# Patient Record
Sex: Female | Born: 1950 | Race: Black or African American | Hispanic: No | Smoking: Current every day smoker
Health system: Southern US, Community
[De-identification: ages and names within clinical notes are randomized; demographics above are authoritative.]

## PROBLEM LIST (undated history)

## (undated) DIAGNOSIS — E039 Hypothyroidism, unspecified: Secondary | ICD-10-CM

## (undated) DIAGNOSIS — M1990 Unspecified osteoarthritis, unspecified site: Secondary | ICD-10-CM

## (undated) DIAGNOSIS — D649 Anemia, unspecified: Secondary | ICD-10-CM

## (undated) DIAGNOSIS — Z8701 Personal history of pneumonia (recurrent): Secondary | ICD-10-CM

## (undated) DIAGNOSIS — J449 Chronic obstructive pulmonary disease, unspecified: Secondary | ICD-10-CM

## (undated) DIAGNOSIS — H269 Unspecified cataract: Secondary | ICD-10-CM

## (undated) DIAGNOSIS — I10 Essential (primary) hypertension: Secondary | ICD-10-CM

## (undated) DIAGNOSIS — K5790 Diverticulosis of intestine, part unspecified, without perforation or abscess without bleeding: Secondary | ICD-10-CM

## (undated) DIAGNOSIS — F329 Major depressive disorder, single episode, unspecified: Secondary | ICD-10-CM

## (undated) DIAGNOSIS — IMO0002 Reserved for concepts with insufficient information to code with codable children: Secondary | ICD-10-CM

## (undated) DIAGNOSIS — I4892 Unspecified atrial flutter: Secondary | ICD-10-CM

## (undated) DIAGNOSIS — K219 Gastro-esophageal reflux disease without esophagitis: Secondary | ICD-10-CM

## (undated) DIAGNOSIS — F32A Depression, unspecified: Secondary | ICD-10-CM

## (undated) DIAGNOSIS — F419 Anxiety disorder, unspecified: Secondary | ICD-10-CM

## (undated) DIAGNOSIS — K648 Other hemorrhoids: Secondary | ICD-10-CM

## (undated) DIAGNOSIS — I4891 Unspecified atrial fibrillation: Secondary | ICD-10-CM

## (undated) HISTORY — DX: Essential (primary) hypertension: I10

## (undated) HISTORY — PX: TUBAL LIGATION: SHX77

## (undated) HISTORY — DX: Chronic obstructive pulmonary disease, unspecified: J44.9

## (undated) HISTORY — PX: HERNIA REPAIR: SHX51

## (undated) HISTORY — DX: Other hemorrhoids: K64.8

## (undated) HISTORY — DX: Personal history of pneumonia (recurrent): Z87.01

## (undated) HISTORY — DX: Depression, unspecified: F32.A

## (undated) HISTORY — DX: Hypothyroidism, unspecified: E03.9

## (undated) HISTORY — DX: Gastro-esophageal reflux disease without esophagitis: K21.9

## (undated) HISTORY — DX: Unspecified osteoarthritis, unspecified site: M19.90

## (undated) HISTORY — DX: Anxiety disorder, unspecified: F41.9

## (undated) HISTORY — DX: Anemia, unspecified: D64.9

## (undated) HISTORY — DX: Diverticulosis of intestine, part unspecified, without perforation or abscess without bleeding: K57.90

## (undated) HISTORY — DX: Major depressive disorder, single episode, unspecified: F32.9

## (undated) HISTORY — PX: CATARACT EXTRACTION W/ INTRAOCULAR LENS IMPLANT: SHX1309

## (undated) HISTORY — PX: PACEMAKER INSERTION: SHX728

## (undated) HISTORY — PX: VAGINAL HYSTERECTOMY: SUR661

---

## 1997-05-25 ENCOUNTER — Ambulatory Visit (HOSPITAL_COMMUNITY): Admission: RE | Admit: 1997-05-25 | Discharge: 1997-05-26 | Payer: Self-pay | Admitting: Surgery

## 1997-07-29 ENCOUNTER — Emergency Department (HOSPITAL_COMMUNITY): Admission: EM | Admit: 1997-07-29 | Discharge: 1997-07-29 | Payer: Self-pay | Admitting: Emergency Medicine

## 1997-12-03 ENCOUNTER — Emergency Department (HOSPITAL_COMMUNITY): Admission: EM | Admit: 1997-12-03 | Discharge: 1997-12-03 | Payer: Self-pay | Admitting: Emergency Medicine

## 1997-12-03 ENCOUNTER — Encounter: Payer: Self-pay | Admitting: Emergency Medicine

## 1998-03-16 ENCOUNTER — Emergency Department (HOSPITAL_COMMUNITY): Admission: EM | Admit: 1998-03-16 | Discharge: 1998-03-16 | Payer: Self-pay | Admitting: Emergency Medicine

## 1998-03-16 ENCOUNTER — Encounter: Payer: Self-pay | Admitting: Emergency Medicine

## 1998-04-23 ENCOUNTER — Emergency Department (HOSPITAL_COMMUNITY): Admission: EM | Admit: 1998-04-23 | Discharge: 1998-04-23 | Payer: Self-pay | Admitting: Emergency Medicine

## 1998-05-19 ENCOUNTER — Emergency Department (HOSPITAL_COMMUNITY): Admission: EM | Admit: 1998-05-19 | Discharge: 1998-05-19 | Payer: Self-pay | Admitting: Emergency Medicine

## 1998-08-16 ENCOUNTER — Emergency Department (HOSPITAL_COMMUNITY): Admission: EM | Admit: 1998-08-16 | Discharge: 1998-08-16 | Payer: Self-pay | Admitting: Emergency Medicine

## 1998-09-06 ENCOUNTER — Emergency Department (HOSPITAL_COMMUNITY): Admission: EM | Admit: 1998-09-06 | Discharge: 1998-09-06 | Payer: Self-pay | Admitting: Internal Medicine

## 1998-09-06 ENCOUNTER — Encounter: Payer: Self-pay | Admitting: Internal Medicine

## 1998-12-08 ENCOUNTER — Emergency Department (HOSPITAL_COMMUNITY): Admission: EM | Admit: 1998-12-08 | Discharge: 1998-12-09 | Payer: Self-pay | Admitting: Emergency Medicine

## 1999-02-28 ENCOUNTER — Emergency Department (HOSPITAL_COMMUNITY): Admission: EM | Admit: 1999-02-28 | Discharge: 1999-02-28 | Payer: Self-pay | Admitting: Emergency Medicine

## 1999-02-28 ENCOUNTER — Encounter: Payer: Self-pay | Admitting: Emergency Medicine

## 1999-06-22 ENCOUNTER — Emergency Department (HOSPITAL_COMMUNITY): Admission: EM | Admit: 1999-06-22 | Discharge: 1999-06-22 | Payer: Self-pay | Admitting: Internal Medicine

## 1999-07-16 ENCOUNTER — Encounter: Payer: Self-pay | Admitting: Emergency Medicine

## 1999-07-16 ENCOUNTER — Emergency Department (HOSPITAL_COMMUNITY): Admission: EM | Admit: 1999-07-16 | Discharge: 1999-07-16 | Payer: Self-pay | Admitting: Emergency Medicine

## 1999-09-03 ENCOUNTER — Emergency Department (HOSPITAL_COMMUNITY): Admission: EM | Admit: 1999-09-03 | Discharge: 1999-09-03 | Payer: Self-pay | Admitting: Emergency Medicine

## 1999-09-03 ENCOUNTER — Encounter: Payer: Self-pay | Admitting: Emergency Medicine

## 2000-03-18 ENCOUNTER — Inpatient Hospital Stay (HOSPITAL_COMMUNITY): Admission: EM | Admit: 2000-03-18 | Discharge: 2000-03-19 | Payer: Self-pay | Admitting: Emergency Medicine

## 2000-03-18 ENCOUNTER — Encounter: Payer: Self-pay | Admitting: Emergency Medicine

## 2000-11-13 ENCOUNTER — Emergency Department (HOSPITAL_COMMUNITY): Admission: EM | Admit: 2000-11-13 | Discharge: 2000-11-14 | Payer: Self-pay | Admitting: Emergency Medicine

## 2000-11-15 ENCOUNTER — Emergency Department (HOSPITAL_COMMUNITY): Admission: EM | Admit: 2000-11-15 | Discharge: 2000-11-15 | Payer: Self-pay

## 2000-12-23 ENCOUNTER — Observation Stay (HOSPITAL_COMMUNITY): Admission: EM | Admit: 2000-12-23 | Discharge: 2000-12-24 | Payer: Self-pay

## 2001-02-24 ENCOUNTER — Emergency Department (HOSPITAL_COMMUNITY): Admission: EM | Admit: 2001-02-24 | Discharge: 2001-02-24 | Payer: Self-pay | Admitting: Emergency Medicine

## 2001-02-24 ENCOUNTER — Encounter: Payer: Self-pay | Admitting: Emergency Medicine

## 2002-07-14 ENCOUNTER — Emergency Department (HOSPITAL_COMMUNITY): Admission: EM | Admit: 2002-07-14 | Discharge: 2002-07-14 | Payer: Self-pay | Admitting: Emergency Medicine

## 2002-07-14 ENCOUNTER — Encounter: Payer: Self-pay | Admitting: Emergency Medicine

## 2002-08-21 ENCOUNTER — Emergency Department (HOSPITAL_COMMUNITY): Admission: EM | Admit: 2002-08-21 | Discharge: 2002-08-21 | Payer: Self-pay | Admitting: Emergency Medicine

## 2002-08-21 ENCOUNTER — Encounter: Payer: Self-pay | Admitting: Emergency Medicine

## 2002-09-29 ENCOUNTER — Emergency Department (HOSPITAL_COMMUNITY): Admission: EM | Admit: 2002-09-29 | Discharge: 2002-09-29 | Payer: Self-pay | Admitting: Emergency Medicine

## 2002-09-29 ENCOUNTER — Encounter: Payer: Self-pay | Admitting: Emergency Medicine

## 2002-10-08 ENCOUNTER — Inpatient Hospital Stay (HOSPITAL_COMMUNITY): Admission: EM | Admit: 2002-10-08 | Discharge: 2002-10-09 | Payer: Self-pay | Admitting: Emergency Medicine

## 2002-10-09 ENCOUNTER — Encounter: Payer: Self-pay | Admitting: Family Medicine

## 2002-10-17 ENCOUNTER — Encounter: Admission: RE | Admit: 2002-10-17 | Discharge: 2002-10-17 | Payer: Self-pay | Admitting: Sports Medicine

## 2003-01-26 ENCOUNTER — Emergency Department (HOSPITAL_COMMUNITY): Admission: EM | Admit: 2003-01-26 | Discharge: 2003-01-27 | Payer: Self-pay | Admitting: Emergency Medicine

## 2003-03-25 ENCOUNTER — Emergency Department (HOSPITAL_COMMUNITY): Admission: EM | Admit: 2003-03-25 | Discharge: 2003-03-25 | Payer: Self-pay | Admitting: Emergency Medicine

## 2003-05-09 ENCOUNTER — Emergency Department (HOSPITAL_COMMUNITY): Admission: EM | Admit: 2003-05-09 | Discharge: 2003-05-09 | Payer: Self-pay | Admitting: Emergency Medicine

## 2003-06-18 ENCOUNTER — Emergency Department (HOSPITAL_COMMUNITY): Admission: EM | Admit: 2003-06-18 | Discharge: 2003-06-18 | Payer: Self-pay | Admitting: Emergency Medicine

## 2003-07-01 ENCOUNTER — Inpatient Hospital Stay (HOSPITAL_COMMUNITY): Admission: EM | Admit: 2003-07-01 | Discharge: 2003-07-01 | Payer: Self-pay | Admitting: *Deleted

## 2003-09-18 ENCOUNTER — Emergency Department (HOSPITAL_COMMUNITY): Admission: EM | Admit: 2003-09-18 | Discharge: 2003-09-18 | Payer: Self-pay | Admitting: Family Medicine

## 2003-11-07 ENCOUNTER — Encounter: Payer: Self-pay | Admitting: Emergency Medicine

## 2003-11-07 ENCOUNTER — Emergency Department (HOSPITAL_COMMUNITY): Admission: EM | Admit: 2003-11-07 | Discharge: 2003-11-07 | Payer: Self-pay | Admitting: Emergency Medicine

## 2004-01-09 ENCOUNTER — Ambulatory Visit: Payer: Self-pay | Admitting: Internal Medicine

## 2004-04-10 ENCOUNTER — Emergency Department (HOSPITAL_COMMUNITY): Admission: EM | Admit: 2004-04-10 | Discharge: 2004-04-10 | Payer: Self-pay | Admitting: Emergency Medicine

## 2004-09-20 ENCOUNTER — Emergency Department (HOSPITAL_COMMUNITY): Admission: EM | Admit: 2004-09-20 | Discharge: 2004-09-21 | Payer: Self-pay | Admitting: Emergency Medicine

## 2005-02-15 ENCOUNTER — Emergency Department (HOSPITAL_COMMUNITY): Admission: EM | Admit: 2005-02-15 | Discharge: 2005-02-15 | Payer: Self-pay | Admitting: *Deleted

## 2005-05-27 ENCOUNTER — Emergency Department (HOSPITAL_COMMUNITY): Admission: EM | Admit: 2005-05-27 | Discharge: 2005-05-27 | Payer: Self-pay | Admitting: Emergency Medicine

## 2005-05-30 ENCOUNTER — Emergency Department (HOSPITAL_COMMUNITY): Admission: EM | Admit: 2005-05-30 | Discharge: 2005-05-30 | Payer: Self-pay | Admitting: Family Medicine

## 2005-07-19 ENCOUNTER — Encounter: Payer: Self-pay | Admitting: Vascular Surgery

## 2005-07-19 ENCOUNTER — Emergency Department (HOSPITAL_COMMUNITY): Admission: EM | Admit: 2005-07-19 | Discharge: 2005-07-19 | Payer: Self-pay | Admitting: Emergency Medicine

## 2005-08-22 ENCOUNTER — Ambulatory Visit: Payer: Self-pay | Admitting: Family Medicine

## 2005-08-25 ENCOUNTER — Ambulatory Visit: Payer: Self-pay | Admitting: *Deleted

## 2005-09-04 ENCOUNTER — Ambulatory Visit: Payer: Self-pay | Admitting: Family Medicine

## 2005-10-07 ENCOUNTER — Observation Stay (HOSPITAL_COMMUNITY): Admission: EM | Admit: 2005-10-07 | Discharge: 2005-10-08 | Payer: Self-pay | Admitting: Emergency Medicine

## 2005-10-07 ENCOUNTER — Ambulatory Visit: Payer: Self-pay | Admitting: Internal Medicine

## 2005-10-08 ENCOUNTER — Ambulatory Visit: Payer: Self-pay | Admitting: Cardiology

## 2005-10-08 ENCOUNTER — Encounter: Payer: Self-pay | Admitting: Cardiology

## 2005-10-21 ENCOUNTER — Ambulatory Visit: Payer: Self-pay | Admitting: Family Medicine

## 2006-02-06 ENCOUNTER — Ambulatory Visit: Payer: Self-pay | Admitting: Internal Medicine

## 2006-03-02 ENCOUNTER — Ambulatory Visit: Payer: Self-pay | Admitting: Internal Medicine

## 2006-03-02 LAB — CONVERTED CEMR LAB
ALT: 14 units/L (ref 0–40)
AST: 17 units/L (ref 0–37)
Albumin: 3.8 g/dL (ref 3.5–5.2)
Alkaline Phosphatase: 76 units/L (ref 39–117)
BUN: 13 mg/dL (ref 6–23)
Basophils Absolute: 0.1 10*3/uL (ref 0.0–0.1)
Basophils Relative: 0.8 % (ref 0.0–1.0)
Bilirubin Urine: NEGATIVE
Bilirubin, Direct: 0.1 mg/dL (ref 0.0–0.3)
CO2: 32 meq/L (ref 19–32)
Calcium: 9.1 mg/dL (ref 8.4–10.5)
Chloride: 101 meq/L (ref 96–112)
Cholesterol: 152 mg/dL (ref 0–200)
Creatinine, Ser: 1 mg/dL (ref 0.4–1.2)
Creatinine,U: 201.9 mg/dL
Eosinophils Absolute: 0.2 10*3/uL (ref 0.0–0.6)
Eosinophils Relative: 2.9 % (ref 0.0–5.0)
GFR calc Af Amer: 74 mL/min
GFR calc non Af Amer: 61 mL/min
Glucose, Bld: 147 mg/dL — ABNORMAL HIGH (ref 70–99)
HCT: 40.3 % (ref 36.0–46.0)
HDL: 46.1 mg/dL (ref >39.0)
Hemoglobin, Urine: NEGATIVE
Hemoglobin: 13.3 g/dL (ref 12.0–15.0)
Hgb A1c MFr Bld: 7.3 % — ABNORMAL HIGH (ref 4.6–6.0)
Ketones, ur: NEGATIVE mg/dL
LDL Cholesterol: 88 mg/dL (ref 0–99)
Leukocytes, UA: NEGATIVE
Lymphocytes Relative: 32.3 % (ref 12.0–46.0)
MCHC: 33.1 g/dL (ref 30.0–36.0)
MCV: 91.3 fL (ref 78.0–100.0)
Microalb Creat Ratio: 15.8 mg/g (ref 0.0–30.0)
Microalb, Ur: 3.2 mg/dL — ABNORMAL HIGH (ref 0.0–1.9)
Monocytes Absolute: 0.5 10*3/uL (ref 0.2–0.7)
Monocytes Relative: 5.5 % (ref 3.0–11.0)
Neutro Abs: 4.9 10*3/uL (ref 1.4–7.7)
Neutrophils Relative %: 58.5 % (ref 43.0–77.0)
Nitrite: NEGATIVE
Platelets: 289 10*3/uL (ref 150–400)
Potassium: 4 meq/L (ref 3.5–5.1)
RBC: 4.41 M/uL (ref 3.87–5.11)
RDW: 14.9 % — ABNORMAL HIGH (ref 11.5–14.6)
Sodium: 140 meq/L (ref 135–145)
Specific Gravity, Urine: 1.02 (ref 1.000–1.03)
TSH: 2.57 microintl units/mL (ref 0.35–5.50)
Total Bilirubin: 0.6 mg/dL (ref 0.3–1.2)
Total CHOL/HDL Ratio: 3.3
Total Protein, Urine: NEGATIVE mg/dL
Total Protein: 7.1 g/dL (ref 6.0–8.3)
Triglycerides: 92 mg/dL (ref 0–149)
Urine Glucose: NEGATIVE mg/dL
Urobilinogen, UA: 0.2 (ref 0.0–1.0)
VLDL: 18 mg/dL (ref 0–40)
WBC: 8.4 10*3/uL (ref 4.5–10.5)
pH: 6 (ref 5.0–8.0)

## 2006-03-06 ENCOUNTER — Ambulatory Visit: Payer: Self-pay | Admitting: Internal Medicine

## 2006-04-06 ENCOUNTER — Ambulatory Visit: Payer: Self-pay | Admitting: Internal Medicine

## 2006-05-05 ENCOUNTER — Ambulatory Visit: Payer: Self-pay | Admitting: Internal Medicine

## 2006-05-21 ENCOUNTER — Ambulatory Visit: Payer: Self-pay | Admitting: Internal Medicine

## 2006-05-21 LAB — CONVERTED CEMR LAB
ALT: 13 units/L (ref 0–40)
AST: 18 units/L (ref 0–37)
Albumin: 4 g/dL (ref 3.5–5.2)
Alkaline Phosphatase: 86 units/L (ref 39–117)
BUN: 9 mg/dL (ref 6–23)
Basophils Absolute: 0 10*3/uL (ref 0.0–0.1)
Basophils Relative: 0.3 % (ref 0.0–1.0)
Bilirubin, Direct: 0.1 mg/dL (ref 0.0–0.3)
CO2: 34 meq/L — ABNORMAL HIGH (ref 19–32)
Calcium: 9.7 mg/dL (ref 8.4–10.5)
Chloride: 103 meq/L (ref 96–112)
Cholesterol: 206 mg/dL (ref 0–200)
Creatinine, Ser: 0.8 mg/dL (ref 0.4–1.2)
Direct LDL: 128.9 mg/dL
Eosinophils Absolute: 0.2 10*3/uL (ref 0.0–0.6)
Eosinophils Relative: 2.3 % (ref 0.0–5.0)
GFR calc Af Amer: 95 mL/min
GFR calc non Af Amer: 79 mL/min
Glucose, Bld: 118 mg/dL — ABNORMAL HIGH (ref 70–99)
HCT: 39.6 % (ref 36.0–46.0)
HDL: 47.7 mg/dL (ref >39.0)
Hemoglobin: 13.3 g/dL (ref 12.0–15.0)
Hgb A1c MFr Bld: 7 %
Hgb A1c MFr Bld: 7 % — ABNORMAL HIGH (ref 4.6–6.0)
Lymphocytes Relative: 28.7 % (ref 12.0–46.0)
MCHC: 33.6 g/dL (ref 30.0–36.0)
MCV: 90.8 fL (ref 78.0–100.0)
Monocytes Absolute: 0.5 10*3/uL (ref 0.2–0.7)
Monocytes Relative: 6.4 % (ref 3.0–11.0)
Neutro Abs: 4.9 10*3/uL (ref 1.4–7.7)
Neutrophils Relative %: 62.3 % (ref 43.0–77.0)
Platelets: 262 10*3/uL (ref 150–400)
Potassium: 4.3 meq/L (ref 3.5–5.1)
RBC: 4.36 M/uL (ref 3.87–5.11)
RDW: 15.5 % — ABNORMAL HIGH (ref 11.5–14.6)
Sodium: 143 meq/L (ref 135–145)
Total Bilirubin: 0.6 mg/dL (ref 0.3–1.2)
Total CHOL/HDL Ratio: 4.3
Total Protein: 7.5 g/dL (ref 6.0–8.3)
Triglycerides: 94 mg/dL (ref 0–149)
VLDL: 19 mg/dL (ref 0–40)
WBC: 7.9 10*3/uL (ref 4.5–10.5)

## 2006-06-02 ENCOUNTER — Ambulatory Visit: Payer: Self-pay | Admitting: Internal Medicine

## 2006-07-01 ENCOUNTER — Ambulatory Visit: Payer: Self-pay | Admitting: Internal Medicine

## 2006-07-29 ENCOUNTER — Ambulatory Visit: Payer: Self-pay | Admitting: Internal Medicine

## 2006-09-05 ENCOUNTER — Emergency Department (HOSPITAL_COMMUNITY): Admission: EM | Admit: 2006-09-05 | Discharge: 2006-09-05 | Payer: Self-pay | Admitting: Emergency Medicine

## 2006-09-16 ENCOUNTER — Ambulatory Visit: Payer: Self-pay | Admitting: Internal Medicine

## 2006-09-17 ENCOUNTER — Encounter: Payer: Self-pay | Admitting: Internal Medicine

## 2006-09-17 DIAGNOSIS — I10 Essential (primary) hypertension: Secondary | ICD-10-CM

## 2006-09-17 DIAGNOSIS — K469 Unspecified abdominal hernia without obstruction or gangrene: Secondary | ICD-10-CM | POA: Insufficient documentation

## 2006-09-17 DIAGNOSIS — G4733 Obstructive sleep apnea (adult) (pediatric): Secondary | ICD-10-CM | POA: Insufficient documentation

## 2006-09-17 DIAGNOSIS — E039 Hypothyroidism, unspecified: Secondary | ICD-10-CM | POA: Insufficient documentation

## 2006-09-17 DIAGNOSIS — Z950 Presence of cardiac pacemaker: Secondary | ICD-10-CM | POA: Insufficient documentation

## 2006-09-17 DIAGNOSIS — M19049 Primary osteoarthritis, unspecified hand: Secondary | ICD-10-CM | POA: Insufficient documentation

## 2006-09-17 DIAGNOSIS — D179 Benign lipomatous neoplasm, unspecified: Secondary | ICD-10-CM | POA: Insufficient documentation

## 2006-09-17 DIAGNOSIS — E1159 Type 2 diabetes mellitus with other circulatory complications: Secondary | ICD-10-CM | POA: Insufficient documentation

## 2006-09-17 DIAGNOSIS — E785 Hyperlipidemia, unspecified: Secondary | ICD-10-CM

## 2006-09-17 DIAGNOSIS — E1169 Type 2 diabetes mellitus with other specified complication: Secondary | ICD-10-CM | POA: Insufficient documentation

## 2006-09-17 DIAGNOSIS — I498 Other specified cardiac arrhythmias: Secondary | ICD-10-CM | POA: Insufficient documentation

## 2006-09-18 ENCOUNTER — Encounter: Payer: Self-pay | Admitting: Internal Medicine

## 2006-09-22 ENCOUNTER — Ambulatory Visit: Payer: Self-pay | Admitting: Internal Medicine

## 2006-10-07 ENCOUNTER — Encounter (INDEPENDENT_AMBULATORY_CARE_PROVIDER_SITE_OTHER): Payer: Self-pay | Admitting: *Deleted

## 2006-11-17 ENCOUNTER — Ambulatory Visit: Payer: Self-pay | Admitting: Internal Medicine

## 2006-12-15 ENCOUNTER — Ambulatory Visit: Payer: Self-pay | Admitting: Internal Medicine

## 2006-12-17 ENCOUNTER — Emergency Department (HOSPITAL_COMMUNITY): Admission: EM | Admit: 2006-12-17 | Discharge: 2006-12-17 | Payer: Self-pay | Admitting: Emergency Medicine

## 2006-12-25 ENCOUNTER — Ambulatory Visit: Payer: Self-pay | Admitting: Internal Medicine

## 2006-12-25 DIAGNOSIS — M79609 Pain in unspecified limb: Secondary | ICD-10-CM | POA: Insufficient documentation

## 2006-12-25 DIAGNOSIS — F329 Major depressive disorder, single episode, unspecified: Secondary | ICD-10-CM | POA: Insufficient documentation

## 2006-12-31 LAB — CONVERTED CEMR LAB
BUN: 15 mg/dL (ref 6–23)
CO2: 34 meq/L — ABNORMAL HIGH (ref 19–32)
Calcium: 9.9 mg/dL (ref 8.4–10.5)
Chloride: 99 meq/L (ref 96–112)
Cholesterol: 198 mg/dL (ref 0–200)
Creatinine, Ser: 1.1 mg/dL (ref 0.4–1.2)
GFR calc Af Amer: 66 mL/min
GFR calc non Af Amer: 55 mL/min
Glucose, Bld: 86 mg/dL (ref 70–99)
HDL: 47 mg/dL (ref >39.0)
Hgb A1c MFr Bld: 7.6 % — ABNORMAL HIGH (ref 4.6–6.0)
LDL Cholesterol: 129 mg/dL — ABNORMAL HIGH (ref 0–99)
Potassium: 3.4 meq/L — ABNORMAL LOW (ref 3.5–5.1)
Sodium: 140 meq/L (ref 135–145)
Total CHOL/HDL Ratio: 4.2
Triglycerides: 110 mg/dL (ref 0–149)
VLDL: 22 mg/dL (ref 0–40)

## 2007-01-08 ENCOUNTER — Telehealth: Payer: Self-pay | Admitting: Internal Medicine

## 2007-01-11 ENCOUNTER — Telehealth: Payer: Self-pay | Admitting: Internal Medicine

## 2007-01-12 ENCOUNTER — Ambulatory Visit: Payer: Self-pay | Admitting: Internal Medicine

## 2007-01-27 ENCOUNTER — Ambulatory Visit: Payer: Self-pay | Admitting: Internal Medicine

## 2007-02-09 ENCOUNTER — Ambulatory Visit: Payer: Self-pay | Admitting: Internal Medicine

## 2007-02-25 ENCOUNTER — Telehealth (INDEPENDENT_AMBULATORY_CARE_PROVIDER_SITE_OTHER): Payer: Self-pay | Admitting: *Deleted

## 2007-03-26 ENCOUNTER — Telehealth: Payer: Self-pay | Admitting: Internal Medicine

## 2007-04-26 ENCOUNTER — Telehealth (INDEPENDENT_AMBULATORY_CARE_PROVIDER_SITE_OTHER): Payer: Self-pay | Admitting: *Deleted

## 2007-05-11 ENCOUNTER — Ambulatory Visit: Payer: Self-pay | Admitting: Internal Medicine

## 2007-05-11 ENCOUNTER — Encounter: Payer: Self-pay | Admitting: Internal Medicine

## 2007-05-14 ENCOUNTER — Encounter: Payer: Self-pay | Admitting: Internal Medicine

## 2007-05-19 ENCOUNTER — Ambulatory Visit: Payer: Self-pay | Admitting: Internal Medicine

## 2007-05-19 LAB — CONVERTED CEMR LAB
BUN: 11 mg/dL (ref 6–23)
Basophils Absolute: 0 10*3/uL (ref 0.0–0.1)
Basophils Relative: 0.3 % (ref 0.0–1.0)
CO2: 33 meq/L — ABNORMAL HIGH (ref 19–32)
Calcium: 9.2 mg/dL (ref 8.4–10.5)
Chloride: 103 meq/L (ref 96–112)
Creatinine, Ser: 0.8 mg/dL (ref 0.4–1.2)
Eosinophils Absolute: 0.2 10*3/uL (ref 0.0–0.7)
Eosinophils Relative: 2.4 % (ref 0.0–5.0)
GFR calc Af Amer: 95 mL/min
GFR calc non Af Amer: 79 mL/min
Glucose, Bld: 108 mg/dL — ABNORMAL HIGH (ref 70–99)
HCT: 39.8 % (ref 36.0–46.0)
Hemoglobin: 12.8 g/dL (ref 12.0–15.0)
INR: 1 (ref 0.8–1.0)
Lymphocytes Relative: 29.7 % (ref 12.0–46.0)
MCHC: 32.1 g/dL (ref 30.0–36.0)
MCV: 92.7 fL (ref 78.0–100.0)
Monocytes Absolute: 0.6 10*3/uL (ref 0.1–1.0)
Monocytes Relative: 7.3 % (ref 3.0–12.0)
Neutro Abs: 5 10*3/uL (ref 1.4–7.7)
Neutrophils Relative %: 60.3 % (ref 43.0–77.0)
Platelets: 251 10*3/uL (ref 150–400)
Potassium: 3.6 meq/L (ref 3.5–5.1)
Prothrombin Time: 12.2 s (ref 10.9–13.3)
RBC: 4.29 M/uL (ref 3.87–5.11)
RDW: 15.1 % — ABNORMAL HIGH (ref 11.5–14.6)
Sodium: 141 meq/L (ref 135–145)
WBC: 8.2 10*3/uL (ref 4.5–10.5)
aPTT: 30.6 s — ABNORMAL HIGH (ref 21.7–29.8)

## 2007-05-25 ENCOUNTER — Ambulatory Visit (HOSPITAL_COMMUNITY): Admission: RE | Admit: 2007-05-25 | Discharge: 2007-05-25 | Payer: Self-pay | Admitting: Internal Medicine

## 2007-05-25 ENCOUNTER — Ambulatory Visit: Payer: Self-pay | Admitting: Internal Medicine

## 2007-06-01 ENCOUNTER — Ambulatory Visit: Payer: Self-pay | Admitting: Internal Medicine

## 2007-06-17 ENCOUNTER — Telehealth (INDEPENDENT_AMBULATORY_CARE_PROVIDER_SITE_OTHER): Payer: Self-pay | Admitting: *Deleted

## 2007-06-17 DIAGNOSIS — M25569 Pain in unspecified knee: Secondary | ICD-10-CM | POA: Insufficient documentation

## 2007-07-27 ENCOUNTER — Encounter: Payer: Self-pay | Admitting: Internal Medicine

## 2007-08-26 ENCOUNTER — Emergency Department (HOSPITAL_COMMUNITY): Admission: EM | Admit: 2007-08-26 | Discharge: 2007-08-27 | Payer: Self-pay | Admitting: Emergency Medicine

## 2007-09-03 ENCOUNTER — Ambulatory Visit: Payer: Self-pay

## 2007-09-10 ENCOUNTER — Encounter: Payer: Self-pay | Admitting: Internal Medicine

## 2007-09-14 ENCOUNTER — Ambulatory Visit: Payer: Self-pay | Admitting: Internal Medicine

## 2007-09-16 ENCOUNTER — Encounter: Payer: Self-pay | Admitting: Internal Medicine

## 2007-11-17 ENCOUNTER — Encounter: Admission: RE | Admit: 2007-11-17 | Discharge: 2007-11-17 | Payer: Self-pay | Admitting: Internal Medicine

## 2007-11-26 ENCOUNTER — Encounter: Admission: RE | Admit: 2007-11-26 | Discharge: 2007-11-26 | Payer: Self-pay | Admitting: Internal Medicine

## 2007-12-17 ENCOUNTER — Telehealth: Payer: Self-pay | Admitting: Internal Medicine

## 2007-12-28 ENCOUNTER — Ambulatory Visit: Payer: Self-pay | Admitting: Vascular Surgery

## 2007-12-28 ENCOUNTER — Encounter (INDEPENDENT_AMBULATORY_CARE_PROVIDER_SITE_OTHER): Payer: Self-pay | Admitting: Emergency Medicine

## 2007-12-28 ENCOUNTER — Emergency Department (HOSPITAL_COMMUNITY): Admission: EM | Admit: 2007-12-28 | Discharge: 2007-12-28 | Payer: Self-pay | Admitting: Emergency Medicine

## 2008-02-13 ENCOUNTER — Emergency Department (HOSPITAL_COMMUNITY): Admission: EM | Admit: 2008-02-13 | Discharge: 2008-02-13 | Payer: Self-pay | Admitting: Emergency Medicine

## 2008-02-16 ENCOUNTER — Ambulatory Visit: Payer: Self-pay | Admitting: Internal Medicine

## 2008-02-16 DIAGNOSIS — R109 Unspecified abdominal pain: Secondary | ICD-10-CM | POA: Insufficient documentation

## 2008-02-17 LAB — CONVERTED CEMR LAB
ALT: 16 units/L (ref 0–35)
AST: 22 units/L (ref 0–37)
Albumin: 3.9 g/dL (ref 3.5–5.2)
Alkaline Phosphatase: 74 units/L (ref 39–117)
BUN: 16 mg/dL (ref 6–23)
Basophils Absolute: 0 10*3/uL (ref 0.0–0.1)
Basophils Relative: 0.4 % (ref 0.0–3.0)
Bilirubin Urine: NEGATIVE
Bilirubin, Direct: 0.1 mg/dL (ref 0.0–0.3)
CO2: 35 meq/L — ABNORMAL HIGH (ref 19–32)
Calcium: 9.7 mg/dL (ref 8.4–10.5)
Chloride: 104 meq/L (ref 96–112)
Cholesterol: 178 mg/dL (ref 0–200)
Creatinine, Ser: 0.9 mg/dL (ref 0.4–1.2)
Creatinine,U: 141.7 mg/dL
Eosinophils Absolute: 0.4 10*3/uL (ref 0.0–0.7)
Eosinophils Relative: 3.8 % (ref 0.0–5.0)
GFR calc Af Amer: 83 mL/min
GFR calc non Af Amer: 68 mL/min
Glucose, Bld: 83 mg/dL (ref 70–99)
HCT: 39.9 % (ref 36.0–46.0)
HDL: 49.3 mg/dL (ref >39.0)
Hemoglobin, Urine: NEGATIVE
Hemoglobin: 13.5 g/dL (ref 12.0–15.0)
Hgb A1c MFr Bld: 6.9 % — ABNORMAL HIGH (ref 4.6–6.0)
Ketones, ur: NEGATIVE mg/dL
LDL Cholesterol: 95 mg/dL (ref 0–99)
Leukocytes, UA: NEGATIVE
Lymphocytes Relative: 36.4 % (ref 12.0–46.0)
MCHC: 33.8 g/dL (ref 30.0–36.0)
MCV: 93.3 fL (ref 78.0–100.0)
Microalb Creat Ratio: 15.5 mg/g (ref 0.0–30.0)
Microalb, Ur: 2.2 mg/dL — ABNORMAL HIGH (ref 0.0–1.9)
Monocytes Absolute: 0.7 10*3/uL (ref 0.1–1.0)
Monocytes Relative: 7.2 % (ref 3.0–12.0)
Neutro Abs: 5.3 10*3/uL (ref 1.4–7.7)
Neutrophils Relative %: 52.2 % (ref 43.0–77.0)
Nitrite: NEGATIVE
Platelets: 244 10*3/uL (ref 150–400)
Potassium: 4 meq/L (ref 3.5–5.1)
RBC: 4.28 M/uL (ref 3.87–5.11)
RDW: 15.1 % — ABNORMAL HIGH (ref 11.5–14.6)
Sodium: 144 meq/L (ref 135–145)
Specific Gravity, Urine: >=1.03 (ref 1.000–1.03)
TSH: 5.63 microintl units/mL — ABNORMAL HIGH (ref 0.35–5.50)
Total Bilirubin: 0.5 mg/dL (ref 0.3–1.2)
Total CHOL/HDL Ratio: 3.6
Total Protein, Urine: NEGATIVE mg/dL
Total Protein: 7.3 g/dL (ref 6.0–8.3)
Triglycerides: 167 mg/dL — ABNORMAL HIGH (ref 0–149)
Urine Glucose: NEGATIVE mg/dL
Urobilinogen, UA: 0.2 (ref 0.0–1.0)
VLDL: 33 mg/dL (ref 0–40)
WBC: 10 10*3/uL (ref 4.5–10.5)
pH: 5 (ref 5.0–8.0)

## 2008-02-18 LAB — CONVERTED CEMR LAB: Vit D, 1,25-Dihydroxy: 21 — ABNORMAL LOW (ref 30–89)

## 2008-05-10 ENCOUNTER — Ambulatory Visit: Payer: Self-pay | Admitting: Internal Medicine

## 2008-05-10 DIAGNOSIS — J209 Acute bronchitis, unspecified: Secondary | ICD-10-CM | POA: Insufficient documentation

## 2008-05-22 ENCOUNTER — Telehealth: Payer: Self-pay | Admitting: Internal Medicine

## 2008-06-19 ENCOUNTER — Encounter (INDEPENDENT_AMBULATORY_CARE_PROVIDER_SITE_OTHER): Payer: Self-pay | Admitting: Emergency Medicine

## 2008-06-19 ENCOUNTER — Ambulatory Visit: Payer: Self-pay | Admitting: Vascular Surgery

## 2008-06-19 ENCOUNTER — Emergency Department (HOSPITAL_COMMUNITY): Admission: EM | Admit: 2008-06-19 | Discharge: 2008-06-19 | Payer: Self-pay | Admitting: Emergency Medicine

## 2008-06-30 ENCOUNTER — Telehealth: Payer: Self-pay | Admitting: Internal Medicine

## 2008-07-10 ENCOUNTER — Ambulatory Visit: Payer: Self-pay | Admitting: Internal Medicine

## 2008-07-10 DIAGNOSIS — M109 Gout, unspecified: Secondary | ICD-10-CM | POA: Insufficient documentation

## 2008-07-10 DIAGNOSIS — M25579 Pain in unspecified ankle and joints of unspecified foot: Secondary | ICD-10-CM | POA: Insufficient documentation

## 2008-07-24 ENCOUNTER — Telehealth: Payer: Self-pay | Admitting: Family Medicine

## 2008-07-25 ENCOUNTER — Ambulatory Visit: Payer: Self-pay | Admitting: Internal Medicine

## 2008-07-31 ENCOUNTER — Telehealth: Payer: Self-pay | Admitting: Internal Medicine

## 2008-08-09 ENCOUNTER — Ambulatory Visit: Payer: Self-pay | Admitting: Internal Medicine

## 2008-08-09 LAB — CONVERTED CEMR LAB
BUN: 13 mg/dL (ref 6–23)
CO2: 35 meq/L — ABNORMAL HIGH (ref 19–32)
Calcium: 9.3 mg/dL (ref 8.4–10.5)
Chloride: 103 meq/L (ref 96–112)
Cholesterol: 158 mg/dL (ref 0–200)
Creatinine, Ser: 0.8 mg/dL (ref 0.4–1.2)
GFR calc non Af Amer: 94.56 mL/min (ref >60)
Glucose, Bld: 109 mg/dL — ABNORMAL HIGH (ref 70–99)
HDL: 55 mg/dL (ref >39.00)
Hgb A1c MFr Bld: 7 % — ABNORMAL HIGH (ref 4.6–6.5)
LDL Cholesterol: 89 mg/dL (ref 0–99)
Potassium: 3.9 meq/L (ref 3.5–5.1)
Sodium: 143 meq/L (ref 135–145)
Total CHOL/HDL Ratio: 3
Triglycerides: 69 mg/dL (ref 0.0–149.0)
VLDL: 13.8 mg/dL (ref 0.0–40.0)

## 2008-08-14 ENCOUNTER — Ambulatory Visit: Payer: Self-pay | Admitting: Internal Medicine

## 2008-08-17 ENCOUNTER — Telehealth (INDEPENDENT_AMBULATORY_CARE_PROVIDER_SITE_OTHER): Payer: Self-pay | Admitting: *Deleted

## 2008-08-17 DIAGNOSIS — K1370 Unspecified lesions of oral mucosa: Secondary | ICD-10-CM | POA: Insufficient documentation

## 2008-08-31 ENCOUNTER — Telehealth (INDEPENDENT_AMBULATORY_CARE_PROVIDER_SITE_OTHER): Payer: Self-pay | Admitting: *Deleted

## 2008-08-31 ENCOUNTER — Telehealth: Payer: Self-pay | Admitting: Internal Medicine

## 2008-09-08 ENCOUNTER — Telehealth: Payer: Self-pay | Admitting: Internal Medicine

## 2008-09-21 ENCOUNTER — Telehealth: Payer: Self-pay | Admitting: Internal Medicine

## 2008-10-20 ENCOUNTER — Telehealth: Payer: Self-pay | Admitting: Internal Medicine

## 2008-11-22 ENCOUNTER — Ambulatory Visit: Payer: Self-pay | Admitting: Internal Medicine

## 2008-11-29 ENCOUNTER — Encounter: Payer: Self-pay | Admitting: Internal Medicine

## 2008-12-01 ENCOUNTER — Emergency Department (HOSPITAL_COMMUNITY): Admission: EM | Admit: 2008-12-01 | Discharge: 2008-12-01 | Payer: Self-pay | Admitting: Emergency Medicine

## 2008-12-26 ENCOUNTER — Emergency Department (HOSPITAL_COMMUNITY): Admission: EM | Admit: 2008-12-26 | Discharge: 2008-12-27 | Payer: Self-pay | Admitting: Emergency Medicine

## 2009-05-03 ENCOUNTER — Ambulatory Visit: Payer: Self-pay | Admitting: Internal Medicine

## 2009-09-04 ENCOUNTER — Telehealth (INDEPENDENT_AMBULATORY_CARE_PROVIDER_SITE_OTHER): Payer: Self-pay | Admitting: *Deleted

## 2009-09-22 ENCOUNTER — Emergency Department (HOSPITAL_COMMUNITY): Admission: EM | Admit: 2009-09-22 | Discharge: 2009-09-22 | Payer: Self-pay | Admitting: Emergency Medicine

## 2009-09-27 ENCOUNTER — Emergency Department (HOSPITAL_COMMUNITY)
Admission: EM | Admit: 2009-09-27 | Discharge: 2009-09-27 | Payer: Self-pay | Source: Home / Self Care | Admitting: Emergency Medicine

## 2009-10-23 ENCOUNTER — Telehealth: Payer: Self-pay | Admitting: Internal Medicine

## 2009-11-12 ENCOUNTER — Telehealth: Payer: Self-pay | Admitting: Internal Medicine

## 2009-12-28 ENCOUNTER — Telehealth: Payer: Self-pay | Admitting: Internal Medicine

## 2010-01-19 ENCOUNTER — Emergency Department (HOSPITAL_COMMUNITY)
Admission: EM | Admit: 2010-01-19 | Discharge: 2010-01-19 | Payer: Self-pay | Source: Home / Self Care | Admitting: Emergency Medicine

## 2010-01-22 ENCOUNTER — Telehealth: Payer: Self-pay | Admitting: Internal Medicine

## 2010-02-10 ENCOUNTER — Encounter: Payer: Self-pay | Admitting: Internal Medicine

## 2010-02-17 ENCOUNTER — Emergency Department (HOSPITAL_COMMUNITY)
Admission: EM | Admit: 2010-02-17 | Discharge: 2010-02-17 | Payer: Self-pay | Source: Home / Self Care | Admitting: Emergency Medicine

## 2010-02-19 NOTE — Assessment & Plan Note (Signed)
Summary: rov/ pt has no insurance / gd   Visit Type:  Follow-up Primary Provider:  Corwin Levins MD   History of Present Illness: [name] returns today followup.  She is a very pleasant middle-aged woman with a history of symptomatic bradycardia and high-grade heart block who is status post pacemaker insertion.  She returns today for followup. She had initially experienced pain at her insertion site which has resolved.  She now notes occaisional palpitations and also some stiffness and swelling in her joints in her hands, fingers and feet which improves as the day goes along.  She has not tolerated any NSAIDS.  Current Medications (verified): 1)  Carvedilol 25 Mg Tabs (Carvedilol) .Marland Kitchen.. 1po Two Times A Day/ Out 2)  Glimepiride 4 Mg Tabs (Glimepiride) .... Take 1 Tablet By Mouth Twice A Day 3)  Lisinopril-Hydrochlorothiazide 20-12.5 Mg Tabs (Lisinopril-Hydrochlorothiazide) .... Take 2 Tablet By Mouth Once A Day 4)  Pravastatin Sodium 40 Mg Tabs (Pravastatin Sodium) .... Take 2 Tablet By Mouth Once A Day 5)  Sertraline Hcl 100 Mg  Tabs (Sertraline Hcl) .Marland Kitchen.. 1 and 1/2 By Mouth Once Daily 6)  Onetouch Test   Strp (Glucose Blood) .... Use As Directed 7)  Onetouch Finepoint Lancets   Misc (Lancets) .... Use As Directed 8)  Metformin Hcl 500 Mg  Tabs (Metformin Hcl) .Marland Kitchen.. 1 By Mouth Two Times A Day 9)  Clonidine Hcl 0.1 Mg Tabs (Clonidine Hcl) .... One Tab Twice A Day 10)  Klor-Con M20 20 Meq Cr-Tabs (Potassium Chloride Crys Cr) .... Take 1 Two Times A Day  Allergies: 1)  ! Penicillin V Potassium (Penicillin V Potassium) 2)  ! Nsaids 3)  ! Darvocet-N 100 4)  ! Septra Ds (Sulfamethoxazole-Trimethoprim) 5)  ! Vicodin 6)  Codeine Phosphate (Codeine Phosphate) 7)  Naprosyn (Naproxen)  Past History:  Past Medical History: Last updated: 07/10/2008 ABDOMINAL PAIN, UNSPECIFIED SITE (ICD-789.00) PREVENTIVE HEALTH CARE (ICD-V70.0) KNEE PAIN, BILATERAL (ICD-719.46) LEG PAIN, BILATERAL  (ICD-729.5) DEPRESSIVE DISORDER (ICD-311) THUMB PAIN (ICD-729.5) HERNIA, SITE NOS W/O OBSTRUCTION/GANGRENE (ICD-553.9) LIPOMA NOS (ICD-214.9) DEGENERATIVE JOINT DISEASE, HANDS (ICD-715.94) OBSTRUCTIVE SLEEP APNEA (ICD-327.23) HYPERLIPIDEMIA (ICD-272.4) HYPOTHYROIDISM (ICD-244.9) PACEMAKER, PERMANENT (ICD-V45.01) BRADYCARDIA, CHRONIC (ICD-427.89) HYPERTENSION (ICD-401.9) DIABETES MELLITUS, TYPE II (ICD-250.00) Gout  Past Surgical History: Last updated: 04/22/2008 Hysterectomy hernia repair '73  Removal of previous implanted pacemaker, which is   a ERI, insertion of a new dual-chamber pacemaker with pacemaker pocket   revision.  05/25/2007  Doylene Canning. Ladona Ridgel, MD   Review of Systems  The patient denies chest pain, syncope, and dyspnea on exertion.    Vital Signs:  Patient profile:   60 year old female Height:      65 inches Weight:      229 pounds O2 Sat:      97 % Pulse rate:   77 / minute BP sitting:   124 / 88  (left arm)  Vitals Entered By: Laurance Flatten CMA (May 03, 2009 11:27 AM) Comments Pt has been out of carvedilol for a month., due to a mix-up at her pharmacy.   Physical Exam  General:  alert and overweight-appearing.   Head:  normocephalic and atraumatic.   Eyes:  vision grossly intact, pupils equal, and pupils round.   Mouth:  no gingival abnormalities and pharynx pink and moist.   Neck:  supple and no masses.   Chest Wall:  Well healed PPM incision. Lungs:  normal respiratory effort and normal breath sounds.   Heart:  normal rate and regular  rhythm.   Abdomen:  Bowel sounds positive,abdomen soft and non-tender without masses, organomegaly or hernias noted. Msk:  right ankle and medial foot with marked tender and swelling to the left lat mallealar area, also the tarsal tunnel area Pulses:  1+ bilat dorsalis pedis Extremities:  no edema, no erythema  Neurologic:  strength normal in all extremities and sensation intact to light touch.     PPM  Specifications Following MD:  Lewayne Bunting, MD     PPM Vendor:  Medtronic     PPM Model Number:  [id number]     PPM Serial Number:  [id number] PPM DOI:  05/25/2007     PPM Implanting MD:  Lewayne Bunting, MD  Lead 1    Location: RA     DOI: 05/25/1997     Model #: [id number]     Serial #: [id number]     Status: active Lead 2    Location: RV     DOI: 05/25/1997     Model #: [id number]     Serial #: [id number]     Status: active  Magnet Response Rate:  BOL 85 ERI 65   Indications:  BRADYCARDIA   PPM Follow Up Remote Check?  No Battery Voltage:  2.79 V     Battery Est. Longevity:  14 YEARS     Pacer Dependent:  No       PPM Device Measurements Atrium  Amplitude: 2.8 mV, Impedance: 621 ohms, Threshold: 1.125 V at 0.4 msec Right Ventricle  Amplitude: 22.4 mV, Impedance: 662 ohms, Threshold: 0.875 V at 0.4 msec  Episodes MS Episodes:  263     Percent Mode Switch:  <0.1%     Coumadin:  No Ventricular High Rate:  1     Atrial Pacing:  15.4%     Ventricular Pacing:  0.6%  Parameters Mode:  MVP(R)     Lower Rate Limit:  60     Upper Rate Limit:  130 Paced AV Delay:  150     Sensed AV Delay:  120 Next Cardiology Appt Due:  10/20/2009 Tech Comments:  Normal device function.  No changes made today.  All mode switch episodes less than 1 minute except for 2- these are likely SVT with 1:1 conduction.  VHR episode is run of NSVT.  ROV 6 months clinic. Gypsy Balsam RN BSN  May 03, 2009 11:38 AM  MD Comments:  Agree with above.  Impression & Recommendations:  Problem # 1:  PACEMAKER, PERMANENT (ICD-V45.01) Her device is working normally.  Will recheck in several months.  Problem # 2:  HYPERTENSION (ICD-401.9) She has been off of her beta blocker which we will restart today.  Continue meds as below and maintain a low sodium diet. Her updated medication list for this problem includes:    Carvedilol 25 Mg Tabs (Carvedilol) .Marland Kitchen... 1po two times a day/ out    Lisinopril-hydrochlorothiazide 20-12.5 Mg Tabs  (Lisinopril-hydrochlorothiazide) .Marland Kitchen... Take 2 tablet by mouth once a day    Clonidine Hcl 0.1 Mg Tabs (Clonidine hcl) ..... One tab twice a day  Problem # 3:  HYPERLIPIDEMIA (ICD-272.4) Continue a low fat diet.  Continue current meds. Her updated medication list for this problem includes:    Pravastatin Sodium 40 Mg Tabs (Pravastatin sodium) .Marland Kitchen... Take 2 tablet by mouth once a day  Patient Instructions: 1)  Your physician wants you to follow-up in: 6 months with device clinic.  You will receive a reminder letter in the mail two months in  advance. If you don't receive a letter, please call our office to schedule the follow-up appointment. Prescriptions: CARVEDILOL 25 MG TABS (CARVEDILOL) 1po two times a day/ out  #60 x 11   Entered by:   Proofreader   Authorized by:   Laren Boom, MD, [facility]   Signed by:   Gypsy Balsam RN BSN on 05/03/2009   Method used:   Electronically to        [facility] [phone]* (retail)       [address]       [place], [place]  [address]       Ph: [phone]       Fax: [phone]   RxID:   [id number]

## 2010-02-19 NOTE — Miscellaneous (Signed)
Summary: DEVICE PRELOAD  Clinical Lists Changes  Observations: Added new observation of PPM INDICATN: BRADYCARDIA (02/16/2008 12:23) Added new observation of MAGNET RTE: BOL 85 ERI 65  (02/16/2008 12:23) Added new observation of PPMLEADSTAT2: active (02/16/2008 12:23) Added new observation of PPMLEADSER2: [id number] (02/16/2008 12:23) Added new observation of PPMLEADMOD2: 5092  (02/16/2008 12:23) Added new observation of PPMLEADDOI2: 05/25/1997  (02/16/2008 12:23) Added new observation of PPMLEADLOC2: RV  (02/16/2008 12:23) Added new observation of PPMLEADSTAT1: active  (02/16/2008 12:23) Added new observation of PPMLEADSER1: [id number] V  (02/16/2008 12:23) Added new observation of PPMLEADMOD1: 4068  (02/16/2008 12:23) Added new observation of PPMLEADDOI1: 05/25/1997  (02/16/2008 12:23) Added new observation of PPMLEADLOC1: RA  (02/16/2008 12:23) Added new observation of PPM IMP MD: Lewayne Bunting, MD  (02/16/2008 12:23) Added new observation of PPM DOI: 05/25/2007  (02/16/2008 12:23) Added new observation of PPM SERL#: [id number]  (02/16/2008 12:23) Added new observation of PPM MODL#: ADDRL1  (02/16/2008 29:52) Added new observation of PACEMAKERMFG: Medtronic  (02/16/2008 12:23) Added new observation of PACEMAKER MD: Lewayne Bunting, MD  (02/16/2008 12:23)      PPM Specifications Following MD:  Lewayne Bunting, MD     PPM Vendor:  Medtronic     PPM Model Number:  ADDRL1     PPM Serial Number:  [id number] PPM DOI:  05/25/2007     PPM Implanting MD:  Lewayne Bunting, MD   Lead 1    Location: RA     DOI: 05/25/1997     Model #: [id number]     Serial #: [id number]     Status: active Lead 2    Location: RV     DOI: 05/25/1997     Model #: [id number]     Serial #: [id number]     Status: active  Magnet Response Rate:  BOL 85 ERI 65   Indications:  BRADYCARDIA

## 2010-02-19 NOTE — Progress Notes (Signed)
Summary: setraline hcl  Phone Note Refill Request Message from:  Fax from Pharmacy on December 17, 2007 8:08 AM  Refills Requested: Medication #1:  SERTRALINE HCL 100 MG  TABS 1 and 1/2 by mouth qd   Dosage confirmed as above?Dosage Confirmed   Brand Name Necessary? No   Last Refilled: 11/16/2007   Notes: 45tabs x 11 Next Appointment Scheduled: non Initial call taken by: Windell Norfolk,  December 17, 2007 8:08 AM    New/Updated Medications: SERTRALINE HCL 100 MG  TABS (SERTRALINE HCL) 1 and 1/2 by mouth once daily   Prescriptions: SERTRALINE HCL 100 MG  TABS (SERTRALINE HCL) 1 and 1/2 by mouth once daily  #45 x 5   Entered and Authorized by:   Corwin Levins MD   Signed by:   Corwin Levins MD on 12/17/2007   Method used:   Electronically to        [facility]* (retail)       [address]       [place], [place]  [address]       Ph: [phone]       Fax: [phone]   RxID:   [id number]

## 2010-02-19 NOTE — Progress Notes (Signed)
Summary: PERCOCET rx//new f/u  Phone Note Call from Patient Call back at Home Phone [phone]   Caller: Patient/[phone] Call For: dr Jonny Ruiz Summary of Call: per pt call need a refill on medication PERCOCET 10-325 MG  TABS (OXYCODONE-ACETAMINOPHEN) 1po qid prn Initial call taken by: Shelbie Proctor,  Jun 17, 2007 9:09 AM  Follow-up for Phone Call        knee films were nearly normal last visit - will change percocet to tramadol for now and refer to ortho - rx done escript Follow-up by: Corwin Levins MD,  Jun 17, 2007 9:58 AM  Additional Follow-up for Phone Call Additional follow up Details #1::        Jun 17, 2007 10:44 AM called pt t to inform .  pt states she cant take the tramadol mentioned this at the last ov thats why dr Jonny Ruiz prescribe the percocet Jun 17, 2007 11:03 AM pt called back and also stated the pain meds were for her hands the referral to the to orth will it be for for the pain in her hands as well?  Additional Follow-up by: Shelbie Proctor,  Jun 17, 2007 10:45 AM  New Problems: KNEE PAIN, BILATERAL (ICD-719.46)   Additional Follow-up for Phone Call Additional follow up Details #2::    no - percocet is not approp for her type of pain in the knees; needs to see ortho only for the knees Follow-up by: Corwin Levins MD,  Jun 17, 2007 11:26 AM  Additional Follow-up for Phone Call Additional follow up Details #3:: Details for Additional Follow-up Action Taken: Jun 17, 2007 11:46 AM called pt to inform pt made aware  Jun 17, 2007 3:51 PM pt wanted to be refered to another  ortho dr , pt owe money was told to handle her bill with the orth dr before appt to be made  Additional Follow-up by: Shelbie Proctor,  Jun 17, 2007 11:46 AM  New Problems: KNEE PAIN, BILATERAL (ICD-719.46) New/Updated Medications: TRAMADOL HCL 50 MG  TABS (TRAMADOL HCL) 1 -2 by mouth q 6 hrs as needed pain   Prescriptions: TRAMADOL HCL 50 MG  TABS (TRAMADOL HCL) 1 -2 by mouth q 6 hrs as needed  pain  #240 x 2   Entered and Authorized by:   Corwin Levins MD   Signed by:   Corwin Levins MD on 06/17/2007   Method used:   Electronically sent to ...       [address]*       [address]       [place], [place]  [address]       Ph: [phone]       Fax: [phone]   RxID:   [id number]

## 2010-02-19 NOTE — Letter (Signed)
Summary: Physicians Orders for Implantable Device Insertion  Physicians Orders for Implantable Device Insertion   Imported By: Maryln Gottron 05/25/2007 14:07:04  _____________________________________________________________________  External Attachment:    Type:   Image     Comment:   External Document

## 2010-02-19 NOTE — Progress Notes (Signed)
Summary: klor con  Phone Note Call from Patient   Caller: Patient Summary of Call: Was seen in ER on 06/19/08. Potassium was low they rx a 10 day supply until she have f/u appt with Dr. Jonny Ruiz. Pt states due to financial reason she have'nt schedule f/u/ yet. she is out of med              Initial call taken by: Orlan Leavens,  June 30, 2008 1:17 PM  Follow-up for Phone Call        Called pt back need f/u appt with dr. Jonny Ruiz. need to have potassium check. can only call in enough pill until appt. Pt made appt fo 07/10/08. [facility] Follow-up by: Orlan Leavens,  June 30, 2008 2:49 PM    New/Updated Medications: KLOR-CON M20 20 MEQ CR-TABS (POTASSIUM CHLORIDE CRYS CR) take 1 two times a day   Prescriptions: KLOR-CON M20 20 MEQ CR-TABS (POTASSIUM CHLORIDE CRYS CR) take 1 two times a day  #60 x 0   Entered by:   Orlan Leavens   Authorized by:   Corwin Levins MD   Signed by:   Orlan Leavens on 06/30/2008   Method used:   Electronically to        [facility] [address] [phone]* (retail)       [address]       [place], [place]  [address]       Ph: [phone] or [phone]       Fax: [phone]   RxID:   [id number]

## 2010-02-19 NOTE — Progress Notes (Signed)
Summary: RF  Phone Note Refill Request Call back at Home Phone [phone]   Refills Requested: Medication #1:  OXYCODONE HCL 5 MG TABS 1 by mouth a 6 hrs as needed pain - to fill august 15 Initial call taken by: Lamar Sprinkles, CMA,  September 21, 2008 4:26 PM  Follow-up for Phone Call        done hardcopy to LIM side B - dahlia  Follow-up by: Corwin Levins MD,  September 21, 2008 4:31 PM  Additional Follow-up for Phone Call Additional follow up Details #1::        pt aware rx up front to pick up Additional Follow-up by: Margaret Pyle, CMA,  September 21, 2008 4:42 PM    New/Updated Medications: OXYCODONE HCL 5 MG TABS (OXYCODONE HCL) 1 by mouth a 6 hrs as needed pain - to fill sept 3, 2010 Prescriptions: OXYCODONE HCL 5 MG TABS (OXYCODONE HCL) 1 by mouth a 6 hrs as needed pain - to fill sept 3, 2010  #120 x 0   Entered and Authorized by:   Corwin Levins MD   Signed by:   Corwin Levins MD on 09/21/2008   Method used:   Print then Give to Patient   RxID:   [id number]

## 2010-02-19 NOTE — Assessment & Plan Note (Signed)
Summary: FU ON MEDS/GOUT? /NWS/ PER TRIAGE   Vital Signs:  Patient profile:   60 year old female Height:      65.5 inches Weight:      240.56 pounds O2 Sat:      96 % Temp:     98.6 degrees F oral Pulse rate:   76 / minute BP sitting:   164 / 100  (left arm) Cuff size:   large  Vitals Entered By: Windell Norfolk (July 10, 2008 4:48 PM) CC: swollen right foot   CC:  swollen right foot.  History of Present Illness: here with 2 wks onset right ankle pain without obvious trauma or twisting; pain quite severe to stand and assoc with moderate swelling some involving the dorsum of the foot as well, thankfully has been able to work sitting for the most part instead of the usual standing, denies fever, but quite tender as well; was seen in the ER with normal uric acid and neg LE venous dopplers, tx wtih narcotic pills that have now run out; does have hx of gout in the big toe years ago; overal denies polys or low sugars, good compliance with meds, no CP, sob, doe, wheezing, orhtopnea, pnd or LE edema  Problems Prior to Update: 1)  Lipoma  (ICD-214.9) 2)  Ankle Pain, Right  (ICD-719.47) 3)  Gout  (ICD-274.9) 4)  Asthmatic Bronchitis, Acute  (ICD-466.0) 5)  Abdominal Pain, Unspecified Site  (ICD-789.00) 6)  Preventive Health Care  (ICD-V70.0) 7)  Knee Pain, Bilateral  (ICD-719.46) 8)  Leg Pain, Bilateral  (ICD-729.5) 9)  Depressive Disorder  (ICD-311) 10)  Thumb Pain  (ICD-729.5) 11)  Hernia, Site Nos w/o Obstruction/gangrene  (ICD-553.9) 12)  Lipoma Nos  (ICD-214.9) 13)  Degenerative Joint Disease, Hands  (ICD-715.94) 14)  Obstructive Sleep Apnea  (ICD-327.23) 15)  Hyperlipidemia  (ICD-272.4) 16)  Hypothyroidism  (ICD-244.9) 17)  Pacemaker, Permanent  (ICD-V45.01) 18)  Bradycardia, Chronic  (ICD-427.89) 19)  Hypertension  (ICD-401.9) 20)  Diabetes Mellitus, Type II  (ICD-250.00)  Medications Prior to Update: 1)  Carvedilol 6.25 Mg Tabs (Carvedilol) .... Take 1 Tablet By Mouth Twice A  Day 2)  Glimepiride 4 Mg Tabs (Glimepiride) .... Take 1 Tablet By Mouth Twice A Day 3)  Levothyroxine Sodium 50 Mcg Tabs (Levothyroxine Sodium) .... Take 1 Tablet By Mouth Once A Day 4)  Lisinopril-Hydrochlorothiazide 20-12.5 Mg Tabs (Lisinopril-Hydrochlorothiazide) .... Take 2 Tablet By Mouth Once A Day 5)  Pravastatin Sodium 40 Mg Tabs (Pravastatin Sodium) .... Take 2 Tablet By Mouth Once A Day 6)  Sertraline Hcl 100 Mg  Tabs (Sertraline Hcl) .Marland Kitchen.. 1 and 1/2 By Mouth Once Daily 7)  Onetouch Test   Strp (Glucose Blood) .... Use As Directed 8)  Onetouch Finepoint Lancets   Misc (Lancets) .... Use As Directed 9)  Metformin Hcl 500 Mg  Tabs (Metformin Hcl) .Marland Kitchen.. 1 By Mouth Two Times A Day 10)  Clonidine Hcl 0.1 Mg Tabs (Clonidine Hcl) .... One Tab Twice A Day 11)  Azithromycin 250 Mg Tabs (Azithromycin) .... 2po Qd For 1 Day, Then 1po Qd For 4days, Then Stop 12)  Promethazine-Codeine 6.25-10 Mg/52ml Syrp (Promethazine-Codeine) .Marland Kitchen.. 1 Tsp By Mouth Q 6 Hrs As Needed Cough 13)  Prednisone 10 Mg Tabs (Prednisone) .... 3po Qd For 3days, Then 2po Qd For 3days, Then 1po Qd For 3days, Then Stop 14)  Proair Hfa 108 (90 Base) Mcg/act Aers (Albuterol Sulfate) .... 2 Puffs Four Times Per Day As Needed For Shortness of Breath  15)  Klor-Con M20 20 Meq Cr-Tabs (Potassium Chloride Crys Cr) .... Take 1 Two Times A Day  Current Medications (verified): 1)  Carvedilol 6.25 Mg Tabs (Carvedilol) .... Take 1 Tablet By Mouth Twice A Day 2)  Glimepiride 4 Mg Tabs (Glimepiride) .... Take 1 Tablet By Mouth Twice A Day 3)  Levothyroxine Sodium 50 Mcg Tabs (Levothyroxine Sodium) .... Take 1 Tablet By Mouth Once A Day 4)  Lisinopril-Hydrochlorothiazide 20-12.5 Mg Tabs (Lisinopril-Hydrochlorothiazide) .... Take 2 Tablet By Mouth Once A Day 5)  Pravastatin Sodium 40 Mg Tabs (Pravastatin Sodium) .... Take 2 Tablet By Mouth Once A Day 6)  Sertraline Hcl 100 Mg  Tabs (Sertraline Hcl) .Marland Kitchen.. 1 and 1/2 By Mouth Once Daily 7)  Onetouch  Test   Strp (Glucose Blood) .... Use As Directed 8)  Onetouch Finepoint Lancets   Misc (Lancets) .... Use As Directed 9)  Metformin Hcl 500 Mg  Tabs (Metformin Hcl) .Marland Kitchen.. 1 By Mouth Two Times A Day 10)  Clonidine Hcl 0.1 Mg Tabs (Clonidine Hcl) .... One Tab Twice A Day 11)  Klor-Con M20 20 Meq Cr-Tabs (Potassium Chloride Crys Cr) .... Take 1 Two Times A Day 12)  Prednisone 10 Mg Tabs (Prednisone) .... 4po Qd For 3days, Then 3po Qd For 3days, Then 2po Qd For 3days, Then 1po Qd For 3 Days, Then Stop 13)  Oxycodone Hcl 5 Mg Tabs (Oxycodone Hcl) .Marland Kitchen.. 1 By Mouth A 6 Hrs As Needed Pain 14)  Indomethacin 50 Mg Caps (Indomethacin) .Marland Kitchen.. 1 By Mouth Three Times A Day As Needed  Allergies (verified): 1)  ! Penicillin V Potassium (Penicillin V Potassium) 2)  ! Nsaids 3)  ! Darvocet-N 100 (Propoxyphene N-Apap) 4)  ! Septra Ds (Sulfamethoxazole-Trimethoprim) 5)  ! Vicodin 6)  Codeine Phosphate (Codeine Phosphate) 7)  Naprosyn (Naproxen)  Past History:  Past Surgical History: Last updated: 04/22/2008 Hysterectomy hernia repair '73  Removal of previous implanted pacemaker, which is   a ERI, insertion of a new dual-chamber pacemaker with pacemaker pocket   revision.  05/25/2007  Doylene Canning. 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  Risk Factors: Smoking Status: current (01/27/2007)  Past Medical History: ABDOMINAL PAIN, UNSPECIFIED SITE (ICD-789.00) PREVENTIVE HEALTH CARE (ICD-V70.0) KNEE PAIN, BILATERAL (ICD-719.46) LEG PAIN, BILATERAL (ICD-729.5) DEPRESSIVE DISORDER (ICD-311) THUMB PAIN (ICD-729.5) HERNIA, SITE NOS W/O OBSTRUCTION/GANGRENE (ICD-553.9) LIPOMA NOS (ICD-214.9) DEGENERATIVE JOINT DISEASE, HANDS (ICD-715.94) OBSTRUCTIVE SLEEP APNEA (ICD-327.23) HYPERLIPIDEMIA (ICD-272.4) HYPOTHYROIDISM (ICD-244.9) PACEMAKER, PERMANENT (ICD-V45.01) BRADYCARDIA, CHRONIC (ICD-427.89) HYPERTENSION (ICD-401.9) DIABETES  MELLITUS, TYPE II (ICD-250.00) Gout  Review of Systems       all otherwise negative except for lump to post neck becoming larger but nontender  Physical Exam  General:  alert and overweight-appearing.   Head:  normocephalic and atraumatic.   Eyes:  vision grossly intact, pupils equal, and pupils round.   Ears:  R ear normal and L ear normal.   Nose:  no external deformity and no nasal discharge.   Mouth:  no gingival abnormalities and pharynx pink and moist.   Neck:  supple and no masses.   Lungs:  normal respiratory effort and normal breath sounds.   Heart:  normal rate and regular rhythm.   Msk:  righ ankle with mod tedner effusion of the right ankle with some slight overall decreased ROM, no significant erythema, no ulceration; some sweling involves the dorsum of the right foot only; dorsalis pedis 1+ bilat Extremities:  no edema, no ulcers  except for the above Skin:  large lipoma like structure to post neck base approx 6 cm   Impression & Recommendations:  Problem # 1:  ANKLE PAIN, RIGHT (ICD-719.47)  with effusion - diff includes gout vs fracture vs djd - tx with depo shot, short term prednisone, check xray, and indocin as needed future attacks; has job where she can sit to take wt off  Orders: T-Ankle Comp Right (73610TC)  Problem # 2:  DIABETES MELLITUS, TYPE II (ICD-250.00)  Her updated medication list for this problem includes:    Glimepiride 4 Mg Tabs (Glimepiride) .Marland Kitchen... Take 1 tablet by mouth twice a day    Lisinopril-hydrochlorothiazide 20-12.5 Mg Tabs (Lisinopril-hydrochlorothiazide) .Marland Kitchen... Take 2 tablet by mouth once a day    Metformin Hcl 500 Mg Tabs (Metformin hcl) .Marland Kitchen... 1 by mouth two times a day  Labs Reviewed: Creat: 0.9 (02/16/2008)    Reviewed HgBA1c results: 6.9 (02/16/2008)  7.6 (12/25/2006) pt is warned of the blood sugars becoming mildly elvated due to tx, to call for cbg > 250  Problem # 3:  HYPERTENSION (ICD-401.9)  Her updated medication list  for this problem includes:    Carvedilol 6.25 Mg Tabs (Carvedilol) .Marland Kitchen... Take 1 tablet by mouth twice a day    Lisinopril-hydrochlorothiazide 20-12.5 Mg Tabs (Lisinopril-hydrochlorothiazide) .Marland Kitchen... Take 2 tablet by mouth once a day    Clonidine Hcl 0.1 Mg Tabs (Clonidine hcl) ..... One tab twice a day mild elev likely situatinoal, ok to follow  Problem # 4:  LIPOMA (ICD-214.9)  large post cervical at base of neck - refer gen surgury  Orders: Surgical Referral (Surgery)  Complete Medication List: 1)  Carvedilol 6.25 Mg Tabs (Carvedilol) .... Take 1 tablet by mouth twice a day 2)  Glimepiride 4 Mg Tabs (Glimepiride) .... Take 1 tablet by mouth twice a day 3)  Levothyroxine Sodium 50 Mcg Tabs (Levothyroxine sodium) .... Take 1 tablet by mouth once a day 4)  Lisinopril-hydrochlorothiazide 20-12.5 Mg Tabs (Lisinopril-hydrochlorothiazide) .... Take 2 tablet by mouth once a day 5)  Pravastatin Sodium 40 Mg Tabs (Pravastatin sodium) .... Take 2 tablet by mouth once a day 6)  Sertraline Hcl 100 Mg Tabs (Sertraline hcl) .Marland Kitchen.. 1 and 1/2 by mouth once daily 7)  Onetouch Test Strp (Glucose blood) .... Use as directed 8)  Onetouch Finepoint Lancets Misc (Lancets) .... Use as directed 9)  Metformin Hcl 500 Mg Tabs (Metformin hcl) .Marland Kitchen.. 1 by mouth two times a day 10)  Clonidine Hcl 0.1 Mg Tabs (Clonidine hcl) .... One tab twice a day 11)  Klor-con M20 20 Meq Cr-tabs (Potassium chloride crys cr) .... Take 1 two times a day 12)  Prednisone 10 Mg Tabs (Prednisone) .... 4po qd for 3days, then 3po qd for 3days, then 2po qd for 3days, then 1po qd for 3 days, then stop 13)  Oxycodone Hcl 5 Mg Tabs (Oxycodone hcl) .Marland Kitchen.. 1 by mouth a 6 hrs as needed pain 14)  Indomethacin 50 Mg Caps (Indomethacin) .Marland Kitchen.. 1 by mouth three times a day as needed  Other Orders: Admin of Therapeutic Inj  intramuscular or subcutaneous (62952) Depo- Medrol 80mg  (J1040)  Patient Instructions: 1)  you had the steroid shot today 2)   Please take all new medications as prescribed, except only use the indomethacin for FUTURE attacks (not this one) 3)  Please go to Radiology in the basement level for your X-Ray today  4)  Continue all medications that you may have been taking previously You will be  contacted about the referral(s) to: Surgury for the lump on the back 5)  followup as we discussed at your last office visit Prescriptions: INDOMETHACIN 50 MG CAPS (INDOMETHACIN) 1 by mouth three times a day as needed  #90 x 1   Entered and Authorized by:   Corwin Levins MD   Signed by:   Corwin Levins MD on 07/10/2008   Method used:   Print then Give to Patient   RxID:   [id number] OXYCODONE HCL 5 MG TABS (OXYCODONE HCL) 1 by mouth a 6 hrs as needed pain  #60 x 0   Entered and Authorized by:   Corwin Levins MD   Signed by:   Corwin Levins MD on 07/10/2008   Method used:   Print then Give to Patient   RxID:   [id number] PREDNISONE 10 MG TABS (PREDNISONE) 4po qd for 3days, then 3po qd for 3days, then 2po qd for 3days, then 1po qd for 3 days, then stop  #30 x 0   Entered and Authorized by:   Corwin Levins MD   Signed by:   Corwin Levins MD on 07/10/2008   Method used:   Print then Give to Patient   RxID:   [id number]    Medication Administration  Injection # 1:    Medication: Depo- Medrol 80mg     Diagnosis: ASTHMATIC BRONCHITIS, ACUTE (ICD-466.0)    Route: IM    Site: LUOQ gluteus    Exp Date: 06/2010    Lot #: [id number]    Mfr: [employer]    Comments: Depo Medrol 120 LUOQ    Patient tolerated injection without complications    Given by: Ami Bullins CMA (July 10, 2008 5:18 PM)  Orders Added: 1)  Surgical Referral [Surgery] 2)  Admin of Therapeutic Inj  intramuscular or subcutaneous [96372] 3)  Depo- Medrol 80mg  [J1040] 4)  T-Ankle Comp Right [73610TC] 5)  Est. Patient Level IV [40347]

## 2010-02-19 NOTE — Progress Notes (Signed)
Summary: see note about how to reach pt  Phone Note Call from Patient Call back at Home Phone [phone]   Caller: [phone]/ Call For: [facility] Reason for Call: Referral Summary of Call: PER .PT CALL HAVE A SPOT INSIDE HER MOUTH  WHITE .  NO PAIN. WANT TO BE REFFERED TO ENT . ITS SIZE OF  ERASER forgot to mentioned this  while she was here to see dr Jonny Ruiz Initial call taken by: Shelbie Proctor,  August 17, 2008 10:37 AM  Follow-up for Phone Call        ok for referral - oral lesion - I will do Follow-up by: Corwin Levins MD,  August 17, 2008 12:54 PM  Additional Follow-up for Phone Call Additional follow up Details #1::        called pt  to inform about referral will be done [facility]) pt want to be  called at this number [phone]/neighbor may lvg msg with them .Marland Kitchen..or call work [phone] Additional Follow-up by: Shelbie Proctor,  August 17, 2008 2:34 PM  New Problems: OTHER&UNSPECIFIED DISEASES THE ORAL SOFT TISSUES (ICD-528.9)   Additional Follow-up for Phone Call Additional follow up Details #2::    Called pt at the [phone] number, Lm w/person, awaiting return call. Also called wk# said she was not working today 08/17/08. Follow-up by: Dagoberto Reef,  August 17, 2008 3:22 PM  New Problems: OTHER&UNSPECIFIED DISEASES THE ORAL SOFT TISSUES (ICD-528.9)

## 2010-02-19 NOTE — Progress Notes (Signed)
  Phone Note Refill Request Message from:  Fax from Pharmacy on December 28, 2009 12:14 PM  Refills Requested: Medication #1:  LISINOPRIL-HYDROCHLOROTHIAZIDE 20-12.5 MG TABS Take 2 tablet by mouth once a day   Dosage confirmed as above?Dosage Confirmed   Last Refilled: 10/23/2009   Notes: [facility]  Medication #2:  METFORMIN HCL 500 MG  TABS 1 by mouth two times a day   Dosage confirmed as above?Dosage Confirmed   Last Refilled: 11/23/2009   Notes: [facility] Initial call taken by: Robin Ewing CMA (AAMA),  December 28, 2009 12:15 PM    Prescriptions: METFORMIN HCL 500 MG  TABS (METFORMIN HCL) 1 by mouth two times a day  #60 x 0   Entered by:   Scharlene Gloss CMA (AAMA)   Authorized by:   Corwin Levins MD   Signed by:   Scharlene Gloss CMA (AAMA) on 12/28/2009   Method used:   Faxed to ...       [facility] (retail)       [address]       [place], [place]  [id number]       Ph: [phone]       Fax: [phone]   RxID:   [id number] LISINOPRIL-HYDROCHLOROTHIAZIDE 20-12.5 MG TABS (LISINOPRIL-HYDROCHLOROTHIAZIDE) Take 2 tablet by mouth once a day  #60 Each x 0   Entered by:   Scharlene Gloss CMA (AAMA)   Authorized by:   Corwin Levins MD   Signed by:   Scharlene Gloss CMA (AAMA) on 12/28/2009   Method used:   Faxed to ...       [facility] (retail)       [address]       [place], [place]  [id number]       Ph: [phone]       Fax: [phone]   RxID:   [id number]

## 2010-02-19 NOTE — Progress Notes (Signed)
  Phone Note Call from Patient Call back at Home Phone [phone]   Caller: Patient Call For: dr Jonny Ruiz Summary of Call: per pt taking hydrocodone medication is making her gage. need one touch ultra strips called into [facility] [phone] [place] Initial call taken by: Shelbie Proctor,  January 08, 2007 11:52 AM  Follow-up for Phone Call        ok to take 1/2 pill to see if can tolerate better., o/w routine refills to [facility] Follow-up by: Corwin Levins MD,  January 08, 2007 1:18 PM    New/Updated Medications: Letta Pate TEST   STRP (GLUCOSE BLOOD)    Prescriptions: ONETOUCH TEST   STRP (GLUCOSE BLOOD)   #1 x 5   Entered by:   Maris Berger   Authorized by:   Corwin Levins MD   Signed by:   Maris Berger on 01/08/2007   Method used:   Electronically sent to ...       [facility]  [address]       [address]       [place], [place]  [address]       Ph: [phone]       Fax: [phone]   RxID:   [id number]     Appended Document:  pt called back and stated no one called in her refill and did not tell her what to do about the med making her gague i inform pt of the msg regarding the medication hydrocodone and what dr Jonny Ruiz advise her to do. i resent ann the refill note that she needed once again.pt stated she would like an call from the nurse just to inform when meds are called in ,been without since friday

## 2010-02-19 NOTE — Assessment & Plan Note (Signed)
Summary: 6 mos f/u $50/cd   Vital Signs:  Patient profile:   60 year old female Height:      65.5 inches Weight:      232 pounds BMI:     38.16 O2 Sat:      93 % Temp:     98.3 degrees F oral Pulse rate:   87 / minute BP sitting:   158 / 110  (left arm) Cuff size:   large  Vitals Entered By: Windell Norfolk (August 14, 2008 4:08 PM) CC: 6 month f/u Comments pt states no longer takes levothyroxine   CC:  6 month f/u.  History of Present Illness: overall doing well, no major new complaints; wt overall mostly stable and hard to lose though she is tyring to follow the diet and be more active;  denies polys or low sugars, good compliacne with meds; denies CP, sob, doe, orthopnea, pnd or worsening LE edema, also nbo new neuro sympotms such as HA, facial or extremity weakness; no apparetn med side effects such as myalgias or arthralgias, dizziness, or syncope  Problems Prior to Update: 1)  Other&unspecified Diseases The Oral Soft Tissues  (ICD-528.9) 2)  Lipoma  (ICD-214.9) 3)  Ankle Pain, Right  (ICD-719.47) 4)  Gout  (ICD-274.9) 5)  Asthmatic Bronchitis, Acute  (ICD-466.0) 6)  Abdominal Pain, Unspecified Site  (ICD-789.00) 7)  Preventive Health Care  (ICD-V70.0) 8)  Knee Pain, Bilateral  (ICD-719.46) 9)  Leg Pain, Bilateral  (ICD-729.5) 10)  Depressive Disorder  (ICD-311) 11)  Thumb Pain  (ICD-729.5) 12)  Hernia, Site Nos w/o Obstruction/gangrene  (ICD-553.9) 13)  Lipoma Nos  (ICD-214.9) 14)  Degenerative Joint Disease, Hands  (ICD-715.94) 15)  Obstructive Sleep Apnea  (ICD-327.23) 16)  Hyperlipidemia  (ICD-272.4) 17)  Hypothyroidism  (ICD-244.9) 18)  Pacemaker, Permanent  (ICD-V45.01) 19)  Bradycardia, Chronic  (ICD-427.89) 20)  Hypertension  (ICD-401.9) 21)  Diabetes Mellitus, Type II  (ICD-250.00)  Medications Prior to Update: 1)  Carvedilol 12.5 Mg Tabs (Carvedilol) .... Take One Tablet By Mouth Twice A Day 2)  Glimepiride 4 Mg Tabs (Glimepiride) .... Take 1 Tablet By  Mouth Twice A Day 3)  Levothyroxine Sodium 50 Mcg Tabs (Levothyroxine Sodium) .... Take 1 Tablet By Mouth Once A Day 4)  Lisinopril-Hydrochlorothiazide 20-12.5 Mg Tabs (Lisinopril-Hydrochlorothiazide) .... Take 2 Tablet By Mouth Once A Day 5)  Pravastatin Sodium 40 Mg Tabs (Pravastatin Sodium) .... Take 2 Tablet By Mouth Once A Day 6)  Sertraline Hcl 100 Mg  Tabs (Sertraline Hcl) .Marland Kitchen.. 1 and 1/2 By Mouth Once Daily 7)  Onetouch Test   Strp (Glucose Blood) .... Use As Directed 8)  Onetouch Finepoint Lancets   Misc (Lancets) .... Use As Directed 9)  Metformin Hcl 500 Mg  Tabs (Metformin Hcl) .Marland Kitchen.. 1 By Mouth Two Times A Day 10)  Clonidine Hcl 0.1 Mg Tabs (Clonidine Hcl) .... One Tab Twice A Day 11)  Klor-Con M20 20 Meq Cr-Tabs (Potassium Chloride Crys Cr) .... Take 1 Two Times A Day 12)  Oxycodone Hcl 5 Mg Tabs (Oxycodone Hcl) .Marland Kitchen.. 1 By Mouth A 6 Hrs As Needed Pain 13)  Indomethacin 50 Mg Caps (Indomethacin) .Marland Kitchen.. 1 By Mouth Three Times A Day As Needed 14)  Azithromycin 250 Mg Tabs (Azithromycin) .... 2po Qd For 1 Day, Then 1po Qd For 4days, Then Stop  Current Medications (verified): 1)  Carvedilol 25 Mg Tabs (Carvedilol) .Marland Kitchen.. 1po Two Times A Day 2)  Glimepiride 4 Mg Tabs (Glimepiride) .... Take 1 Tablet  By Mouth Twice A Day 3)  Lisinopril-Hydrochlorothiazide 20-12.5 Mg Tabs (Lisinopril-Hydrochlorothiazide) .... Take 2 Tablet By Mouth Once A Day 4)  Pravastatin Sodium 40 Mg Tabs (Pravastatin Sodium) .... Take 2 Tablet By Mouth Once A Day 5)  Sertraline Hcl 100 Mg  Tabs (Sertraline Hcl) .Marland Kitchen.. 1 and 1/2 By Mouth Once Daily 6)  Onetouch Test   Strp (Glucose Blood) .... Use As Directed 7)  Onetouch Finepoint Lancets   Misc (Lancets) .... Use As Directed 8)  Metformin Hcl 500 Mg  Tabs (Metformin Hcl) .Marland Kitchen.. 1 By Mouth Two Times A Day 9)  Clonidine Hcl 0.1 Mg Tabs (Clonidine Hcl) .... One Tab Twice A Day 10)  Klor-Con M20 20 Meq Cr-Tabs (Potassium Chloride Crys Cr) .... Take 1 Two Times A Day 11)   Oxycodone Hcl 5 Mg Tabs (Oxycodone Hcl) .Marland Kitchen.. 1 By Mouth A 6 Hrs As Needed Pain 12)  Indomethacin 50 Mg Caps (Indomethacin) .Marland Kitchen.. 1 By Mouth Three Times A Day As Needed  Allergies (verified): 1)  ! Penicillin V Potassium (Penicillin V Potassium) 2)  ! Nsaids 3)  ! Darvocet-N 100 (Propoxyphene N-Apap) 4)  ! Septra Ds (Sulfamethoxazole-Trimethoprim) 5)  ! Vicodin 6)  Codeine Phosphate (Codeine Phosphate) 7)  Naprosyn (Naproxen)  Past History:  Past Medical History: Last updated: 07/10/2008 ABDOMINAL PAIN, UNSPECIFIED SITE (ICD-789.00) PREVENTIVE HEALTH CARE (ICD-V70.0) KNEE PAIN, BILATERAL (ICD-719.46) LEG PAIN, BILATERAL (ICD-729.5) DEPRESSIVE DISORDER (ICD-311) THUMB PAIN (ICD-729.5) HERNIA, SITE NOS W/O OBSTRUCTION/GANGRENE (ICD-553.9) LIPOMA NOS (ICD-214.9) DEGENERATIVE JOINT DISEASE, HANDS (ICD-715.94) OBSTRUCTIVE SLEEP APNEA (ICD-327.23) HYPERLIPIDEMIA (ICD-272.4) HYPOTHYROIDISM (ICD-244.9) PACEMAKER, PERMANENT (ICD-V45.01) BRADYCARDIA, CHRONIC (ICD-427.89) HYPERTENSION (ICD-401.9) DIABETES MELLITUS, TYPE II (ICD-250.00) Gout  Past Surgical History: Last updated: 04/22/2008 Hysterectomy hernia repair '73  Removal of previous implanted pacemaker, which is   a ERI, insertion of a new dual-chamber pacemaker with pacemaker pocket   revision.  05/25/2007  Doylene Canning. 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   Head:  normocephalic and atraumatic.   Eyes:  vision grossly intact, pupils equal, and pupils round.   Ears:  R ear normal and L ear normal.   Nose:  no external deformity and no nasal discharge.   Mouth:  no gingival abnormalities and pharynx pink and moist.   Neck:  supple and no masses.   Lungs:  normal respiratory effort  and normal breath sounds.   Heart:  normal rate and regular rhythm.   Extremities:  no edema, no ulcers    Impression & Recommendations:  Problem # 1:  DIABETES MELLITUS, TYPE II (ICD-250.00)  Her updated medication list for this problem includes:    Glimepiride 4 Mg Tabs (Glimepiride) .Marland Kitchen... Take 1 tablet by mouth twice a day    Lisinopril-hydrochlorothiazide 20-12.5 Mg Tabs (Lisinopril-hydrochlorothiazide) .Marland Kitchen... Take 2 tablet by mouth once a day    Metformin Hcl 500 Mg Tabs (Metformin hcl) .Marland Kitchen... 1 by mouth two times a day  Labs Reviewed: Creat: 0.8 (08/09/2008)    Reviewed HgBA1c results: 7.0 (08/09/2008)  6.9 (02/16/2008) stable overall by hx and exam, ok to continue meds/tx as is   Problem # 2:  HYPERTENSION (ICD-401.9) Assessment: Deteriorated  Her updated medication list for this problem includes:    Carvedilol 25 Mg Tabs (Carvedilol) .Marland Kitchen... 1po two times a day  Lisinopril-hydrochlorothiazide 20-12.5 Mg Tabs (Lisinopril-hydrochlorothiazide) .Marland Kitchen... Take 2 tablet by mouth once a day    Clonidine Hcl 0.1 Mg Tabs (Clonidine hcl) ..... One tab twice a day ok to increase  the coreg to 25 two times a day for uncontroleld pressure  BP today: 158/110 Prior BP: 138/102 (07/25/2008)  Labs Reviewed: K+: 3.9 (08/09/2008) Creat: : 0.8 (08/09/2008)   Chol: 158 (08/09/2008)   HDL: 55.00 (08/09/2008)   LDL: 89 (08/09/2008)   TG: 69.0 (08/09/2008)  Problem # 3:  HYPERLIPIDEMIA (ICD-272.4)  Her updated medication list for this problem includes:    Pravastatin Sodium 40 Mg Tabs (Pravastatin sodium) .Marland Kitchen... Take 2 tablet by mouth once a day  Labs Reviewed: SGOT: 22 (02/16/2008)   SGPT: 16 (02/16/2008)   HDL:55.00 (08/09/2008), 49.3 (02/16/2008)  LDL:89 (08/09/2008), 95 (02/16/2008)  Chol:158 (08/09/2008), 178 (02/16/2008)  Trig:69.0 (08/09/2008), 167 (02/16/2008) stable overall by hx and exam, ok to continue meds/tx as is   Complete Medication List: 1)  Carvedilol 25 Mg Tabs (Carvedilol)  .Marland Kitchen.. 1po two times a day 2)  Glimepiride 4 Mg Tabs (Glimepiride) .... Take 1 tablet by mouth twice a day 3)  Lisinopril-hydrochlorothiazide 20-12.5 Mg Tabs (Lisinopril-hydrochlorothiazide) .... Take 2 tablet by mouth once a day 4)  Pravastatin Sodium 40 Mg Tabs (Pravastatin sodium) .... Take 2 tablet by mouth once a day 5)  Sertraline Hcl 100 Mg Tabs (Sertraline hcl) .Marland Kitchen.. 1 and 1/2 by mouth once daily 6)  Onetouch Test Strp (Glucose blood) .... Use as directed 7)  Onetouch Finepoint Lancets Misc (Lancets) .... Use as directed 8)  Metformin Hcl 500 Mg Tabs (Metformin hcl) .Marland Kitchen.. 1 by mouth two times a day 9)  Clonidine Hcl 0.1 Mg Tabs (Clonidine hcl) .... One tab twice a day 10)  Klor-con M20 20 Meq Cr-tabs (Potassium chloride crys cr) .... Take 1 two times a day 11)  Oxycodone Hcl 5 Mg Tabs (Oxycodone hcl) .Marland Kitchen.. 1 by mouth a 6 hrs as needed pain 12)  Indomethacin 50 Mg Caps (Indomethacin) .Marland Kitchen.. 1 by mouth three times a day as needed  Patient Instructions: 1)  increase the carvedilol to 25 mg twice per day 2)  Continue all medications that you may have been taking previously  3)  Please schedule a follow-up appointment in 6 months with CPX labs and : 4)  HbgA1C prior to visit, ICD-9: 5)  Urine Microalbumin prior to visit, ICD-9: Prescriptions: OXYCODONE HCL 5 MG TABS (OXYCODONE HCL) 1 by mouth a 6 hrs as needed pain  #80 x 0   Entered and Authorized by:   Corwin Levins MD   Signed by:   Corwin Levins MD on 08/14/2008   Method used:   Print then Give to Patient   RxID:   [id number] CARVEDILOL 25 MG TABS (CARVEDILOL) 1po two times a day  #60 x 11   Entered and Authorized by:   Corwin Levins MD   Signed by:   Corwin Levins MD on 08/14/2008   Method used:   Print then Give to Patient   RxID:   [id number]

## 2010-02-19 NOTE — Progress Notes (Signed)
  DDS Request recieved sent to [facility]. [name]  September 04, 2009 8:19 AM

## 2010-02-19 NOTE — Miscellaneous (Signed)
Summary: [facility]  Patient: [name] Note: All result statuses are Final unless otherwise noted.  Tests: (1) VIP (Medications)   LLIMPORTMEDS              "Result Below..."       RESULT: VERAPAMIL HCL CR TBCR 240 MG*TAKE ONE (1) TABLET BY MOUTH EVERY DAY*10/21/2005*Last Refill: Dillian.[email or web address]*******   LLIMPORTMEDS              "Result Below..."       RESULT: TOPROL XL TB24 50 MG*TAKE ONE (1) TABLET BY MOUTH EVERY DAY*10/21/2005*Last Refill: [email or web address]*[id number]*******   LLIMPORTMEDS              "Result Below..."       RESULT: PROTONIX TBEC 40 MG*TAKE ONE (1) TABLET BY MOUTH EVERY DAY*10/21/2005*Last Refill: [email or web address]*[id number]*******   LLIMPORTMEDS              "Result Below..."       RESULT: HYDROCHLOROTHIAZIDE TABS 25 MG*TAKE ONE (1) TABLET BY MOUTH EVERY DAY*10/21/2005*Last Refill: Unknown*[id number]*******   LLIMPORTMEDS              "Result Below..."       RESULT: GLIMEPIRIDE TABS 4 MG*TAKE ONE TABLET BY MOUTH EVERY MORNING*10/21/2005*Last Refill: Unknown*[id number]*******   LLIMPORTMEDS              "Result Below..."       RESULT: DURATUSS AC 12 SUSP 15-12.5-15 MG/5ML*TAKE ONE TEASPOONFUL EVERY 12 HOURS*10/21/2005*Last Refill: Unknown*[id number]*******   LLIMPORTMEDS              "Result Below..."       RESULT: AVELOX TABS 400 MG*TAKE ONE (1) TABLET BY MOUTH EVERY DAY*10/21/2005*Last Refill: Unknown*[id number]*******   LLIMPORTALLS              ***  Note: An exclamation mark (!) indicates a result that was not dispersed into the flowsheet. Document Creation Date: 11/19/2006 3:00 PM _______________________________________________________________________  (1) Order result status: Final Collection or observation date-time: 10/07/2006 Requested date-time: 10/07/2006 Receipt date-time:  Reported date-time: 10/07/2006 Referring Physician:   Ordering Physician:   Specimen Source:  Source: [place] Order Number:  Lab site:

## 2010-02-19 NOTE — Progress Notes (Signed)
Summary: Pain med refill  Phone Note Call from Patient Call back at Home Phone [phone] Call back at [phone]   Caller: Patient Call For: Dr Jonny Ruiz Reason for Call: Refill Medication Summary of Call: Patient called requesting to pick up rx for Oxycodone 5mg  (QID). Patient notified MD out of the office until 03/29/07 Initial call taken by: Rock Nephew CMA,  March 26, 2007 9:17 AM  Follow-up for Phone Call        done hardcopy Follow-up by: Corwin Levins MD,  March 26, 2007 12:24 PM  Additional Follow-up for Phone Call Additional follow up Details #1::        Returned call//[email or web address] to pick up Additional Follow-up by: Rock Nephew CMA,  March 26, 2007 2:39 PM      Prescriptions: OXYCODONE HCL 5 MG  TABS (OXYCODONE HCL) 1 by mouth qid prn  #120 x 0   Entered and Authorized by:   Corwin Levins MD   Signed by:   Corwin Levins MD on 03/26/2007   Method used:   Print then Give to Patient   RxID:   [id number]

## 2010-02-19 NOTE — Miscellaneous (Signed)
  Clinical Lists Changes  Observations: Added new observation of LEA DUPLEX: Summary:    - No evidence of deep vein or superficial thrombosis involving the     right lower extremity and left lower extremity.   - No evidence of Baker's cyst on the right or left.   Other specific details can be found in the table(s) above.  Prepared   and Electronically Authenticated by    Waverly Ferrari, MD   2010-06-04T21:33:51.063  (06/19/2008 13:59)      Arterial Doppler  Procedure date:  06/19/2008  Findings:      Summary:    - No evidence of deep vein or superficial thrombosis involving the     right lower extremity and left lower extremity.   - No evidence of Baker's cyst on the right or left.   Other specific details can be found in the table(s) above.  Prepared   and Electronically Authenticated by    Waverly Ferrari, MD   2010-06-04T21:33:51.063

## 2010-02-19 NOTE — Assessment & Plan Note (Signed)
Summary: FU ON MEDS/NWS   Vital Signs:  Patient profile:   60 year old female Height:      65 inches Weight:      232 pounds BMI:     38.75 O2 Sat:      98 % on Room air Temp:     97.9 degrees F oral Pulse rate:   85 / minute BP sitting:   144 / 80  (left arm) Cuff size:   large  Vitals Entered ByZella Ball Ewing (November 22, 2008 3:01 PM)  O2 Flow:  Room air CC: follow up on meds/RE   CC:  follow up on meds/RE.  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  Pain and swelling increased now mod to severe.  She is quite worried about the fact that she is DM and wonders if somehow this will lead to loss of foot or limb as she has seen with several older persons with DM that she knows.  She has no hx of PVD or claudication symtpoms, and no hx of vascular insuff, venous or arterial ulcerations.  No fever or recent trauma to explain recent pain.  Does have severe pes planus.  Pt denies CP, sob, doe, wheezing, orthopnea, pnd, worsening LE edema, palps, dizziness or syncope   Pt denies new neuro symptoms such as headache, facial or extremity weakness   Pt denies polydipsia, polyuria, or low sugar symptoms such as shakiness improved with eating.  Overall good compliance with meds, trying to follow low chol, DM diet, wt stable, little excercise however   Problems Prior to Update: 1)  Other&unspecified Diseases The Oral Soft Tissues  (ICD-528.9) 2)  Lipoma  (ICD-214.9) 3)  Ankle Pain, Right  (ICD-719.47) 4)  Gout  (ICD-274.9) 5)  Asthmatic Bronchitis, Acute   (ICD-466.0) 6)  Abdominal Pain, Unspecified Site  (ICD-789.00) 7)  Preventive Health Care  (ICD-V70.0) 8)  Knee Pain, Bilateral  (ICD-719.46) 9)  Leg Pain, Bilateral  (ICD-729.5) 10)  Depressive Disorder  (ICD-311) 11)  Thumb Pain  (ICD-729.5) 12)  Hernia, Site Nos w/o Obstruction/gangrene  (ICD-553.9) 13)  Lipoma Nos  (ICD-214.9) 14)  Degenerative Joint Disease, Hands  (ICD-715.94) 15)  Obstructive Sleep Apnea  (ICD-327.23) 16)  Hyperlipidemia  (ICD-272.4) 17)  Hypothyroidism  (ICD-244.9) 18)  Pacemaker, Permanent  (ICD-V45.01) 19)  Bradycardia, Chronic  (ICD-427.89) 20)  Hypertension  (ICD-401.9) 21)  Diabetes Mellitus, Type II  (ICD-250.00)  Medications Prior to Update: 1)  Carvedilol 25 Mg Tabs (Carvedilol) .Marland Kitchen.. 1po Two Times A Day 2)  Glimepiride 4 Mg Tabs (Glimepiride) .... Take 1 Tablet By Mouth Twice A Day 3)  Lisinopril-Hydrochlorothiazide 20-12.5 Mg Tabs (Lisinopril-Hydrochlorothiazide) .... Take 2 Tablet By Mouth Once A Day 4)  Pravastatin Sodium 40 Mg Tabs (Pravastatin Sodium) .... Take 2 Tablet By Mouth Once A Day 5)  Sertraline Hcl 100 Mg  Tabs (Sertraline Hcl) .Marland Kitchen.. 1 and 1/2 By Mouth Once Daily 6)  Onetouch Test   Strp (Glucose Blood) .... Use As Directed  7)  Onetouch Finepoint Lancets   Misc (Lancets) .... Use As Directed 8)  Metformin Hcl 500 Mg  Tabs (Metformin Hcl) .Marland Kitchen.. 1 By Mouth Two Times A Day 9)  Clonidine Hcl 0.1 Mg Tabs (Clonidine Hcl) .... One Tab Twice A Day 10)  Klor-Con M20 20 Meq Cr-Tabs (Potassium Chloride Crys Cr) .... Take 1 Two Times A Day 11)  Oxycodone Hcl 5 Mg Tabs (Oxycodone Hcl) .Marland Kitchen.. 1 By Mouth A 6 Hrs As Needed Pain - To Fill Oct 22, 2008 12)  Indomethacin 50 Mg Caps (Indomethacin) .Marland Kitchen.. 1 By Mouth Three Times A Day As Needed  Current Medications (verified): 1)  Carvedilol 25 Mg Tabs (Carvedilol) .Marland Kitchen.. 1po Two Times A Day 2)  Glimepiride 4 Mg Tabs (Glimepiride) .... Take 1 Tablet By Mouth Twice A Day 3)  Lisinopril-Hydrochlorothiazide 20-12.5  Mg Tabs (Lisinopril-Hydrochlorothiazide) .... Take 2 Tablet By Mouth Once A Day 4)  Pravastatin Sodium 40 Mg Tabs (Pravastatin Sodium) .... Take 2 Tablet By Mouth Once A Day 5)  Sertraline Hcl 100 Mg  Tabs (Sertraline Hcl) .Marland Kitchen.. 1 and 1/2 By Mouth Once Daily 6)  Onetouch Test   Strp (Glucose Blood) .... Use As Directed 7)  Onetouch Finepoint Lancets   Misc (Lancets) .... Use As Directed 8)  Metformin Hcl 500 Mg  Tabs (Metformin Hcl) .Marland Kitchen.. 1 By Mouth Two Times A Day 9)  Clonidine Hcl 0.1 Mg Tabs (Clonidine Hcl) .... One Tab Twice A Day 10)  Klor-Con M20 20 Meq Cr-Tabs (Potassium Chloride Crys Cr) .... Take 1 Two Times A Day 11)  Oxycodone Hcl 5 Mg Tabs (Oxycodone Hcl) .Marland Kitchen.. 1 By Mouth A 6 Hrs As Needed Pain - To Fill Oct 22, 2008 12)  Indomethacin 50 Mg Caps (Indomethacin) .Marland Kitchen.. 1 By Mouth Three Times A Day As Needed 13)  Tramadol Hcl 50 Mg Tabs (Tramadol Hcl) .Marland Kitchen.. 1 - 2 By Mouth Q 6 Hrs As Needed Pain  Allergies (verified): 1)  ! Penicillin V Potassium (Penicillin V Potassium) 2)  ! Nsaids 3)  ! Darvocet-N 100 (Propoxyphene N-Apap) 4)  ! Septra Ds (Sulfamethoxazole-Trimethoprim) 5)  ! Vicodin 6)  Codeine Phosphate (Codeine Phosphate) 7)  Naprosyn (Naproxen)  Past History:  Past Medical History: Last updated: 07/10/2008 ABDOMINAL PAIN, UNSPECIFIED SITE (ICD-789.00) PREVENTIVE HEALTH CARE (ICD-V70.0) KNEE PAIN, BILATERAL (ICD-719.46) LEG PAIN, BILATERAL (ICD-729.5) DEPRESSIVE DISORDER (ICD-311) THUMB PAIN (ICD-729.5) HERNIA, SITE NOS W/O OBSTRUCTION/GANGRENE (ICD-553.9) LIPOMA NOS (ICD-214.9) DEGENERATIVE JOINT DISEASE, HANDS (ICD-715.94) OBSTRUCTIVE SLEEP APNEA (ICD-327.23) HYPERLIPIDEMIA (ICD-272.4) HYPOTHYROIDISM (ICD-244.9) PACEMAKER, PERMANENT (ICD-V45.01) BRADYCARDIA, CHRONIC (ICD-427.89) HYPERTENSION (ICD-401.9) DIABETES MELLITUS, TYPE II (ICD-250.00) Gout  Past Surgical History: Last updated: 04/22/2008 Hysterectomy hernia repair '73  Removal of previous implanted  pacemaker, which is   a ERI, insertion of a new dual-chamber pacemaker with pacemaker pocket   revision.  05/25/2007  [name]. 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   Head:  normocephalic and atraumatic.   Eyes:  vision grossly intact, pupils equal, and pupils round.   Ears:  R ear normal and L ear normal.   Nose:  no external deformity and no nasal discharge.   Mouth:  no gingival abnormalities and pharynx pink and moist.   Neck:  supple and no masses.   Lungs:  normal  respiratory effort and normal breath sounds.   Heart:  normal rate and regular rhythm.   Msk:  right ankle and medial foot with marked tender and swelling to the left lat mallealar area, also the tarsal tunnel area Pulses:  1+ bilat dorsalis pedis Extremities:  no edema, no erythema  Neurologic:  strength normal in all extremities and sensation intact to light touch.     Impression & Recommendations:  Problem # 1:  ANKLE PAIN, RIGHT (ICD-719.47)  refer local podiatry - [facility], has flat feet but also it seems prob severe tarsal tunnel tendonitis; no evidence for vscular insufficiency, cellulitis or charcot joint, or ulceraiton - she is o/w reassured; also gave tramadol for better pain control; consider referral to ortho (dr Lestine Box) but she explains she still has an outstanding balance at [facility] and they will not see her  Orders: Podiatry Referral (Podiatry)  Problem # 2:  DIABETES MELLITUS, TYPE II (ICD-250.00)  Her updated medication list for this problem includes:    Glimepiride 4 Mg Tabs (Glimepiride) .Marland Kitchen... Take 1 tablet by mouth twice a day    Lisinopril-hydrochlorothiazide 20-12.5 Mg Tabs (Lisinopril-hydrochlorothiazide) .Marland Kitchen... Take 2 tablet by  mouth once a day    Metformin Hcl 500 Mg Tabs (Metformin hcl) .Marland Kitchen... 1 by mouth two times a day  Labs Reviewed: Creat: 0.8 (08/09/2008)    Reviewed HgBA1c results: 7.0 (08/09/2008)  6.9 (02/16/2008) stable overall by hx and exam, ok to continue meds/tx as is   Problem # 3:  HYPERTENSION (ICD-401.9)  Her updated medication list for this problem includes:    Carvedilol 25 Mg Tabs (Carvedilol) .Marland Kitchen... 1po two times a day    Lisinopril-hydrochlorothiazide 20-12.5 Mg Tabs (Lisinopril-hydrochlorothiazide) .Marland Kitchen... Take 2 tablet by mouth once a day    Clonidine Hcl 0.1 Mg Tabs (Clonidine hcl) ..... One tab twice a day  BP today: 144/80 Prior BP: 158/110 (08/14/2008)  Labs Reviewed: K+: 3.9 (08/09/2008) Creat: : 0.8 (08/09/2008)   Chol: 158 (08/09/2008)   HDL: 55.00 (08/09/2008)   LDL: 89 (08/09/2008)   TG: 69.0 (08/09/2008) mild elev today, likely situational, ok to follow, continue same treatment   Complete Medication List: 1)  Carvedilol 25 Mg Tabs (Carvedilol) .Marland Kitchen.. 1po two times a day 2)  Glimepiride 4 Mg Tabs (Glimepiride) .... Take 1 tablet by mouth twice a day 3)  Lisinopril-hydrochlorothiazide 20-12.5 Mg Tabs (Lisinopril-hydrochlorothiazide) .... Take 2 tablet by mouth once a day 4)  Pravastatin Sodium 40 Mg Tabs (Pravastatin sodium) .... Take 2 tablet by mouth once a day 5)  Sertraline Hcl 100 Mg Tabs (Sertraline hcl) .Marland Kitchen.. 1 and 1/2 by mouth once daily 6)  Onetouch Test Strp (Glucose blood) .... Use as directed 7)  Onetouch Finepoint Lancets Misc (Lancets) .... Use as directed 8)  Metformin Hcl 500 Mg Tabs (Metformin hcl) .Marland Kitchen.. 1 by mouth two times a day 9)  Clonidine Hcl 0.1 Mg Tabs (Clonidine hcl) .... One tab twice a day 10)  Klor-con M20 20 Meq Cr-tabs (Potassium chloride crys cr) .... Take 1 two times a day 11)  Oxycodone Hcl 5 Mg Tabs (Oxycodone hcl) .Marland Kitchen.. 1 by mouth a 6 hrs as needed pain - to fill Oct 22, 2008 12)  Indomethacin 50 Mg Caps (Indomethacin) .Marland Kitchen.. 1 by mouth three times  a day as needed 13)  Tramadol Hcl 50 Mg Tabs (Tramadol hcl) .Marland Kitchen.. 1 - 2 by mouth q 6 hrs as needed pain  Other Orders: Admin 1st Vaccine (16109) Flu Vaccine 67yrs + (  16109)  Patient Instructions: 1)  stop the etodolac as this seems to be hurting your stomach 2)  start the tramadol as needed for pain 3)  You will be contacted about the referral(s) to: Podiatry 4)  if not eventually better, please call as we can refer you to Dr Kandis Nab at [facility] orthopedic if you like at any time 5)  Please schedule a follow-up appointment in 2 months as previously recommended Prescriptions: TRAMADOL HCL 50 MG TABS (TRAMADOL HCL) 1 - 2 by mouth q 6 hrs as needed pain  #120 x 2   Entered and Authorized by:   Corwin Levins MD   Signed by:   Corwin Levins MD on 11/22/2008   Method used:   Print then Give to Patient   RxID:   [id number]     Flu Vaccine Consent Questions     Do you have a history of severe allergic reactions to this vaccine? no    Any prior history of allergic reactions to egg and/or gelatin? no    Do you have a sensitivity to the preservative Thimersol? no    Do you have a past history of Guillan-Barre Syndrome? no    Do you currently have an acute febrile illness? no    Have you ever had a severe reaction to latex? no    Vaccine information given and explained to patient? yes    Are you currently pregnant? no    Lot Number:[id number]   Exp Date:07/19/2009   Site Given  Left Deltoid IMflu

## 2010-02-19 NOTE — Progress Notes (Signed)
Summary: REFERRAL?  Phone Note Call from Patient Call back at [phone]   Summary of Call: Patient is requesting a call about referral, she says [facility] [place]. Also req refill on pain med (see other phone note) Initial call taken by: Lamar Sprinkles,  August 31, 2008 12:09 PM  Follow-up for Phone Call        ok for both - done hardcopy to LIM side B - sara  Follow-up by: Corwin Levins MD,  August 31, 2008 12:46 PM  Additional Follow-up for Phone Call Additional follow up Details #1::        Called pt not @ home Left msg with gentleman for her to return our call (put rx in cabinet up front) Additional Follow-up by: Orlan Leavens,  August 31, 2008 1:27 PM    Additional Follow-up for Phone Call Additional follow up Details #2::    Pt return called notified her that rx ready for pick-up. also pt want md to go ahead and set up referral to foot drin [place]. she stated md has referred her before but was'nt able to do at that time. Follow-up by: Orlan Leavens,  August 31, 2008 1:49 PM  Additional Follow-up for Phone Call Additional follow up Details #3:: Details for Additional Follow-up Action Taken: I dont know who that was - to [facility] to determine where pt wants to go Additional Follow-up by: Corwin Levins MD,  August 31, 2008 2:10 PM  New/Updated Medications: OXYCODONE HCL 5 MG TABS (OXYCODONE HCL) 1 by mouth a 6 hrs as needed pain - to fill September 03, 2008 Prescriptions: OXYCODONE HCL 5 MG TABS (OXYCODONE HCL) 1 by mouth a 6 hrs as needed pain - to fill September 03, 2008  #80 x 0   Entered by:   Corwin Levins MD   Authorized by:   B LIM Triage Nurse   Signed by:   Corwin Levins MD on 08/31/2008   Method used:   Print then Give to Patient   RxID:   [id number]  09/08/08 @ 11:06 pt is calling back concerning referral to see foot md. Per chart Deb has left msg for pt to call back. forwarding msg to debra CONCERNING APPT/LMB   09/08/08 Called Patient appt  09/13/08 @ 945am Dr Corie Chiquito- [facility] [place] [phone], pt aware of appt Dagoberto Reef  September 08, 2008 1:15 PM

## 2010-02-19 NOTE — Progress Notes (Signed)
Summary: RX REFILL   Phone Note Refill Request Call back at Home Phone [phone] Message from:  Patient  Refills Requested: Medication #1:  CLONIDINE HCL 0.1 MG TABS one tab twice a day   Supply Requested: 3 months [facility] [phone]   Method Requested: Telephone to Pharmacy Initial call taken by: Ammie del Tomasa Rand,  May 22, 2008 10:46 AM   New Allergies: CODEINE PHOSPHATE (CODEINE PHOSPHATE) NAPROSYN (NAPROXEN) New Allergies: CODEINE PHOSPHATE (CODEINE PHOSPHATE) NAPROSYN (NAPROXEN)  Prescriptions: CLONIDINE HCL 0.1 MG TABS (CLONIDINE HCL) one tab twice a day  #6 x 6   Entered by:   Flonnie Overman   Authorized by:   Laren Boom, MD, [facility]   Signed by:   Flonnie Overman on 05/23/2008   Method used:   [facility]        [facility] [phone]* (retail)       [address]       [place], [place]  [address]       Ph: [phone] or [phone]       Fax: [phone]   RxID:   [id number]

## 2010-02-19 NOTE — Consult Note (Signed)
Summary: Diabetic Eye Exam/Dr Clarene Critchley  Diabetic Eye Exam/Dr Clarene Critchley   Imported By: Esmeralda Links D'jimraou 09/16/2007 11:33:03  _____________________________________________________________________  External Attachment:    Type:   Image     Comment:   External Document

## 2010-02-19 NOTE — Medication Information (Signed)
Summary: Celebrex approved/[facility]  Celebrex approved/[facility]   Imported By: Lester Jennings 09/21/2007 10:02:15  _____________________________________________________________________  External Attachment:    Type:   Image     Comment:   External Document

## 2010-02-19 NOTE — Progress Notes (Signed)
  Phone Note Refill Request Message from:  Fax from Pharmacy on November 12, 2009 8:48 AM  Refills Requested: Medication #1:  PRAVASTATIN SODIUM 40 MG TABS Take 2 tablet by mouth once a day   Dosage confirmed as above?Dosage Confirmed   Notes: Nicolette Bang [address] [facility] [place] Initial call taken by: Zella Ball Ewing CMA Duncan Dull),  November 12, 2009 8:49 AM    Prescriptions: PRAVASTATIN SODIUM 40 MG TABS (PRAVASTATIN SODIUM) Take 2 tablet by mouth once a day  #60 x 0   Entered by:   Scharlene Gloss CMA (AAMA)   Authorized by:   Corwin Levins MD   Signed by:   Scharlene Gloss CMA (AAMA) on 11/12/2009   Method used:   Faxed to ...       [facility] [address] [facility] (retail)       [address]       [place], [place]  [address]       Ph: [phone]       Fax: [phone]   RxID:   [id number]

## 2010-02-19 NOTE — Progress Notes (Signed)
Summary: [facility]  Phone Note Outgoing Call   Call placed by: Dagoberto Reef,  July 31, 2008 10:03 AM Call placed to: Specialist Summary of Call: Dr Jonny Ruiz, [facility] has 2 doctors who does foot/ankle they are not taking any new diabetic patients because the diabetic clinic is full.They are referring them to [facility]. Dr Jonny Ruiz spoke with patient explained to her about referral to [facility], pt said she would call back to let us know when and where she would like to have referral done. Initial call taken by: Dagoberto Reef,  July 31, 2008 10:07 AM  Follow-up for Phone Call        noted Follow-up by: Corwin Levins MD,  July 31, 2008 12:33 PM

## 2010-02-19 NOTE — Assessment & Plan Note (Signed)
Summary: congestion/[name]/cd   Vital Signs:  Patient profile:   60 year old female Height:      65.5 inches Weight:      239.25 pounds BMI:     39.35 O2 Sat:      95 % Temp:     98.5 degrees F oral Pulse rate:   91 / minute BP sitting:   158 / 96  (right arm) Cuff size:   large  Vitals Entered By: Windell Norfolk (May 10, 2008 2:59 PM) CC: congestion x 4 days   CC:  congestion x 4 days.  History of Present Illness: here wtih 4 days acute onset fever, ST, cough, wheezing, sob and mild doe; but no CP, orthopnea, pnd or LE edema; no chills; denies polys or low sugars, good compliance with meds  Problems Prior to Update: 1)  Asthmatic Bronchitis, Acute  (ICD-466.0) 2)  Abdominal Pain, Unspecified Site  (ICD-789.00) 3)  Preventive Health Care  (ICD-V70.0) 4)  Knee Pain, Bilateral  (ICD-719.46) 5)  Leg Pain, Bilateral  (ICD-729.5) 6)  Depressive Disorder  (ICD-311) 7)  Thumb Pain  (ICD-729.5) 8)  Hernia, Site Nos w/o Obstruction/gangrene  (ICD-553.9) 9)  Lipoma Nos  (ICD-214.9) 10)  Degenerative Joint Disease, Hands  (ICD-715.94) 11)  Obstructive Sleep Apnea  (ICD-327.23) 12)  Hyperlipidemia  (ICD-272.4) 13)  Hypothyroidism  (ICD-244.9) 14)  Pacemaker, Permanent  (ICD-V45.01) 15)  Bradycardia, Chronic  (ICD-427.89) 16)  Hypertension  (ICD-401.9) 17)  Diabetes Mellitus, Type II  (ICD-250.00)  Medications Prior to Update: 1)  Carvedilol 6.25 Mg Tabs (Carvedilol) .... Take 1 Tablet By Mouth Twice A Day 2)  Glimepiride 4 Mg Tabs (Glimepiride) .... Take 1 Tablet By Mouth Twice A Day 3)  Levothyroxine Sodium 50 Mcg Tabs (Levothyroxine Sodium) .... Take 1 Tablet By Mouth Once A Day 4)  Lisinopril-Hydrochlorothiazide 20-12.5 Mg Tabs (Lisinopril-Hydrochlorothiazide) .... Take 2 Tablet By Mouth Once A Day 5)  Pravastatin Sodium 40 Mg Tabs (Pravastatin Sodium) .... Take 2 Tablet By Mouth Once A Day 6)  Sertraline Hcl 100 Mg  Tabs (Sertraline Hcl) .Marland Kitchen.. 1 and 1/2 By Mouth Once Daily 7)   Onetouch Test   Strp (Glucose Blood) .... Use As Directed 8)  Onetouch Finepoint Lancets   Misc (Lancets) .... Use As Directed 9)  Metformin Hcl 500 Mg  Tabs (Metformin Hcl) .Marland Kitchen.. 1 By Mouth Two Times A Day 10)  Clonidine Hcl 0.1 Mg Tabs (Clonidine Hcl) .... One Tab Twice A Day 11)  Gabapentin 300 Mg Caps (Gabapentin) .Marland Kitchen.. 1 By Mouth Three Times A Day  Current Medications (verified): 1)  Carvedilol 6.25 Mg Tabs (Carvedilol) .... Take 1 Tablet By Mouth Twice A Day 2)  Glimepiride 4 Mg Tabs (Glimepiride) .... Take 1 Tablet By Mouth Twice A Day 3)  Levothyroxine Sodium 50 Mcg Tabs (Levothyroxine Sodium) .... Take 1 Tablet By Mouth Once A Day 4)  Lisinopril-Hydrochlorothiazide 20-12.5 Mg Tabs (Lisinopril-Hydrochlorothiazide) .... Take 2 Tablet By Mouth Once A Day 5)  Pravastatin Sodium 40 Mg Tabs (Pravastatin Sodium) .... Take 2 Tablet By Mouth Once A Day 6)  Sertraline Hcl 100 Mg  Tabs (Sertraline Hcl) .Marland Kitchen.. 1 and 1/2 By Mouth Once Daily 7)  Onetouch Test   Strp (Glucose Blood) .... Use As Directed 8)  Onetouch Finepoint Lancets   Misc (Lancets) .... Use As Directed 9)  Metformin Hcl 500 Mg  Tabs (Metformin Hcl) .Marland Kitchen.. 1 By Mouth Two Times A Day 10)  Clonidine Hcl 0.1 Mg Tabs (Clonidine Hcl) .... One  Tab Twice A Day 11)  Azithromycin 250 Mg Tabs (Azithromycin) .... 2po Qd For 1 Day, Then 1po Qd For 4days, Then Stop 12)  Promethazine-Codeine 6.25-10 Mg/58ml Syrp (Promethazine-Codeine) .Marland Kitchen.. 1 Tsp By Mouth Q 6 Hrs As Needed Cough 13)  Prednisone 10 Mg Tabs (Prednisone) .... 3po Qd For 3days, Then 2po Qd For 3days, Then 1po Qd For 3days, Then Stop 14)  Proair Hfa 108 (90 Base) Mcg/act Aers (Albuterol Sulfate) .... 2 Puffs Four Times Per Day As Needed For Shortness of Breath  Allergies (verified): 1)  ! Penicillin V Potassium (Penicillin V Potassium) 2)  ! Nsaids 3)  ! Darvocet-N 100 (Propoxyphene N-Apap) 4)  ! Septra Ds (Sulfamethoxazole-Trimethoprim) 5)  ! Vicodin  Past History:  Past Medical  History:    ABDOMINAL PAIN, UNSPECIFIED SITE (ICD-789.00)    PREVENTIVE HEALTH CARE (ICD-V70.0)    KNEE PAIN, BILATERAL (ICD-719.46)    LEG PAIN, BILATERAL (ICD-729.5)    DEPRESSIVE DISORDER (ICD-311)    THUMB PAIN (ICD-729.5)    HERNIA, SITE NOS W/O OBSTRUCTION/GANGRENE (ICD-553.9)    LIPOMA NOS (ICD-214.9)    DEGENERATIVE JOINT DISEASE, HANDS (ICD-715.94)    OBSTRUCTIVE SLEEP APNEA (ICD-327.23)    HYPERLIPIDEMIA (ICD-272.4)    HYPOTHYROIDISM (ICD-244.9)    PACEMAKER, PERMANENT (ICD-V45.01)    BRADYCARDIA, CHRONIC (ICD-427.89)    HYPERTENSION (ICD-401.9)    DIABETES MELLITUS, TYPE II (ICD-250.00)     (04/22/2008)  Past Surgical History:    Hysterectomy    hernia repair '73     Removal of previous implanted pacemaker, which is      a ERI, insertion of a new dual-chamber pacemaker with pacemaker pocket      revision.  05/25/2007  Doylene Canning. Ladona Ridgel, MD  (04/22/2008)  Social History:    Alcohol use-no    Current Smoker    cashier    Married    2 children (05/19/2007)  Risk Factors:    Alcohol Use: N/A    >5 drinks/d w/in last 3 months: N/A    Caffeine Use: N/A    Diet: N/A    Exercise: N/A  Risk Factors:    Smoking Status: current (01/27/2007)    Packs/Day: N/A    Cigars/wk: N/A    Pipe Use/wk: N/A    Cans of tobacco/wk: N/A    Passive Smoke Exposure: N/A  Review of Systems       all otherwise negative   Physical Exam  General:  alert and overweight-appearing.  , mild ill  Head:  normocephalic and atraumatic.   Eyes:  vision grossly intact, pupils equal, and pupils round.   Ears:  bilat tm's red, sinus nontender Nose:  no external deformity and no nasal discharge.   Mouth:  no gingival abnormalities and pharynx pink and moist.   Neck:  supple and no masses.   Lungs:  R decreased breath sounds, R wheezes, L decreased breath sounds, and L wheezes.   Heart:  normal rate, regular rhythm, and no murmur.   Extremities:  no edema, no ulcers    Impression &  Recommendations:  Problem # 1:  ASTHMATIC BRONCHITIS, ACUTE (ICD-466.0)  Her updated medication list for this problem includes:    Azithromycin 250 Mg Tabs (Azithromycin) .Marland Kitchen... 2po qd for 1 day, then 1po qd for 4days, then stop    Promethazine-codeine 6.25-10 Mg/38ml Syrp (Promethazine-codeine) .Marland Kitchen... 1 tsp by mouth q 6 hrs as needed cough    Proair Hfa 108 (90 Base) Mcg/act Aers (Albuterol sulfate) .Marland Kitchen... 2 puffs four  times per day as needed for shortness of breath  Orders: Depo- Medrol 80mg  (J1040) Depo- Medrol 40mg  (J1030) Admin of Therapeutic Inj  intramuscular or subcutaneous (16109) treat as above, f/u any worsening signs or symptoms   Problem # 2:  DIABETES MELLITUS, TYPE II (ICD-250.00)  Her updated medication list for this problem includes:    Glimepiride 4 Mg Tabs (Glimepiride) .Marland Kitchen... Take 1 tablet by mouth twice a day    Lisinopril-hydrochlorothiazide 20-12.5 Mg Tabs (Lisinopril-hydrochlorothiazide) .Marland Kitchen... Take 2 tablet by mouth once a day    Metformin Hcl 500 Mg Tabs (Metformin hcl) .Marland Kitchen... 1 by mouth two times a day stable overall by hx and exam, ok to continue meds/tx as is - to call for cbg's > 250  Problem # 3:  HYPERTENSION (ICD-401.9)  Her updated medication list for this problem includes:    Carvedilol 6.25 Mg Tabs (Carvedilol) .Marland Kitchen... Take 1 tablet by mouth twice a day    Lisinopril-hydrochlorothiazide 20-12.5 Mg Tabs (Lisinopril-hydrochlorothiazide) .Marland Kitchen... Take 2 tablet by mouth once a day    Clonidine Hcl 0.1 Mg Tabs (Clonidine hcl) ..... One tab twice a day  BP today: 158/96 Prior BP: 148/104 (02/16/2008)  Labs Reviewed: K+: 4.0 (02/16/2008) Creat: : 0.9 (02/16/2008)   Chol: 178 (02/16/2008)   HDL: 49.3 (02/16/2008)   LDL: 95 (02/16/2008)   TG: 167 (02/16/2008) mild elevated likely situational - ok to follow  Complete Medication List: 1)  Carvedilol 6.25 Mg Tabs (Carvedilol) .... Take 1 tablet by mouth twice a day 2)  Glimepiride 4 Mg Tabs (Glimepiride) .... Take  1 tablet by mouth twice a day 3)  Levothyroxine Sodium 50 Mcg Tabs (Levothyroxine sodium) .... Take 1 tablet by mouth once a day 4)  Lisinopril-hydrochlorothiazide 20-12.5 Mg Tabs (Lisinopril-hydrochlorothiazide) .... Take 2 tablet by mouth once a day 5)  Pravastatin Sodium 40 Mg Tabs (Pravastatin sodium) .... Take 2 tablet by mouth once a day 6)  Sertraline Hcl 100 Mg Tabs (Sertraline hcl) .Marland Kitchen.. 1 and 1/2 by mouth once daily 7)  Onetouch Test Strp (Glucose blood) .... Use as directed 8)  Onetouch Finepoint Lancets Misc (Lancets) .... Use as directed 9)  Metformin Hcl 500 Mg Tabs (Metformin hcl) .Marland Kitchen.. 1 by mouth two times a day 10)  Clonidine Hcl 0.1 Mg Tabs (Clonidine hcl) .... One tab twice a day 11)  Azithromycin 250 Mg Tabs (Azithromycin) .... 2po qd for 1 day, then 1po qd for 4days, then stop 12)  Promethazine-codeine 6.25-10 Mg/14ml Syrp (Promethazine-codeine) .Marland Kitchen.. 1 tsp by mouth q 6 hrs as needed cough 13)  Prednisone 10 Mg Tabs (Prednisone) .... 3po qd for 3days, then 2po qd for 3days, then 1po qd for 3days, then stop 14)  Proair Hfa 108 (90 Base) Mcg/act Aers (Albuterol sulfate) .... 2 puffs four times per day as needed for shortness of breath  Patient Instructions: 1)  you received the steroid shot today 2)  Please take all new medications as prescribed  3)  Continue all medications that you may have been taking previously  4)  remember your sugars can up be elevated on the steroid treatment for a short while, then it will be back to normal after the prednisone is done 5)  Continue all medications that you may have been taking previously  6)  Please schedule a follow-up appointment in 3 months with : 7)  BMP prior to visit, ICD-9: 250.02 8)  Lipid Panel prior to visit, ICD-9: 9)  HbgA1C prior to visit, ICD-9: Prescriptions: PROAIR HFA  108 (90 BASE) MCG/ACT AERS (ALBUTEROL SULFATE) 2 puffs four times per day as needed for shortness of breath  #1 x 1   Entered and Authorized by:    Corwin Levins MD   Signed by:   Corwin Levins MD on 05/10/2008   Method used:   Print then Give to Patient   RxID:   [id number] PREDNISONE 10 MG TABS (PREDNISONE) 3po qd for 3days, then 2po qd for 3days, then 1po qd for 3days, then stop  #18 x 0   Entered and Authorized by:   Corwin Levins MD   Signed by:   Corwin Levins MD on 05/10/2008   Method used:   Print then Give to Patient   RxID:   [id number] PROMETHAZINE-CODEINE 6.25-10 MG/5ML SYRP (PROMETHAZINE-CODEINE) 1 tsp by mouth q 6 hrs as needed cough  #6 oz x 1   Entered and Authorized by:   Corwin Levins MD   Signed by:   Corwin Levins MD on 05/10/2008   Method used:   Print then Give to Patient   RxID:   [id number] AZITHROMYCIN 250 MG TABS (AZITHROMYCIN) 2po qd for 1 day, then 1po qd for 4days, then stop  #6 x 1   Entered and Authorized by:   Corwin Levins MD   Signed by:   Corwin Levins MD on 05/10/2008   Method used:   Print then Give to Patient   RxID:   [id number]      Medication Administration  Injection # 1:    Medication: Depo- Medrol 80mg     Diagnosis: ASTHMATIC BRONCHITIS, ACUTE (ICD-466.0)    Route: IM    Site: RUOQ gluteus    Exp Date: 03/20/2009    Lot #: [id number]    Mfr: teva    Patient tolerated injection without complications    Given by: Windell Norfolk (May 10, 2008 3:35 PM)  Injection # 2:    Medication: Depo- Medrol 40mg     Diagnosis: ASTHMATIC BRONCHITIS, ACUTE (ICD-466.0)    Route: IM    Site: RUOQ gluteus    Exp Date: 03/20/2009    Lot #: [id number]    Mfr: teva    Patient tolerated injection without complications    Given by: Windell Norfolk (May 10, 2008 3:35 PM)  Orders Added: 1)  Depo- Medrol 80mg  [J1040] 2)  Depo- Medrol 40mg  [J1030] 3)  Admin of Therapeutic Inj  intramuscular or subcutaneous [96372] 4)  Est. Patient Level IV [20254]

## 2010-02-19 NOTE — Progress Notes (Signed)
Summary: RF  Phone Note Refill Request Call back at [phone]   Refills Requested: Medication #1:  OXYCODONE HCL 5 MG TABS 1 by mouth a 6 hrs as needed pain Initial call taken by: Lamar Sprinkles,  August 31, 2008 12:08 PM  Follow-up for Phone Call        done hardcopy to [facility] side B - sara  Follow-up by: Corwin Levins MD,  August 31, 2008 12:49 PM

## 2010-02-19 NOTE — Progress Notes (Signed)
  Phone Note Refill Request Message from:  Fax from Pharmacy on October 23, 2009 10:28 AM  Refills Requested: Medication #1:  LISINOPRIL-HYDROCHLOROTHIAZIDE 20-12.5 MG TABS Take 2 tablet by mouth once a day   Dosage confirmed as above?Dosage Confirmed   Last Refilled: 05/2009   Notes: [facility] Initial call taken by: Robin Ewing CMA (AAMA),  October 23, 2009 10:29 AM    Prescriptions: LISINOPRIL-HYDROCHLOROTHIAZIDE 20-12.5 MG TABS (LISINOPRIL-HYDROCHLOROTHIAZIDE) Take 2 tablet by mouth once a day  #60 Each x 1   Entered by:   Scharlene Gloss CMA (AAMA)   Authorized by:   Corwin Levins MD   Signed by:   Scharlene Gloss CMA (AAMA) on 10/23/2009   Method used:   Faxed to ...       [facility] (retail)       [address]       [place], [place]  [id number]       Ph: [phone]       Fax: [phone]   RxID:   [id number]

## 2010-02-19 NOTE — Progress Notes (Signed)
Summary: OXYCODONE  Phone Note Refill Request Message from:  Patient on April 26, 2007 9:20 AM  Refills Requested: Medication #1:  OXYCODONE HCL 5 MG  TABS 1 by mouth qid prn pt requesting a rx  on pain medication OXYCODONE HCL 5 MG    Method Requested: Pick up at Office Initial call taken by: Shelbie Proctor,  April 26, 2007 9:21 AM  Follow-up for Phone Call        oxycodone not avail at the pharmacies now - will need to change to percocet 5/325 - done hardcopy Follow-up by: Corwin Levins MD,  April 26, 2007 12:45 PM  Additional Follow-up for Phone Call Additional follow up Details #1::        Rx  was left  at front office for pick up inform pt that rx was written for  percocet 5/325  Additional Follow-up by: Shelbie Proctor,  April 26, 2007 1:20 PM    New/Updated Medications: PERCOCET 5-325 MG  TABS (OXYCODONE-ACETAMINOPHEN) q by mouth qid prn   Prescriptions: PERCOCET 5-325 MG  TABS (OXYCODONE-ACETAMINOPHEN) q by mouth qid prn  #120 x 0   Entered and Authorized by:   Corwin Levins MD   Signed by:   Corwin Levins MD on 04/26/2007   Method used:   Print then Give to Patient   RxID:   [id number]

## 2010-02-19 NOTE — Progress Notes (Signed)
Summary: need  refill  Phone Note Call from Patient Call back at Home Phone [phone]   Caller: Patient/[phone] Call For: dr Jonny Ruiz Summary of Call: pt need the test strips for the one touch ulta mini ,and the pen needles to go with it  have been waiting per pt no response reguarding her refill need them sent to the pharmacy and want a call from the nurse to inform that they were sent it ,waiting since fri havent heard from the office Initial call taken by: Shelbie Proctor,  January 11, 2007 9:38 AM    New/Updated Medications: ONETOUCH TEST   STRP (GLUCOSE BLOOD) use as directed ONETOUCH FINEPOINT LANCETS   MISC (LANCETS) use as directed   Prescriptions: ONETOUCH FINEPOINT LANCETS   MISC (LANCETS) use as directed  #90 x 2   Entered by:   Maris Berger   Authorized by:   Corwin Levins MD   Signed by:   Maris Berger on 01/11/2007   Method used:   Electronically sent to ...       [facility]  [address]       [address]       [place], [place]  [address]       Ph: [phone]       Fax: [phone]   RxID:   [id number] [facility] TEST   STRP (GLUCOSE BLOOD) use as directed  #90 x 2   Entered by:   Maris Berger   Authorized by:   Corwin Levins MD   Signed by:   Maris Berger on 01/11/2007   Method used:   Electronically sent to ...       [facility]  [address]       [address]       [place], [place]  [address]       Ph: [phone]       Fax: [phone]   RxID:   [id number]

## 2010-02-19 NOTE — Assessment & Plan Note (Signed)
Summary: WENT TO ER SUNDAY/HAD A CT/ STOMACH PAIN IS WORSE/ [facility] $50   Vital Signs:  Patient Profile:   60 Years Old Female Weight:      239.13 pounds Temp:     98 .0 degrees F oral Pulse rate:   93 / minute BP sitting:   148 / 104  (right arm) Cuff size:   large  Vitals Entered By: Windell Norfolk (February 16, 2008 2:56 PM)                  Chief Complaint:  stomach pain.  History of Present Illness: overall doing ok except for left lat mid side burning and tingling type pain  without rash or swelling for 2 wks; no apparent trauma, fever  or other GI or GU symptoms; recent seen in ER with neg Ct abd/pelvis    Updated Prior Medication List: CARVEDILOL 6.25 MG TABS (CARVEDILOL) Take 1 tablet by mouth twice a day GLIMEPIRIDE 4 MG TABS (GLIMEPIRIDE) Take 1 tablet by mouth twice a day LEVOTHYROXINE SODIUM 50 MCG TABS (LEVOTHYROXINE SODIUM) Take 1 tablet by mouth once a day LISINOPRIL-HYDROCHLOROTHIAZIDE 20-12.5 MG TABS (LISINOPRIL-HYDROCHLOROTHIAZIDE) Take 2 tablet by mouth once a day PRAVASTATIN SODIUM 40 MG TABS (PRAVASTATIN SODIUM) Take 2 tablet by mouth once a day SERTRALINE HCL 100 MG  TABS (SERTRALINE HCL) 1 and 1/2 by mouth once daily ONETOUCH TEST   STRP (GLUCOSE BLOOD) use as directed ONETOUCH FINEPOINT LANCETS   MISC (LANCETS) use as directed METFORMIN HCL 500 MG  TABS (METFORMIN HCL) 1po qd CLONIDINE HCL 0.1 MG TABS (CLONIDINE HCL) one tab twice a day GABAPENTIN 300 MG CAPS (GABAPENTIN) 1 by mouth three times a day  Current Allergies (reviewed today): ! PENICILLIN V POTASSIUM (PENICILLIN V POTASSIUM) ! NSAIDS ! DARVOCET-N 100 (PROPOXYPHENE N-APAP) ! SEPTRA DS (SULFAMETHOXAZOLE-TRIMETHOPRIM) ! VICODIN  Past Medical History:    Reviewed history from 09/17/2006 and no changes required:       Diabetes mellitus, type II       Hypertension       Hypothyroidism       Hyperlipidemia       osa       lipoma - left shoulder       lipoma - right post neck         Past  Surgical History:    Reviewed history from 09/17/2006 and no changes required:       Hysterectomy       hernia repair '73   Family History:    Reviewed history from 01/27/2007 and no changes required:       Family History Hypertension       father with prostate cancer       mother with CHF, renal failure       son died wtih colon cancer       uncle and aunt with heart disease  Social History:    Reviewed history from 05/19/2007 and no changes required:       Alcohol use-no       Current Smoker       Conservation officer, [employer]       Married       2 children    Review of Systems  The patient denies anorexia, fever, weight loss, weight gain, vision loss, decreased hearing, hoarseness, chest pain, syncope, dyspnea on exertion, peripheral edema, prolonged cough, headaches, hemoptysis, melena, hematochezia, severe indigestion/heartburn, hematuria, incontinence, muscle weakness, suspicious skin lesions, difficulty walking, depression, unusual weight change, abnormal bleeding, enlarged  lymph nodes, angioedema, and breast masses.         all otherwise negative    Physical Exam  General:     alert and overweight-appearing.   Head:     Normocephalic and atraumatic without obvious abnormalities. No apparent alopecia or balding. Eyes:     No corneal or conjunctival inflammation noted. EOMI. Perrla.  Ears:     External ear exam shows no significant lesions or deformities.  Otoscopic examination reveals clear canals, tympanic membranes are intact bilaterally without bulging, retraction, inflammation or discharge. Hearing is grossly normal bilaterally. Nose:     External nasal examination shows no deformity or inflammation. Nasal mucosa are pink and moist without lesions or exudates. Mouth:     Oral mucosa and oropharynx without lesions or exudates.  Teeth in good repair. Neck:     No deformities, masses, or tenderness noted. Lungs:     Normal respiratory effort, chest expands symmetrically. Lungs are  clear to auscultation, no crackles or wheezes. Heart:     Normal rate and regular rhythm. S1 and S2 normal without gallop, murmur, click, rub or other extra sounds. Abdomen:     Bowel sounds positive,abdomen soft and non-tender without masses, organomegaly or hernias noted. Msk:     no joint tenderness and no joint swelling.   Extremities:     no edema, no ulcers  Neurologic:     cranial nerves II-XII intact and strength normal in all extremities.  , there does seem to be some dysestheia to left mid abd/side without rash or swelling    Impression & Recommendations:  Problem # 1:  Preventive Health Care (ICD-V70.0) Overall doing well, up to date, counseled on routine health concerns for screening and prevention, immunizations up to date or declined, labs ordered, for tetanus update today Orders: T-Vitamin D (25-Hydroxy) (81191-47829) TLB-BMP (Basic Metabolic Panel-BMET) (80048-METABOL) TLB-CBC Platelet - w/Differential (85025-CBCD) TLB-Hepatic/Liver Function Pnl (80076-HEPATIC) TLB-Lipid Panel (80061-LIPID) TLB-TSH (Thyroid Stimulating Hormone) (84443-TSH) TLB-Udip ONLY (81003-UDIP)   Problem # 2:  DIABETES MELLITUS, TYPE II (ICD-250.00)  Her updated medication list for this problem includes:    Glimepiride 4 Mg Tabs (Glimepiride) .Marland Kitchen... Take 1 tablet by mouth twice a day    Lisinopril-hydrochlorothiazide 20-12.5 Mg Tabs (Lisinopril-hydrochlorothiazide) .Marland Kitchen... Take 2 tablet by mouth once a day    Metformin Hcl 500 Mg Tabs (Metformin hcl) .Marland Kitchen... 1po qd  Labs Reviewed: HgBA1c: 7.6 (12/25/2006)   Creat: 0.8 (05/19/2007)   Microalbumin: 3.2 (03/02/2006)  Orders: TLB-Microalbumin/Creat Ratio, Urine (82043-MALB) TLB-A1C / Hgb A1C (Glycohemoglobin) (83036-A1C) stable overall by hx and exam, ok to continue meds/tx as is  - check labs  Problem # 3:  ABDOMINAL PAIN, UNSPECIFIED SITE (ICD-789.00) left mid lateral area, burning with dysethesia - prob neuritis - to try gabapentin  asd  Problem # 4:  HYPERTENSION (ICD-401.9)  The following medications were removed from the medication list:    Verapamil Hcl Cr 240 Mg Tbcr (Verapamil hcl) .Marland Kitchen... Take one (1) tablet by mouth every day    Verapamil Hcl 120 Mg Tabs (Verapamil hcl) .Marland Kitchen... Take 2 tablet by mouth twice a day  Her updated medication list for this problem includes:    Carvedilol 6.25 Mg Tabs (Carvedilol) .Marland Kitchen... Take 1 tablet by mouth twice a day    Lisinopril-hydrochlorothiazide 20-12.5 Mg Tabs (Lisinopril-hydrochlorothiazide) .Marland Kitchen... Take 2 tablet by mouth once a day    Clonidine Hcl 0.1 Mg Tabs (Clonidine hcl) ..... One tab twice a day  BP today: 148/104 Prior BP: 166/91 (05/19/2007)  Labs Reviewed: Creat: 0.8 (05/19/2007) Chol: 198 (12/25/2006)   HDL: 47.0 (12/25/2006)   LDL: 129 (12/25/2006)   TG: 110 (12/25/2006) BP persistent mild elev it seems, pt declines change in meds at this time; cont to monitor home BP's  Complete Medication List: 1)  Carvedilol 6.25 Mg Tabs (Carvedilol) .... Take 1 tablet by mouth twice a day 2)  Glimepiride 4 Mg Tabs (Glimepiride) .... Take 1 tablet by mouth twice a day 3)  Levothyroxine Sodium 50 Mcg Tabs (Levothyroxine sodium) .... Take 1 tablet by mouth once a day 4)  Lisinopril-hydrochlorothiazide 20-12.5 Mg Tabs (Lisinopril-hydrochlorothiazide) .... Take 2 tablet by mouth once a day 5)  Pravastatin Sodium 40 Mg Tabs (Pravastatin sodium) .... Take 2 tablet by mouth once a day 6)  Sertraline Hcl 100 Mg Tabs (Sertraline hcl) .Marland Kitchen.. 1 and 1/2 by mouth once daily 7)  Onetouch Test Strp (Glucose blood) .... Use as directed 8)  Onetouch Finepoint Lancets Misc (Lancets) .... Use as directed 9)  Metformin Hcl 500 Mg Tabs (Metformin hcl) .Marland Kitchen.. 1po qd 10)  Clonidine Hcl 0.1 Mg Tabs (Clonidine hcl) .... One tab twice a day 11)  Gabapentin 300 Mg Caps (Gabapentin) .Marland Kitchen.. 1 by mouth three times a day  Other Orders: H1N1 vaccine G code (E4540) Influenza A (H1N1) adm  fee Medicare/Non  Medicare [phone])   Patient Instructions: 1)  you received the tetanus shot today 2)  Please go to the Lab in the basement for your blood tests today 3)  start the gabapentin for pain - start 1 pill at night for 2 nights, then 1pill twice per day for 2 days, then 1 pill three times per day after that 4)  Continue all medications that you may have been taking previously  5)  Please schedule a follow-up appointment in 6 months with prio: 6)  BMP prior to visit, ICD-9: 250.02 7)  Lipid Panel prior to visit, ICD-9: 8)  HbgA1C prior to visit, ICD-9:   Prescriptions: LEVOTHYROXINE SODIUM 50 MCG TABS (LEVOTHYROXINE SODIUM) Take 1 tablet by mouth once a day  #30 Each x 11   Entered by:   Windell Norfolk   Authorized by:   Corwin Levins MD   Signed by:   Windell Norfolk on 02/16/2008   Method used:   Electronically to        [facility] [address] [phone]* (retail)       [address]       [place], [place]  [address]       Ph: [phone] or [phone]       Fax: [phone]   RxID:   [id number] GABAPENTIN 300 MG CAPS (GABAPENTIN) 1 by mouth three times a day  #90 x 11   Entered and Authorized by:   Corwin Levins MD   Signed by:   Corwin Levins MD on 02/16/2008   Method used:   Print then Give to Patient   RxID:   [id number]    Flu Vaccine Consent Questions    Do you have a history of severe allergic reactions to this vaccine? no    Any prior history of allergic reactions to egg and/or gelatin? no    Do you have a sensitivity to the preservative Thimersol? no    Do you have a past history of Guillan-Barre Syndrome? no    Do you currently have an acute febrile illness? no    Have you ever had  a severe reaction to latex? no    Vaccine information given and explained to patient? yes    Are you currently pregnant? no  H1N1 # 1    Vaccine Type: H1N1 vaccine G code    Site: right deltoid    Mfr: NOVARTIS    Dose: 0.5 ml    Route: IM    Given by: Windell Norfolk    Exp. Date: 05/19/2008    Lot #: [id number]

## 2010-02-19 NOTE — Progress Notes (Signed)
Summary: after hours  Phone Note Call from Patient   Caller: Patient Summary of Call: calls c/o R leg swelling and pain.  reports never took Indocin prescribed on 6/21.  out of Oxycodone also prescribed.  pt reports ankle 'hasn't changed- nothing's improved'.  pt reports she isn't able to sit and rest leg at work.  advised pt to take Indocin as directed, ice and elevate leg.  pt to call for f/u appt tomorrow. Initial call taken by: Neena Rhymes MD,  July 24, 2008 1:15 PM

## 2010-02-19 NOTE — Letter (Signed)
Summary: Out of Work  [facility]  [address] [place], [place] [address]   Phone: [phone]  Fax: [phone]    May 10, 2008   Employee:  [name] [facility]    To Whom It May Concern:   For Medical reasons, please excuse the above named employee from work for the following dates:  Start:   May 10, 2008  End:   May 13, 2008 ----Patient may return to work May 14, 2008  If you need additional information, please feel free to contact our office.         Sincerely,    Windell Norfolk

## 2010-02-19 NOTE — Progress Notes (Signed)
Summary: RX refill  Phone Note Call from Patient Call back at [facility] Phone [phone]   Caller: Patient Summary of Call: pt is requesting refills on her pain meds Initial call taken by: Margaret Pyle, CMA,  October 20, 2008 8:52 AM  Follow-up for Phone Call        done hardcopy to LIM side B - dahlia  Follow-up by: Corwin Levins MD,  October 20, 2008 12:55 PM  Additional Follow-up for Phone Call Additional follow up Details #1::        left message on machine for pt to return my call , rx in cabinet for pt pick up Additional Follow-up by: Margaret Pyle, CMA,  October 20, 2008 1:25 PM    Additional Follow-up for Phone Call Additional follow up Details #2::    pt aware Follow-up by: Margaret Pyle, CMA,  October 20, 2008 2:58 PM  New/Updated Medications: OXYCODONE HCL 5 MG TABS (OXYCODONE HCL) 1 by mouth a 6 hrs as needed pain - to fill Oct 22, 2008 Prescriptions: OXYCODONE HCL 5 MG TABS (OXYCODONE HCL) 1 by mouth a 6 hrs as needed pain - to fill Oct 22, 2008  #120 x 0   Entered and Authorized by:   Corwin Levins MD   Signed by:   Corwin Levins MD on 10/20/2008   Method used:   Print then Give to Patient   RxID:   [id number]

## 2010-02-19 NOTE — Assessment & Plan Note (Signed)
Summary: FU-$50-STC   Vital Signs:  Patient Profile:   60 Years Old Female Weight:      245 pounds Temp:     98.3 degrees F oral Pulse rate:   65 / minute BP sitting:   166 / 91  (right arm) Cuff size:   large  Pt. in pain?   no  Vitals Entered By: Maris Berger (May 19, 2007 4:10 PM)                  Chief Complaint:  leg problem/check blood sugar.  History of Present Illness: has to stand 8 hrs per day, legs hurts daily after work, veins seem to pop out, some worse with ambulation later in the day to both calves; no trauma or erythema    Updated Prior Medication List: VERAPAMIL HCL CR 240 MG TBCR (VERAPAMIL HCL) TAKE ONE (1) TABLET BY MOUTH EVERY DAY CARVEDILOL 6.25 MG TABS (CARVEDILOL) Take 1 tablet by mouth twice a day GLIMEPIRIDE 4 MG TABS (GLIMEPIRIDE) Take 1 tablet by mouth twice a day LEVOTHYROXINE SODIUM 50 MCG TABS (LEVOTHYROXINE SODIUM) Take 1 tablet by mouth once a day LISINOPRIL-HYDROCHLOROTHIAZIDE 20-12.5 MG TABS (LISINOPRIL-HYDROCHLOROTHIAZIDE) Take 2 tablet by mouth once a day PRAVASTATIN SODIUM 40 MG TABS (PRAVASTATIN SODIUM) Take 2 tablet by mouth once a day VERAPAMIL HCL 120 MG TABS (VERAPAMIL HCL) Take 2 tablet by mouth twice a day SERTRALINE HCL 100 MG  TABS (SERTRALINE HCL) 1 and 1/2 by mouth qd ONETOUCH TEST   STRP (GLUCOSE BLOOD) use as directed ONETOUCH FINEPOINT LANCETS   MISC (LANCETS) use as directed PERCOCET 10-325 MG  TABS (OXYCODONE-ACETAMINOPHEN) 1po qid prn METFORMIN HCL 500 MG  TABS (METFORMIN HCL) 1po qd  Current Allergies (reviewed today): ! PENICILLIN V POTASSIUM (PENICILLIN V POTASSIUM) ! NSAIDS ! DARVOCET-N 100 (PROPOXYPHENE N-APAP) ! SEPTRA DS (SULFAMETHOXAZOLE-TRIMETHOPRIM) ! VICODIN  Past Medical History:    Reviewed history from 09/17/2006 and no changes required:       Diabetes mellitus, type II       Hypertension       Hypothyroidism       Hyperlipidemia       osa       lipoma - left shoulder       lipoma  - right post neck         Past Surgical History:    Reviewed history from 09/17/2006 and no changes required:       Hysterectomy       hernia repair '73   Social History:    Reviewed history from 01/27/2007 and no changes required:       Alcohol use-no       Current Smoker       cashier       Married       2 children     Physical Exam  General:     Well-developed,well-nourished,in no acute distress; alert,appropriate and cooperative throughout examination Head:     Normocephalic and atraumatic without obvious abnormalities. No apparent alopecia or balding. Eyes:     No corneal or conjunctival inflammation noted. EOMI. Perrla Ears:     External ear exam shows no significant lesions or deformities.  Otoscopic examination reveals clear canals, tympanic membranes are intact bilaterally without bulging, retraction, inflammation or discharge. Hearing is grossly normal bilaterally. Nose:     External nasal examination shows no deformity or inflammation. Nasal mucosa are pink and moist without lesions or exudates. Mouth:     Oral  mucosa and oropharynx without lesions or exudates.  Teeth in good repair. Neck:     No deformities, masses, or tenderness noted. Lungs:     Normal respiratory effort, chest expands symmetrically. Lungs are clear to auscultation, no crackles or wheezes. Heart:     Normal rate and regular rhythm. S1 and S2 normal without gallop, murmur, click, rub or other extra sounds. Msk:     no obvious knee swelling, trace crepitus Pulses:     1+ bilat pedal Extremities:     no edema, no ulcers  Neurologic:     No cranial nerve deficits noted. Station and gait are normal. Plantar reflexes are down-going bilaterally. DTRs are symmetrical throughout. Sensory, motor and coordinative functions appear intact.    Impression & Recommendations:  Problem # 1:  LEG PAIN, BILATERAL (ICD-729.5) below the knees - ? related to simple overuse with standing, vs other - will  check knee films, and check LE art dopplers, increae the percocet to 10/325 qid prn Orders: T-Knee Comp Left 4 Views ([id number]) T-Knee Comp Right 4 Views [id number]) Cardiology Referral (Cardiology)   Problem # 2:  HYPERTENSION (ICD-401.9)  The following medications were removed from the medication list:    Hydrochlorothiazide 25 Mg Tabs (Hydrochlorothiazide) .Marland Kitchen... Take one (1) tablet by mouth every day    Toprol Xl 50 Mg Tb24 (Metoprolol succinate) .Marland Kitchen... Take one (1) tablet by mouth every day  Her updated medication list for this problem includes:    Verapamil Hcl Cr 240 Mg Tbcr (Verapamil hcl) .Marland Kitchen... Take one (1) tablet by mouth every day    Carvedilol 6.25 Mg Tabs (Carvedilol) .Marland Kitchen... Take 1 tablet by mouth twice a day    Lisinopril-hydrochlorothiazide 20-12.5 Mg Tabs (Lisinopril-hydrochlorothiazide) .Marland Kitchen... Take 2 tablet by mouth once a day    Verapamil Hcl 120 Mg Tabs (Verapamil hcl) .Marland Kitchen... Take 2 tablet by mouth twice a day  mild elev, likely situational BP today: 166/91 Prior BP: 165/94 (01/27/2007)  Labs Reviewed: Creat: 0.8 (05/19/2007) Chol: 198 (12/25/2006)   HDL: 47.0 (12/25/2006)   LDL: 129 (12/25/2006)   TG: 110 (12/25/2006)   Complete Medication List: 1)  Verapamil Hcl Cr 240 Mg Tbcr (Verapamil hcl) .... Take one (1) tablet by mouth every day 2)  Carvedilol 6.25 Mg Tabs (Carvedilol) .... Take 1 tablet by mouth twice a day 3)  Glimepiride 4 Mg Tabs (Glimepiride) .... Take 1 tablet by mouth twice a day 4)  Levothyroxine Sodium 50 Mcg Tabs (Levothyroxine sodium) .... Take 1 tablet by mouth once a day 5)  Lisinopril-hydrochlorothiazide 20-12.5 Mg Tabs (Lisinopril-hydrochlorothiazide) .... Take 2 tablet by mouth once a day 6)  Pravastatin Sodium 40 Mg Tabs (Pravastatin sodium) .... Take 2 tablet by mouth once a day 7)  Verapamil Hcl 120 Mg Tabs (Verapamil hcl) .... Take 2 tablet by mouth twice a day 8)  Sertraline Hcl 100 Mg Tabs (Sertraline hcl) .Marland Kitchen.. 1 and 1/2 by mouth qd 9)   Onetouch Test Strp (Glucose blood) .... Use as directed 10)  Onetouch Finepoint Lancets Misc (Lancets) .... Use as directed 11)  Percocet 10-325 Mg Tabs (Oxycodone-acetaminophen) .Marland Kitchen.. 1po qid prn 12)  Metformin Hcl 500 Mg Tabs (Metformin hcl) .Marland Kitchen.. 1po qd   Patient Instructions: 1)  increase the percocet to 10/325 - up to four times per day as needed 2)  you will be contacted about the referral(s) to: Leg circulation test 3)  you will have knee xrays today 4)  continue all medications that you may have been taking  previously 5)  Please schedule a follow-up appointment as needed.    Prescriptions: PERCOCET 10-325 MG  TABS (OXYCODONE-ACETAMINOPHEN) 1po qid prn  #120 x 0   Entered and Authorized by:   Corwin Levins MD   Signed by:   Corwin Levins MD on 05/19/2007   Method used:   Print then Give to Patient   RxID:   [id number]  ]

## 2010-02-19 NOTE — Progress Notes (Signed)
Summary: pain med  Phone Note Call from Patient Call back at Home Phone [phone]   Caller: Patient Call For: dr Jonny Ruiz Summary of Call: need pain medication oxycodone 5mg   Initial call taken by: Shelbie Proctor,  February 25, 2007 3:34 PM  Follow-up for Phone Call        ok - done hardcopy Follow-up by: Corwin Levins MD,  February 25, 2007 4:54 PM  Additional Follow-up for Phone Call Additional follow up Details #1::        February 26, 2007 8:32 AM called pt to inform spoke with pt February 26, 2007 8:32 AM that rx will be at front desk for pick up   Additional Follow-up by: Shelbie Proctor,  February 26, 2007 8:33 AM      Prescriptions: OXYCODONE HCL 5 MG  TABS (OXYCODONE HCL) 1 by mouth qid prn  #120 x 0   Entered and Authorized by:   Corwin Levins MD   Signed by:   Corwin Levins MD on 02/25/2007   Method used:   Print then Give to Patient   RxID:   [id number]

## 2010-02-19 NOTE — Miscellaneous (Signed)
Summary: lisinopril  Clinical Lists Changes  Medications: Rx of LISINOPRIL-HYDROCHLOROTHIAZIDE 20-12.5 MG TABS (LISINOPRIL-HYDROCHLOROTHIAZIDE) Take 2 tablet by mouth once a day;  #60 Each x 6;  Signed;  Entered by: Orlan Leavens;  Authorized by: Corwin Levins MD;  Method used: Electronic    Prescriptions: LISINOPRIL-HYDROCHLOROTHIAZIDE 20-12.5 MG TABS (LISINOPRIL-HYDROCHLOROTHIAZIDE) Take 2 tablet by mouth once a day  #60 Each x 6   Entered by:   Orlan Leavens   Authorized by:   Corwin Levins MD   Signed by:   Orlan Leavens on 07/27/2007   Method used:   Electronically sent to ...       [address]       [address]       [place], [place]  [address]       Ph: [phone]       Fax: [phone]   RxID:   [id number]

## 2010-02-19 NOTE — Assessment & Plan Note (Signed)
Summary: PAINFUL R FOOT-COLD SYMPTOMS--$50--STC   Vital Signs:  Patient profile:   60 year old female Height:      65.5 inches Weight:      233 pounds BMI:     38.32 Temp:     98.6 degrees F oral Pulse rate:   67 / minute BP sitting:   138 / 102  (left arm) Cuff size:   large  Vitals Entered By: Bill Salinas CMA (July 25, 2008 4:59 PM) CC: pt c/o pain in right foot and has had cold symptoms for 2 days/ ab   CC:  pt c/o pain in right foot and has had cold symptoms for 2 days/ ab.  History of Present Illness: here unfortutely with worsening right ankle pain and swelling,diffuse ankle sweling and tenderness with pain 9/10 just to walk; is OK with ortho referral this time, recent films neg for fracture, and prednisone no help at all; did not try the indocin, advil no help; denies fever or further trauma; no falls and still is able to get by at work as she can sit but walkng or standing now very difficult; [facility]; also incidently with acute onset 3 dayslow grade fever, ST and prod cough with greenish sputum, without sob, doe, wheezing, orthopnea, pnd or LE  edema  Problems Prior to Update: 1)  Lipoma  (ICD-214.9) 2)  Ankle Pain, Right  (ICD-719.47) 3)  Gout  (ICD-274.9) 4)  Asthmatic Bronchitis, Acute  (ICD-466.0) 5)  Abdominal Pain, Unspecified Site  (ICD-789.00) 6)  Preventive Health Care  (ICD-V70.0) 7)  Knee Pain, Bilateral  (ICD-719.46) 8)  Leg Pain, Bilateral  (ICD-729.5) 9)  Depressive Disorder  (ICD-311) 10)  Thumb Pain  (ICD-729.5) 11)  Hernia, Site Nos w/o Obstruction/gangrene  (ICD-553.9) 12)  Lipoma Nos  (ICD-214.9) 13)  Degenerative Joint Disease, Hands  (ICD-715.94) 14)  Obstructive Sleep Apnea  (ICD-327.23) 15)  Hyperlipidemia  (ICD-272.4) 16)  Hypothyroidism  (ICD-244.9) 17)  Pacemaker, Permanent  (ICD-V45.01) 18)  Bradycardia, Chronic  (ICD-427.89) 19)  Hypertension  (ICD-401.9) 20)  Diabetes Mellitus, Type II   (ICD-250.00)  Medications Prior to Update: 1)  Carvedilol 12.5 Mg Tabs (Carvedilol) .... Take One Tablet By Mouth Twice A Day 2)  Glimepiride 4 Mg Tabs (Glimepiride) .... Take 1 Tablet By Mouth Twice A Day 3)  Levothyroxine Sodium 50 Mcg Tabs (Levothyroxine Sodium) .... Take 1 Tablet By Mouth Once A Day 4)  Lisinopril-Hydrochlorothiazide 20-12.5 Mg Tabs (Lisinopril-Hydrochlorothiazide) .... Take 2 Tablet By Mouth Once A Day 5)  Pravastatin Sodium 40 Mg Tabs (Pravastatin Sodium) .... Take 2 Tablet By Mouth Once A Day 6)  Sertraline Hcl 100 Mg  Tabs (Sertraline Hcl) .Marland Kitchen.. 1 and 1/2 By Mouth Once Daily 7)  Onetouch Test   Strp (Glucose Blood) .... Use As Directed 8)  Onetouch Finepoint Lancets   Misc (Lancets) .... Use As Directed 9)  Metformin Hcl 500 Mg  Tabs (Metformin Hcl) .Marland Kitchen.. 1 By Mouth Two Times A Day 10)  Clonidine Hcl 0.1 Mg Tabs (Clonidine Hcl) .... One Tab Twice A Day 11)  Klor-Con M20 20 Meq Cr-Tabs (Potassium Chloride Crys Cr) .... Take 1 Two Times A Day 12)  Prednisone 10 Mg Tabs (Prednisone) .... 4po Qd For 3days, Then 3po Qd For 3days, Then 2po Qd For 3days, Then 1po Qd For 3 Days, Then Stop 13)  Oxycodone Hcl 5 Mg Tabs (Oxycodone Hcl) .Marland Kitchen.. 1 By Mouth A 6 Hrs As Needed Pain  14)  Indomethacin 50 Mg Caps (Indomethacin) .Marland Kitchen.. 1 By Mouth Three Times A Day As Needed  Current Medications (verified): 1)  Carvedilol 12.5 Mg Tabs (Carvedilol) .... Take One Tablet By Mouth Twice A Day 2)  Glimepiride 4 Mg Tabs (Glimepiride) .... Take 1 Tablet By Mouth Twice A Day 3)  Levothyroxine Sodium 50 Mcg Tabs (Levothyroxine Sodium) .... Take 1 Tablet By Mouth Once A Day 4)  Lisinopril-Hydrochlorothiazide 20-12.5 Mg Tabs (Lisinopril-Hydrochlorothiazide) .... Take 2 Tablet By Mouth Once A Day 5)  Pravastatin Sodium 40 Mg Tabs (Pravastatin Sodium) .... Take 2 Tablet By Mouth Once A Day 6)  Sertraline Hcl 100 Mg  Tabs (Sertraline Hcl) .Marland Kitchen.. 1 and 1/2 By Mouth Once Daily 7)  Onetouch Test   Strp (Glucose  Blood) .... Use As Directed 8)  Onetouch Finepoint Lancets   Misc (Lancets) .... Use As Directed 9)  Metformin Hcl 500 Mg  Tabs (Metformin Hcl) .Marland Kitchen.. 1 By Mouth Two Times A Day 10)  Clonidine Hcl 0.1 Mg Tabs (Clonidine Hcl) .... One Tab Twice A Day 11)  Klor-Con M20 20 Meq Cr-Tabs (Potassium Chloride Crys Cr) .... Take 1 Two Times A Day 12)  Oxycodone Hcl 5 Mg Tabs (Oxycodone Hcl) .Marland Kitchen.. 1 By Mouth A 6 Hrs As Needed Pain 13)  Indomethacin 50 Mg Caps (Indomethacin) .Marland Kitchen.. 1 By Mouth Three Times A Day As Needed 14)  Azithromycin 250 Mg Tabs (Azithromycin) .... 2po Qd For 1 Day, Then 1po Qd For 4days, Then Stop  Allergies (verified): 1)  ! Penicillin V Potassium (Penicillin V Potassium) 2)  ! Nsaids 3)  ! Darvocet-N 100 (Propoxyphene N-Apap) 4)  ! Septra Ds (Sulfamethoxazole-Trimethoprim) 5)  ! Vicodin 6)  Codeine Phosphate (Codeine Phosphate) 7)  Naprosyn (Naproxen)  Past History:  Past Medical History: Last updated: 07/10/2008 ABDOMINAL PAIN, UNSPECIFIED SITE (ICD-789.00) PREVENTIVE HEALTH CARE (ICD-V70.0) KNEE PAIN, BILATERAL (ICD-719.46) LEG PAIN, BILATERAL (ICD-729.5) DEPRESSIVE DISORDER (ICD-311) THUMB PAIN (ICD-729.5) HERNIA, SITE NOS W/O OBSTRUCTION/GANGRENE (ICD-553.9) LIPOMA NOS (ICD-214.9) DEGENERATIVE JOINT DISEASE, HANDS (ICD-715.94) OBSTRUCTIVE SLEEP APNEA (ICD-327.23) HYPERLIPIDEMIA (ICD-272.4) HYPOTHYROIDISM (ICD-244.9) PACEMAKER, PERMANENT (ICD-V45.01) BRADYCARDIA, CHRONIC (ICD-427.89) HYPERTENSION (ICD-401.9) DIABETES MELLITUS, TYPE II (ICD-250.00) Gout  Past Surgical History: Last updated: 04/22/2008 Hysterectomy hernia repair '73  Removal of previous implanted pacemaker, which is   a ERI, insertion of a new dual-chamber pacemaker with pacemaker pocket   revision.  05/25/2007  [name]. 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  , mild ill  Head:  normocephalic and atraumatic.   Eyes:  vision grossly intact, pupils equal, and pupils round.   Ears:  bilat tms' red, sinus nontender Nose:  nasal dischargemucosal pallor and mucosal erythema.   Mouth:  pharyngeal erythema and fair dentition.   Neck:  supple and cervical lymphadenopathy.   Lungs:  normal respiratory effort and normal breath sounds.   Heart:  normal rate and regular rhythm.   Extremities:  no edema, no ulcers    Impression & Recommendations:  Problem # 1:  ANKLE PAIN, RIGHT (ICD-719.47)  severe, tx with pain meds, refer ortho per pt  Orders: Orthopedic Surgeon Referral (Ortho Surgeon)  Problem # 2:  BRONCHITIS-ACUTE (ICD-466.0)  Her updated medication list for this problem includes:    Azithromycin 250 Mg Tabs (Azithromycin) .Marland Kitchen... 2po qd  for 1 day, then 1po qd for 4days, then stop treat as above, f/u any worsening signs or symptoms   Complete Medication List: 1)  Carvedilol 12.5 Mg Tabs (Carvedilol) .... Take one tablet by mouth twice a day 2)  Glimepiride 4 Mg Tabs (Glimepiride) .... Take 1 tablet by mouth twice a day 3)  Levothyroxine Sodium 50 Mcg Tabs (Levothyroxine sodium) .... Take 1 tablet by mouth once a day 4)  Lisinopril-hydrochlorothiazide 20-12.5 Mg Tabs (Lisinopril-hydrochlorothiazide) .... Take 2 tablet by mouth once a day 5)  Pravastatin Sodium 40 Mg Tabs (Pravastatin sodium) .... Take 2 tablet by mouth once a day 6)  Sertraline Hcl 100 Mg Tabs (Sertraline hcl) .Marland Kitchen.. 1 and 1/2 by mouth once daily 7)  Onetouch Test Strp (Glucose blood) .... Use as directed 8)  Onetouch Finepoint Lancets Misc (Lancets) .... Use as directed 9)  Metformin Hcl 500 Mg Tabs (Metformin hcl) .Marland Kitchen.. 1 by mouth two times a day 10)  Clonidine Hcl 0.1 Mg Tabs (Clonidine hcl) .... One tab twice a day 11)  Klor-con M20 20 Meq Cr-tabs  (Potassium chloride crys cr) .... Take 1 two times a day 12)  Oxycodone Hcl 5 Mg Tabs (Oxycodone hcl) .Marland Kitchen.. 1 by mouth a 6 hrs as needed pain 13)  Indomethacin 50 Mg Caps (Indomethacin) .Marland Kitchen.. 1 by mouth three times a day as needed 14)  Azithromycin 250 Mg Tabs (Azithromycin) .... 2po qd for 1 day, then 1po qd for 4days, then stop  Patient Instructions: 1)  Please take all new medications as prescribed 2)  You can also use Mucinex OTC for congestion , or Delsym OTC for cough 3)  Continue all medications that you may have been taking previously  4)  You will be contacted about the referral(s) to: Orthopedic at [facility] 5)  please keep your lab appt on july 19 as planned 6)  please keep you officie visit appt on July 26 as planned Prescriptions: AZITHROMYCIN 250 MG TABS (AZITHROMYCIN) 2po qd for 1 day, then 1po qd for 4days, then stop  #6 x 1   Entered and Authorized by:   Corwin Levins MD   Signed by:   Corwin Levins MD on 07/25/2008   Method used:   Print then Give to Patient   RxID:   [id number] OXYCODONE HCL 5 MG TABS (OXYCODONE HCL) 1 by mouth a 6 hrs as needed pain  #80 x 0   Entered and Authorized by:   Corwin Levins MD   Signed by:   Corwin Levins MD on 07/25/2008   Method used:   Print then Give to Patient   RxID:   [id number]

## 2010-02-19 NOTE — Assessment & Plan Note (Signed)
Summary: FU-STC  Medications Added AVELOX 400 MG TABS (MOXIFLOXACIN HCL) TAKE ONE (1) TABLET BY MOUTH EVERY DAY DURATUSS AC 12 15-12.5-15 MG/5ML SUSP (PE HCL-DIPHENHYD HCL-DM HBRTAN) TAKE ONE TEASPOONFUL EVERY 12 HOURS GLIMEPIRIDE 4 MG TABS (GLIMEPIRIDE) TAKE ONE TABLET BY MOUTH EVERY MORNING HYDROCHLOROTHIAZIDE 25 MG TABS (HYDROCHLOROTHIAZIDE) TAKE ONE (1) TABLET BY MOUTH EVERY DAY PROTONIX 40 MG TBEC (PANTOPRAZOLE SODIUM) TAKE ONE (1) TABLET BY MOUTH EVERY DAY TOPROL XL 50 MG TB24 (METOPROLOL SUCCINATE) TAKE ONE (1) TABLET BY MOUTH EVERY DAY VERAPAMIL HCL CR 240 MG TBCR (VERAPAMIL HCL) TAKE ONE (1) TABLET BY MOUTH EVERY DAY CARVEDILOL 6.25 MG TABS (CARVEDILOL) Take 1 tablet by mouth twice a day DICLOFENAC SODIUM 75 MG TBEC (DICLOFENAC SODIUM) Take 1 tablet by mouth twice a day GLIMEPIRIDE 4 MG TABS (GLIMEPIRIDE) Take 1 tablet by mouth twice a day LEVOTHYROXINE SODIUM 50 MCG TABS (LEVOTHYROXINE SODIUM) Take 1 tablet by mouth once a day LISINOPRIL-HYDROCHLOROTHIAZIDE 20-12.5 MG TABS (LISINOPRIL-HYDROCHLOROTHIAZIDE) Take 2 tablet by mouth once a day PRAVASTATIN SODIUM 40 MG TABS (PRAVASTATIN SODIUM) Take 2 tablet by mouth once a day VERAPAMIL HCL 120 MG TABS (VERAPAMIL HCL) Take 2 tablet by mouth twice a day SERTRALINE HCL 100 MG  TABS (SERTRALINE HCL) 1 and 1/2 by mouth qd VICODIN 5-500 MG  TABS (HYDROCODONE-ACETAMINOPHEN) 1po qid prn        Vital Signs:  Patient Profile:   60 Years Old Female Weight:      253 pounds Temp:     99.3 degrees F Pulse rate:   60 / minute BP sitting:   161 / 93  (right arm)  Pt. in pain?   no  Vitals Entered By: Maris Berger (December 25, 2006 3:31 PM)                  Chief Complaint:  right hand swollen.  History of Present Illness: s/p steroid shot to right thumb per ortho - she can't do surgury now b/c she has to work b/c husband not working now; needs pain med - tramadol no help in the past; BP at home has been normal; has been  more depressed lately but not suicidal  Current Allergies (reviewed today): ! PENICILLIN V POTASSIUM (PENICILLIN V POTASSIUM) ! NSAIDS ! DARVOCET-N 100 (PROPOXYPHENE N-APAP) ! SEPTRA DS (SULFAMETHOXAZOLE-TRIMETHOPRIM) Updated/Current Medications (including changes made in today's visit):  GLIMEPIRIDE 4 MG TABS (GLIMEPIRIDE) TAKE ONE TABLET BY MOUTH EVERY MORNING HYDROCHLOROTHIAZIDE 25 MG TABS (HYDROCHLOROTHIAZIDE) TAKE ONE (1) TABLET BY MOUTH EVERY DAY PROTONIX 40 MG TBEC (PANTOPRAZOLE SODIUM) TAKE ONE (1) TABLET BY MOUTH EVERY DAY TOPROL XL 50 MG TB24 (METOPROLOL SUCCINATE) TAKE ONE (1) TABLET BY MOUTH EVERY DAY VERAPAMIL HCL CR 240 MG TBCR (VERAPAMIL HCL) TAKE ONE (1) TABLET BY MOUTH EVERY DAY CARVEDILOL 6.25 MG TABS (CARVEDILOL) Take 1 tablet by mouth twice a day DICLOFENAC SODIUM 75 MG TBEC (DICLOFENAC SODIUM) Take 1 tablet by mouth twice a day GLIMEPIRIDE 4 MG TABS (GLIMEPIRIDE) Take 1 tablet by mouth twice a day LEVOTHYROXINE SODIUM 50 MCG TABS (LEVOTHYROXINE SODIUM) Take 1 tablet by mouth once a day LISINOPRIL-HYDROCHLOROTHIAZIDE 20-12.5 MG TABS (LISINOPRIL-HYDROCHLOROTHIAZIDE) Take 2 tablet by mouth once a day PRAVASTATIN SODIUM 40 MG TABS (PRAVASTATIN SODIUM) Take 2 tablet by mouth once a day VERAPAMIL HCL 120 MG TABS (VERAPAMIL HCL) Take 2 tablet by mouth twice a day SERTRALINE HCL 100 MG  TABS (SERTRALINE HCL) 1 and 1/2 by mouth qd VICODIN 5-500 MG  TABS (HYDROCODONE-ACETAMINOPHEN) 1po qid prn  Past Medical History:    Reviewed history from 09/17/2006 and no changes required:       Diabetes mellitus, type II       Hypertension       Hypothyroidism       Hyperlipidemia       osa       lipoma - left shoulder       lipoma - right post neck         Past Surgical History:    Reviewed history from 09/17/2006 and no changes required:       Hysterectomy       hernia repair '73   Family History:    Reviewed history and no changes required:       Family History  Hypertension  Social History:    Reviewed history and no changes required:       Alcohol use-no   Risk Factors:  Alcohol use:  no    Physical Exam  General:     morbid obese Head:     Normocephalic and atraumatic without obvious abnormalities. No apparent alopecia or balding. Eyes:     No corneal or conjunctival inflammation noted. EOMI. Perrla.  Ears:     External ear exam shows no significant lesions or deformities.  Otoscopic examination reveals clear canals, tympanic membranes are intact bilaterally without bulging, retraction, inflammation or discharge. Hearing is grossly normal bilaterally. Nose:     External nasal examination shows no deformity or inflammation. Nasal mucosa are pink and moist without lesions or exudates. Mouth:     Oral mucosa and oropharynx without lesions or exudates.  Teeth in good repair. Neck:     No deformities, masses, or tenderness noted. Lungs:     Normal respiratory effort, chest expands symmetrically. Lungs are clear to auscultation, no crackles or wheezes. Heart:     Normal rate and regular rhythm. S1 and S2 normal without gallop, murmur, click, rub or other extra sounds. Extremities:     right thumb tender but no significant swelling or erythema    Impression & Recommendations:  Problem # 1:  THUMB PAIN (ICD-729.5) marked pain, for surgury eventually but she cannot afford the money and time at this time; tx with vicodin 1 qid prn  Problem # 2:  DEPRESSIVE DISORDER (ICD-311)  Her updated medication list for this problem includes:    Sertraline Hcl 100 Mg Tabs (Sertraline hcl) .Marland Kitchen... 1 and 1/2 by mouth qd  tx as above,f/u 4 wks or prn  Problem # 3:  DIABETES MELLITUS, TYPE II (ICD-250.00)  Her updated medication list for this problem includes:    Glimepiride 4 Mg Tabs (Glimepiride) .Marland Kitchen... Take one tablet by mouth every morning    Glimepiride 4 Mg Tabs (Glimepiride) .Marland Kitchen... Take 1 tablet by mouth twice a day     Lisinopril-hydrochlorothiazide 20-12.5 Mg Tabs (Lisinopril-hydrochlorothiazide) .Marland Kitchen... Take 2 tablet by mouth once a day  check routine labs Orders: TLB-A1C / Hgb A1C (Glycohemoglobin) (83036-A1C) TLB-BMP (Basic Metabolic Panel-BMET) (80048-METABOL) TLB-Lipid Panel (80061-LIPID)   Problem # 4:  HYPERTENSION (ICD-401.9)  Her updated medication list for this problem includes:    Hydrochlorothiazide 25 Mg Tabs (Hydrochlorothiazide) .Marland Kitchen... Take one (1) tablet by mouth every day    Toprol Xl 50 Mg Tb24 (Metoprolol succinate) .Marland Kitchen... Take one (1) tablet by mouth every day    Verapamil Hcl Cr 240 Mg Tbcr (Verapamil hcl) .Marland Kitchen... Take one (1) tablet by mouth every day    Carvedilol 6.25 Mg Tabs (Carvedilol) .Marland KitchenMarland KitchenMarland KitchenMarland Kitchen  Take 1 tablet by mouth twice a day    Lisinopril-hydrochlorothiazide 20-12.5 Mg Tabs (Lisinopril-hydrochlorothiazide) .Marland Kitchen... Take 2 tablet by mouth once a day    Verapamil Hcl 120 Mg Tabs (Verapamil hcl) .Marland Kitchen... Take 2 tablet by mouth twice a day  tx as above   Complete Medication List: 1)  Glimepiride 4 Mg Tabs (Glimepiride) .... Take one tablet by mouth every morning 2)  Hydrochlorothiazide 25 Mg Tabs (Hydrochlorothiazide) .... Take one (1) tablet by mouth every day 3)  Protonix 40 Mg Tbec (Pantoprazole sodium) .... Take one (1) tablet by mouth every day 4)  Toprol Xl 50 Mg Tb24 (Metoprolol succinate) .... Take one (1) tablet by mouth every day 5)  Verapamil Hcl Cr 240 Mg Tbcr (Verapamil hcl) .... Take one (1) tablet by mouth every day 6)  Carvedilol 6.25 Mg Tabs (Carvedilol) .... Take 1 tablet by mouth twice a day 7)  Diclofenac Sodium 75 Mg Tbec (Diclofenac sodium) .... Take 1 tablet by mouth twice a day 8)  Glimepiride 4 Mg Tabs (Glimepiride) .... Take 1 tablet by mouth twice a day 9)  Levothyroxine Sodium 50 Mcg Tabs (Levothyroxine sodium) .... Take 1 tablet by mouth once a day 10)  Lisinopril-hydrochlorothiazide 20-12.5 Mg Tabs (Lisinopril-hydrochlorothiazide) .... Take 2 tablet by  mouth once a day 11)  Pravastatin Sodium 40 Mg Tabs (Pravastatin sodium) .... Take 2 tablet by mouth once a day 12)  Verapamil Hcl 120 Mg Tabs (Verapamil hcl) .... Take 2 tablet by mouth twice a day 13)  Sertraline Hcl 100 Mg Tabs (Sertraline hcl) .Marland Kitchen.. 1 and 1/2 by mouth qd 14)  Vicodin 5-500 Mg Tabs (Hydrocodone-acetaminophen) .Marland Kitchen.. 1po qid prn   Patient Instructions: 1)  Take the zoloft (setraline) starting with half of the 100 mg pill for 3 days, then 1 pill for day for 3 day, then 1 and 1/2 pills per day after that 2)  You will have blood work today 3)  Please schedule a follow-up appointment in 6 months with CPX labs and HGBA1c (250.02)    Prescriptions: VICODIN 5-500 MG  TABS (HYDROCODONE-ACETAMINOPHEN) 1po qid prn  #120 x 1   Entered and Authorized by:   Corwin Levins MD   Signed by:   Corwin Levins MD on 12/25/2006   Method used:   Print then Give to Patient   RxID:   [id number] SERTRALINE HCL 100 MG  TABS (SERTRALINE HCL) 1 and 1/2 by mouth qd  #45 x 11   Entered and Authorized by:   Corwin Levins MD   Signed by:   Corwin Levins MD on 12/25/2006   Method used:   Print then Give to Patient   RxID:   [id number]  ]

## 2010-02-19 NOTE — Progress Notes (Signed)
Summary: ENT  Phone Note Call from Patient   Caller: Patient/ [phone] Summary of Call: Lost sheet with appt for ENT. Req call back to get appt information pls Initial call taken by: Orlan Leavens,  September 08, 2008 2:33 PM  Follow-up for Phone Call        Called pt back per EMR appt is 09/12/08 @ 09:40 with dr. Pollyann Kennedy Follow-up by: Orlan Leavens,  September 08, 2008 3:17 PM

## 2010-02-19 NOTE — Assessment & Plan Note (Signed)
Summary: FU-STC   Vital Signs:  Patient Profile:   60 Years Old Female Weight:      251 pounds Temp:     100.6 degrees F oral Pulse rate:   65 / minute BP sitting:   165 / 94  (right arm) Cuff size:   large  Pt. in pain?   no  Vitals Entered By: Maris Berger (January 27, 2007 2:18 PM)                  Chief Complaint:  still has problem w/ right hand/pain med made her sick.  History of Present Illness: vicodin caused GI upset so unable to take; sugars later in the day up to 170 or so; zoloft working very well - wants to continue as is; has enlarging skin lesion to the right arm; denies any source of fever today such at ST or gu symptoms  Current Allergies (reviewed today): ! PENICILLIN V POTASSIUM (PENICILLIN V POTASSIUM) ! NSAIDS ! DARVOCET-N 100 (PROPOXYPHENE N-APAP) ! SEPTRA DS (SULFAMETHOXAZOLE-TRIMETHOPRIM) ! VICODIN Updated/Current Medications (including changes made in today's visit):  HYDROCHLOROTHIAZIDE 25 MG TABS (HYDROCHLOROTHIAZIDE) TAKE ONE (1) TABLET BY MOUTH EVERY DAY PROTONIX 40 MG TBEC (PANTOPRAZOLE SODIUM) TAKE ONE (1) TABLET BY MOUTH EVERY DAY TOPROL XL 50 MG TB24 (METOPROLOL SUCCINATE) TAKE ONE (1) TABLET BY MOUTH EVERY DAY VERAPAMIL HCL CR 240 MG TBCR (VERAPAMIL HCL) TAKE ONE (1) TABLET BY MOUTH EVERY DAY CARVEDILOL 6.25 MG TABS (CARVEDILOL) Take 1 tablet by mouth twice a day DICLOFENAC SODIUM 75 MG TBEC (DICLOFENAC SODIUM) Take 1 tablet by mouth twice a day GLIMEPIRIDE 4 MG TABS (GLIMEPIRIDE) Take 1 tablet by mouth twice a day LEVOTHYROXINE SODIUM 50 MCG TABS (LEVOTHYROXINE SODIUM) Take 1 tablet by mouth once a day LISINOPRIL-HYDROCHLOROTHIAZIDE 20-12.5 MG TABS (LISINOPRIL-HYDROCHLOROTHIAZIDE) Take 2 tablet by mouth once a day PRAVASTATIN SODIUM 40 MG TABS (PRAVASTATIN SODIUM) Take 2 tablet by mouth once a day VERAPAMIL HCL 120 MG TABS (VERAPAMIL HCL) Take 2 tablet by mouth twice a day SERTRALINE HCL 100 MG  TABS (SERTRALINE HCL) 1 and 1/2  by mouth qd VICODIN 5-500 MG  TABS (HYDROCODONE-ACETAMINOPHEN) 1po qid prn ONETOUCH TEST   STRP (GLUCOSE BLOOD) use as directed ONETOUCH FINEPOINT LANCETS   MISC (LANCETS) use as directed OXYCODONE HCL 5 MG  TABS (OXYCODONE HCL) 1 by mouth qid prn METFORMIN HCL 500 MG  TABS (METFORMIN HCL) 1po qd   Past Medical History:    Reviewed history from 09/17/2006 and no changes required:       Diabetes mellitus, type II       Hypertension       Hypothyroidism       Hyperlipidemia       osa       lipoma - left shoulder       lipoma - right post neck         Past Surgical History:    Reviewed history from 09/17/2006 and no changes required:       Hysterectomy       hernia repair '73   Family History:    Reviewed history from 12/25/2006 and no changes required:       Family History Hypertension       father with prostate cancer       mother with CHF, renal failure       son died wtih colon cancer       uncle and aunt with heart disease  Social History:  Reviewed history from 12/25/2006 and no changes required:       Alcohol use-no       Current Smoker   Risk Factors:  Tobacco use:  current    Physical Exam  General:     Well-developed,well-nourished,in no acute distress; alert,appropriate and cooperative throughout examination Head:     Normocephalic and atraumatic without obvious abnormalities. No apparent alopecia or balding. Eyes:     No corneal or conjunctival inflammation noted. EOMI. Perrla Ears:     External ear exam shows no significant lesions or deformities.  Otoscopic examination reveals clear canals, tympanic membranes are intact bilaterally without bulging, retraction, inflammation or discharge. Hearing is grossly normal bilaterally. Nose:     External nasal examination shows no deformity or inflammation. Nasal mucosa are pink and moist without lesions or exudates. Mouth:     Oral mucosa and oropharynx without lesions or exudates.  Teeth in good  repair. Neck:     No deformities, masses, or tenderness noted. Lungs:     Normal respiratory effort, chest expands symmetrically. Lungs are clear to auscultation, no crackles or wheezes. Heart:     Normal rate and regular rhythm. S1 and S2 normal without gallop, murmur, click, rub or other extra sounds. Msk:     marked tender right thumb and wrist area Neurologic:     No cranial nerve deficits noted. Station and gait are normal. Plantar reflexes are down-going bilaterally. DTRs are symmetrical throughout. Sensory, motor and coordinative functions appear intact.    Impression & Recommendations:  Problem # 1:  SKIN LESION (ICD-709.9) refer derm  Orders: Dermatology Referral (Derma)   Problem # 2:  THUMB PAIN (ICD-729.5) persistent - try oxycodone low dose for pain control since she is unable to tolerate the vicodin and other meds not helping  Problem # 3:  DEPRESSIVE DISORDER (ICD-311)  Her updated medication list for this problem includes:    Sertraline Hcl 100 Mg Tabs (Sertraline hcl) .Marland Kitchen... 1 and 1/2 by mouth qd  marked improvement per pt, cont med as is  Problem # 4:  DIABETES MELLITUS, TYPE II (ICD-250.00)  The following medications were removed from the medication list:    Glimepiride 4 Mg Tabs (Glimepiride) .Marland Kitchen... Take one tablet by mouth every morning  Her updated medication list for this problem includes:    Glimepiride 4 Mg Tabs (Glimepiride) .Marland Kitchen... Take 1 tablet by mouth twice a day    Lisinopril-hydrochlorothiazide 20-12.5 Mg Tabs (Lisinopril-hydrochlorothiazide) .Marland Kitchen... Take 2 tablet by mouth once a day    Metformin Hcl 500 Mg Tabs (Metformin hcl) .Marland Kitchen... 1po qd  mild uncontrolled - to add the metformin as above  Problem # 5:  HYPERTENSION (ICD-401.9)  Her updated medication list for this problem includes:    Hydrochlorothiazide 25 Mg Tabs (Hydrochlorothiazide) .Marland Kitchen... Take one (1) tablet by mouth every day    Toprol Xl 50 Mg Tb24 (Metoprolol succinate) .Marland Kitchen... Take  one (1) tablet by mouth every day    Verapamil Hcl Cr 240 Mg Tbcr (Verapamil hcl) .Marland Kitchen... Take one (1) tablet by mouth every day    Carvedilol 6.25 Mg Tabs (Carvedilol) .Marland Kitchen... Take 1 tablet by mouth twice a day    Lisinopril-hydrochlorothiazide 20-12.5 Mg Tabs (Lisinopril-hydrochlorothiazide) .Marland Kitchen... Take 2 tablet by mouth once a day    Verapamil Hcl 120 Mg Tabs (V  on mult meds - to cont as is  Complete Medication List: 1)  Hydrochlorothiazide 25 Mg Tabs (Hydrochlorothiazide) .... Take one (1) tablet by mouth every day 2)  Protonix 40 Mg Tbec (Pantoprazole sodium) .... Take one (1) tablet by mouth every day 3)  Toprol Xl 50 Mg Tb24 (Metoprolol succinate) .... Take one (1) tablet by mouth every day 4)  Verapamil Hcl Cr 240 Mg Tbcr (Verapamil hcl) .... Take one (1) tablet by mouth every day 5)  Carvedilol 6.25 Mg Tabs (Carvedilol) .... Take 1 tablet by mouth twice a day 6)  Diclofenac Sodium 75 Mg Tbec (Diclofenac sodium) .... Take 1 tablet by mouth twice a day 7)  Glimepiride 4 Mg Tabs (Glimepiride) .... Take 1 tablet by mouth twice a day 8)  Levothyroxine Sodium 50 Mcg Tabs (Levothyroxine sodium) .... Take 1 tablet by mouth once a day 9)  Lisinopril-hydrochlorothiazide 20-12.5 Mg Tabs (Lisinopril-hydrochlorothiazide) .... Take 2 tablet by mouth once a day 10)  Pravastatin Sodium 40 Mg Tabs (Pravastatin sodium) .... Take 2 tablet by mouth once a day 11)  Verapamil Hcl 120 Mg Tabs (Verapamil hcl) .... Take 2 tablet by mouth twice a day 12)  Sertraline Hcl 100 Mg Tabs (Sertraline hcl) .Marland Kitchen.. 1 and 1/2 by mouth qd 13)  Vicodin 5-500 Mg Tabs (Hydrocodone-acetaminophen) .Marland Kitchen.. 1po qid prn 14)  Onetouch Test Strp (Glucose blood) .... Use as directed 15)  Onetouch Finepoint Lancets Misc (Lancets) .... Use as directed 16)  Oxycodone Hcl 5 Mg Tabs (Oxycodone hcl) .Marland Kitchen.. 1 by mouth qid prn 17)  Metformin Hcl 500 Mg Tabs (Metformin hcl) .Marland Kitchen.. 1po qd   Patient Instructions: 1)  Take all medications as  prescribed 2)  Please schedule a follow-up appointment in 6 months with cpx labs and HGBA!C prior 250.02 3)  you will be contacted about the referral to the dermatologist    Prescriptions: METFORMIN HCL 500 MG  TABS (METFORMIN HCL) 1po qd  #90 x 3   Entered and Authorized by:   Corwin Levins MD   Signed by:   Corwin Levins MD on 01/27/2007   Method used:   Print then Give to Patient   RxID:   [id number] METFORMIN HCL 500 MG  TABS (METFORMIN HCL) 1po qd  #90 x 3   Entered and Authorized by:   Corwin Levins MD   Signed by:   Corwin Levins MD on 01/27/2007   Method used:   Print then Give to Patient   RxID:   [id number] OXYCODONE HCL 5 MG  TABS (OXYCODONE HCL) 1 by mouth qid prn  #120 x 0   Entered and Authorized by:   Corwin Levins MD   Signed by:   Corwin Levins MD on 01/27/2007   Method used:   Print then Give to Patient   RxID:   [id number]  ]

## 2010-02-21 NOTE — Progress Notes (Signed)
  Phone Note Refill Request Message from:  Fax from Pharmacy on January 22, 2010 4:47 PM  Refills Requested: Medication #1:  PRAVASTATIN SODIUM 40 MG TABS Take 2 tablet by mouth once a day   Dosage confirmed as above?Dosage Confirmed   Last Refilled: 11/12/2009   Notes: [facility] Initial call taken by: Zella Ball Ewing CMA (AAMA),  January 22, 2010 4:47 PM    Prescriptions: PRAVASTATIN SODIUM 40 MG TABS (PRAVASTATIN SODIUM) Take 2 tablet by mouth once a day  #60 x 0   Entered by:   Scharlene Gloss CMA (AAMA)   Authorized by:   Corwin Levins MD   Signed by:   Scharlene Gloss CMA (AAMA) on 01/22/2010   Method used:   Faxed to ...       [facility] (retail)       [address]       [place], [place]  [address]       Ph: [phone]       Fax: [phone]   RxID:   [id number]

## 2010-03-08 ENCOUNTER — Emergency Department (HOSPITAL_COMMUNITY)
Admission: EM | Admit: 2010-03-08 | Discharge: 2010-03-08 | Disposition: A | Discharge status: Home / Self Care | Payer: Self-pay | Attending: Emergency Medicine | Admitting: Emergency Medicine

## 2010-03-08 DIAGNOSIS — Z79899 Other long term (current) drug therapy: Secondary | ICD-10-CM | POA: Insufficient documentation

## 2010-03-08 DIAGNOSIS — F3289 Other specified depressive episodes: Secondary | ICD-10-CM | POA: Insufficient documentation

## 2010-03-08 DIAGNOSIS — Z950 Presence of cardiac pacemaker: Secondary | ICD-10-CM | POA: Insufficient documentation

## 2010-03-08 DIAGNOSIS — H9209 Otalgia, unspecified ear: Secondary | ICD-10-CM | POA: Insufficient documentation

## 2010-03-08 DIAGNOSIS — F329 Major depressive disorder, single episode, unspecified: Secondary | ICD-10-CM | POA: Insufficient documentation

## 2010-03-08 DIAGNOSIS — I10 Essential (primary) hypertension: Secondary | ICD-10-CM | POA: Insufficient documentation

## 2010-03-08 DIAGNOSIS — E039 Hypothyroidism, unspecified: Secondary | ICD-10-CM | POA: Insufficient documentation

## 2010-03-08 DIAGNOSIS — E119 Type 2 diabetes mellitus without complications: Secondary | ICD-10-CM | POA: Insufficient documentation

## 2010-03-08 DIAGNOSIS — H6590 Unspecified nonsuppurative otitis media, unspecified ear: Secondary | ICD-10-CM | POA: Insufficient documentation

## 2010-04-01 LAB — CBC
HCT: 37.2 % (ref 36.0–46.0)
Hemoglobin: 12.6 g/dL (ref 12.0–15.0)
MCH: 31.4 pg (ref 26.0–34.0)
MCHC: 33.9 g/dL (ref 30.0–36.0)
MCV: 92.8 fL (ref 78.0–100.0)
Platelets: 252 10*3/uL (ref 150–400)
RBC: 4.01 MIL/uL (ref 3.87–5.11)
RDW: 15 % (ref 11.5–15.5)
WBC: 10.4 10*3/uL (ref 4.0–10.5)

## 2010-04-01 LAB — COMPREHENSIVE METABOLIC PANEL
ALT: 10 U/L (ref 0–35)
AST: 19 U/L (ref 0–37)
Albumin: 3.7 g/dL (ref 3.5–5.2)
Alkaline Phosphatase: 65 U/L (ref 39–117)
BUN: 6 mg/dL (ref 6–23)
CO2: 31 mEq/L (ref 19–32)
Calcium: 9.8 mg/dL (ref 8.4–10.5)
Chloride: 103 mEq/L (ref 96–112)
Creatinine, Ser: 0.83 mg/dL (ref 0.4–1.2)
GFR calc Af Amer: >60 mL/min (ref >60)
GFR calc non Af Amer: >60 mL/min (ref >60)
Glucose, Bld: 124 mg/dL — ABNORMAL HIGH (ref 70–99)
Potassium: 3.2 mEq/L — ABNORMAL LOW (ref 3.5–5.1)
Sodium: 142 mEq/L (ref 135–145)
Total Bilirubin: 0.1 mg/dL — ABNORMAL LOW (ref 0.3–1.2)
Total Protein: 6.8 g/dL (ref 6.0–8.3)

## 2010-04-01 LAB — GLUCOSE, CAPILLARY: Glucose-Capillary: 110 mg/dL — ABNORMAL HIGH (ref 70–99)

## 2010-04-01 LAB — DIFFERENTIAL
Basophils Absolute: 0.1 10*3/uL (ref 0.0–0.1)
Basophils Relative: 1 % (ref 0–1)
Eosinophils Absolute: 0.4 10*3/uL (ref 0.0–0.7)
Eosinophils Relative: 4 % (ref 0–5)
Lymphocytes Relative: 17 % (ref 12–46)
Lymphs Abs: 1.8 10*3/uL (ref 0.7–4.0)
Monocytes Absolute: 0.8 10*3/uL (ref 0.1–1.0)
Monocytes Relative: 7 % (ref 3–12)
Neutro Abs: 7.3 10*3/uL (ref 1.7–7.7)
Neutrophils Relative %: 71 % (ref 43–77)

## 2010-04-03 ENCOUNTER — Emergency Department (HOSPITAL_COMMUNITY)
Admission: EM | Admit: 2010-04-03 | Discharge: 2010-04-04 | Disposition: A | Discharge status: Home / Self Care | Payer: Self-pay | Attending: Emergency Medicine | Admitting: Emergency Medicine

## 2010-04-03 DIAGNOSIS — E039 Hypothyroidism, unspecified: Secondary | ICD-10-CM | POA: Insufficient documentation

## 2010-04-03 DIAGNOSIS — F3289 Other specified depressive episodes: Secondary | ICD-10-CM | POA: Insufficient documentation

## 2010-04-03 DIAGNOSIS — E119 Type 2 diabetes mellitus without complications: Secondary | ICD-10-CM | POA: Insufficient documentation

## 2010-04-03 DIAGNOSIS — Z950 Presence of cardiac pacemaker: Secondary | ICD-10-CM | POA: Insufficient documentation

## 2010-04-03 DIAGNOSIS — I10 Essential (primary) hypertension: Secondary | ICD-10-CM | POA: Insufficient documentation

## 2010-04-03 DIAGNOSIS — F329 Major depressive disorder, single episode, unspecified: Secondary | ICD-10-CM | POA: Insufficient documentation

## 2010-04-03 DIAGNOSIS — R079 Chest pain, unspecified: Secondary | ICD-10-CM | POA: Insufficient documentation

## 2010-04-04 ENCOUNTER — Emergency Department (HOSPITAL_COMMUNITY)
Admit: 2010-04-04 | Discharge: 2010-04-04 | Disposition: A | Discharge status: Home / Self Care | Payer: Self-pay | Attending: Emergency Medicine | Admitting: Emergency Medicine

## 2010-04-04 LAB — COMPREHENSIVE METABOLIC PANEL
ALT: 12 U/L (ref 0–35)
AST: 24 U/L (ref 0–37)
Albumin: 4.1 g/dL (ref 3.5–5.2)
Alkaline Phosphatase: 65 U/L (ref 39–117)
BUN: 20 mg/dL (ref 6–23)
CO2: 31 mEq/L (ref 19–32)
Calcium: 9.7 mg/dL (ref 8.4–10.5)
Chloride: 101 mEq/L (ref 96–112)
Creatinine, Ser: 1.1 mg/dL (ref 0.4–1.2)
GFR calc Af Amer: >60 mL/min (ref >60)
GFR calc non Af Amer: 51 mL/min — ABNORMAL LOW (ref >60)
Glucose, Bld: 117 mg/dL — ABNORMAL HIGH (ref 70–99)
Potassium: 3.6 mEq/L (ref 3.5–5.1)
Sodium: 140 mEq/L (ref 135–145)
Total Bilirubin: 0.4 mg/dL (ref 0.3–1.2)
Total Protein: 7 g/dL (ref 6.0–8.3)

## 2010-04-04 LAB — CBC
HCT: 35 % — ABNORMAL LOW (ref 36.0–46.0)
Hemoglobin: 11.7 g/dL — ABNORMAL LOW (ref 12.0–15.0)
MCH: 30.8 pg (ref 26.0–34.0)
MCHC: 33.4 g/dL (ref 30.0–36.0)
MCV: 92.1 fL (ref 78.0–100.0)
Platelets: 251 10*3/uL (ref 150–400)
RBC: 3.8 MIL/uL — ABNORMAL LOW (ref 3.87–5.11)
RDW: 15.2 % (ref 11.5–15.5)
WBC: 7.9 10*3/uL (ref 4.0–10.5)

## 2010-04-04 LAB — D-DIMER, QUANTITATIVE: D-Dimer, Quant: 0.41 ug/mL-FEU (ref 0.00–0.48)

## 2010-04-04 LAB — POCT CARDIAC MARKERS
CKMB, poc: 1.2 ng/mL (ref 1.0–8.0)
CKMB, poc: 1.1 ng/mL (ref 1.0–8.0)
Myoglobin, poc: 95 ng/mL (ref 12–200)
Myoglobin, poc: 46 ng/mL (ref 12–200)
Troponin i, poc: <0.05 ng/mL (ref 0.00–0.09)
Troponin i, poc: <0.05 ng/mL (ref 0.00–0.09)

## 2010-04-04 LAB — APTT: aPTT: 33 seconds (ref 24–37)

## 2010-04-04 LAB — PROTIME-INR
INR: 0.92 (ref 0.00–1.49)
Prothrombin Time: 12.6 seconds (ref 11.6–15.2)

## 2010-04-23 LAB — BASIC METABOLIC PANEL
BUN: 14 mg/dL (ref 6–23)
CO2: 31 mEq/L (ref 19–32)
Calcium: 9.3 mg/dL (ref 8.4–10.5)
Chloride: 100 mEq/L (ref 96–112)
Creatinine, Ser: 0.84 mg/dL (ref 0.4–1.2)
GFR calc Af Amer: >60 mL/min (ref >60)
GFR calc non Af Amer: >60 mL/min (ref >60)
Glucose, Bld: 149 mg/dL — ABNORMAL HIGH (ref 70–99)
Potassium: 3.7 mEq/L (ref 3.5–5.1)
Sodium: 140 mEq/L (ref 135–145)

## 2010-04-23 LAB — CK TOTAL AND CKMB (NOT AT ARMC)
CK, MB: 3.3 ng/mL (ref 0.3–4.0)
Relative Index: 2.2 (ref 0.0–2.5)
Total CK: 151 U/L (ref 7–177)

## 2010-04-23 LAB — URINALYSIS, ROUTINE W REFLEX MICROSCOPIC
Bilirubin Urine: NEGATIVE
Glucose, UA: NEGATIVE mg/dL
Hgb urine dipstick: NEGATIVE
Ketones, ur: NEGATIVE mg/dL
Nitrite: NEGATIVE
Protein, ur: NEGATIVE mg/dL
Specific Gravity, Urine: 1.005 (ref 1.005–1.030)
Urobilinogen, UA: 0.2 mg/dL (ref 0.0–1.0)
pH: 7.5 (ref 5.0–8.0)

## 2010-04-23 LAB — DIFFERENTIAL
Basophils Absolute: 0.1 10*3/uL (ref 0.0–0.1)
Basophils Relative: 1 % (ref 0–1)
Eosinophils Absolute: 0.3 10*3/uL (ref 0.0–0.7)
Eosinophils Relative: 3 % (ref 0–5)
Lymphocytes Relative: 32 % (ref 12–46)
Lymphs Abs: 3.1 10*3/uL (ref 0.7–4.0)
Monocytes Absolute: 0.7 10*3/uL (ref 0.1–1.0)
Monocytes Relative: 7 % (ref 3–12)
Neutro Abs: 5.6 10*3/uL (ref 1.7–7.7)
Neutrophils Relative %: 58 % (ref 43–77)

## 2010-04-23 LAB — CBC
HCT: 39.1 % (ref 36.0–46.0)
Hemoglobin: 13 g/dL (ref 12.0–15.0)
MCHC: 33.3 g/dL (ref 30.0–36.0)
MCV: 95.3 fL (ref 78.0–100.0)
Platelets: 235 10*3/uL (ref 150–400)
RBC: 4.1 MIL/uL (ref 3.87–5.11)
RDW: 16 % — ABNORMAL HIGH (ref 11.5–15.5)
WBC: 9.8 10*3/uL (ref 4.0–10.5)

## 2010-04-23 LAB — TROPONIN I: Troponin I: 0.02 ng/mL (ref 0.00–0.06)

## 2010-04-24 LAB — CBC
HCT: 42 % (ref 36.0–46.0)
Hemoglobin: 14 g/dL (ref 12.0–15.0)
MCHC: 33.2 g/dL (ref 30.0–36.0)
MCV: 95.3 fL (ref 78.0–100.0)
Platelets: 290 10*3/uL (ref 150–400)
RBC: 4.41 MIL/uL (ref 3.87–5.11)
RDW: 16.2 % — ABNORMAL HIGH (ref 11.5–15.5)
WBC: 14.3 10*3/uL — ABNORMAL HIGH (ref 4.0–10.5)

## 2010-04-24 LAB — COMPREHENSIVE METABOLIC PANEL
ALT: 16 U/L (ref 0–35)
AST: 23 U/L (ref 0–37)
Albumin: 4 g/dL (ref 3.5–5.2)
Alkaline Phosphatase: 75 U/L (ref 39–117)
BUN: 9 mg/dL (ref 6–23)
CO2: 32 mEq/L (ref 19–32)
Calcium: 9.3 mg/dL (ref 8.4–10.5)
Chloride: 100 mEq/L (ref 96–112)
Creatinine, Ser: 0.75 mg/dL (ref 0.4–1.2)
GFR calc Af Amer: >60 mL/min (ref >60)
GFR calc non Af Amer: >60 mL/min (ref >60)
Glucose, Bld: 130 mg/dL — ABNORMAL HIGH (ref 70–99)
Potassium: 3.3 mEq/L — ABNORMAL LOW (ref 3.5–5.1)
Sodium: 139 mEq/L (ref 135–145)
Total Bilirubin: 0.4 mg/dL (ref 0.3–1.2)
Total Protein: 7.4 g/dL (ref 6.0–8.3)

## 2010-04-24 LAB — DIFFERENTIAL
Basophils Absolute: 0 10*3/uL (ref 0.0–0.1)
Basophils Relative: 0 % (ref 0–1)
Eosinophils Absolute: 0.1 10*3/uL (ref 0.0–0.7)
Eosinophils Relative: 1 % (ref 0–5)
Lymphocytes Relative: 13 % (ref 12–46)
Lymphs Abs: 1.9 10*3/uL (ref 0.7–4.0)
Monocytes Absolute: 0.3 10*3/uL (ref 0.1–1.0)
Monocytes Relative: 2 % — ABNORMAL LOW (ref 3–12)
Neutro Abs: 11.9 10*3/uL — ABNORMAL HIGH (ref 1.7–7.7)
Neutrophils Relative %: 84 % — ABNORMAL HIGH (ref 43–77)

## 2010-04-24 LAB — URINALYSIS, ROUTINE W REFLEX MICROSCOPIC
Bilirubin Urine: NEGATIVE
Glucose, UA: NEGATIVE mg/dL
Hgb urine dipstick: NEGATIVE
Ketones, ur: NEGATIVE mg/dL
Nitrite: NEGATIVE
Protein, ur: NEGATIVE mg/dL
Specific Gravity, Urine: 1.011 (ref 1.005–1.030)
Urobilinogen, UA: 0.2 mg/dL (ref 0.0–1.0)
pH: 8 (ref 5.0–8.0)

## 2010-04-30 LAB — POCT I-STAT, CHEM 8
BUN: 19 mg/dL (ref 6–23)
Calcium, Ion: 1.11 mmol/L — ABNORMAL LOW (ref 1.12–1.32)
Chloride: 101 mEq/L (ref 96–112)
Creatinine, Ser: 1 mg/dL (ref 0.4–1.2)
Glucose, Bld: 73 mg/dL (ref 70–99)
HCT: 44 % (ref 36.0–46.0)
Hemoglobin: 15 g/dL (ref 12.0–15.0)
Potassium: 3.4 mEq/L — ABNORMAL LOW (ref 3.5–5.1)
Sodium: 140 mEq/L (ref 135–145)
TCO2: 31 mmol/L (ref 0–100)

## 2010-04-30 LAB — URIC ACID: Uric Acid, Serum: 5.4 mg/dL (ref 2.4–7.0)

## 2010-05-06 LAB — DIFFERENTIAL
Basophils Absolute: 0 10*3/uL (ref 0.0–0.1)
Basophils Relative: 1 % (ref 0–1)
Eosinophils Absolute: 0.2 10*3/uL (ref 0.0–0.7)
Eosinophils Relative: 3 % (ref 0–5)
Lymphocytes Relative: 42 % (ref 12–46)
Lymphs Abs: 3.7 10*3/uL (ref 0.7–4.0)
Monocytes Absolute: 0.6 10*3/uL (ref 0.1–1.0)
Monocytes Relative: 7 % (ref 3–12)
Neutro Abs: 4.2 10*3/uL (ref 1.7–7.7)
Neutrophils Relative %: 48 % (ref 43–77)

## 2010-05-06 LAB — COMPREHENSIVE METABOLIC PANEL
ALT: 13 U/L (ref 0–35)
AST: 26 U/L (ref 0–37)
Albumin: 3.7 g/dL (ref 3.5–5.2)
Alkaline Phosphatase: 66 U/L (ref 39–117)
BUN: 14 mg/dL (ref 6–23)
CO2: 31 mEq/L (ref 19–32)
Calcium: 9 mg/dL (ref 8.4–10.5)
Chloride: 106 mEq/L (ref 96–112)
Creatinine, Ser: 0.76 mg/dL (ref 0.4–1.2)
GFR calc Af Amer: >60 mL/min (ref >60)
GFR calc non Af Amer: >60 mL/min (ref >60)
Glucose, Bld: 89 mg/dL (ref 70–99)
Potassium: 3.8 mEq/L (ref 3.5–5.1)
Sodium: 141 mEq/L (ref 135–145)
Total Bilirubin: 0.8 mg/dL (ref 0.3–1.2)
Total Protein: 6.8 g/dL (ref 6.0–8.3)

## 2010-05-06 LAB — CBC
HCT: 41.3 % (ref 36.0–46.0)
Hemoglobin: 13.7 g/dL (ref 12.0–15.0)
MCHC: 33.1 g/dL (ref 30.0–36.0)
MCV: 93.2 fL (ref 78.0–100.0)
Platelets: 243 10*3/uL (ref 150–400)
RBC: 4.43 MIL/uL (ref 3.87–5.11)
RDW: 16.2 % — ABNORMAL HIGH (ref 11.5–15.5)
WBC: 8.8 10*3/uL (ref 4.0–10.5)

## 2010-05-06 LAB — POCT CARDIAC MARKERS
CKMB, poc: 1.2 ng/mL (ref 1.0–8.0)
CKMB, poc: 1.4 ng/mL (ref 1.0–8.0)
Myoglobin, poc: 56.7 ng/mL (ref 12–200)
Myoglobin, poc: 65.7 ng/mL (ref 12–200)
Troponin i, poc: <0.05 ng/mL (ref 0.00–0.09)
Troponin i, poc: <0.05 ng/mL (ref 0.00–0.09)

## 2010-05-06 LAB — LIPASE, BLOOD: Lipase: 32 U/L (ref 11–59)

## 2010-05-13 ENCOUNTER — Encounter: Payer: Self-pay | Admitting: Internal Medicine

## 2010-06-04 NOTE — Discharge Summary (Signed)
NAMEDIAVIAN, FURGASON                 ACCOUNT NO.:  [id number]   MEDICAL RECORD NO.:  [id number]          PATIENT TYPE:  OIB   LOCATION:  2899                         FACILITY:  [facility]   PHYSICIAN:  Maple Mirza, PA   DATE OF BIRTH:  [birth year]   DATE OF ADMISSION:  05/25/2007  DATE OF DISCHARGE:                               DISCHARGE SUMMARY   FINAL DIAGNOSES:  1. Pacemaker implanted for symptomatic bradycardia, is at elective      replacement indicator.  2. Explantation of existing pacemaker with implant of a Medtronic      Adapta L dual-chamber pacemaker, Dr. Lewayne Bunting.  3. Fatigue and a feeling of wiped out, possibly secondary to      verapamil.   SECONDARY DIAGNOSES:  1. Symptomatic bradycardia, pacemaker implant 1997.  2. Ongoing tobacco habituation.  3. Gout.  4. Gastroesophageal reflux disease.  5. Hypertension.  6. Hypothyroidism.  7. Degenerative joint disease.   PROCEDURE:  May 25, 2007, explant of existing Medtronic Thera DR  pacemaker with implant of the Medtronic Adapta L dual-chamber pacemaker,  Dr. Lewayne Bunting.   BRIEF HISTORY:  Ms. [name] is a 60 year old female.  She had a pacemaker  implanted in 1997.  It is now at elective replacement indicator.  The  patient also complains of being tired and wiped out even when taking a  shower.  Her medications checked today shows that she is on verapamil  240 mg b.i.d.  When she saw Dr. Ladona Ridgel in the office in March 2008, she  was on 120 mg b.i.d.  This had been subsequently increased due to  persistently increased blood pressures.   There is no doubt that the patient has hypertension.  Her blood pressure  today on presentation to the short-stay center for pacemaker change out,  was 194 systolic over 119 diastolic.  The patient claims that she has  taken her medications as instructed except for her diabetic medication.  It becomes evident that the patient will need much more careful  attention to blood pressure  control in the future.   MEDICATIONS AT DISCHARGE.:  1. Lisinopril/hydrochlorothiazide 20/12.5 twice daily.  2. Glimepiride 4 mg daily.  3. Verapamil 120 mg twice daily.  This dose was adjusted downward from      240 mg twice daily due to an indication that a maximum dose of      verapamil would be about 400 mg daily.  4. Enteric-coated aspirin 81 mg daily.  5. Carvedilol 6.25 mg twice daily.  6. Pravastatin 40 mg daily at bedtime.  7. Metformin 500 mg daily.  8. Sertraline 100 mg tablets 1/2 tablet daily.  9. Levothyroxine 50 mcg daily.   NEW MEDICATIONS AT DISCHARGE:  Clonidine 0.1 mg twice daily, to be  adjusted upwards in the future and office visits with Dr. Ladona Ridgel, if  blood pressure is persistently high, and she will go home with a  prescription for clindamycin 300 mg tablets, 1 tablet every 8 hours for  the next 5 days as an antibiotic.  She will also have a prescription for  Percocet  5/325 1-2 tablets every 4-6 hours as needed for pain.  She has  follow up with Dr. Ladona Ridgel Tuesday Jun 01, 2007 at 10:15 in the morning  to check blood pressure and adjust blood pressure medications and she  has a followup at [facility] on [address] the  [facility] Thursday Jun 10, 2007, at 9 o'clock.  Of note, the patient  did have some abdominal discomfort which after the procedure, this  improved to a great extent after the patient was given a GI cocktail of  simethicone and Protonix.   LABORATORY STUDIES:  Pertinent to this admission, white cells 8.2,  hemoglobin 12.8, hematocrit 39.8, platelets 251, pro time 12.2, INR 1.0,  sodium 141, potassium 3.6, chloride 103, carbonate 33, glucose 108, BUN  is 11, and creatinine is 0.8.      Maple Mirza, PA     GM/MEDQ  D:  05/25/2007  T:  05/26/2007  Job:  [id number]   cc:   Doylene Canning. Ladona Ridgel, MD  Thereasa Solo Little, M.D.  Corwin Levins, MD

## 2010-06-04 NOTE — Assessment & Plan Note (Signed)
[facility]                         ELECTROPHYSIOLOGY OFFICE NOTE   NAME:HORNEJenisis, Harmsen                        MRN:          [id number]  DATE:09/14/2007                            DOB:          Mar 27, 1950    Ms. Mathurin returns today for followup.  She is a very pleasant middle-  aged woman with a history of symptomatic bradycardia and  complete heart  block status post pacemaker insertion.  She returns today for followup.  She denies chest pain or shortness of breath, but she does have pain  over her pacemaker insertion site, which has been present since implant.  She has a small keloid formed as well.   PHYSICAL EXAMINATION:  GENERAL:  She is a pleasant middle-aged woman in  no acute distress.  VITAL SIGNS:  Blood pressure today was 150/100, pulse was 83 and  regular, and the respirations were 18.  Weight was 234 pounds.  NECK:  No jugular venous distention.  LUNGS:  Clear bilaterally to auscultation.  No wheezes, rales, or  rhonchi are present.  There is no increased work of breathing.  CARDIOVASCULAR:  Regular rate.  Normal S1 and S2.  Pacemaker insertion  site was healing nicely.  It was tender to palpation just above the  incision site, and the incision itself was tender as well.  There is a  small keloid present.  EXTREMITIES:  No edema.   Interrogation of her pacemaker demonstrates a Medtronic Adapta with P-  waves greater than 2 and R waves greater than 16.  The impedance was 631  in the A and 689 in the V, threshold a volt of 0.4 in the atrium, less  than a volt of 0.4 in the right ventricle.   IMPRESSION:  1. Symptomatic bradycardia.  2. Status post pacemaker insertion.  3. Pacemaker insertion site pain.   DISCUSSION:  Overall, Ms. Hable is stable.  I have given her some pain  medicine today with codeine. We will plan on seeing her back in the  office within 3 months.     Doylene Canning. Ladona Ridgel, MD  Electronically Signed    GWT/MedQ   DD: 09/14/2007  DT: 09/15/2007  Job #: [id number]

## 2010-06-04 NOTE — Op Note (Signed)
[name], [name]                 ACCOUNT NO.:  [id number]   MEDICAL RECORD NO.:  [id number]          PATIENT TYPE:  OIB   LOCATION:  2899                         FACILITY:  [facility]   PHYSICIAN:  Doylene Canning. Ladona Ridgel, MD    DATE OF BIRTH:  12/07/1950   DATE OF PROCEDURE:  05/25/2007  DATE OF DISCHARGE:                               OPERATIVE REPORT   PROCEDURE PERFORMED:  Removal of previous implanted pacemaker, which is  a ERI, insertion of a new dual-chamber pacemaker with pacemaker pocket  revision.   INTRODUCTION:  The patient is a 60 year old woman with a long history of  bradycardia who is status post pacemaker insertion, initially in 1997.  She did experience multiple lead dislodgements and had redo procedures  back in the 1990s.  However, she has been stable since then and was  subsequently found to be a device ERI and is referred now for pacemaker  removal and insertion of a new device.   OPERATIVE PROCEDURE:  After informed consent was obtained, the patient  was taken to the diagnostic EP lab in a fasting state.  After usual  preparation and draping, intravenous fentanyl and midazolam was given  for sedation.  Lidocaine 30 mL was infiltrated over the right  infraclavicular region.  A 5-cm incision was carried out over this  region.  Electrocautery was utilized to dissect down to the fascial  plane.  Electrocautery was then utilized to enter the subcutaneous  pocket.  There was copious scar tissue present.  Extensive  electrocautery was required to free up the pocket of the generator, so  the device could be removed.  Having accomplished this, the leads were  evaluated.  The P waves were 3, the R waves 20, the impedance 499 in the  A and 522 in the B, the threshold 0.8 at 0.5 in the atrium and 0.6 at  0.5 in the ventricle.  With these satisfactory parameters, the new  Medtronic adapter, dual-chamber pacemaker serial number [id number] was  connected to the atrial and ventricular  leads and placed back in the  subcutaneous pocket.  Generator secured with silk.  The pocket was  irrigated with kanamycin, the incision was closed with a layer of 2-0  Vicryl followed by layer of 3-0 Vicryl.  Benzoin was painted on the  skin, Steri-Strips were applied, and pressure dressing was placed.  The  patient was returned to her room in satisfactory condition.   COMPLICATIONS:  There were  no immediate procedure complications.   RESULTS:  This demonstrate successful removal of a Medtronic dual-  chamber pacemaker and insertion of a new device with pacemaker pocket  revision carried out to accommodate the different shaped new pacemaker.      Doylene Canning. Ladona Ridgel, MD  Electronically Signed     GWT/MEDQ  D:  05/25/2007  T:  05/26/2007  Job:  [id number]   cc:   Corwin Levins, MD

## 2010-06-04 NOTE — Assessment & Plan Note (Signed)
[facility]                         ELECTROPHYSIOLOGY OFFICE NOTE   NAME:HORNEDanijela, Vessey                        MRN:          [id number]  DATE:05/19/2007                            DOB:          Jul 23, 1950    Ms. Schechter returns today after a year absence from the EP clinic.  She is  very pleasant middle-age woman with a history of symptomatic bradycardia  status post pacemaker insertion in 1997.  She returns today for  followup.  Her pacemaker has reached elective replacement indication.  She had no specific complaints today.   MEDICATIONS:  1. Synthroid 50 mcg daily.  2. Glyburide 4 mg daily.  3. Lisinopril/hydrochlorothiazide 20/12.5 mg 2 tablets daily.  4. Verapamil 120 mg twice daily.  5. Aspirin 81 a day.  6. Carvedilol 6.25 twice daily.  7. Metformin 500 daily.   PHYSICAL EXAMINATION:  GENERAL:  She is a pleasant woman in no acute  distress.  VITAL SIGNS:  Blood pressure was 156/82, the pulse 63 and regular,  respirations 18.  The weight was 246 pounds.  NECK:  Revealed no jugular venous distention.  LUNGS:  Clear bilaterally to auscultation.  No wheezes, rales or rhonchi  are present.  CARDIOVASCULAR:  Regular rate and rhythm.  Normal S1-S2.  EXTREMITIES:  Demonstrate no edema.   Interrogation of her pacemaker was carried out today.  This demonstrates  a Medtronic dual-chamber device.  It has reached ERI.   IMPRESSION:  1. Symptomatic bradycardia.  2. Status post pacemaker insertion.   DISCUSSION:  I discussed treatment options with the patient.  There  risks, benefits, goals and expectations of pacemaker generator change  have been discussed, and she wished to proceed. This will be scheduled  in the next several days.     Doylene Canning. Ladona Ridgel, MD  Electronically Signed    GWT/MedQ  DD: 05/19/2007  DT: 05/19/2007  Job #: [id number]   cc:   Corwin Levins, MD

## 2010-06-04 NOTE — Assessment & Plan Note (Signed)
[facility]                         ELECTROPHYSIOLOGY OFFICE NOTE   NAME:HORNEKandace, Elrod                        MRN:          [id number]  DATE:06/01/2007                            DOB:          07/08/1950    Ms. Capell returns today for follow-up.  She is a very pleasant middle-  aged woman with a history of symptomatic bradycardia who is at pacemaker  ERI and underwent pacemaker generator change several months ago.  She  returns today for follow-up.  She had no specific complaints today  except that on verapamil she felt very poorly.  She states that whenever  she takes her verapamil she feels weak and tired and has no energy.   MEDICATIONS:  1. Synthroid 50 mcg daily.  2. Glyburide 4 mg daily.  3. Lisinopril/HCTZ 20/12.5 two tablets daily.  4. Verapamil 120 twice daily.  5. Aspirin 81 mg daily.  6. Carvedilol 6.25 twice daily.  7. Pravachol 40 daily.  8. Metformin 500 daily.  9. Clonidine 0.2 mg twice daily.   PHYSICAL EXAMINATION:  She is a pleasant, obese, middle-aged woman in no  distress.  Blood pressure was 188/115, pulse was 58 and regular,  respirations were 18.  The weight was 245 pounds.  NECK:  Revealed no jugular venous distention.  LUNGS:  Clear bilaterally to auscultation.  No wheezes, rales or rhonchi  are present.  CARDIOVASCULAR:  Regular rate and rhythm.  Normal S1 and S2.  There is a  soft S4 gallop present.  ABDOMEN:  Obese, nontender, nondistended, there is no organomegaly.  EXTREMITIES:  Demonstrate no cyanosis, clubbing or edema.  The pacemaker  incision was healed nicely.   Interrogation of her pacemaker demonstrates a Medtronic adaptive. P-  waves were 4, the R-waves 22, the impedance 600 in A, 678 in the V,  threshold 1.4 in the A, 0.75 at .4 in the V.  The battery voltage was  2.8 volts.  She is A pacing 29% of the time.  Her Steri-Strips were  removed today.   IMPRESSION:  .  1. Symptomatic bradycardia.  2.  Status post pacemaker.  3. Hypertension uncontrolled.  4. Obesity.   DISCUSSION:  I have recommend that we stop her verapamil altogether and  increase her carvedilol from 6 to 12 twice daily. She will continue her  other hypertensive medications. Our plan will be to up titrate her  carvedilol over the next several months.     Doylene Canning. Ladona Ridgel, MD  Electronically Signed    GWT/MedQ  DD: 06/01/2007  DT: 06/01/2007  Job #: [id number]

## 2010-06-04 NOTE — Assessment & Plan Note (Signed)
[facility]                         ELECTROPHYSIOLOGY OFFICE NOTE   NAME:[name]                        MRN:          [id number]  DATE:02/16/2008                            DOB:          [birth year]    [name] returns today followup.  She is a very pleasant middle-aged  woman with a history of symptomatic bradycardia and high-grade heart  block who is status post pacemaker insertion.  She returns today for  followup.  After her pacemaker was placed, she has severe pain at her  insertion site and this is now resolved.  She notes that on her left  side, she had a knot that she noticed over the last few weeks and is  presently undergoing evaluation by Dr. Melvyn Novas try to figure out what this  might be.  Otherwise, she had no complaints today except for some mild  left arm and shoulder discomfort.  She denies fevers or chills,  shortness of breath, or chest pain.   MEDICATIONS:  1. Fish oil daily.  2. Sertraline.  3. She is also on metformin 500 a day.  4. Carvedilol 6.25 twice a day.  5. Aspirin 81 mg a day.  6. Lisinopril/hydrochlorothiazide 20/12.5 two tablets daily.  7. Glimepiride 4 mg daily.  8. Synthroid 50 mcg daily.   PHYSICAL EXAMINATION:  GENERAL:  She is a pleasant obese middle-aged  woman in no distress.  VITAL SIGNS:  Blood pressure was 140/100, the pulse was 70 and regular,  the respirations were 18, the weight was 239 pounds.  NECK:  No jugular venous distention.  LUNGS:  Clear bilaterally to auscultation.  No wheezes, rales, or  rhonchi are present.  There is no increased work of breathing.  CARDIAC:  Regular rate and rhythm.  Normal S1 and S2.  ABDOMEN:  Soft, nontender.  EXTREMITIES:  No edema.   Interrogation of her pacemaker demonstrates Medtronic Adapta.  P-waves  were 2, the R-wave 16, the impedance 642 in the A, 645 in the RV,  threshold was volt of 0.4 in the A and the RV.  The battery voltage was  2.8 volts.  There  are no mode switching episodes.   IMPRESSION:  1. Symptomatic bradycardia.  2. Status post pacemaker insertion.  3. Obesity.  4. Hypertension.   DISCUSSION:  Overall, [name] is stable.  We will plan to see the  patient back for pacemaker followup in 6 months.  I have encouraged her  to maintain a low-sodium diet and continue to try to lose some weight.     Doylene Canning. Ladona Ridgel, MD  Electronically Signed    GWT/MedQ  DD: 02/16/2008  DT: 02/17/2008  Job #: [id number]

## 2010-06-07 NOTE — Discharge Summary (Signed)
[place]. [facility]  Patient:    [name], [name]                       MRN: [id number] Adm. Date:  03/18/00 Attending:  Gerlene Burdock A. [name], M.D. Dictator:   [name], R.N., N.P. CC:         [name], M.D.   Discharge Summary  HISTORY OF PRESENT ILLNESS: Ms. [name] is a 60 year old married African-American female, a patient of Dr. Susa [name], with prior medical history of AV nodal conduction abnormality, with syncope, in 1997 and with subsequent PTVDP placed by Dr. Susa [name].  She has cardiac risk factors of premature family history, hypertension, and tobacco use.  She apparently was at her home sewing and she progressively became more short of breath and then had substernal chest tightness and pressure.  She had no nausea or vomiting, no diaphoresis, and no palpitations, presyncope, or syncope.  It lasted approximately three hours.  She got scared and she went to the emergency room.  She was given oxygen and two sublingual nitroglycerin with decrease in her chest pain.  Her EKG was abnormal but non-acute.  The first set of enzymes was negative.  She was placed on IV nitroglycerin and was seen by Dr. Lenise [name] in the emergency room, who recommended she undergo cardiac catheterization, and she was willing to proceed.  HOSPITAL COURSE: On March 18, 2000 she underwent cardiac catheterization by Dr. Lenise [name], and she had no significant coronary artery disease.  She had 60% left ventricular function.  She did have LVH.  She had a 20% small diagonal lesion, and no other lesions.  She had Perclose in her right femoral. She was given 1 g of vancomycin.  She did have severe discomfort in her right groin area and the bandage was taken off on March 19, 2000 and she had no hematoma and no bruise.  There was no warmth or oozing to the area.  She felt better after the bandage was removed.  She was seen by Dr.  Susa [name] and considered stable to be discharged home.  DISCHARGE MEDICATIONS:  1. Toprol XL 50 mg q.d.  2. Altace 10 mg q.d.  3. Verapamil 240 mg q.d.  4. Protonix 40 mg q.d.  5. She was given a prescription for Darvocet (#30 pills with no refills) for     her groin discomfort.  DISCHARGE ACTIVITY: She is to do no strenuous activity, no lifting, and no driving for two days.  FOLLOW-UP: She will follow up with Dr. Alanda [name] for pacemaker check on April 14, 2000 at 4 p.m.  LABORATORY DATA: Total cholesterol was 229, triglyceride 167, HDL 54, LDL 142. TSH was slightly elevated at 6.5.  Hemoglobin 13.2, hematocrit 39.8; platelets 298,000.  The first CK obtained was 249, MB 2.3.  Troponin was less than 0.01.  DISCHARGE DIAGNOSES:  1. Chest pain, non-ischemic, with subsequent catheterization revealing normal     coronary arteries.  2. Left ventricular hypertrophy.  3. Ejection fraction of 60%.  4. Hypertension.  5. PTVDP in 1997, [employer], with apparently some lead dislodgement and     revision in 199 by Dr. Laneta [name].  6. History of goiter, on Synthroid remotely, now with slightly elevated TSH.     We will request that she follow up with her primary care physician.  7. Tobacco use.  8. Hypertension, with medications increased while in the hospital. DD:  03/19/00 TD:  03/19/00 Job: [id number]

## 2010-06-07 NOTE — Consult Note (Signed)
[name], [name]                           ACCOUNT NO.:  [id number]   MEDICAL RECORD NO.:  [id number]                   PATIENT TYPE:  INP   LOCATION:  0349                                 FACILITY:  [facility]   PHYSICIAN:  Thereasa Solo. Little, M.D.              DATE OF BIRTH:  07-12-1950   DATE OF CONSULTATION:  07/01/2003  DATE OF DISCHARGE:                                   CONSULTATION   REASON FOR CONSULTATION:  This 60 year old female has a history of  hypertension, diabetes, and smokes.  She presented to the emergency room on  June 30, 2003 complaining of a left swollen and tender calf.  In addition to  that, she had an episode of chest tightness that ultimately prompted her  admission to the hospital.   Her chest pain was transient, resolved, and has not reoccurred since her  admission.  Her EKG is normal.  She has had 2 sets of cardiac enzymes that  are completely normal.  She had a cardiac catheterization March 18, 2000  that was negative for coronary artery disease, showed normal LV function and  there was no evidence of any renal artery stenosis or any abdominal aortic  aneurysm.  During this admission, she has had lower extremity Dopplers which  did not show any DVT but did show a small ruptured Baker cyst in the left  popliteal space.   A chest CT was negative for pulmonary emboli.   OUTPATIENT MEDICATIONS:  1. Toprol-XL 50 mg.  2. Altace 10 mg daily.  3. Aspirin 81 mg a day.  4. Levoxyl questionable 0.25 mg daily.  5. Hydrochlorothiazide 25 mg a day.  6. Effexor 75 mg b.i.d.  7. Verapamil 240 mg daily.   ALLERGIES:  CODEINE, PENICILLIN, NONSTEROIDALS, and SULFA DRUGS.   SOCIAL HISTORY:  The patient is married, continues to smoke cigarettes.  Does not work.  No alcohol.   FAMILY HISTORY:  Positive for heart disease at a young age in her mother.   SIGNIFICANT OTHER MEDICAL PROBLEMS:  1. A history of hypertension.  2. History of diabetes controlled with diet  alone.  3. Continued cigarette abuse.  4. History of depression and anxiety.  5. Hypothyroidism.  6. 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   REVIEW OF SYSTEMS:  Diet has been relatively stable.  She does not exercise  regularly.  Her weight is always heavy and she has not had significant  weight shifts.  She denies fever or chills, no trauma.  No GI complaints.   PHYSICAL EXAMINATION:  GENERAL:  Obese female in no acute distress.  She is  currently pain-free wanting to go home.  VITAL SIGNS:  Blood pressure is 154/94, heart rate is 58 sinus bradycardic.  NECK:  Her neck veins are not distended.  No carotid bruits are heard.  LUNGS:  Clear.  CARDIAC:  Regular rhythm with no murmur.  ABDOMEN:  Obese.  Liver edge is nonpalpable.  No epigastric tenderness.  EXTREMITIES:  She has good pulses in the upper and lower extremities.  She  has no palpable tenderness or edema of the lower extremities in particular  the left lower extremity.  NEUROLOGICAL:  Grossly intact.   ASSESSMENT:  1. Chest tightness doubt cardiac in etiology with a negative cath in 2002.     She has negative cardiac markers and a normal EKG.  There is no evidence     that this was deep vein thrombosis.  From a cardiac standpoint she could     be discharged to home with an outpatient Cardiolite set up for next week.  2. Hypertension slightly elevated today.  She assures me it is under good     control at home.  3. Diet-controlled diabetes mellitus.  I am somewhat skeptical that she is     on much of a diet and can really control her sugar that way although it     has not been significantly elevated other than 204 on a nonfasting     specimen last night.  4. Permanent pacemaker for atrioventricular nodal disease.  Sees Dr.     Alanda Amass regularly.  5. Hypothyroidism on supplemental thyroid replacement with no TSH having     been done during this admission.                                                Thereasa Solo. Little, M.D.    ABL/MEDQ  D:  07/01/2003  T:  07/01/2003  Job:  [id number]   cc:   Gerlene Burdock A. Alanda Amass, M.D.  [phone] [address], [address]  [place]  [place] [address]  Fax: [phone]   Melissa L. Ladona Ridgel, MD  [address] [place], [place] [address]  Fax: [phone]

## 2010-06-07 NOTE — Assessment & Plan Note (Signed)
[facility]                         ELECTROPHYSIOLOGY OFFICE NOTE   NAME:HORNEVeronia, [name]                        MRN:          [id number]  DATE:04/06/2006                            DOB:          May 31, 1950    [name] is referred today by Dr. Oliver Barre for ongoing pacemaker  evaluation.  She is a very pleasant middle-aged woman with a history of  symptomatic bradycardia who is status post pacemaker insertion back in  1997.  This has been discovered by Dr. Caprice Kluver and he had referred the  pacemaker implant to Dr. Evelene Croon.  Unfortunately, her procedure was  complicated by two leads being  dislodged for which she underwent lead  revision.  The patient has been stable but not had her device checked  for many years.   She denies any recurrent syncope or dizziness or lightheadedness.  She  denies chest pain and has otherwise been stable.   PAST MEDICAL HISTORY:  Also notable for:  1. Thyroid dysfunction.  2. Diabetes.   MEDICATIONS:  1. Verapamil 120 mg two tablets daily.  2. Synthroid 15 mcg a day.  3. Lisinopril/HCTZ 20/12.5 two tablets daily.  4. Glimepiride 4 mg daily.  5. Aspirin.  6. Carvedilol 6.25 mg twice daily.   FAMILY HISTORY:  Notable for both parents being deceased.  Her mother  died of kidney and heart failure problems in her 52s.  Her father died  in his 60s of prostate cancer.  She has a brother and a son both of whom  are deceased from stomach cancer.   ADDITIONAL SURGICAL HISTORY:  1. Tubal ligation in 1976.  2. Hernia repair in 1973.   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  She continues to smoke cigarettes, approximately one  pack a day and has done so for 25 years.  She denies alcohol abuse.   REVIEW OF SYSTEMS:  Notable for dyspnea on exertion.  She has some  problems with joint pain and arthritis.  She has a history of gout.  She  has gastric reflux disease.   She has chronic fatigue.  Interestingly the  patient notes more fatigue after she has been started on carvedilol.  The rest of her review of systems was negative except as noted above.   PHYSICAL EXAMINATION:  GENERAL:  She is a pleasant, obese, middle-aged  woman in no distress.  VITAL SIGNS:  Blood pressure today was 158/98, the pulse was 59 and  irregular, the respirations were 18, and the weight was 246 pounds.  HEENT:  Normocephalic and atraumatic.  Pupils equal and round.  The  oropharynx was moist.  The sclerae were anicteric.  NECK:  Revealed no jugular venous distention.  There is no thyromegaly.  The trachea is midline.  The carotids are 2+ and symmetric.  LUNGS:  Clear bilaterally to auscultation.  There are no wheezes, rales,  or rhonchi.  CARDIOVASCULAR:  Distant with a regular rate and rhythm with normal S1  and S2.  I did not appreciate any  murmurs, rubs, or gallops.  ABDOMEN:  Obese, nontender, nondistended.  There is no organomegaly.  EXTREMITIES:  Demonstrated no cyanosis, clubbing, or edema.  The pulses  were 2+ and symmetric.  NEUROLOGIC:  Alert and oriented x3 with cranial nerves intact.  Strength  was 5/5 and symmetric.   The EKG demonstrates a sinus rhythm with normal axis and intervals.   IMPRESSION:  1. History of symptomatic bradycardia.  2. Status post pacemaker insertion.  3. Hypertension.  4. Obesity.  5. Diabetes.   DISCUSSION:  Overall, [name] is stable.  She will need ongoing  evaluation of her hypertension.  She notes that she has had some fatigue  and weakness on the carvedilol today.  I have asked that she follow back  up with Dr. Oliver Barre for additional blood pressure evaluation and  consideration for changing her carvedilol to something else.  The  patient would ideally participate in a program of weight loss and  regular exercise.  We will see her back in a year to check her  pacemaker.  The device has now been in for over 10 years but  there is  still adequate battery, as her battery voltage today was 2.75 volts.  The threshold was a volt at 0.4 in both the atrium and the ventricle and  the P and R wave were 2 and 15, respectively.     Doylene Canning. Ladona Ridgel, MD  Electronically Signed    GWT/MedQ  DD: 04/06/2006  DT: 04/06/2006  Job #: [id number]   cc:   Corwin Levins, MD

## 2010-06-07 NOTE — Letter (Signed)
April 06, 2006    Corwin Levins, MD  [address]  [place], [place] [address]   RE:  MYESHIA, FOJTIK  MRN:  [id number]  /  DOB:  15-Jul-1950   Dear Rosanne Ashing:   Thank you for referring [name] for EP followup.  As you know,  she is a very pleasant middle-aged woman with a history of symptomatic  bradycardia who underwent permanent pacemaker insertion over 10 years  ago with the procedure complicated by multiple lead dislodgments and  revisions.  The patient returns today for followup.  She has been stable  though she does have many chronic medical problems including obesity,  hypertension, diabetes, and dyslipidemia.  Her pacemaker was  interrogated today and found to be working normally.  The patient's only  real complaint is that of increasing fatigue while she has been on her  Carvedilol.   I have asked the patient to continue with her Carvedilol but if her  fatigue does not resolve within a couple of weeks, I have asked that she  call your office for additional evaluation, potentially changing blood  pressure medications.  Once again, thanks again for referring Ms. Tennis Ship  for EP evaluation and pacemaker followup.  We will see her one to two  times a year and then over the phone.    Sincerely,      Doylene Canning. Ladona Ridgel, MD  Electronically Signed    GWT/MedQ  DD: 04/06/2006  DT: 04/06/2006  Job #: [id number]

## 2010-06-07 NOTE — Cardiovascular Report (Signed)
[place]. [facility]  Patient:    [name], [name]                        MRN: [id number] Proc. Date: 03/18/00 Adm. Date:  [id number] Disc. Date: [id number] Attending:  Ophelia Shoulder CC:         Richard A. Alanda Amass, M.D.             Monica Becton, M.D.                        Cardiac Catheterization  PROCEDURES: 1. Left heart catheterization. 2. Coronary angiography. 3. Left ventriculogram. 4. Abdominal aortogram.  COMPLICATIONS:  None.  INDICATIONS:  Ms. Butson is a 60 year old, black female, patient of Dr. Susa Griffins and Dr. Christell Constant, with a history of hypertension, tobacco abuse, positive family history of CAD, presented to the ER on March 18, 2000, complaining of a sudden onset of shortness of breath on March 17, 2000.  She was subsequently seen in the ER on the morning of March 18, 2000, and was given sublingual nitroglycerin and IV nitroglycerin with relief of symptoms.  Initial ECG was without ischemic changes.  She is now referred for cardiac catheterization to define her coronary anatomy.  DESCRIPTION OF PROCEDURE:  After given informed written consent, the patient was brought to the cardiac catheterization lab where her right and left groins were shaved, prepped, and draped in the usual sterile fashion.  ECG monitoring was established.  Using modified Seldinger technique, a #6 French arterial sheath was inserted in the right femoral artery.  A 6 French diagnostic catheter was then used to perform diagnostic angiography.  This reveals a large left main with no significant disease.  The LAD is a large vessel which coursed to the apex and gave rise to one bifurcating diagonal.  The LAD had no significant disease.  The first diagonal is a medium sized vessel which bifurcates in its early portion with a 20% stenotic lesion in the proximal portion of the lower branch.  The left circumflex is a large vessel which is  dominant and gives rise to the one obtuse marginal and was a PDA.  The AV groove circumflex has no significant disease.  The first OM is a large vessel which bifurcates distally and has no significant disease.  The second OM is a medium sized vessel which bifurcates in its mid segment and has no significant disease.  The right coronary artery is a small vessel which is nondominant and has no significant disease.  LEFT VENTRICULOGRAM:  The left ventriculogram reveals a preserved EF at 60%. There is noted to be LVH.  ABDOMINAL AORTOGRAM:  Abdominal aortogram reveals no evidence of renal artery stenosis.  HEMODYNAMICS:  Systemic arterial pressure 149/90, LV systemic pressure 145/18, LVEDP of 22.  CONCLUSIONS: 1. No significant coronary artery disease. 2. Normal left ventricular systolic function with left ventricular hypertrophy    noted. 3. No evidence of renal artery stenosis. DD:  03/18/00 TD:  03/19/00 Job: [id number]

## 2010-06-07 NOTE — H&P (Signed)
NAME:  Kristy, [name]                           ACCOUNT NO.:  [id number]   MEDICAL RECORD NO.:  [id number]                   PATIENT TYPE:  INP   LOCATION:  0345                                 FACILITY:  [facility]   PHYSICIAN:  Corinna L. Lendell Caprice, MD             DATE OF BIRTH:  11-08-1950   DATE OF ADMISSION:  06/30/2003  DATE OF DISCHARGE:                                HISTORY & PHYSICAL   CHIEF COMPLAINT:  Left knee pain.   HISTORY OF PRESENT ILLNESS:  Ms. [name] is a 60 year old black female with  multiple medical problems who presents to the emergency room with complaints  of left leg pain and swelling. She woke up with it this morning. She reports  that her knees sometimes swell and she was told she has osteoarthritis. This  pain is much worse than usual.  While on route to the emergency room, she  also had some chest tiredness.  She has had several admissions in the past  for chest pain, most recently September of 2004.  She had a cardiac  catheterization in February of 2002 by Richard A. Alanda Amass, M.D. which  showed no significant coronary artery disease.  She has not had any stress  test since then, however.  The chest pain is now gone. She has no shortness  of breath, no nausea or vomiting.   PAST MEDICAL HISTORY:  1. Pacemaker secondary to AV nodal conduction abnormality with syncope.  2. Hypertension.  3. Diabetes previously diet controlled although the patient does not check     her sugars at home.  4. Hypertension.  5. Tobacco abuse.  6. Hypothyroidism.  7. Depression.   MEDICATIONS:  1. Toprol XL 50 mg p.o. q.d.  2. Altace 10 mg p.o. q.d.  3. Aspirin 81 mg p.o. q.d.  4. Levoxyl, she is unsure of the dose but last discharge summary says it is     0.025 mg p.o. q.d.  5. Hydrochlorothiazide 25 mg p.o. q.d.  6. Effexor 75 mg p.o. b.i.d.  7. Verapamil 240 mg p.o. q.d.   SOCIAL HISTORY:  The patient is married and is here with her husband, she is  not working  currently. She smokes a pack of cigarettes a day, she does not  drink or use drugs.   FAMILY HISTORY:  Her mother died of a heart attack at age 38.   REVIEW OF SYSTEMS:  As above otherwise negative.   PHYSICAL EXAMINATION:  VITAL SIGNS:  Her temperature is 97.3, blood pressure  195/115, pulse 91, respiratory rate 20, oxygen saturation 98%.  GENERAL:  The patient is well-nourished, well-developed, somewhat groggy  after receiving morphine in no acute distress.  HEENT:  Normocephalic, atraumatic.  Pupils equal round and reactive to  light.  Sclerae nonicteric, moist mucous membranes.  NECK:  Supple, no carotid bruits.  LUNGS:  Clear to auscultation bilaterally without wheezes, rhonchi or rales.  CARDIOVASCULAR:  Regular rate and rhythm without murmur, gallop or rub. No  chest wall tenderness.  ABDOMEN:  Soft, nontender, nondistended.  GU/RECTAL:  Deferred.  EXTREMITIES:  No cyanosis, clubbing or edema. Pulses are intact. She has  some slight calf tenderness on the left, Homan's sign is negative.  She is  very tender in the popliteal area. There is no joint effusion, she has  normal range of motion.   LABORATORY DATA:  CBC is essentially unremarkable, D-dimer normal, INR  normal.  Basic metabolic panel significant for a glucose of 204 otherwise  normal.  CPK, MB, troponin are normal. UA negative.   EKG shows normal sinus rhythm.  CT of the chest shows no pulmonary embolus.   ASSESSMENT/PLAN:  1. Left leg pain.  It is too late to get a PVL of the leg tonight to the     patient has received a dose of Lovenox. She will be admitted to telemetry     on bed rest and have a PVL in the morning to rule out deep venous     thrombosis.  2. Chest pain.  CAT scan of the chest was negative for pulmonary embolus, I     will get two more sets of cardiac enzymes.  Her EKG shows nothing acute.     Consider consulting with Richard A. Alanda Amass, M.D. to set up either     inpatient or outpatient  Cardiolite.  She has had a negative     catheterization but that was three years ago and she has several risk     factors for coronary artery disease.  If this turns out to be noncardiac     consider starting proton pump inhibitor as the patient has had several     admissions in the past for atypical chest pain.  3. Diabetes.  Certainly this does not appear to be diet controlled.  I will     monitor CBG's, she will be on a diabetic diet. She may need an oral     hypoglycemic agent started.  I will also check a hemoglobin A1C. She     would benefit from diabetes classes.  4. Hypertension.  Continue her outpatient medications.  Her blood pressure     is better currently.  5. Tobacco abuse.  Counseled against.  6. Hypothyroidism.  Continue Synthroid.  7. Pacemaker.  History of pacemaker.  8. Depression.  Continue outpatient medications.                                               Corinna L. Lendell Caprice, MD    CLS/MEDQ  D:  06/30/2003  T:  07/01/2003  Job:  [id number]   cc:   [facility]   Richard A. Alanda Amass, M.D.  [phone] [address]  [place]  [place] [address]  Fax: [phone]

## 2010-06-07 NOTE — Discharge Summary (Signed)
[name], DAM                 ACCOUNT NO.:  [id number]   MEDICAL RECORD NO.:  [id number]          PATIENT TYPE:  OBV   LOCATION:  4712                         FACILITY:  [facility]   PHYSICIAN:  Alvester Morin, M.D.  DATE OF BIRTH:  11/07/1950   DATE OF ADMISSION:  10/07/2005  DATE OF DISCHARGE:  10/08/2005                                 DISCHARGE SUMMARY   DISCHARGE DIAGNOSES:  1. Chest pain, most likely secondary to GERD.  2. Hypertension.  3. Hyperlipidemia.  4. 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  5. Diabetes type 2 x1 year.  6. Total abdominal hysterectomy, bilateral salpingo-oophorectomy 1990 for      fibroids.  7. BTO 1976.  8. Inguinal hernia repair 1970.  9. Gastroesophageal reflux disease.  10.Hypothyroidism  11.Tobacco abuse   DISCHARGE MEDICATIONS:  1. Aspirin 81 mg p.o. daily.  2. Zocor 10 mg p.o. daily.  3. HCTZ 25 mg p.o. daily.  4. Glimepiride 4 mg p.o. daily.  5. Lisinopril 20 mg p.o. daily.  6. Synthroid 50 mcg p.o. daily.  7. Pepcid 40 mg p.o. daily.   DISPOSITION AND FOLLOWUP:  Patient will follow-up at the [facility]   The patient asked [place] not to make an appointment for her because she said  that she did not have the money to go to [facility] at this time.  She  said that as soon as she had the money, she would make an appointment. She  agreed to return to the emergency department for any further episodes of  chest pain.   THINGS TO DO AT FOLLOWUP:  Please check a BMET to followup on potassium and  creatinine because we started a diuretic, HCTZ, upon this discharge.  Also  check that she is tolerating the HCTZ well.  Please note that her verapamil  was stopped so ithat she is on 2 instead of 3 blood pressure medications  (HCTZ and Lisinopril). Also, please check the status of the patient's chest  pain and if has recurred.   PROCEDURES PERFORMED:  1.  Echocardiogram:      a.     Overall, left ventricular systolic function was normal.  Left       ventricular ejection fraction was estimated to be 65%.  There were no       left ventricular regional wall motion abnormalities.  Left ventricular       wall thickness was moderately increased.      b.     Aortic valve thickness was mildly increased.  There was mildly       reduced aortic valve leaflet excursion.  The mean thoracic valve       gradient was 8 mmHg.      c.     There was mild mitral valve anular calcification.      d.     Left atrial size was at the upper limits of normal.      e.     There was  mild septal dysenergy related to pacing function.      f.     Overall impression:  There is mild septal dysenergy related to       pacing function.  2. Doppler of lower extremities.  The Doppler exam of the lower      extremities was negative for DVT.  3. We recommended cardiology consult for possible cardiolite.  However,      the patient refused this because she said she needed to leave the      hospital so she could get to her job.  Therefore, we were not able to      conduct this study.  In fact, the patient even refused to wait for the      cardiology recommendations.  4. Chest x-ray impression:  No active disease.   BRIEF ADMITTING H&P:  Patient is a 60 year old female with a history of  diabetes type 2, hypertension, hyperlipidemia, sick sinus syndrome and  previous chest pain with clean cath in 2002, presents with substernal chest  pressure described as a squeezing that began when the patient woke up the  morning of admission, October 07, 2005.  The pain gradually subsided over  the next few hours, but returned approximately 12:30 p.m. and has been  present since.  The pain is located under the left breast and does not  radiate.  The patient denies any shortness of breath, but has had stable  dyspnea on exertion when climbing several stairs, that she reports as having  this  condition for years.  Patient also describes a several year history of  3 pillow orthopnea and PND.  Patient has also had a history of OSA,  initially treated with CPAP, but has been unable to continue with CPAP x5  years secondary to loss of insurance.  Patient denies nausea, vomiting,  diarrhea or constipation, headache and dizziness, as well as a cough.  Patient does report longstanding calf pain and swelling with the left  greater than the right, which has been worked up as an outpatient without  any significant pathology.  Patient denies any recent travel, immunization,  surgery or cancer, but does have a history of lower extremity DVT  approximately 30 years ago.   ALLERGIES:  1. PENICILLIN, UNKNOWN REACTION AS A CHILD.  2. NSAIDS CAUSE GI UPSET.   ADMISSION MEDICATIONS:  1. Verapamil 240 mg p.o. daily.  2. ASA 81 mg p.o. daily.  3. Zocor 10 mg p.o. daily.  4. Metoprolol 50 mg p.o. daily.  5. Protonix 40 mg p.o. daily.  6. Glimepiride 4 mg p.o. daily.  7. Lisinopril 20 mg p.o. daily.   SUBSTANCE ABUSE:  Patient is a current a smoker of 1/2 to 3 packs per day  x30 years.  She does not drink alcohol and denies cocaine or IV drug use.  Patient is married.  Has finish high school, although is unemployed.  Her  insurance is self-pay.   HOME SUPPORT:  She lives with her husband.   FAMILY HISTORY:  Mother died at 25 from renal failure.  Father died at 74 of  prostate cancer.  Brother died at 78 of alcohol abuse.  Son died at 57 of  gastric cancer and one of her children had a congenital heart defect that  was repaired at age 61.   PHYSICAL EXAM:  VITAL SIGNS:  Temperature 98.7.  Blood pressure 140/82.  Pulse 69.  Respiratory rate 18.  O2 saturation 99% on room air.  GENERAL:  Patient is bed, in no acute distress.  HEENT:  Eyes:  Pupils equally round and reactive to light and accommodation. Extraocular muscles intact.  Sclerae are clear.  ENT:  Moist mucous  membranes.   Oropharynx:  Mildly erythematous.  NECK:  Supple without  lymphadenopathy.  RESPIRATORY:  Decreased breath sounds without wheezes or crackles.  CARDIOVASCULAR:  Regular rate and rhythm without murmurs, rubs or gallops.  GI:  Positive bowel sounds.  Soft, nontender, nondistended without  hepatosplenomegaly.  EXTREMITIES:  No clubbing, cyanosis or edema, 2+ radial posterior tibialis  and dorsalis pedis pulses bilaterally.  Bilateral calf tenderness with the  right worse than left.  GU:  Is deferred.  SKIN:  Warm and dry without rash.  LYMPHADENOPATHY:  No cervical or supraclavicular  lymphadenopathy.  MUSCULOSKELETAL:  Moving upper and lower extremities bilaterally.  Full  range of motion.  NEURO:  Cranial nerves II-XII grossly intact, 5/5 strength in the upper and  lower extremities bilaterally.  Sensation to light touch grossly intact.  PSYCH:  Alert and oriented x3.  Appropriate.   LABORATORY:  Sodium 139, potassium 4.3, chloride 104, CO2 25, BUN 14,  creatinine 1.1, glucose 89.  White blood cell count 9.6, hemoglobin 13.2,  hematocrit 40.2, platelets 281, MCV 93.2.  Anion gap 10, bilirubin 0.5,  alkaline phosphatase 90, AST 27, ALT 15, protein 7.2, albumin 3.8, calcium  9.2.  PT 11.7, INR 0.9, D-dimer 0.34.  Cardiac enzymes:  Troponin I less  than 0.05 x2 sets.   Chest x-ray:  No acute or active cardiopulmonary disease.   EKG was normal sinus rhythm with decreased T-waves in V1 and flat T and AVL.  This is stable from previous EKGs.   HOSPITAL COURSE:  1. Chest pain:  The patient was monitored on telemetry with no abnormalited documented.  Serial cardiac enzymes and troponins were negative and EKG's showed no acute  changes. She had no further episodes of chest pain during her  hospitalization.  We had hoped to consult cardiology for further work-up.  However, as noted above, the patient refused further work-up. She expressed  understanding of the risks and consequences of  leaving the hospital without  proper workup.  Nonetheless, she still wanted to leave.  We advised her to  follow up with [facility] as soon as possible and to return to the  emergency department if any of her chest pain got worse or recurred.  The  patient stated that she had to work to get money and if she did not leave  the hospital, there was chance she would be fired.  1. GERD:  Because she could not afford her medicine, the patient had not      been taking her PPI for approximately 2 weeks prior to admission.  This      is possibly the etiology of the patient's chest pain. she was given      samples of an H2 blocker (famotidine) and instructed to follow up with      [facility] as soon as possible.  2. Diabetes type 2:  Hemoglobin A1c was found to be 7.9. We continued her      on glimepiride 4 mg p.o. daily.  3. Hyperlipidemia:  We checked a fasting lipid profile during the     patient's stay here, which showed a triglyceride of 132, total      cholesterol of 211, HDL 50, LDL 135.  Patient was started on Zocor 10  mg p.o. daily. she will need hepatic function monitored and a fasting      lipid panel in 4 to 6 weeks to see if her Zocor dose is adequate.  4. Lower extremity pain:  Patient has reported this leg pain for quite      some time and is diagnosed as a chronic problem with previous workup.      Doppler exam of lower extremities was negative for DVT.  This was      obtained given the patient's prior history of DVT 30 years prior.      Possibility that the patient's leg pain is neuropathic or even      muscular.  We will use Tylenol to control her pain as needed.  5. Hypertension:  Patient's blood pressure was mildly elevated for a      diabetic. The patient was sent home on a blood pressure regimen of      lisinopril and HCTZ.  Please note that her verapamil was discontinued.      Her blood pressure was adequately controlled on the 2      antihypertensives.  6. Tobacco  abuse:  The patient was counseled on smoking cessation. She      reports that she is in the process of trying to quit.  7. Hypothyroidism:  The patient had a TSH of  10.4, consistent with      hypothyroidism.  Therefore, the patient was started on50 mcg daily.      Her TSH will need to be checked in 4-6 weeks to see if the Synthroid      dose needs adjusting.   DISCHARGE LAB AND VITALS:  Temperature 98.2.  Pulse 66.  Respirations 20.  CBG 111.  Blood pressure 151/83.  Saturating 99% on room air.  White blood  cell count 10.6, hemoglobin 13.0, hematocrit 39.6, platelets 280, sodium  140, potassium 4.1, chloride 105, CO2 30, glucose 124, BUN 11, creatinine  0.9, calcium 9.1, hemoglobin A1c 7.9.  Cardiac enzymes, at this time, are  negative x3.  TSH 10.4.  UDS positive for opioids.  UA has specific gravity  of 1.019, pH 6.0; otherwise, it was normal.      Thereasa Solo, M.D.  Electronically Signed      Alvester Morin, M.D.  Electronically Signed    AS/MEDQ  D:  10/08/2005  T:  10/08/2005  Job:  [id number]

## 2010-06-07 NOTE — Discharge Summary (Signed)
[place]  Patient:    [name], [name] Visit Number: [id number] MRN: [id number]          Service Type: MED Location: 2000 2039 [id number] Attending Physician:  Virgina Evener Dictated by:   [facility]C. Admit Date:  12/22/2000 Discharge Date: 12/24/2000   CC:         Richard A. Alanda Amass, M.D., [facility]   Discharge Summary  ADMISSION DIAGNOSES: 1. Atypical chest pain. 2. Hypertension. 3. History of permanent transvenous pacemaker 1997 secondary to    atrioventricular nodal conduction abnormality with syncope. 4. History of no significant coronary artery disease and ejection fraction 60%    at prior catheterization. 5. Hypothyroidism with a goiter. 6. Premature family history of coronary artery disease with mother who is    deceased at age 36 with myocardial infarction. 7. Tobacco use.  DISCHARGE DIAGNOSES: 1. Atypical chest pain. 2. Hypertension. 3. History of permanent transvenous pacemaker 1997 secondary to    atrioventricular nodal conduction abnormality with syncope. 4. History of no significant coronary artery disease and ejection fraction 60%    at prior catheterization. 5. Hypothyroidism with a goiter. 6. Premature family history of coronary artery disease with mother who is    deceased at age 5 with myocardial infarction. 7. Tobacco use.  HISTORY OF PRESENT ILLNESS:  The patient is a 60 year old African-American female who presented to [facility] Emergency Room on the evening of December 22, 2000 and early morning of December 23, 2000 with complaints of severe chest pain and shortness of breath.  Her chest pain had started approximately 6 p.m. on the evening of December 22, 2000 while she was at rest and it gradually worsened.  It localized to the left side of the chest under the breast and was a cramping-like sensation.  She then came to the emergency room where she was started on a  nitroglycerin drip which relieved her discomfort but at the time of our assessment later she still did have residual pain.  She has had no diaphoresis or nausea with it.  On exam at that time she was afebrile, NID, had a blood pressure of 120/84, heart rate 60.  Oxygen saturations were 96% on room air.  No significant abnormalities on physical exam.  Initial labs were all stable and initial enzymes negative.  EKG showed sinus bradycardia with no acute change.  At that time she was seen and evaluated by Dr. Nicki Guadalajara.  It is felt that her chest pain seemed atypical in nature.  First set of enzymes were normal and an EKG without change.  We would continue to monitor enzymes.  Wean and discontinue IV nitroglycerin, add NSAID therapy, add proton pump.  HOSPITAL COURSE:  On the morning of December 24, 2000, the patient says that her chest pain is approximately 50% better today.  She does continue to have some residual pain.  Her lungs are clear, her heart is in regular rhythm without murmur, rub or gallop, she has no edema.  She has had four serial EKGs since her admission, they all show sinus bradycardia with no ischemic change. She has enzymes x 3 which were all within normal limits.  It is felt that her pain is clinically compatible with dysphagia.  She is seen an evaluated by Dr. Lavonne Chick who deems her stable for discharge home at this time.  HOSPITAL CONSULTATIONS:  None.  HOSPITAL PROCEDURES:  None.  HOSPITAL LABORATORIES:  In the  emergency room, on arrival on December 22, 2000, CBC shows WBC 10.4, RBC 4.3, hemoglobin 12.6, hematocrit 38.5, MCV 89.5, MCHC 32.9, RDW 14.4, and platelet 315.  Sodium 141, potassium 3.8, chloride 104, BUN 13, creatinine 0.9.  Enzymes showed CKs of 255 and 141, MB 2.2, 1.2, troponin less than 0.01 x 3.  Lipid profile shows total cholesterol 200, triglycerides 63, HDL 51, LDL 136.  EKGs:  All serial EKGs x 4 show sinus bradycardia with no ST-T  change.  DISCHARGE MEDICATIONS: 1. Protonix 40 mg once a day. 2. Vioxx 25 mg once a day. 3. Baby aspirin 81 mg a day. 4. Altace at previous dose which she thinks was 10 mg. 5. Toprol XL at the previous dose which she thinks was 25 mg. 6. Verapamil 240 mg once a day as before.  DIET:  Low-salt, low-fat, low-cholesterol diet; bland, nonspicy diet especially over the next week.  FOLLOWUP:  The office is closed today secondary to weather.  She is to call [phone] tomorrow assuming the office is open at that point and make an appointment to see Dr. Alanda Amass back in the next several weeks. Dictated by:   [facility]C. Attending Physician:  Virgina Evener DD:  12/24/00 TD:  12/24/00 Job: [id number]

## 2010-06-07 NOTE — Discharge Summary (Signed)
NAME:  [name], [name]                           ACCOUNT NO.:  [id number]   MEDICAL RECORD NO.:  [id number]                   PATIENT TYPE:  INP   LOCATION:  0345                                 FACILITY:  [facility]   PHYSICIAN:  Melissa L. Ladona Ridgel, MD               DATE OF BIRTH:  07-09-1950   DATE OF ADMISSION:  06/30/2003  DATE OF DISCHARGE:  07/01/2003                                 DISCHARGE SUMMARY   ADMITTING COMPLAINT:  1. Leg pain.  2. Chest pain.  3. Diabetes.  4. Hypertension.  5. Tobacco abuse.  6. Hypothyroidism.   DISCHARGE DIAGNOSES:  1. Left leg pain.  Patient underwent Doppler of lower extremities which     showed no deep venous thrombosis. However, she did have a ruptured     Baker's cyst on her left leg.  2. Chest pain. The patient had no further complaints of chest pain. Her     chest CT was negative for PE and her cardiac enzymes were negative for     MI.  3. Diabetes. The patient up to this point had been diet controlled with her     diabetes; however, her hemoglobin A1C was slightly elevated at 7.6.  It     was therefore recommended that she begin Metformin 500 mg p.o. daily with     up titration as an outpatient.  4. Hypertension, currently controlled.  5. Tobacco abuse. The patient was given counseling against discontinuation     of cigarette smoking, especially in light of her cardiovascular disease.  6. Hypothyroidism, which is stable on Synthroid.   MEDICATIONS AT TIME OF DISCHARGE:  1. Toprol XL 50 mg p.o. daily.  2. Altace 10 mg p.o. daily.  3. Aspirin 81 mg p.o. daily.  4. Levoxyl the dose of which is unknown.  5. Hydrochlorothiazide 25 mg p.o. daily.  6. Effexor 75 mg p.o. b.i.d.  7. Verapamil 240 mg p.o. daily.  8. A new medication, Metformin 500 mg p.o. daily.   PAIN MANAGEMENT:  Vicodin one tablet p.o. q.4-6h. p.r.n. pain.  Prescription  was not given for this medication. The patient evidently had this at home.   ACTIVITY:  No  restriction.   DIET:  No-concentrated sweets and cardiac-prudent, low cholesterol.   FOLLOWUP:  The patient is to follow up with [facility] in one week and Dr.  Clarene Duke as scheduled.   CONSULTATIONS:  With [facility]. It was determined  that with a negative cath in 2002 that the patient was experiencing  cardiovascular origin for her chest pain. She therefore will be set up an  outpatient Cardiolite stress test in one week.   HISTORY OF PRESENT ILLNESS:  The patient is a 60 year old African American  female who presented to the emergency room complaining of left leg pain as  well as chest pain.  She has a significant cardiac history for pacemaker,  hypertension, diabetes which has been uncontrolled. The patient was admitted  to the telemetry floor and worked up for PE/DVT. Chest CT was negative.  Dopplers were negative. She was found to have a ruptured Baker's cyst which  would explain the left leg pain and tenderness.  Her chest discomfort  resolved within the first hours of her admission. Her cardiac enzymes  remained negative times two and her outpatient cardiac group evaluated her  current symptoms and determined that it would be appropriate to have her  followed up as an outpatient for a Cardiolite stress test. She was  discharged to home on her home cardiovascular regimen and the only addition  to her therapy was a new medication for her diabetes.   Her hospital course was uneventful except for one episode of dizziness on  arising. Orthostatics were negative and the dizziness resolved. The patient  remained quite sleepy secondary to receiving narcotic medication. This also  resolved at the time of discharge. Her vital signs were stable. Her exam  remained per history and physical, an obese African American female in no  acute distress.  Pupils equal, round, and reactive to light. Extraocular  movements intact. Mucous membranes were moist. The neck was  supple. There  was no JVD, no lymph nodes, and no carotid bruits. Her lungs were clear.  Cardiac revealed a regular rate and rhythm with positive S1 and S2. No  murmurs. Abdomen was obese. Extremities showed 2+ pulses, no edema.  Neurologically, there were no focal findings.   The patient had no obvious source for her left lower leg pain which is a  ruptured Baker's cyst. She will continue with her Vicodin tablets which she  has at home and follow up with her [facility] physician in the next week.  At the time of discharge the patient is stable to be seen by her primary  care physician, and having had a outpatient Cardiolite stress test.                                               Melissa L. Ladona Ridgel, MD    MLT/MEDQ  D:  07/09/2003  T:  07/10/2003  Job:  [id number]   cc:   Thereasa Solo. Little, M.D.  [address]  [address]  [place]  [place] [address]  Fax: [phone]   [facility]

## 2010-06-07 NOTE — Discharge Summary (Signed)
NAME:  [name], [name]                           ACCOUNT NO.:  [id number]   MEDICAL RECORD NO.:  [id number]                   PATIENT TYPE:  INP   LOCATION:  4741                                 FACILITY:  [facility]   PHYSICIAN:  Tamika J. Lazarus Salines, M.D.                DATE OF BIRTH:  11/30/1950   DATE OF ADMISSION:  10/08/2002  DATE OF DISCHARGE:  10/09/2002                                 DISCHARGE SUMMARY   DISCHARGE DIAGNOSES:  1. Presyncope.  2. Atypical chest pain.  3. Hypertension.  4. Bradycardia, status post pacemaker.  5. Type 2 diabetes mellitus.  6. Hypothyroidism.   DISCHARGE MEDICATIONS:  1. Toprol XL 50 mg daily.  2. Altace 10 mg daily.  3. Aspirin 81 mg daily.  4. Hydrochlorothiazide 25 mg daily.  5. Verapamil 240 mg daily.  6. Levoxyl 0.025 mg daily.   FOLLOW UP:  1. The patient was instructed to call [facility] on     Monday to schedule a follow-up appointment with Dr. Ian Bushman.  2. She was also instructed to schedule an appointment with Dr. Alanda Amass in     two weeks.   HISTORY OF PRESENT ILLNESS:  Please see full H&P for details.  In brief, the  patient is a 60 year old African American female with past medical history  significant for hypertension, bradycardia status post pacemaker and  hypothyroidism who presented with one day history of lightheadedness, lip  tingling and intermittent chest pain.  The patient reported felt very  lightheaded and unsteady on her feet.  She said symptoms were similar to how  she felt prior to receiving her pacemaker.  She also reported that she is  very sensitive to her pacemaker but has not felt her pacemaker go off for  the entire day prior to his admission.  She is followed by Dr. Alanda Amass for  her pacemaker but she has not seen him for quite a while.  She was admitted  for further evaluation.   CONSULTATIONS:  [facility], Dr. Jacinto Halim.   PROCEDURE:  Head CT was negative.   HOSPITAL  COURSE:  PROBLEM #1 -  PRESYNCOPE:  The patient was monitored on  telemetry and maintained a normal sinus rhythm with heart rate fluctuating  from 56 to 99 beats per minute.  She was gently hydrated with IV fluids.  On  the day of discharge she reported feeling much better.  She was ruled out  for MI with serial cardiac enzymes.  Her neurologic examination was  completely normal.  Cardiology did evaluate the patient and felt that she  was stable from cardiac standpoint.   PROBLEM #2 -  HYPERTENSION:  Blood pressure remained fairly stable.  She was  continued on her home medications.   Her potassium was repleted prior to discharge.  Did consider the possibility  of medications contributing to her symptoms; however, Dr.  Ganji recommended  that we continue all of her medications until she follows up with Dr.  Alanda Amass.   DISCHARGE LABORATORY DATA:  Sodium 138, potassium 3.4, chloride 104, CO2 31,  BUN 7, creatinine 0.8, glucose 127, alkaline phosphatase 71, SGOT 17, SGPT  12, total protein 6.6, albumin 3.4, calcium 8.7.  Hemoglobin 12.6, wbc 7.2,  platelets 268.  TSH pending.   DISPOSITION:  The patient was discharged to home in stable condition.                                                Tamika J. Lazarus Salines, M.D.    Nadara Eaton  D:  10/09/2002  T:  10/10/2002  Job:  [id number]   cc:   Doug Sou, M.D.  [address]  [place] [address]  Fax: [phone]   Richard A. Alanda Amass, M.D.  [phone] [address], [address]  [place]  [place] [address]  Fax: [phone]   Saul Fordyce, M.D.  [employer]

## 2010-09-12 ENCOUNTER — Other Ambulatory Visit (HOSPITAL_COMMUNITY): Payer: Self-pay | Admitting: Internal Medicine

## 2010-09-12 DIAGNOSIS — Z1231 Encounter for screening mammogram for malignant neoplasm of breast: Secondary | ICD-10-CM

## 2010-09-19 ENCOUNTER — Ambulatory Visit (HOSPITAL_COMMUNITY)
Admission: RE | Admit: 2010-09-19 | Discharge: 2010-09-19 | Disposition: A | Discharge status: Home / Self Care | Payer: Self-pay | Source: Ambulatory Visit | Attending: Internal Medicine | Admitting: Internal Medicine

## 2010-09-19 DIAGNOSIS — Z1231 Encounter for screening mammogram for malignant neoplasm of breast: Secondary | ICD-10-CM

## 2010-10-18 LAB — POCT I-STAT, CHEM 8
BUN: 17
Calcium, Ion: 1.19
Chloride: 103
Creatinine, Ser: 1.1
Glucose, Bld: 81
HCT: 41
Hemoglobin: 13.9
Potassium: 3.6
Sodium: 140
TCO2: 33

## 2010-10-18 LAB — APTT: aPTT: 31

## 2010-10-18 LAB — PROTIME-INR
INR: 1
Prothrombin Time: 13.3

## 2010-12-31 ENCOUNTER — Other Ambulatory Visit: Payer: Self-pay | Admitting: Obstetrics and Gynecology

## 2010-12-31 ENCOUNTER — Ambulatory Visit (INDEPENDENT_AMBULATORY_CARE_PROVIDER_SITE_OTHER): Payer: Self-pay | Admitting: *Deleted

## 2010-12-31 VITALS — BP 185/109 | HR 70 | Temp 97.2°F | Resp 18 | Ht 65.0 in | Wt 179.9 lb

## 2010-12-31 DIAGNOSIS — Z01419 Encounter for gynecological examination (general) (routine) without abnormal findings: Secondary | ICD-10-CM | POA: Insufficient documentation

## 2010-12-31 DIAGNOSIS — N63 Unspecified lump in unspecified breast: Secondary | ICD-10-CM

## 2010-12-31 NOTE — Progress Notes (Signed)
Complaints of left breast lump.  Pap Smear:    Pap smear completed today since patient could not remember when last Pap smear was completed. Patient has a history of a hysterectomy for fibroids. Per patient no history of abnormal Pap smears . If this Pap smear is normal patient will not need further Pap smears per BCCCP and ACOG guidelines. No Pap smear results in EPIC.  Physical exam: Breasts Breasts symmetrical. No skin abnormalities bilateral breasts. Patient has a scar where pacemaker is at in right upper chest. No nipple retraction bilateral breasts. No nipple discharge bilateral breasts. No lymphadenopathy. No lumps palpated right breast. Palpated lump in left breast around 10 o'clock. Per patient new lump that she has noticed within the last week. Patient complained of tenderness on palpation of lump within left breast.          Pelvic/Bimanual   Ext Genitalia No lesions, no swelling and no discharge observed on external genitalia.         Vagina Vagina pink and normal texture. No lesions or discharge observed in vagina.          Cervix Cervix is not present related to a history of a hysterectomy.          Uterus Uterus is absent due to a history of a hysterectomy.         Adnexae Bilateral ovaries not palpable. Per patient unsure if ovaries were removed with hysterectomy. No tenderness on palpation.        Rectovaginal No rectal exam completed today since patient had no rectal complaints. No skin abnormalities observed on exam.

## 2010-12-31 NOTE — Patient Instructions (Signed)
Taught patient how to perform BSE and gave educational materials to take home. Pap smear performed today since patient could not remember when had last Pap smear. Patient has had a hysterectomy for fibroids. Let patient know if this Pap smear comes back normal that she will not need further Pap smears per BCCCP guidelines. [facility]erred [facility] to [facility] for Diagnostic mammogram of left breast and left breast ultrasound on January 02, 2011 at 1040. Gave patient appointment and details. Patient complained of loosing 107 lbs in last 8 months without trying. Referred patient to primary care doctor for follow up. Patient stated doctor will not order tests due to costs. Informed patient that if gets procedures in the hospital can meet with a financial counselor and fill out financial assistance paperwork.  Let patient know will follow up with her within the next couple weeks with results by letter or phone. Patient verbalized understanding.

## 2011-01-01 ENCOUNTER — Other Ambulatory Visit (HOSPITAL_COMMUNITY)
Admission: RE | Admit: 2011-01-01 | Discharge: 2011-01-01 | Disposition: A | Discharge status: Home / Self Care | Payer: Self-pay | Source: Ambulatory Visit | Attending: Obstetrics and Gynecology | Admitting: Obstetrics and Gynecology

## 2011-01-02 ENCOUNTER — Ambulatory Visit
Admission: RE | Admit: 2011-01-02 | Discharge: 2011-01-02 | Disposition: A | Discharge status: Home / Self Care | Payer: No Typology Code available for payment source | Source: Ambulatory Visit | Attending: Obstetrics and Gynecology | Admitting: Obstetrics and Gynecology

## 2011-01-02 ENCOUNTER — Other Ambulatory Visit: Payer: Self-pay | Admitting: Obstetrics and Gynecology

## 2011-01-02 DIAGNOSIS — N63 Unspecified lump in unspecified breast: Secondary | ICD-10-CM

## 2011-01-06 ENCOUNTER — Encounter: Payer: Self-pay | Admitting: Obstetrics and Gynecology

## 2011-01-08 ENCOUNTER — Telehealth: Payer: Self-pay | Admitting: *Deleted

## 2011-01-08 NOTE — Telephone Encounter (Signed)
Pt left message stating that she hasn't gotten her pap smear results yet. Please call with results.

## 2011-01-08 NOTE — Telephone Encounter (Signed)
Called patient and gave her results 12/31/10 pap( negative), no other questions

## 2011-02-19 ENCOUNTER — Encounter (HOSPITAL_COMMUNITY): Payer: Self-pay | Admitting: Emergency Medicine

## 2011-02-19 ENCOUNTER — Emergency Department (HOSPITAL_COMMUNITY)
Admission: EM | Admit: 2011-02-19 | Discharge: 2011-02-19 | Disposition: A | Discharge status: Home / Self Care | Payer: Self-pay | Attending: Emergency Medicine | Admitting: Emergency Medicine

## 2011-02-19 DIAGNOSIS — F17200 Nicotine dependence, unspecified, uncomplicated: Secondary | ICD-10-CM | POA: Insufficient documentation

## 2011-02-19 DIAGNOSIS — K648 Other hemorrhoids: Secondary | ICD-10-CM | POA: Insufficient documentation

## 2011-02-19 DIAGNOSIS — R10819 Abdominal tenderness, unspecified site: Secondary | ICD-10-CM | POA: Insufficient documentation

## 2011-02-19 DIAGNOSIS — K644 Residual hemorrhoidal skin tags: Secondary | ICD-10-CM | POA: Insufficient documentation

## 2011-02-19 DIAGNOSIS — K649 Unspecified hemorrhoids: Secondary | ICD-10-CM

## 2011-02-19 DIAGNOSIS — I10 Essential (primary) hypertension: Secondary | ICD-10-CM | POA: Insufficient documentation

## 2011-02-19 DIAGNOSIS — K6289 Other specified diseases of anus and rectum: Secondary | ICD-10-CM | POA: Insufficient documentation

## 2011-02-19 DIAGNOSIS — E119 Type 2 diabetes mellitus without complications: Secondary | ICD-10-CM | POA: Insufficient documentation

## 2011-02-19 HISTORY — DX: Unspecified atrial fibrillation: I48.91

## 2011-02-19 HISTORY — DX: Unspecified atrial flutter: I48.92

## 2011-02-19 HISTORY — DX: Reserved for concepts with insufficient information to code with codable children: IMO0002

## 2011-02-19 LAB — URINALYSIS, ROUTINE W REFLEX MICROSCOPIC
Bilirubin Urine: NEGATIVE
Glucose, UA: NEGATIVE mg/dL
Hgb urine dipstick: NEGATIVE
Ketones, ur: NEGATIVE mg/dL
Leukocytes, UA: NEGATIVE
Nitrite: NEGATIVE
Protein, ur: NEGATIVE mg/dL
Specific Gravity, Urine: 1.016 (ref 1.005–1.030)
Urobilinogen, UA: 0.2 mg/dL (ref 0.0–1.0)
pH: 7 (ref 5.0–8.0)

## 2011-02-19 LAB — OCCULT BLOOD, POC DEVICE: Fecal Occult Bld: NEGATIVE

## 2011-02-19 LAB — POCT I-STAT, CHEM 8
BUN: 11 mg/dL (ref 6–23)
Calcium, Ion: 1.22 mmol/L (ref 1.12–1.32)
Chloride: 101 mEq/L (ref 96–112)
Creatinine, Ser: 1 mg/dL (ref 0.50–1.10)
Glucose, Bld: 105 mg/dL — ABNORMAL HIGH (ref 70–99)
HCT: 40 % (ref 36.0–46.0)
Hemoglobin: 13.6 g/dL (ref 12.0–15.0)
Potassium: 3.1 mEq/L — ABNORMAL LOW (ref 3.5–5.1)
Sodium: 143 mEq/L (ref 135–145)
TCO2: 29 mmol/L (ref 0–100)

## 2011-02-19 MED ORDER — POTASSIUM CHLORIDE CRYS ER 20 MEQ PO TBCR
40.0000 meq | EXTENDED_RELEASE_TABLET | Freq: Once | ORAL | Status: AC
  Administered 2011-02-19: 40 meq via ORAL
  Filled 2011-02-19: qty 2

## 2011-02-19 NOTE — ED Provider Notes (Signed)
History     CSN: [id number]  Arrival date & time 02/19/11  1528   First MD Initiated Contact with Patient 02/19/11 1559      No chief complaint on file.   (Consider location/radiation/quality/duration/timing/severity/associated sxs/prior treatment) HPI History provided by pt.   Pt presents w/ cc of rectal bleeding.  Has had constipation for the past 2 weeks.  She feels as though her "straining muscles" are getting weak.  Only able to have a BM if she takes a laxative.  Most recent BM yesterday; was loose but no melena/hematochezia.  Today she noticed blood on toilet paper when she wiped after urinating.  Has not had any bleeding since then.  Associated w/ burning pain in rectum and mild pain in lower abdomen.  Has had relief of rectal pain w/ hydrocortisone 1% cream.  Denies fever, N/V and urinary sx.  Denies lightheadedness, fatigue and SOB.  Has never had rectal bleeding in the past.  Has never had a colonoscopy.  No FH of colon cancer.   Past Medical History  Diagnosis Date  . Anemia   . Hypertension   . Thyroid disease   . Diabetes mellitus     Past Surgical History  Procedure Date  . Vaginal hysterectomy     Family History  Problem Relation Age of Onset  . Colon cancer Father     prostate  . Hypertension Mother     History  Substance Use Topics  . Smoking status: Current Some Day Smoker -- 1.0 packs/day for 30 years    Types: Cigarettes  . Smokeless tobacco: Not on file  . Alcohol Use: No    OB History    Grav Para Term Preterm Abortions TAB SAB Ect Mult Living   3    1 1    2       Review of Systems  Constitutional:       Pt has lost 117lbs unintentionally over the past 10 months.    Respiratory: Negative for cough and shortness of breath.        Smoker x 41yrs, currently 1/2 PPD  All other systems reviewed and are negative.    Allergies  Codeine phosphate; Naproxen; Nsaids; Penicillins; Propoxyphene n-acetaminophen; and Sulfamethoxazole  w/trimethoprim  Home Medications   Current Outpatient Rx  Name Route Sig Dispense Refill  . ALPRAZOLAM 1 MG PO TABS Oral Take 1 mg by mouth 3 (three) times daily.     Marland Kitchen CARVEDILOL 25 MG PO TABS Oral Take 25 mg by mouth 2 (two) times daily with a meal.      . VITAMIN D 1000 UNITS PO TABS Oral Take 1,000 Units by mouth daily.    Marland Kitchen CLONIDINE HCL 0.2 MG PO TABS Oral Take 0.2 mg by mouth 3 (three) times daily.      . OMEGA-3 FATTY ACIDS 1000 MG PO CAPS Oral Take 1 g by mouth daily.    Marland Kitchen HYDROCODONE-ACETAMINOPHEN 10-325 MG PO TABS Oral Take 1 tablet by mouth every 8 (eight) hours as needed. pain    . LISINOPRIL-HYDROCHLOROTHIAZIDE 20-12.5 MG PO TABS Oral Take 1 tablet by mouth daily.      Marland Kitchen METFORMIN HCL 500 MG PO TABS Oral Take 500 mg by mouth 2 (two) times daily with a meal.      . SERTRALINE HCL 100 MG PO TABS Oral Take 100 mg by mouth daily.       There were no vitals taken for this visit.  Physical Exam  Nursing note  and vitals reviewed. Constitutional: She is oriented to person, place, and time. She appears well-developed and well-nourished. No distress.  HENT:  Head: Normocephalic and atraumatic.  Eyes:       Normal appearance  Neck: Normal range of motion.  Cardiovascular: Normal rate and regular rhythm.   Pulmonary/Chest: Effort normal and breath sounds normal.  Abdominal: Soft. Bowel sounds are normal. She exhibits no distension and no mass. There is no rebound and no guarding.       Mild suprapubic ttp  Genitourinary:       Tender external as well as internal hemorrhoids.  External hemorrhoid is non-thrombosed.  No gross blood in rectum.  Normal rectal tone.    Neurological: She is alert and oriented to person, place, and time.  Skin: Skin is warm and dry. No rash noted.  Psychiatric: She has a normal mood and affect. Her behavior is normal.    ED Course  Procedures (including critical care time)  Labs Reviewed  POCT I-STAT, CHEM 8 - Abnormal; Notable for the following:     Potassium 3.1 (*)    Glucose, Bld 105 (*)    All other components within normal limits  OCCULT BLOOD, POC DEVICE  URINALYSIS, ROUTINE W REFLEX MICROSCOPIC  OCCULT BLOOD X 1 CARD TO LAB, STOOL   No results found.   1. Hemorrhoid       MDM  Pt presents w/ c/o rectal bleeding.  Had blood on toilet paper when she wiped after urinating once today.  Has been constipated x 2 weeks wks, on miralax x 1 wk and loose stool both today and yesterday. Internal/external hemorrhoids on exam; most likely cause of bleeding.  Hemoccult neg and hgb w/in nml range.  Hypokalemia likely d/t use of miralax.  Will refer to GI for colonoscopy.  Recommended that she d/c miralax to avoid electrolyte abnormalities and dehydration and continue to apply hydrocortisone cream to anus.  She received 40mg  po potassium in ED. Pt also mentions unintentional 117lb weight loss over past 10 months.  Heavy smoker but denies cough/SOB.  Pt referred back to her PCP for further evaluation.  Return precautions discussed.         Otilio Miu, PA 02/19/11 1912  Otilio Miu, PA 02/19/11 [phone]

## 2011-02-19 NOTE — ED Notes (Signed)
Noticed dark cherry red blood on tissue after wiping from urination. No blood noted in commode

## 2011-02-19 NOTE — ED Provider Notes (Signed)
Medical screening examination/treatment/procedure(s) were performed by non-physician practitioner and as supervising physician I was immediately available for consultation/collaboration.   Billyjoe Go, MD 02/19/11 2333 

## 2011-03-04 ENCOUNTER — Encounter: Payer: Self-pay | Admitting: Internal Medicine

## 2011-03-04 ENCOUNTER — Ambulatory Visit (INDEPENDENT_AMBULATORY_CARE_PROVIDER_SITE_OTHER): Payer: Self-pay | Admitting: Internal Medicine

## 2011-03-04 DIAGNOSIS — Z950 Presence of cardiac pacemaker: Secondary | ICD-10-CM

## 2011-03-04 DIAGNOSIS — E785 Hyperlipidemia, unspecified: Secondary | ICD-10-CM

## 2011-03-04 DIAGNOSIS — I498 Other specified cardiac arrhythmias: Secondary | ICD-10-CM

## 2011-03-04 DIAGNOSIS — I10 Essential (primary) hypertension: Secondary | ICD-10-CM

## 2011-03-04 LAB — PACEMAKER DEVICE OBSERVATION
AL AMPLITUDE: 2.8 mv
AL IMPEDENCE PM: 600 Ohm
AL THRESHOLD: 1.125 V
ATRIAL PACING PM: 25
BAMS-0001: 175 {beats}/min
BATTERY VOLTAGE: 2.79 V
RV LEAD AMPLITUDE: 22.4 mv
RV LEAD IMPEDENCE PM: 633 Ohm
RV LEAD THRESHOLD: 1 V
VENTRICULAR PACING PM: 0

## 2011-03-04 NOTE — Assessment & Plan Note (Signed)
She is instructed to maintain a low-fat diet. She will continue her current medical therapy.

## 2011-03-04 NOTE — Assessment & Plan Note (Signed)
Her device is working normally. We'll plan to recheck in several months. 

## 2011-03-04 NOTE — Assessment & Plan Note (Signed)
Her blood pressure is fairly well controlled today. She'll continue her current medical therapy and maintain a low-sodium diet.

## 2011-03-04 NOTE — Patient Instructions (Signed)
Remote monitoring is used to monitor your Pacemaker of ICD from home. This monitoring reduces the number of office visits required to check your device to one time per year. It allows [place] to keep an eye on the functioning of your device to ensure it is working properly. You are scheduled for a device check from home on Jun 05, 2011. You may send your transmission at any time that day. If you have a wireless device, the transmission will be sent automatically. After your physician reviews your transmission, you will receive a postcard with your next transmission date.  Your physician wants you to follow-up in: 1 year with Dr Ladona Ridgel in [place].  You will receive a reminder letter in the mail two months in advance. If you don't receive a letter, please call our office to schedule the follow-up appointment.

## 2011-03-04 NOTE — Progress Notes (Signed)
HPI [name] returns today for followup. 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 She notes that she has lost weight despite an extensive evaluation. A colonoscopy is pending. Her appetite is good. She feels well. She denies peripheral edema. Allergies  Allergen Reactions  . Codeine Phosphate     REACTION: unspecified  . Naproxen     REACTION: vomiting  . Nsaids   . Penicillins   . Propoxyphene N-Acetaminophen   . Sulfamethoxazole W/Trimethoprim      Current Outpatient Prescriptions  Medication Sig Dispense Refill  . ALPRAZolam (XANAX) 1 MG tablet Take 1 mg by mouth 3 (three) times daily.       Marland Kitchen aspirin 81 MG tablet Take 81 mg by mouth daily.      . carvedilol (COREG) 25 MG tablet Take 25 mg by mouth 2 (two) times daily with a meal.        . cholecalciferol (VITAMIN D) 1000 UNITS tablet Take 1,000 Units by mouth daily.      . cloNIDine (CATAPRES) 0.2 MG tablet Take 0.2 mg by mouth 3 (three) times daily.        . fish oil-omega-3 fatty acids 1000 MG capsule Take 1 g by mouth daily.      Marland Kitchen HYDROcodone-acetaminophen (NORCO) 10-325 MG per tablet Take 1 tablet by mouth every 8 (eight) hours as needed. pain      . lisinopril-hydrochlorothiazide (PRINZIDE,ZESTORETIC) 20-12.5 MG per tablet Take 1 tablet by mouth daily.        . metFORMIN (GLUCOPHAGE) 500 MG tablet Take 500 mg by mouth 2 (two) times daily with a meal.        . sertraline (ZOLOFT) 100 MG tablet Take 100 mg by mouth daily.          Past Medical History  Diagnosis Date  . Anemia   . Hypertension   . Thyroid disease   . Diabetes mellitus   . Atrial fib/flutter, transient   . Depression     ROS:   All systems reviewed and negative except as noted in the HPI.   Past Surgical History  Procedure Date  . Vaginal hysterectomy   . Hernia repair   . Pacemaker insertion      Family History  Problem  Relation Age of Onset  . Colon cancer Father     prostate  . Hypertension Mother   . Heart attack Mother      History   Social History  . Marital Status: Married    Spouse Name: N/A    Number of Children: N/A  . Years of Education: N/A   Occupational History  . Not on file.   Social History Main Topics  . Smoking status: Current Some Day Smoker -- 0.5 packs/day for 30 years    Types: Cigarettes  . Smokeless tobacco: Not on file  . Alcohol Use: No  . Drug Use: No  . Sexually Active: Yes    Birth Control/ Protection: Surgical   Other Topics Concern  . Not on file   Social History Narrative  . No narrative on file     BP 132/88  Pulse 88  Ht 5' 4.5" (1.638 m)  Wt 81.194 kg (179 lb)  BMI 30.25 kg/m2  Physical Exam:  Well appearing middle-aged woman, NAD HEENT: Unremarkable Neck:  No JVD, no thyromegally Lungs:  Clear with no wheezes, rales, or rhonchi. HEART:  Regular rate  rhythm, no murmurs, no rubs, no clicks Abd:  soft, positive bowel sounds, no organomegally, no rebound, no guarding Ext:  2 plus pulses, no edema, no cyanosis, no clubbing Skin:  No rashes no nodules Neuro:  CN II through XII intact, motor grossly intact  DEVICE  Normal device function.  See PaceArt for details.   Assess/Plan:

## 2011-05-02 ENCOUNTER — Encounter: Payer: Self-pay | Admitting: Gastroenterology

## 2011-05-16 ENCOUNTER — Telehealth: Payer: Self-pay

## 2011-05-16 NOTE — Telephone Encounter (Signed)
**Note De-Identified [name] Obfuscation** Pt. walked into office with a device to check her Medtronic PPM at home. She states she does not know how to operate it and wants someone to call her with assistance. I attempted to help but was unable. Please call pt. At her home./LV

## 2011-05-23 NOTE — Telephone Encounter (Signed)
Left message for instructions for transmitting [facility] and also 800 # for tech support that will assist her in the transmission process.

## 2011-05-26 ENCOUNTER — Encounter: Payer: Self-pay | Admitting: Gastroenterology

## 2011-05-26 ENCOUNTER — Telehealth: Payer: Self-pay | Admitting: Gastroenterology

## 2011-05-26 ENCOUNTER — Other Ambulatory Visit (INDEPENDENT_AMBULATORY_CARE_PROVIDER_SITE_OTHER): Payer: Self-pay

## 2011-05-26 ENCOUNTER — Ambulatory Visit (INDEPENDENT_AMBULATORY_CARE_PROVIDER_SITE_OTHER): Payer: Self-pay | Admitting: Gastroenterology

## 2011-05-26 VITALS — BP 114/72 | HR 68 | Ht 65.5 in | Wt 171.4 lb

## 2011-05-26 DIAGNOSIS — K5900 Constipation, unspecified: Secondary | ICD-10-CM

## 2011-05-26 DIAGNOSIS — R1033 Periumbilical pain: Secondary | ICD-10-CM

## 2011-05-26 DIAGNOSIS — K921 Melena: Secondary | ICD-10-CM

## 2011-05-26 DIAGNOSIS — R634 Abnormal weight loss: Secondary | ICD-10-CM

## 2011-05-26 LAB — BASIC METABOLIC PANEL
BUN: 21 mg/dL (ref 6–23)
CO2: 31 mEq/L (ref 19–32)
Calcium: 9.5 mg/dL (ref 8.4–10.5)
Chloride: 101 mEq/L (ref 96–112)
Creatinine, Ser: 1.1 mg/dL (ref 0.4–1.2)
GFR: 65.55 mL/min (ref >60.00)
Glucose, Bld: 92 mg/dL (ref 70–99)
Potassium: 4.2 mEq/L (ref 3.5–5.1)
Sodium: 140 mEq/L (ref 135–145)

## 2011-05-26 NOTE — Patient Instructions (Addendum)
You have been given a separate informational sheet regarding your tobacco use, the importance of quitting and local resources to help you quit.  Your physician has requested that you go to the basement for the following lab work before leaving today:Bmet  You have been scheduled for a CT scan of the abdomen and pelvis at [facility] CT ([address]---this is in the same building as Architectural technologist).   You are scheduled on 05/29/11 at 1:00pm. You should arrive 15 minutes prior to your appointment time for registration. Please follow the written instructions below on the day of your exam:  WARNING: IF YOU ARE ALLERGIC TO IODINE/X-RAY DYE, PLEASE NOTIFY RADIOLOGY IMMEDIATELY AT [phone]! YOU WILL BE GIVEN A 13 HOUR PREMEDICATION PREP.  1) Do not eat or drink anything after 9:00am (4 hours prior to your test) 2) You have been given 2 bottles of oral contrast to drink. The solution may taste better if refrigerated, but do NOT add ice or any other liquid to this solution. Shake well before drinking.    Drink 1 bottle of contrast @ 11:00am (2 hours prior to your exam)  Drink 1 bottle of contrast @ 12:00pm (1 hour prior to your exam)  You may take any medications as prescribed with a small amount of water except for the following: Metformin, Glucophage, Glucovance, Avandamet, Riomet, Fortamet, Actoplus Met, Janumet, Glumetza or Metaglip. The above medications must be held the day of the exam AND 48 hours after the exam.  The purpose of you drinking the oral contrast is to aid in the visualization of your intestinal tract. The contrast solution may cause some diarrhea. Before your exam is started, you will be given a small amount of fluid to drink. Depending on your individual set of symptoms, you may also receive an intravenous injection of x-ray contrast/dye. Plan on being at [facility] for 30 minutes or long, depending on the type of exam you are having performed.  If you  have any questions regarding your exam or if you need to reschedule, you may call the CT department at [phone] between the hours of 8:00 am and 5:00 pm, Monday-Friday.  ________________________________________________________________________    [name] have been scheduled for a colonoscopy with propofol. Please follow written instructions given to you at your visit today.  Please pick up your prep kit at the pharmacy within the next 1-3 days.  cc: [name], MD

## 2011-05-26 NOTE — Telephone Encounter (Signed)
Patient states that she has already spoke with someone from our office and all her questions have been answered   She will call back for any additional questions

## 2011-05-26 NOTE — Progress Notes (Signed)
History of Present Illness: This is a 61 year old female with a lifelong history of constipation. She takes MiraLax, suppositories and Colace as needed. She relates a 120 pound weight loss over the past year with a decreased appetite. She states she's been under more stress taking care of her husband with chronic medical problems. She has not been sleeping well. She occasionally has periumbilical abdominal pain associated with gas and bloating. She had one episode of bright red blood per rectum associated with constipation and presented to the emergency department for evaluation revealed hemorrhoids with Hemoccult negative stool. The EPIC records from her emergency department evaluation were reviewed. Denies diarrhea, change in stool caliber, melena, hematochezia, dysphagia, reflux symptoms, chest pain.  Allergies  Allergen Reactions  . Codeine Phosphate     REACTION: unspecified  . Morphine And Related   . Naproxen     REACTION: vomiting  . Nsaids   . Penicillins   . Propoxyphene-Acetaminophen   . Sulfamethoxazole W-Trimethoprim    Outpatient Prescriptions Prior to Visit  Medication Sig Dispense Refill  . ALPRAZolam (XANAX) 1 MG tablet Take 1 mg by mouth 3 (three) times daily.       Marland Kitchen aspirin 81 MG tablet Take 81 mg by mouth daily.      . carvedilol (COREG) 25 MG tablet Take 25 mg by mouth 2 (two) times daily with a meal.        . cholecalciferol (VITAMIN D) 1000 UNITS tablet Take 1,000 Units by mouth daily.      . cloNIDine (CATAPRES) 0.2 MG tablet Take 0.2 mg by mouth 3 (three) times daily.        . fish oil-omega-3 fatty acids 1000 MG capsule Take 1 g by mouth daily.      Marland Kitchen HYDROcodone-acetaminophen (NORCO) 10-325 MG per tablet Take 1 tablet by mouth every 8 (eight) hours as needed. pain      . lisinopril-hydrochlorothiazide (PRINZIDE,ZESTORETIC) 20-12.5 MG per tablet Take 1 tablet by mouth daily.        . metFORMIN (GLUCOPHAGE) 500 MG tablet Take 500 mg by mouth 2 (two) times daily with  a meal.        . sertraline (ZOLOFT) 100 MG tablet Take 100 mg by mouth daily.        Past Medical History  Diagnosis Date  . Anemia   . Hypertension   . Thyroid disease   . Diabetes mellitus   . Atrial fib/flutter, transient   . Depression   . GERD (gastroesophageal reflux disease)   . Gout   . DJD (degenerative joint disease)   . Diverticulosis    Past Surgical History  Procedure Date  . Vaginal hysterectomy   . Hernia repair   . Pacemaker insertion    History   Social History  . Marital Status: Married    Spouse Name: N/A    Number of Children: 2  . Years of Education: N/A   Occupational History  . Disabled    Social History Main Topics  . Smoking status: Current Some Day Smoker -- 0.5 packs/day for 30 years    Types: Cigarettes  . Smokeless tobacco: Never Used  . Alcohol Use: No  . Drug Use: No  . Sexually Active: Yes    Birth Control/ Protection: Surgical   Other Topics Concern  . None   Social History Narrative  . None   Family History  Problem Relation Age of Onset  . Prostate cancer Father   . Hypertension Mother   .  Heart attack Mother   . Heart disease Mother   . Stomach cancer Son   . Diabetes Maternal Aunt   . Heart disease Maternal Uncle     Review of Systems: Pertinent positive and negative review of systems were noted in the above HPI section. All other review of systems were otherwise negative.  Physical Exam: General: Well developed , well nourished, no acute distress Head: Normocephalic and atraumatic Eyes:  sclerae anicteric, EOMI Ears: Normal auditory acuity Mouth: No deformity or lesions Neck: Supple, no masses or thyromegaly Lungs: Clear throughout to auscultation Heart: Regular rate and rhythm; no murmurs, rubs or bruits Abdomen: Soft, tenderness just below umbilicus at prior incision sites without rebound or guarding and non distended. No masses, hepatosplenomegaly or hernias noted. Normal Bowel sounds Rectal: Deferred to  colonoscopy Musculoskeletal: Symmetrical with no gross deformities  Skin: No lesions on visible extremities Pulses:  Normal pulses noted Extremities: No clubbing, cyanosis, edema or deformities noted Neurological: Alert oriented x 4, grossly nonfocal Cervical Nodes:  No significant cervical adenopathy Inguinal Nodes: No significant inguinal adenopathy Psychological:  Alert and cooperative. Normal mood and affect  Assessment and Recommendations:  1. Weight loss. Etiology unclear but may be related to diabetes and decreased oral intake associated with decreased appetite. Schedule abdominal/pelvic CT and colonoscopy.  2. Chronic constipation. Maintain a regular regimen of Colace and MiraLax titrated for adequate bowel movements.  3. Periumbilical abdominal pain. Above regimen for constipation. Gas-X 4 times a day when necessary. Further evaluation with abdominal/pelvic CT scan and colonoscopy.

## 2011-05-29 ENCOUNTER — Ambulatory Visit (INDEPENDENT_AMBULATORY_CARE_PROVIDER_SITE_OTHER)
Admission: RE | Admit: 2011-05-29 | Discharge: 2011-05-29 | Disposition: A | Discharge status: Home / Self Care | Payer: Self-pay | Source: Ambulatory Visit | Attending: Gastroenterology | Admitting: Gastroenterology

## 2011-05-29 DIAGNOSIS — R1033 Periumbilical pain: Secondary | ICD-10-CM

## 2011-05-29 DIAGNOSIS — R634 Abnormal weight loss: Secondary | ICD-10-CM

## 2011-05-29 MED ORDER — IOHEXOL 300 MG/ML  SOLN
100.0000 mL | Freq: Once | INTRAMUSCULAR | Status: AC | PRN
  Administered 2011-05-29: 100 mL via INTRAVENOUS

## 2011-06-02 ENCOUNTER — Telehealth: Payer: Self-pay | Admitting: *Deleted

## 2011-06-02 NOTE — Telephone Encounter (Signed)
Calling to see if patient is willing/able to move to an earlier available appointment time.  Left message on answering machine for patient to call back.

## 2011-06-03 ENCOUNTER — Encounter: Payer: Self-pay | Admitting: Gastroenterology

## 2011-06-03 ENCOUNTER — Ambulatory Visit (AMBULATORY_SURGERY_CENTER): Payer: Self-pay | Admitting: Gastroenterology

## 2011-06-03 VITALS — BP 141/102 | HR 89 | Temp 97.8°F | Resp 18 | Ht 65.5 in | Wt 171.0 lb

## 2011-06-03 DIAGNOSIS — D126 Benign neoplasm of colon, unspecified: Secondary | ICD-10-CM

## 2011-06-03 DIAGNOSIS — K5900 Constipation, unspecified: Secondary | ICD-10-CM

## 2011-06-03 DIAGNOSIS — K635 Polyp of colon: Secondary | ICD-10-CM

## 2011-06-03 DIAGNOSIS — R1033 Periumbilical pain: Secondary | ICD-10-CM

## 2011-06-03 DIAGNOSIS — K921 Melena: Secondary | ICD-10-CM

## 2011-06-03 DIAGNOSIS — R634 Abnormal weight loss: Secondary | ICD-10-CM

## 2011-06-03 LAB — GLUCOSE, CAPILLARY
Glucose-Capillary: 102 mg/dL — ABNORMAL HIGH (ref 70–99)
Glucose-Capillary: 112 mg/dL — ABNORMAL HIGH (ref 70–99)

## 2011-06-03 MED ORDER — SODIUM CHLORIDE 0.9 % IV SOLN: 500.0000 mL | INTRAVENOUS | Status: DC

## 2011-06-03 NOTE — Op Note (Signed)
[facility] [address] [place], [place]  [address]  COLONOSCOPY PROCEDURE REPORT  PATIENT:  [name], [name]  MR#:  [id number] BIRTHDATE:  March 30, 1950, 60 yrs. old  GENDER:  female ENDOSCOPIST:  Judie Petit T. Russella Dar, MD, [facility] Referred by:  [name], M.D. PROCEDURE DATE:  06/03/2011 PROCEDURE:  Colonoscopy with snare polypectomy ASA CLASS:  Class II INDICATIONS:  1) hematochezia  2) weight loss  3) abdominal pain 4) constipation MEDICATIONS:   MAC sedation, administered by CRNA, propofol (Diprivan) 300 mg IV DESCRIPTION OF PROCEDURE:   After the risks benefits and alternatives of the procedure were thoroughly explained, informed consent was obtained.  Digital rectal exam was performed and revealed no abnormalities.   The LB [id number] endoscope was introduced through the anus and advanced to the cecum, which was identified by both the appendix and ileocecal valve, with a tortuous colon.  The quality of the prep was good, using MoviPrep. The instrument was then slowly withdrawn as the colon was fully examined. <<PROCEDUREIMAGES>> FINDINGS:  Four polyps were found in the sigmoid colon. They were 4 - 5 mm in size. Polyps were snared without cautery. Retrieval was successful. Otherwise normal colonoscopy without other polyps, masses, vascular ectasias, or inflammatory changes.   Retroflexed views in the rectum revealed internal hemorrhoids, moderate.  The time to cecum =  5  minutes. The scope was then withdrawn (time = 12.25  min) from the patient and the procedure completed.  COMPLICATIONS:  None  ENDOSCOPIC IMPRESSION: 1) 4 - 5 mm Four polyps in the sigmoid colon 2) Internal hemorrhoids  RECOMMENDATIONS: 1) Await pathology results 2) If the polyps are adenomatous (pre-cancerous), repeat colonoscopy in 5 years. Otherwise follow colorectal cancer screening guidelines for "routine risk" patients with colonoscopy in 10 years. 3) Laxatives for management of  constipation 4) Follow up with your PCP for ongoing medical care  Arnella Pralle T. Russella Dar, MD, [facility]  n. eSIGNED:   Venita Lick. Racquel Arkin at 06/03/2011 04:18 PM  Lujean Amel, [phone]

## 2011-06-03 NOTE — Patient Instructions (Signed)
YOU HAD AN ENDOSCOPIC PROCEDURE TODAY AT THE [facility]: Refer to the procedure report that was given to you for any specific questions about what was found during the examination.  If the procedure report does not answer your questions, please call your gastroenterologist to clarify.  If you requested that your care partner not be given the details of your procedure findings, then the procedure report has been included in a sealed envelope for you to review at your convenience later.  YOU SHOULD EXPECT: Some feelings of bloating in the abdomen. Passage of more gas than usual.  Walking can help get rid of the air that was put into your GI tract during the procedure and reduce the bloating. If you had a lower endoscopy (such as a colonoscopy or flexible sigmoidoscopy) you may notice spotting of blood in your stool or on the toilet paper. If you underwent a bowel prep for your procedure, then you may not have a normal bowel movement for a few days.  DIET: Your first meal following the procedure should be a light meal and then it is ok to progress to your normal diet.  A half-sandwich or bowl of soup is an example of a good first meal.  Heavy or fried foods are harder to digest and may make you feel nauseous or bloated.  Likewise meals heavy in dairy and vegetables can cause extra gas to form and this can also increase the bloating.  Drink plenty of fluids but you should avoid alcoholic beverages for 24 hours.  ACTIVITY: Your care partner should take you home directly after the procedure.  You should plan to take it easy, moving slowly for the rest of the day.  You can resume normal activity the day after the procedure however you should NOT DRIVE or use heavy machinery for 24 hours (because of the sedation medicines used during the test).     AWAIT PATHOLOGY RESULTS.  USE YOUR LAXATIVES FOR MANAGEMENT OF CONSTIPATION.  FOLLOW UP WITH YOUR PCP FOR ONGOING MEDICAL CARE.  SYMPTOMS TO REPORT  IMMEDIATELY: A gastroenterologist can be reached at any hour.  During normal business hours, 8:30 AM to 5:00 PM Monday through Friday, call [phone].  After hours and on weekends, please call the GI answering service at [phone] who will take a message and have the physician on call contact you.   Following lower endoscopy (colonoscopy or flexible sigmoidoscopy):  Excessive amounts of blood in the stool  Significant tenderness or worsening of abdominal pains  Swelling of the abdomen that is new, acute  Fever of 100F or higher  Following upper endoscopy (EGD)  Vomiting of blood or coffee ground material  New chest pain or pain under the shoulder blades  Painful or persistently difficult swallowing  New shortness of breath  Fever of 100F or higher  Black, tarry-looking stools  FOLLOW UP: If any biopsies were taken you will be contacted by phone or by letter within the next 1-3 weeks.  Call your gastroenterologist if you have not heard about the biopsies in 3 weeks.  Our staff will call the home number listed on your records the next business day following your procedure to check on you and address any questions or concerns that you may have at that time regarding the information given to you following your procedure. This is a courtesy call and so if there is no answer at the home number and we have not heard from you through the emergency  physician on call, we will assume that you have returned to your regular daily activities without incident.  SIGNATURES/CONFIDENTIALITY: You and/or your care partner have signed paperwork which will be entered into your electronic medical record.  These signatures attest to the fact that that the information above on your After Visit Summary has been reviewed and is understood.  Full responsibility of the confidentiality of this discharge information lies with you and/or your care-partner.

## 2011-06-03 NOTE — Progress Notes (Signed)
Patient did not experience any of the following events: a burn prior to discharge; a fall within the facility; wrong site/side/patient/procedure/implant event; or a hospital transfer or hospital admission upon discharge from the facility. (G8907) Patient did not have preoperative order for IV antibiotic SSI prophylaxis. (G8918)  

## 2011-06-04 ENCOUNTER — Telehealth: Payer: Self-pay | Admitting: *Deleted

## 2011-06-04 NOTE — Telephone Encounter (Signed)
  Follow up Call-  Call back number 06/03/2011  Post procedure Call Back phone  # [phone]  Permission to leave phone message Yes     Patient questions:  Do you have a fever, pain , or abdominal swelling? no Pain Score  0 *  Have you tolerated food without any problems? yes  Have you been able to return to your normal activities? yes  Do you have any questions about your discharge instructions: Diet   no Medications  no Follow up visit  no  Do you have questions or concerns about your Care? no  Actions: * If pain score is 4 or above: No action needed, pain <4.

## 2011-06-05 ENCOUNTER — Encounter: Payer: Self-pay | Admitting: Internal Medicine

## 2011-06-05 ENCOUNTER — Telehealth: Payer: Self-pay | Admitting: Cardiology

## 2011-06-05 ENCOUNTER — Telehealth: Payer: Self-pay | Admitting: Gastroenterology

## 2011-06-05 ENCOUNTER — Ambulatory Visit (INDEPENDENT_AMBULATORY_CARE_PROVIDER_SITE_OTHER): Payer: Self-pay | Admitting: *Deleted

## 2011-06-05 DIAGNOSIS — Z950 Presence of cardiac pacemaker: Secondary | ICD-10-CM

## 2011-06-05 DIAGNOSIS — R001 Bradycardia, unspecified: Secondary | ICD-10-CM

## 2011-06-05 DIAGNOSIS — I498 Other specified cardiac arrhythmias: Secondary | ICD-10-CM

## 2011-06-05 NOTE — Telephone Encounter (Signed)
Patient had a pacemaker check today with Dr Ladona Ridgel and has been not feeling well since.  She reports that she hs checked her BP several times today and it is low .  She is advised to notify Dr Ladona Ridgel or her primary care that prescribes her BP meds.  She verbalized understanding.

## 2011-06-05 NOTE — Telephone Encounter (Signed)
Returned a call to Ms. Fear who was calling concerning her BP. She stated it normally runs "high", but today it was in the 90s and is currently 106/76. (It was noted to be 114/72 on 05/26/11). She was only taking clear liquids over the weekend and then was NPO for a colonoscopy on Tuesday. Started eating and drinking again yesterday. Today felt dizzy and lightheaded and checked bp and found it to be in the 90s. She stated her blood sugar was 119 around lunch time and 120 now. She denies signs of bleeding, chest pain, sob, palpitations, or syncope. She wasn't sure what to do about her BP meds (Clonidine and Coreg). I told her it was ok to hold them tonight if she continued to feel dizzy/lightheaded and to check her BP in the morning. If it remained low and she felt poorly she is to call the office. Otherwise she will resume taking her medications normally. I also told her to increase oral intake.  Shenequa Howse PA-C 06/05/2011 7:18 PM

## 2011-06-07 ENCOUNTER — Telehealth: Payer: Self-pay | Admitting: Cardiology

## 2011-06-07 NOTE — Telephone Encounter (Signed)
Returned call to Ms. Pancoast regarding her blood pressure. See telephone note from 06/05/11. She stated her BP was "ok" on Friday and she felt well, but today had some lightheadedness and her BP was 97/?Marland Kitchen She was without other complaints other than constipation. She denied syncope or bleeding. Her PCP manages her antihypertensive regimen so I told her to decrease her Coreg dose to 12.5mg  BID over the weekend and call her PCP office first thing Monday morning to discuss BP med regimen. She stated understanding.  Eaven Schwager PA-C 06/07/2011 12:07 PM

## 2011-06-09 ENCOUNTER — Encounter: Payer: Self-pay | Admitting: Gastroenterology

## 2011-06-09 LAB — REMOTE PACEMAKER DEVICE
AL AMPLITUDE: 2.8 mv
AL IMPEDENCE PM: 600 Ohm
AL THRESHOLD: 1.25 V
ATRIAL PACING PM: 30
BAMS-0001: 175 {beats}/min
BATTERY VOLTAGE: 2.79 V
RV LEAD AMPLITUDE: 22.4 mv
RV LEAD IMPEDENCE PM: 665 Ohm
RV LEAD THRESHOLD: 1 V
VENTRICULAR PACING PM: 0

## 2011-06-23 ENCOUNTER — Encounter: Payer: Self-pay | Admitting: *Deleted

## 2011-07-09 ENCOUNTER — Other Ambulatory Visit: Payer: Self-pay | Admitting: Internal Medicine

## 2011-07-09 DIAGNOSIS — N63 Unspecified lump in unspecified breast: Secondary | ICD-10-CM

## 2011-07-16 ENCOUNTER — Ambulatory Visit
Admission: RE | Admit: 2011-07-16 | Discharge: 2011-07-16 | Disposition: A | Discharge status: Home / Self Care | Payer: No Typology Code available for payment source | Source: Ambulatory Visit | Attending: Internal Medicine | Admitting: Internal Medicine

## 2011-07-16 DIAGNOSIS — N63 Unspecified lump in unspecified breast: Secondary | ICD-10-CM

## 2011-07-25 ENCOUNTER — Emergency Department (HOSPITAL_COMMUNITY)
Admission: EM | Admit: 2011-07-25 | Discharge: 2011-07-26 | Disposition: A | Discharge status: Home / Self Care | Payer: Medicaid Other | Attending: Emergency Medicine | Admitting: Emergency Medicine

## 2011-07-25 ENCOUNTER — Encounter (HOSPITAL_COMMUNITY): Payer: Self-pay | Admitting: *Deleted

## 2011-07-25 DIAGNOSIS — K219 Gastro-esophageal reflux disease without esophagitis: Secondary | ICD-10-CM | POA: Insufficient documentation

## 2011-07-25 DIAGNOSIS — M7989 Other specified soft tissue disorders: Secondary | ICD-10-CM | POA: Insufficient documentation

## 2011-07-25 DIAGNOSIS — E119 Type 2 diabetes mellitus without complications: Secondary | ICD-10-CM | POA: Insufficient documentation

## 2011-07-25 DIAGNOSIS — Z800 Family history of malignant neoplasm of digestive organs: Secondary | ICD-10-CM | POA: Insufficient documentation

## 2011-07-25 DIAGNOSIS — F329 Major depressive disorder, single episode, unspecified: Secondary | ICD-10-CM | POA: Insufficient documentation

## 2011-07-25 DIAGNOSIS — Z8042 Family history of malignant neoplasm of prostate: Secondary | ICD-10-CM | POA: Insufficient documentation

## 2011-07-25 DIAGNOSIS — E079 Disorder of thyroid, unspecified: Secondary | ICD-10-CM | POA: Insufficient documentation

## 2011-07-25 DIAGNOSIS — M1990 Unspecified osteoarthritis, unspecified site: Secondary | ICD-10-CM | POA: Insufficient documentation

## 2011-07-25 DIAGNOSIS — Z90710 Acquired absence of both cervix and uterus: Secondary | ICD-10-CM | POA: Insufficient documentation

## 2011-07-25 DIAGNOSIS — F3289 Other specified depressive episodes: Secondary | ICD-10-CM | POA: Insufficient documentation

## 2011-07-25 DIAGNOSIS — I4891 Unspecified atrial fibrillation: Secondary | ICD-10-CM | POA: Insufficient documentation

## 2011-07-25 DIAGNOSIS — F17200 Nicotine dependence, unspecified, uncomplicated: Secondary | ICD-10-CM | POA: Insufficient documentation

## 2011-07-25 DIAGNOSIS — Z833 Family history of diabetes mellitus: Secondary | ICD-10-CM | POA: Insufficient documentation

## 2011-07-25 DIAGNOSIS — M109 Gout, unspecified: Secondary | ICD-10-CM | POA: Insufficient documentation

## 2011-07-25 DIAGNOSIS — I10 Essential (primary) hypertension: Secondary | ICD-10-CM | POA: Insufficient documentation

## 2011-07-25 DIAGNOSIS — Z8249 Family history of ischemic heart disease and other diseases of the circulatory system: Secondary | ICD-10-CM | POA: Insufficient documentation

## 2011-07-25 NOTE — ED Notes (Addendum)
Bilateral lower extremity swelling that started today, pt states that she does have a "knot" on right lower leg, denies any injury, pt denies any pain, sob pt states that she was seen in dr office this past Wednesday, given Fluconazole for infection.

## 2011-07-26 MED ORDER — FUROSEMIDE 40 MG PO TABS
20.0000 mg | ORAL_TABLET | Freq: Once | ORAL | Status: AC
  Administered 2011-07-26: 20 mg via ORAL
  Filled 2011-07-26: qty 1

## 2011-07-26 NOTE — ED Provider Notes (Signed)
History     CSN: [id number]  Arrival date & time 07/25/11  2225   First MD Initiated Contact with Patient 07/25/11 2307      Chief Complaint  Patient presents with  . Leg Swelling    (Consider location/radiation/quality/duration/timing/severity/associated sxs/prior treatment) HPI  [name] is a 61 y.o. female who presents to the Emergency Department complaining of swelling to both feet that began today. She has kept her feet elevated the majority of the day, with no real improvement. She has had only one medication change, the addition of fluconizole for 5 days for intertrigo.She denies chest pain, shortness of breath, cough, nausea, vomiting.  PCP Dr. Roseanne Reno  Past Medical History  Diagnosis Date  . Anemia   . Hypertension   . Thyroid disease   . Diabetes mellitus   . Atrial fib/flutter, transient   . Depression   . GERD (gastroesophageal reflux disease)   . Gout   . DJD (degenerative joint disease)   . Diverticulosis     Past Surgical History  Procedure Date  . Vaginal hysterectomy   . Hernia repair   . Pacemaker insertion   . Tubal ligation     Family History  Problem Relation Age of Onset  . Prostate cancer Father   . Hypertension Mother   . Heart attack Mother   . Heart disease Mother   . Stomach cancer Son   . Diabetes Maternal Aunt   . Heart disease Maternal Uncle   . Colon cancer Neg Hx   . Esophageal cancer Neg Hx   . Rectal cancer Neg Hx     History  Substance Use Topics  . Smoking status: Current Some Day Smoker -- 0.5 packs/day for 30 years    Types: Cigarettes  . Smokeless tobacco: Never Used  . Alcohol Use: No    OB History    Grav Para Term Preterm Abortions TAB SAB Ect Mult Living   3    1 1    2       Review of Systems  Constitutional: Negative for fever.       10 Systems reviewed and are negative for acute change except as noted in the HPI.  HENT: Negative for congestion.   Eyes: Negative for discharge and redness.    Respiratory: Negative for cough and shortness of breath.   Cardiovascular: Positive for leg swelling. Negative for chest pain.  Gastrointestinal: Negative for vomiting and abdominal pain.  Musculoskeletal: Negative for back pain.  Skin: Negative for rash.  Neurological: Negative for syncope, numbness and headaches.  Psychiatric/Behavioral:       No behavior change.    Allergies  Codeine phosphate; Morphine and related; Naproxen; Nsaids; Penicillins; Propoxyphene-acetaminophen; and Sulfamethoxazole w-trimethoprim  Home Medications   Current Outpatient Rx  Name Route Sig Dispense Refill  . ALPRAZOLAM 1 MG PO TABS Oral Take 1 mg by mouth 3 (three) times daily.     Marland Kitchen AMLODIPINE BESYLATE 10 MG PO TABS Oral Take 10 mg by mouth daily.    . ASPIRIN 81 MG PO TABS Oral Take 81 mg by mouth daily.    Marland Kitchen CARVEDILOL 25 MG PO TABS Oral Take 25 mg by mouth 2 (two) times daily with a meal.      . VITAMIN D 1000 UNITS PO TABS Oral Take 1,000 Units by mouth daily.    Marland Kitchen CLONIDINE HCL 0.2 MG PO TABS Oral Take 0.2 mg by mouth 3 (three) times daily.      Marland Kitchen  OMEGA-3 FATTY ACIDS 1000 MG PO CAPS Oral Take 1 g by mouth daily.    Marland Kitchen FLUCONAZOLE 100 MG PO TABS Oral Take 100 mg by mouth 2 (two) times daily. **Take one tablet by mouth twice daily for 5 days. If no relief, continue regimen for 5 more days**    . HYDROCODONE-ACETAMINOPHEN 10-325 MG PO TABS Oral Take 1 tablet by mouth every 8 (eight) hours as needed. pain    . LISINOPRIL-HYDROCHLOROTHIAZIDE 20-12.5 MG PO TABS Oral Take 1 tablet by mouth daily.      Marland Kitchen METFORMIN HCL 500 MG PO TABS Oral Take 500 mg by mouth 2 (two) times daily with a meal.      . ONE-DAILY MULTI VITAMINS PO TABS Oral Take 1 tablet by mouth daily.    . SERTRALINE HCL 100 MG PO TABS Oral Take 100 mg by mouth daily.       BP 139/83  Pulse 68  Temp 98.1 F (36.7 C)  Resp 20  Ht 5' 5.5" (1.664 m)  Wt 170 lb 3 oz (77.197 kg)  BMI 27.89 kg/m2  SpO2 100%  Physical Exam  Nursing note  and vitals reviewed. Constitutional: She is oriented to person, place, and time. She appears well-developed and well-nourished.       Awake, alert, nontoxic appearance.  HENT:  Head: Normocephalic and atraumatic.  Eyes: Conjunctivae and EOM are normal. Pupils are equal, round, and reactive to light. Right eye exhibits no discharge. Left eye exhibits no discharge.  Neck: Neck supple.  Cardiovascular: Normal rate.   Murmur heard. Pulmonary/Chest: Effort normal and breath sounds normal. She exhibits no tenderness.  Abdominal: Soft. There is no tenderness. There is no rebound.  Musculoskeletal: She exhibits no tenderness.       Baseline ROM, no obvious new focal weakness.  Neurological: She is alert and oriented to person, place, and time.       Mental status and motor strength appears baseline for patient and situation.  Skin: No rash noted.  Psychiatric: She has a normal mood and affect.    ED Course  Procedures (including critical care time)     MDM  Patient with foot swelling x 24 hours. Given a single dose PO diuretic as her request. She has one day left of fluconazole. She will follow up with her PCP. Pt stable in ED with no significant deterioration in condition.The patient appears reasonably screened and/or stabilized for discharge and I doubt any other medical condition or other [facility] requiring further screening, evaluation, or treatment in the ED at this time prior to discharge.  MDM Number of Diagnoses or Management Options  MDM Reviewed: nursing note and vitals           Nicoletta Dress. Colon Branch, MD 07/26/11 [phone]

## 2011-09-11 ENCOUNTER — Encounter: Payer: Self-pay | Admitting: Internal Medicine

## 2011-09-11 ENCOUNTER — Ambulatory Visit (INDEPENDENT_AMBULATORY_CARE_PROVIDER_SITE_OTHER): Payer: Self-pay | Admitting: *Deleted

## 2011-09-11 DIAGNOSIS — I498 Other specified cardiac arrhythmias: Secondary | ICD-10-CM

## 2011-09-12 LAB — REMOTE PACEMAKER DEVICE
AL AMPLITUDE: 2.8 mv
AL IMPEDENCE PM: 581 Ohm
AL THRESHOLD: 1.125 V
ATRIAL PACING PM: 32
BAMS-0001: 175 {beats}/min
BATTERY VOLTAGE: 2.8 V
RV LEAD AMPLITUDE: 22.4 mv
RV LEAD IMPEDENCE PM: 626 Ohm
RV LEAD THRESHOLD: 1 V
VENTRICULAR PACING PM: 0

## 2011-09-15 ENCOUNTER — Encounter: Payer: Self-pay | Admitting: *Deleted

## 2011-09-23 ENCOUNTER — Other Ambulatory Visit: Payer: Self-pay | Admitting: Internal Medicine

## 2011-11-17 ENCOUNTER — Other Ambulatory Visit: Payer: Self-pay | Admitting: Internal Medicine

## 2011-12-01 ENCOUNTER — Encounter: Payer: Self-pay | Admitting: Gastroenterology

## 2011-12-15 ENCOUNTER — Encounter: Payer: Self-pay | Admitting: *Deleted

## 2011-12-23 ENCOUNTER — Encounter: Payer: Self-pay | Admitting: *Deleted

## 2011-12-24 ENCOUNTER — Encounter: Payer: Self-pay | Admitting: Gastroenterology

## 2011-12-24 ENCOUNTER — Ambulatory Visit (INDEPENDENT_AMBULATORY_CARE_PROVIDER_SITE_OTHER): Payer: Medicaid Other | Admitting: Gastroenterology

## 2011-12-24 ENCOUNTER — Telehealth: Payer: Self-pay | Admitting: Internal Medicine

## 2011-12-24 VITALS — BP 130/74 | HR 80 | Ht 64.5 in | Wt 165.2 lb

## 2011-12-24 DIAGNOSIS — R1013 Epigastric pain: Secondary | ICD-10-CM

## 2011-12-24 DIAGNOSIS — K5900 Constipation, unspecified: Secondary | ICD-10-CM

## 2011-12-24 NOTE — Progress Notes (Signed)
History of Present Illness: This is a 61 year old female with chronic constipation, gas and internal hemorrhoids. She underwent colonoscopy and abd/pelvic CT in May 2013 . She has upper abdominal discomfort, gas and bloating. She has incomplete evacuation of stool. Takes Miralax qd prn. Denies weight loss, diarrhea, change in stool caliber, melena, hematochezia, nausea, vomiting, dysphagia, reflux symptoms, chest pain.   Review of Systems: Pertinent positive and negative review of systems were noted in the above HPI section. All other review of systems were otherwise negative.  Current Medications, Allergies, Past Medical History, Past Surgical History, Family History and Social History were reviewed in [employer] record.  Physical Exam: General: Well developed , well nourished, no acute distress Head: Normocephalic and atraumatic Eyes:  sclerae anicteric, EOMI Ears: Normal auditory acuity Mouth: No deformity or lesions Neck: Supple, no masses or thyromegaly Lungs: Clear throughout to auscultation Heart: Regular rate and rhythm; no murmurs, rubs or bruits Abdomen: Soft, non tender and non distended. No masses, hepatosplenomegaly or hernias noted. Normal Bowel sounds Rectal: deferred-had colonoscopy in May 2013 Musculoskeletal: Symmetrical with no gross deformities  Skin: No lesions on visible extremities Pulses:  Normal pulses noted Extremities: No clubbing, cyanosis, edema or deformities noted Neurological: Alert oriented x 4, grossly nonfocal Cervical Nodes:  No significant cervical adenopathy Inguinal Nodes: No significant inguinal adenopathy Psychological:  Alert and cooperative. Normal mood and affect  Assessment and Recommendations:  1.  Chronic constipation with associated gas and abdominal discomfort. Increase Miralax to bid or tid on a regular basis. If symptoms persist will consider further evaluation. Oxycodone is likely exacerbating her constipation.  PCP to evaluate for alternative medications.  2.  Internal hemorrhoids. Prep H supp qd prn.

## 2011-12-24 NOTE — Patient Instructions (Addendum)
You have been given a separate informational sheet regarding your tobacco use, the importance of quitting and local resources to help you quit.  Increase your Miralax to 2-3 x daily for adequate bowel movements.  Follow up with your Primary Care Physician.  cc: [name], MD

## 2011-12-24 NOTE — Telephone Encounter (Signed)
Patient called and states she went to the GI doctor and the nurse told her her BP in the left arm was 96/70 and in the right arm 134/80.  Patient was told she should see her cardiologist, Dr. Ladona Ridgel.  She recently has moved to the [place] area and his first appointment available was 02/06, I scheduled this appointment for her and advised I would send a note to the nurse to make sure she would not need an earlier appointment.  The patient also has missed her remote pacemaker check.  She has never been to [place] and stated she could go to [facility] if needs to be seen.  If we can overbook, Dr. Ladona Ridgel is here on 12/09, if she needs an earlier appointment.  Also, her Remote pacemaker check needs to be rescheduled.  Her "recall" appointment with Dr. Ladona Ridgel is for February.  Please advise.

## 2011-12-24 NOTE — Telephone Encounter (Signed)
Spoke to [name] at [facility] regarding patient concern noted in this phone encounter.  Per Tresa Endo, ok to add on to the Monday schedule and include overdue for device check.  Patient called and advised to arrive at 1115 and appointment would be 1130.  Patient advised we are working in so we would get her as soon as possible.  Patient understood.

## 2011-12-29 ENCOUNTER — Ambulatory Visit (INDEPENDENT_AMBULATORY_CARE_PROVIDER_SITE_OTHER): Payer: Medicaid Other | Admitting: Internal Medicine

## 2011-12-29 ENCOUNTER — Encounter: Payer: Self-pay | Admitting: Internal Medicine

## 2011-12-29 ENCOUNTER — Encounter: Payer: Self-pay | Admitting: *Deleted

## 2011-12-29 VITALS — BP 140/90 | HR 69 | Ht 64.0 in | Wt 167.1 lb

## 2011-12-29 DIAGNOSIS — Z950 Presence of cardiac pacemaker: Secondary | ICD-10-CM

## 2011-12-29 DIAGNOSIS — R599 Enlarged lymph nodes, unspecified: Secondary | ICD-10-CM

## 2011-12-29 DIAGNOSIS — I498 Other specified cardiac arrhythmias: Secondary | ICD-10-CM

## 2011-12-29 DIAGNOSIS — I10 Essential (primary) hypertension: Secondary | ICD-10-CM

## 2011-12-29 LAB — PACEMAKER DEVICE OBSERVATION
AL AMPLITUDE: 1.4 mv
AL IMPEDENCE PM: 572 Ohm
AL THRESHOLD: 1 V
ATRIAL PACING PM: 34
BAMS-0001: 175 {beats}/min
BATTERY VOLTAGE: 2.8 V
RV LEAD AMPLITUDE: 11.2 mv
RV LEAD IMPEDENCE PM: 593 Ohm
RV LEAD THRESHOLD: 1 V
VENTRICULAR PACING PM: 0

## 2011-12-29 NOTE — Progress Notes (Signed)
HPI [name] returns today for followup. She is a 61 year old woman with a history of symptomatic bradycardia, status post permanent pacemaker insertion. She has ongoing tobacco abuse. She has hypertension and probable peripheral vascular disease. She also has diabetes. In the interim, she has done well. She denies chest pain, shortness of breath, syncope, or peripheral edema. No weight changes recently. The patient notes a nodule around her clavicle. No fevers or chills. Allergies  Allergen Reactions  . Codeine Phosphate     REACTION: unspecified  . Morphine And Related     Heart problems  . Naproxen     REACTION: vomiting  . Nsaids Nausea Only  . Penicillins     unsure  . Propoxyphene-Acetaminophen     Makes me gag  . Sulfamethoxazole W-Trimethoprim     Gi upset     Current Outpatient Prescriptions  Medication Sig Dispense Refill  . ALPRAZolam (XANAX) 1 MG tablet Take 1 mg by mouth 3 (three) times daily.       Marland Kitchen amLODipine (NORVASC) 10 MG tablet Take 10 mg by mouth daily.      Marland Kitchen aspirin 81 MG tablet Take 81 mg by mouth daily.      . carvedilol (COREG) 25 MG tablet Take 25 mg by mouth 2 (two) times daily with a meal.        . cholecalciferol (VITAMIN D) 1000 UNITS tablet Take 1,000 Units by mouth daily.      . cloNIDine (CATAPRES) 0.2 MG tablet Take 0.2 mg by mouth 3 (three) times daily.        Marland Kitchen HYDROcodone-acetaminophen (NORCO) 10-325 MG per tablet Take 1 tablet by mouth every 8 (eight) hours as needed. pain      . lisinopril-hydrochlorothiazide (PRINZIDE,ZESTORETIC) 20-12.5 MG per tablet Take 1 tablet by mouth daily.        . metFORMIN (GLUCOPHAGE) 500 MG tablet Take 500 mg by mouth 2 (two) times daily with a meal.        . Multiple Vitamin (MULTIVITAMIN) tablet Take 1 tablet by mouth daily.      . polyethylene glycol (MIRALAX / GLYCOLAX) packet Take 17 g by mouth daily.      . sertraline (ZOLOFT) 100 MG tablet Take 100 mg by mouth daily.          Past Medical History   Diagnosis Date  . Anemia   . Hypertension   . Hypothyroidism   . Diabetes mellitus   . Atrial fib/flutter, transient   . Depression   . GERD (gastroesophageal reflux disease)   . Gout   . DJD (degenerative joint disease)   . Diverticulosis   . Internal hemorrhoids   . Anxiety   . Arthritis   . COPD (chronic obstructive pulmonary disease)   . History of pneumonia     ROS:   All systems reviewed and negative except as noted in the HPI.   Past Surgical History  Procedure Date  . Vaginal hysterectomy   . Hernia repair   . Pacemaker insertion   . Tubal ligation      Family History  Problem Relation Age of Onset  . Prostate cancer Father   . Hypertension Mother   . Heart attack Mother   . Heart disease Mother   . Stomach cancer Son   . Diabetes Maternal Aunt   . Heart disease Maternal Uncle   . Colon cancer Neg Hx   . Esophageal cancer Neg Hx   . Rectal cancer Neg Hx  History   Social History  . Marital Status: Married    Spouse Name: N/A    Number of Children: 2  . Years of Education: N/A   Occupational History  . Disabled    Social History Main Topics  . Smoking status: Current Some Day Smoker -- 0.5 packs/day for 30 years    Types: Cigarettes  . Smokeless tobacco: Never Used  . Alcohol Use: No  . Drug Use: No  . Sexually Active: Yes    Birth Control/ Protection: Surgical   Other Topics Concern  . Not on file   Social History Narrative  . No narrative on file     BP 140/90  Pulse 69  Ht 5\' 4"  (1.626 m)  Wt 167 lb 1.9 oz (75.805 kg)  BMI 28.69 kg/m2  Physical Exam:  Well appearing middle-aged woman, NAD HEENT: Unremarkable Neck:  7 cm JVD, no thyromegally Lymphatics:  No adenopathy except for the 1 cm flexible nontender supraclavicular nodule Lungs:  Clear with no wheezes, rales, or rhonchi. Well-healed pacemaker incision. HEART:  Regular rate rhythm, no murmurs, no rubs, no clicks Abd:  soft, positive bowel sounds, no  organomegally, no rebound, no guarding Ext:  2 plus pulses, no edema, no cyanosis, no clubbing Skin:  No rashes no nodules Neuro:  CN II through XII intact, motor grossly intact  DEVICE  Normal device function.  See PaceArt for details.   Assess/Plan:

## 2011-12-29 NOTE — Patient Instructions (Signed)
Your physician recommends that you schedule a follow-up appointment in:  [facility] on March 17th 1 year with Dr Ladona Ridgel

## 2011-12-29 NOTE — Assessment & Plan Note (Signed)
The patient has a slightly enlarged supraclavicular lymph node which is nontender, approximately a centimeter in largest dimension, and  Mobile. I've asked the patient to watch this. If it enlarges, she is instructed to consult her primary physician.

## 2011-12-29 NOTE — Assessment & Plan Note (Signed)
Her Medtronic dual-chamber pacemaker is working normally. We'll plan to recheck in several months. 

## 2011-12-29 NOTE — Assessment & Plan Note (Signed)
Her blood pressure is slightly elevated today. I checked the blood pressure in each arm and did not find a significant difference between the left arm the right arm. The patient notes that previously, her blood pressure was somewhat different in her left arm compared to her right arm, the left arm being lower.

## 2011-12-30 ENCOUNTER — Encounter: Payer: Self-pay | Admitting: Internal Medicine

## 2012-01-08 ENCOUNTER — Other Ambulatory Visit (HOSPITAL_COMMUNITY): Payer: Self-pay | Admitting: Internal Medicine

## 2012-01-08 DIAGNOSIS — Z1231 Encounter for screening mammogram for malignant neoplasm of breast: Secondary | ICD-10-CM

## 2012-01-20 ENCOUNTER — Encounter: Payer: Self-pay | Admitting: Internal Medicine

## 2012-01-20 ENCOUNTER — Ambulatory Visit (INDEPENDENT_AMBULATORY_CARE_PROVIDER_SITE_OTHER): Payer: Medicaid Other | Admitting: Internal Medicine

## 2012-01-20 VITALS — BP 126/74 | HR 88 | Wt 168.0 lb

## 2012-01-20 DIAGNOSIS — R634 Abnormal weight loss: Secondary | ICD-10-CM

## 2012-01-20 LAB — TSH: TSH: 7.076 u[IU]/mL — ABNORMAL HIGH (ref 0.350–4.500)

## 2012-01-20 LAB — HEMOGLOBIN A1C
Hgb A1c MFr Bld: 6.5 % — ABNORMAL HIGH (ref <5.7)
Mean Plasma Glucose: 140 mg/dL — ABNORMAL HIGH (ref <117)

## 2012-01-20 LAB — T4, FREE: Free T4: 0.83 ng/dL (ref 0.80–1.80)

## 2012-01-20 NOTE — Progress Notes (Signed)
Subjective:     Patient ID: [name], female   DOB: 1951-01-05, 61 y.o.   MRN: [id number]  HPI [name] is a 61 y/o AAW referred by her PCP, Dr. [name], for evaluation for abnormal weight loss.  Pt tells me she has lost 137 lbs in 10 months  - brings a pic from before and the change is impressive. She is eating the same things as before. She is not working for the last 2 years, resting after lunch, she is sedentary, does not exercise. She can have episodic +5 lbs increases, then loss again. She started losing weight after her husband had her first stroke and she needed to take care of him. She cannot sleep well at night, waking up every 1.5-2 hours. 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 Dr. Roseanne Reno gave her Xanax to help with anxiety and sleep, but not always working. Hot flushes are not an issue.    Diet: - b'fast: scrambled eggs+cheese+1 slice of bread; 1 glass 2% milk - lunch: soup or sandwich - dinner: chicken + creamed potatoes, or spinach - no sweats b/c of her previous dx of DM2 - snacks: PB and crackers, potato chips, bananas - mostly grazing  She was dx with DM2 approx. 7 years ago. She was started on metformin (tolerating it well) and glipizide up to 1 year ago when started to lose weight and she also started to drop her sugars then (to 24! At one point). She still takes metformin 500 mg bid but was taken off the glipizide. She checks her sugars every day: - am: 98-104, occasionally 70s >> feels it - during the day if feels lightheaded: usually low 60's, 70's - at night, 2h after dinner: 140-160 Lowest sugars recently (the other day): 57. Highest sugars: 190, once after dinner, otherwise 140-160 max. No complications from DM, unclear if has neuropathy - has tingling. Last appt with ophthalmologist: 2 years ago, when she was having insurance. No CKD. She has FH of DM in her family (aunts).   She  has a h/o hypothyroidism since she was 61 y/o and was on Synthroid - 6 years ago while in the hospital, she was taken off Synthroid. Reviewing her chart, her last available TSH was 2.6 in 2008.  She also tells me she used to have a goiter and was supposed to have surgery to remove it but "it went away" so no surgery.  She has a h/o autoimmune ds.: RA in her family (on father's side) and she has it, too, she mentions, although this does not appear in her med list. She did not see a rheumatologist since she lost her previous insurance. No N/V/D/C or AP. No HAs. No Addison ds in family.   She has a FH of Prostate cancer, older son had stomach cancer and died at 46 y/o. She has mammograms every year. Has  2 small LNs in her left breast: 8-9 mm, 20-23 mm away from the nipple, at 2 o'clock. Breast U/S in 2012 and 2013 was reassuring, stable, with fatty hilum. She had a CT abd and pelvis for AP in epigastic and LUQ > only gas. She had a recent colonoscopy this year > internal hemorrhoids, hyperplastic polyps. PAP smear neg. 12/2010. She had a partial hysterectomy in 1990-1991. Dr Ladona Ridgel found a small 1 cm LN in the supraclavicular area X 1 mo, and she has another  one in the left inguinal area (for 3-4 months). Dr. Roseanne Reno gave her ABx (cannot remember name) for 10 days, finished them 2 weeks ago. She feels that the LN in the L inguinal area is maybe a little smaller.  She used to smoke 2 ppd x 30 years, then dropped to 1 ppd, now smokes 10 cigs a day and wants to quit. She tried Chantix but had nightmares and felt mean and jittery. No alcohol or other drugs. No significant cough over last year, now starting to have some productive cough in past 3 months. The cough is usually in am, when she wakes in am. No blood in sputum. Not improving, sable.   Review of Systems Constitutional: no weight gain/loss, + fatigue for few years, + subjective hypothermia Eyes: + blurry vision ENT: no sore throat, no nodules palpated  in throat, no dysphagia/odynophagia, + hoarseness Cardiovascular: no CP/SOB/palpitations/leg swelling Respiratory: no cough/SOB Gastrointestinal: no N/V/D/C Musculoskeletal: no muscle/joint aches Skin: has eczema, dry skin, losing hair Neurological: no tremors/numbness/tingling/dizziness Psychiatric: + depression, + anxiety  Past Medical History  Diagnosis Date  . Anemia   . Hypertension   . Hypothyroidism   . Diabetes mellitus   . Atrial fib/flutter, transient   . Depression   . GERD (gastroesophageal reflux disease)   . Gout   . DJD (degenerative joint disease)   . Diverticulosis   . Internal hemorrhoids   . Anxiety   . Arthritis   . COPD (chronic obstructive pulmonary disease)   . History of pneumonia    Past Surgical History  Procedure Date  . Vaginal hysterectomy   . Hernia repair   . Pacemaker insertion   . Tubal ligation    History   Social History  . Marital Status: Married    Spouse Name: N/A    Number of Children: 2  . Years of Education: N/A   Occupational History  . Disabled    Social History Main Topics  . Smoking status: Current Some Day Smoker -- 0.5 packs/day for 30 years    Types: Cigarettes  . Smokeless tobacco: Never Used  . Alcohol Use: No  . Drug Use: No  . Sexually Active: Yes    Birth Control/ Protection: Surgical   Other Topics Concern  . Not on file   Social History Narrative  . No narrative on file   Medication Sig  . ALPRAZolam (XANAX) 1 MG tablet Take 1 mg by mouth 3 (three) times daily.   Marland Kitchen amLODipine (NORVASC) 10 MG tablet Take 10 mg by mouth daily.  Marland Kitchen aspirin 81 MG tablet Take 81 mg by mouth daily.  . carvedilol (COREG) 25 MG tablet Take 25 mg by mouth 2 (two) times daily with a meal.    . cholecalciferol (VITAMIN D) 1000 UNITS tablet Take 1,000 Units by mouth daily.  . cloNIDine (CATAPRES) 0.2 MG tablet Take 0.2 mg by mouth 3 (three) times daily.    Marland Kitchen HYDROcodone-acetaminophen (NORCO) 10-325 MG per tablet Take 1 tablet  by mouth every 8 (eight) hours as needed. pain  . lisinopril-hydrochlorothiazide (PRINZIDE,ZESTORETIC) 20-12.5 MG per tablet Take 1 tablet by mouth daily.    . Multiple Vitamin (MULTIVITAMIN) tablet Take 1 tablet by mouth daily.  . polyethylene glycol (MIRALAX / GLYCOLAX) packet Take 17 g by mouth daily.  . sertraline (ZOLOFT) 100 MG tablet Take 100 mg by mouth daily.    Allergies  Allergen Reactions  . Codeine Phosphate     REACTION: unspecified  . Morphine And  Related     Heart problems  . Naproxen     REACTION: vomiting  . Nsaids Nausea Only  . Penicillins     unsure  . Propoxyphene-Acetaminophen     Makes me gag  . Sulfamethoxazole W-Trimethoprim     Gi upset   Family History  Problem Relation Age of Onset  . Prostate cancer Father   . Hypertension Mother   . Heart attack Mother   . Heart disease Mother   . Stomach cancer Son   . Diabetes Maternal Aunt   . Heart disease Maternal Uncle   . Colon cancer Neg Hx   . Esophageal cancer Neg Hx   . Rectal cancer Neg Hx    Objective:   Physical Exam BP 126/74  Pulse 88  Wt 168 lb (76.204 kg)  SpO2 98% Constitutional: normal weight, in NAD. Not cachectic looking. Eyes: PERRLA, EOMI, no exophthalmos ENT: moist mucous membranes, no thyromegaly, no cervical lymphadenopathy - very soft ~1 cm mass (LN?) in the medial subclavicular area, somewhat uncomfortable to touch, not painful Cardiovascular: RRR, No MRG - pacemaker under skin R pectoral area Respiratory: CTA B Gastrointestinal: abdomen soft, NT, ND, BS+ Musculoskeletal: no deformities, strength intact in all 4 Skin: moist, warm, no rashes Neurological: no tremor with outstretched hands, DTR normal in all 4  Assessment:     1. Weight loss, abnormal    Plan:     1. Main concern for this pt, with her h/o long standing tobacco abuse would be lung cancer - small RLL nodule seen on an abd. CT, benign looking. She has a new productive cough x 3 months. I do not see a  noncontrasted CT chest in our computer system, but, since Dr. Roseanne Reno is not in the system, she might have had it somewhere else (she cannot remember). I would suggest to Dr. Roseanne Reno to obtain one if not recently done. - hyperthyroidism is a concern, she has a h/o hypothyroidism but taken off Synthroid 6 years ago (?). Since I do not see a recent TSH, will check one (tried to obtain labs from Dr. Ginette Otto office during the appt., but likely lunchtime and got VM) - DM2 can cause weight loss if uncontrolled; will also check a HbA1C, but based on her sugars, she can most likely come off Metformin which can reduce appetite - I will see her in 3 mo to follow on her DM2 - will add a cortisol to r/o adrenal insufficiency- I would have liked to get an 8 am one, but pt mentions she cannot come that early. Will get one now, at noon, and if low, will need a morning one or a stim test - her subclavicular mass is very soft and somewhat tender > I think she might need to be evaluated for a possible malignancy, especially with her inguinal enlarged LN, too.  - colon cancer r/o by recent colonoscopy - PAP smears neg, had partial hysterectomy so no uterine, cervical CA - Breast CA r/o per recent U/S - had 2 subcm nodules on mammogram, but U/S reassuring - other rheumatologic disorders - she has dry eyes ? Sjogren > advised to try to check which rheumatologists in town take M'aid pts - celiac ds. also a possibility, but no anemia, no diarrhea, pt seen by Dr. Russella Dar with GI  Orders Placed This Encounter  Procedures  . TSH  . T4, free  . HgB A1c  . Cortisol   RTC in 3 months. Will call her with  results.  Office Visit on 01/20/2012  Component Date Value Range Status  . TSH 01/20/2012 7.076* 0.350 - 4.500 uIU/mL Final  . Free T4 01/20/2012 0.83  0.80 - 1.80 ng/dL Final  . Hemoglobin A2Z 01/20/2012 6.5* <5.7 % Final  . Mean Plasma Glucose 01/20/2012 140* <117 mg/dL Final  . Cortisol, Plasma 01/20/2012 6.7   Final    Comment:                            AM:  4.3 - 22.4 ug/dL                          PM:  3.1 - 16.7 ug/dL   Subclinical hypothyroidism - will repeat TSH and fT4 at next visit, in 3 mo. Cortisol on the low side, but normal for PM. Will repeat an 8 am level.

## 2012-01-20 NOTE — Patient Instructions (Addendum)
Please stop the Metformin. Please return in 3 months with your sugar log.

## 2012-01-21 LAB — CORTISOL: Cortisol, Plasma: 6.7 ug/dL

## 2012-01-22 ENCOUNTER — Encounter: Payer: Self-pay | Admitting: Internal Medicine

## 2012-01-22 NOTE — Progress Notes (Signed)
Tried to call pt to explain results and the need for 8 am cortisol but was unsuccessful and could not leave message (unsure if her phone is working)..These results are good.  Please take medication as we discussed. Sent.

## 2012-01-23 ENCOUNTER — Ambulatory Visit (HOSPITAL_COMMUNITY): Payer: Medicaid Other

## 2012-01-26 ENCOUNTER — Ambulatory Visit: Payer: Medicaid Other | Admitting: Internal Medicine

## 2012-02-16 ENCOUNTER — Ambulatory Visit (HOSPITAL_COMMUNITY)
Admission: RE | Admit: 2012-02-16 | Discharge: 2012-02-16 | Disposition: A | Discharge status: Home / Self Care | Payer: Self-pay | Source: Ambulatory Visit | Attending: Internal Medicine | Admitting: Internal Medicine

## 2012-02-16 DIAGNOSIS — Z1231 Encounter for screening mammogram for malignant neoplasm of breast: Secondary | ICD-10-CM

## 2012-02-26 ENCOUNTER — Ambulatory Visit: Payer: Medicaid Other | Admitting: Internal Medicine

## 2012-04-05 ENCOUNTER — Encounter: Payer: Medicaid Other | Admitting: *Deleted

## 2012-04-12 ENCOUNTER — Telehealth: Payer: Self-pay | Admitting: Internal Medicine

## 2012-04-12 NOTE — Telephone Encounter (Signed)
Patient called to state she missed her Remote PCR ck on 03/17.  Please call patient to advise on having this done.

## 2012-04-12 NOTE — Telephone Encounter (Signed)
Spoke with patient, she missed her remote 04/05/12.  She will send later today.

## 2012-04-14 ENCOUNTER — Other Ambulatory Visit: Payer: Self-pay | Admitting: Internal Medicine

## 2012-04-14 ENCOUNTER — Encounter: Payer: Self-pay | Admitting: Internal Medicine

## 2012-04-14 ENCOUNTER — Encounter: Payer: Self-pay | Admitting: *Deleted

## 2012-04-14 ENCOUNTER — Ambulatory Visit (INDEPENDENT_AMBULATORY_CARE_PROVIDER_SITE_OTHER): Payer: Medicaid Other | Admitting: *Deleted

## 2012-04-14 DIAGNOSIS — I498 Other specified cardiac arrhythmias: Secondary | ICD-10-CM

## 2012-04-14 DIAGNOSIS — Z950 Presence of cardiac pacemaker: Secondary | ICD-10-CM

## 2012-04-21 LAB — REMOTE PACEMAKER DEVICE
AL AMPLITUDE: 2.8 mv
AL IMPEDENCE PM: 563 Ohm
AL THRESHOLD: 1.125 V
ATRIAL PACING PM: 46
BAMS-0001: 175 {beats}/min
BATTERY VOLTAGE: 2.79 V
RV LEAD AMPLITUDE: 22.4 mv
RV LEAD IMPEDENCE PM: 658 Ohm
RV LEAD THRESHOLD: 1 V
VENTRICULAR PACING PM: 1

## 2012-05-04 ENCOUNTER — Encounter: Payer: Self-pay | Admitting: *Deleted

## 2012-05-14 ENCOUNTER — Emergency Department (HOSPITAL_COMMUNITY): Payer: Self-pay

## 2012-05-14 ENCOUNTER — Other Ambulatory Visit: Payer: Self-pay

## 2012-05-14 ENCOUNTER — Encounter (HOSPITAL_COMMUNITY): Payer: Self-pay | Admitting: Emergency Medicine

## 2012-05-14 ENCOUNTER — Emergency Department (HOSPITAL_COMMUNITY)
Admission: EM | Admit: 2012-05-14 | Discharge: 2012-05-14 | Disposition: A | Discharge status: Home / Self Care | Payer: Self-pay | Attending: Emergency Medicine | Admitting: Emergency Medicine

## 2012-05-14 DIAGNOSIS — Z8739 Personal history of other diseases of the musculoskeletal system and connective tissue: Secondary | ICD-10-CM | POA: Insufficient documentation

## 2012-05-14 DIAGNOSIS — Z8719 Personal history of other diseases of the digestive system: Secondary | ICD-10-CM | POA: Insufficient documentation

## 2012-05-14 DIAGNOSIS — X500XXA Overexertion from strenuous movement or load, initial encounter: Secondary | ICD-10-CM | POA: Insufficient documentation

## 2012-05-14 DIAGNOSIS — F329 Major depressive disorder, single episode, unspecified: Secondary | ICD-10-CM | POA: Insufficient documentation

## 2012-05-14 DIAGNOSIS — J449 Chronic obstructive pulmonary disease, unspecified: Secondary | ICD-10-CM | POA: Insufficient documentation

## 2012-05-14 DIAGNOSIS — Z8639 Personal history of other endocrine, nutritional and metabolic disease: Secondary | ICD-10-CM | POA: Insufficient documentation

## 2012-05-14 DIAGNOSIS — J4489 Other specified chronic obstructive pulmonary disease: Secondary | ICD-10-CM | POA: Insufficient documentation

## 2012-05-14 DIAGNOSIS — Z79899 Other long term (current) drug therapy: Secondary | ICD-10-CM | POA: Insufficient documentation

## 2012-05-14 DIAGNOSIS — Z8679 Personal history of other diseases of the circulatory system: Secondary | ICD-10-CM | POA: Insufficient documentation

## 2012-05-14 DIAGNOSIS — Z862 Personal history of diseases of the blood and blood-forming organs and certain disorders involving the immune mechanism: Secondary | ICD-10-CM | POA: Insufficient documentation

## 2012-05-14 DIAGNOSIS — Y929 Unspecified place or not applicable: Secondary | ICD-10-CM | POA: Insufficient documentation

## 2012-05-14 DIAGNOSIS — F3289 Other specified depressive episodes: Secondary | ICD-10-CM | POA: Insufficient documentation

## 2012-05-14 DIAGNOSIS — Z7982 Long term (current) use of aspirin: Secondary | ICD-10-CM | POA: Insufficient documentation

## 2012-05-14 DIAGNOSIS — E119 Type 2 diabetes mellitus without complications: Secondary | ICD-10-CM | POA: Insufficient documentation

## 2012-05-14 DIAGNOSIS — Z8701 Personal history of pneumonia (recurrent): Secondary | ICD-10-CM | POA: Insufficient documentation

## 2012-05-14 DIAGNOSIS — F411 Generalized anxiety disorder: Secondary | ICD-10-CM | POA: Insufficient documentation

## 2012-05-14 DIAGNOSIS — I10 Essential (primary) hypertension: Secondary | ICD-10-CM | POA: Insufficient documentation

## 2012-05-14 DIAGNOSIS — F17200 Nicotine dependence, unspecified, uncomplicated: Secondary | ICD-10-CM | POA: Insufficient documentation

## 2012-05-14 DIAGNOSIS — Y939 Activity, unspecified: Secondary | ICD-10-CM | POA: Insufficient documentation

## 2012-05-14 DIAGNOSIS — T148XXA Other injury of unspecified body region, initial encounter: Secondary | ICD-10-CM

## 2012-05-14 LAB — COMPREHENSIVE METABOLIC PANEL
ALT: 10 U/L (ref 0–35)
AST: 20 U/L (ref 0–37)
Albumin: 3.8 g/dL (ref 3.5–5.2)
Alkaline Phosphatase: 75 U/L (ref 39–117)
BUN: 23 mg/dL (ref 6–23)
CO2: 30 mEq/L (ref 19–32)
Calcium: 9.4 mg/dL (ref 8.4–10.5)
Chloride: 99 mEq/L (ref 96–112)
Creatinine, Ser: 1.12 mg/dL — ABNORMAL HIGH (ref 0.50–1.10)
GFR calc Af Amer: 60 mL/min — ABNORMAL LOW (ref >90)
GFR calc non Af Amer: 52 mL/min — ABNORMAL LOW (ref >90)
Glucose, Bld: 91 mg/dL (ref 70–99)
Potassium: 3.6 mEq/L (ref 3.5–5.1)
Sodium: 137 mEq/L (ref 135–145)
Total Bilirubin: 0.1 mg/dL — ABNORMAL LOW (ref 0.3–1.2)
Total Protein: 7.2 g/dL (ref 6.0–8.3)

## 2012-05-14 LAB — D-DIMER, QUANTITATIVE: D-Dimer, Quant: 0.38 ug/mL-FEU (ref 0.00–0.48)

## 2012-05-14 LAB — CBC WITH DIFFERENTIAL/PLATELET
Basophils Absolute: 0 10*3/uL (ref 0.0–0.1)
Basophils Relative: 0 % (ref 0–1)
Eosinophils Absolute: 0.2 10*3/uL (ref 0.0–0.7)
Eosinophils Relative: 3 % (ref 0–5)
HCT: 34.4 % — ABNORMAL LOW (ref 36.0–46.0)
Hemoglobin: 11.7 g/dL — ABNORMAL LOW (ref 12.0–15.0)
Lymphocytes Relative: 46 % (ref 12–46)
Lymphs Abs: 3.5 10*3/uL (ref 0.7–4.0)
MCH: 31.2 pg (ref 26.0–34.0)
MCHC: 34 g/dL (ref 30.0–36.0)
MCV: 91.7 fL (ref 78.0–100.0)
Monocytes Absolute: 0.5 10*3/uL (ref 0.1–1.0)
Monocytes Relative: 7 % (ref 3–12)
Neutro Abs: 3.4 10*3/uL (ref 1.7–7.7)
Neutrophils Relative %: 45 % (ref 43–77)
Platelets: 237 10*3/uL (ref 150–400)
RBC: 3.75 MIL/uL — ABNORMAL LOW (ref 3.87–5.11)
RDW: 14.8 % (ref 11.5–15.5)
WBC: 7.6 10*3/uL (ref 4.0–10.5)

## 2012-05-14 LAB — TROPONIN I: Troponin I: <0.3 ng/mL (ref <0.30)

## 2012-05-14 MED ORDER — METOPROLOL TARTRATE 1 MG/ML IV SOLN: 5.0000 mg | Freq: Once | INTRAVENOUS | Status: DC

## 2012-05-14 MED ORDER — SODIUM CHLORIDE 0.9 % IV BOLUS (SEPSIS): 500.0000 mL | Freq: Once | INTRAVENOUS | Status: DC

## 2012-05-14 NOTE — ED Notes (Signed)
Ambulatory in hallway with standby assist, complains of right upper back/shoulder pain with movement.

## 2012-05-14 NOTE — ED Provider Notes (Signed)
History  This chart was scribed for Flint Melter, MD by Greggory Stallion, ED Scribe and Bennett Scrape, ED Scribe. This patient was seen in room APA18/[facility]18 and the patient's care was started at 3:06 PM.   CSN: [id number]  Arrival date & time 05/14/12  1436     Chief Complaint  Patient presents with  . Back Pain     The history is provided by the patient. No language interpreter was used.   [name] is a 62 y.o. female with h/o arthritis who presents to the Emergency Department complaining of upper back pain that started this morning. The pain is located at the right scapula. The pain is dull.  Pt states she turned around this morning and it got worse. She states it gets worse when she gets up or turns around. She denies that the pain is affected by deep breathing or movement of the left arm. Pt reports that she has taken 2 oxycodone pills today. She normally takes three a day, one in the morning, afternoon, and before bed and states that they are prescribed by the [facility] for her chronic back pain from arthritis. Pt states she smokes. Pt denies neck pain, arm pain, abdominal pain, CP, SOB, and urinary symtoms as associated symptoms.   PCP is [employer].  Past Medical History  Diagnosis Date  . Anemia   . Hypertension   . Hypothyroidism   . Diabetes mellitus   . Atrial fib/flutter, transient   . Depression   . GERD (gastroesophageal reflux disease)   . Gout   . DJD (degenerative joint disease)   . Diverticulosis   . Internal hemorrhoids   . Anxiety   . Arthritis   . COPD (chronic obstructive pulmonary disease)   . History of pneumonia     Past Surgical History  Procedure Laterality Date  . Vaginal hysterectomy    . Hernia repair    . Pacemaker insertion    . Tubal ligation      Family History  Problem Relation Age of Onset  . Prostate cancer Father   . Hypertension Mother   . Heart attack Mother   . Heart disease Mother   . Stomach cancer Son   .  Diabetes Maternal Aunt   . Heart disease Maternal Uncle   . Colon cancer Neg Hx   . Esophageal cancer Neg Hx   . Rectal cancer Neg Hx     History  Substance Use Topics  . Smoking status: Current Some Day Smoker -- 0.50 packs/day for 30 years    Types: Cigarettes  . Smokeless tobacco: Never Used  . Alcohol Use: No    OB History   Grav Para Term Preterm Abortions TAB SAB Ect Mult Living   3    1 1    2       Review of Systems  HENT: Negative for neck pain.   Respiratory: Negative for shortness of breath.   Cardiovascular: Negative for chest pain.  Gastrointestinal: Negative for abdominal pain.  Genitourinary: Negative for difficulty urinating.  Musculoskeletal: Positive for back pain (upper).    Allergies  Codeine phosphate; Morphine and related; Naproxen; Nsaids; Penicillins; Propoxyphene-acetaminophen; and Sulfamethoxazole w-trimethoprim  Home Medications   Current Outpatient Rx  Name  Route  Sig  Dispense  Refill  . ALPRAZolam (XANAX) 1 MG tablet   Oral   Take 1 mg by mouth 3 (three) times daily.          Marland Kitchen amLODipine (  NORVASC) 10 MG tablet   Oral   Take 10 mg by mouth every morning.          Marland Kitchen aspirin 81 MG tablet   Oral   Take 81 mg by mouth every morning.          . carvedilol (COREG) 25 MG tablet   Oral   Take 25 mg by mouth 2 (two) times daily with a meal.           . cholecalciferol (VITAMIN D) 1000 UNITS tablet   Oral   Take 1,000 Units by mouth every morning.          . cloNIDine (CATAPRES) 0.2 MG tablet   Oral   Take 0.2 mg by mouth 3 (three) times daily.           Marland Kitchen lisinopril-hydrochlorothiazide (PRINZIDE,ZESTORETIC) 20-12.5 MG per tablet   Oral   Take 1 tablet by mouth every morning.          . metFORMIN (GLUCOPHAGE) 500 MG tablet   Oral   Take 500 mg by mouth 2 (two) times daily.         . Multiple Vitamin (MULTIVITAMIN) tablet   Oral   Take 1 tablet by mouth every morning.          . Omega-3 Fatty Acids 300 MG  CAPS   Oral   Take 300 mg by mouth 3 (three) times daily.          Marland Kitchen oxyCODONE (OXYCONTIN) 20 MG 12 hr tablet   Oral   Take 20 mg by mouth 3 (three) times daily.         . polyethylene glycol (MIRALAX / GLYCOLAX) packet   Oral   Take 17 g by mouth 2 (two) times daily.          . sertraline (ZOLOFT) 100 MG tablet   Oral   Take 100 mg by mouth every morning.             Triage Vitals: BP 119/75  Pulse 67  Temp(Src) 98.7 F (37.1 C) (Oral)  Resp 19  SpO2 98%  Physical Exam  Nursing note and vitals reviewed. Constitutional: She is oriented to person, place, and time. She appears well-developed and well-nourished.  HENT:  Head: Normocephalic and atraumatic.  Eyes: Conjunctivae and EOM are normal. Pupils are equal, round, and reactive to light.  Neck: Normal range of motion and phonation normal. Neck supple.  Cardiovascular: Normal rate, regular rhythm and intact distal pulses.   Pulmonary/Chest: Effort normal. She has no wheezes. She has no rales. She exhibits no tenderness.  Decreased air movement bilaterally  Abdominal: Soft. She exhibits no distension. There is no tenderness. There is no guarding.  Musculoskeletal: Normal range of motion.  Tenderness on right scapula  Neurological: She is alert and oriented to person, place, and time. She has normal strength. She exhibits normal muscle tone.  Skin: Skin is warm and dry.  Psychiatric: She has a normal mood and affect. Her behavior is normal. Judgment and thought content normal.    ED Course  Procedures (including critical care time)  DIAGNOSTIC STUDIES: Oxygen Saturation is 98% on RA, normal by my interpretation.    COORDINATION OF CARE: 3:22 PM-Discussed treatment plan which includes chest DG, d-dimer, CBC, CMP, EKG, and Troponin with pt at bedside and pt agreed to plan.   6:09 PM- Upon recheck, pt states she feels better. Plan: Home Medications- Tylenol; Home Treatments- heat; Recommended follow up- return  if needed, follow up with PCP if needed    Date: 11/07/2011  Rate: 95  Rhythm: Sinus, with demand pacing  QRS Axis: normal  PR and QT Intervals: QT prolonged  ST/T Wave abnormalities: normal  PR and QRS Conduction Disutrbances:Normal, intermittent pacing  Narrative Interpretation:   Old EKG Reviewed: Pacing new, since 05/19/2007   Labs Reviewed  CBC WITH DIFFERENTIAL - Abnormal; Notable for the following:    RBC 3.75 (*)    Hemoglobin 11.7 (*)    HCT 34.4 (*)    All other components within normal limits  COMPREHENSIVE METABOLIC PANEL - Abnormal; Notable for the following:    Creatinine, Ser 1.12 (*)    Total Bilirubin 0.1 (*)    GFR calc non Af Amer 52 (*)    GFR calc Af Amer 60 (*)    All other components within normal limits  TROPONIN I  D-DIMER, QUANTITATIVE   Dg Chest Portable 1 View  05/14/2012  *RADIOLOGY REPORT*  Clinical Data: COPD.  Hypertension.  Atrial fibrillation.  PORTABLE CHEST - 1 VIEW  Comparison: 04/04/2010  Findings: Pacemaker leads appear the same.  The heart is at upper limits normal in size.  Mediastinal shadows are otherwise normal. Lungs are clear.  The vascularity is normal.  No effusions.  No acute bony finding.  IMPRESSION: No active cardiopulmonary disease.  Multiple pacemaker leads.   Original Report Authenticated By: Paulina Fusi, M.D.     1. Muscle strain       MDM   Nursing Notes Reviewed/ Care Coordinated, and agree without changes. Applicable Imaging Reviewed.  Interpretation of Laboratory Data incorporated into ED treatment  Nonspecific right upper back pain, localized to the right scapula. It is consistent with musculoskeletal source, due to its movement related aggravation. Screening evaluation negative for     I personally performed the services described in this documentation, which was scribed in my presence. The recorded information has been reviewed and is accurate.     Flint Melter, MD 05/14/12 2228

## 2012-05-14 NOTE — ED Notes (Signed)
The patient states that she started feeling short of breath this morning and then started having right sided chest pain located just behind her pacemaker (right upper chest), states that the pain radiates into her right back.

## 2012-07-18 ENCOUNTER — Emergency Department (HOSPITAL_COMMUNITY): Payer: Self-pay

## 2012-07-18 ENCOUNTER — Encounter (HOSPITAL_COMMUNITY): Payer: Self-pay | Admitting: *Deleted

## 2012-07-18 ENCOUNTER — Emergency Department (HOSPITAL_COMMUNITY)
Admission: EM | Admit: 2012-07-18 | Discharge: 2012-07-18 | Disposition: A | Discharge status: Home / Self Care | Payer: Self-pay | Attending: Emergency Medicine | Admitting: Emergency Medicine

## 2012-07-18 DIAGNOSIS — M79601 Pain in right arm: Secondary | ICD-10-CM

## 2012-07-18 DIAGNOSIS — M542 Cervicalgia: Secondary | ICD-10-CM

## 2012-07-18 DIAGNOSIS — Z8719 Personal history of other diseases of the digestive system: Secondary | ICD-10-CM | POA: Insufficient documentation

## 2012-07-18 DIAGNOSIS — M109 Gout, unspecified: Secondary | ICD-10-CM | POA: Insufficient documentation

## 2012-07-18 DIAGNOSIS — Z7982 Long term (current) use of aspirin: Secondary | ICD-10-CM | POA: Insufficient documentation

## 2012-07-18 DIAGNOSIS — S199XXA Unspecified injury of neck, initial encounter: Secondary | ICD-10-CM | POA: Insufficient documentation

## 2012-07-18 DIAGNOSIS — Y929 Unspecified place or not applicable: Secondary | ICD-10-CM | POA: Insufficient documentation

## 2012-07-18 DIAGNOSIS — Z880 Allergy status to penicillin: Secondary | ICD-10-CM | POA: Insufficient documentation

## 2012-07-18 DIAGNOSIS — F329 Major depressive disorder, single episode, unspecified: Secondary | ICD-10-CM | POA: Insufficient documentation

## 2012-07-18 DIAGNOSIS — Z862 Personal history of diseases of the blood and blood-forming organs and certain disorders involving the immune mechanism: Secondary | ICD-10-CM | POA: Insufficient documentation

## 2012-07-18 DIAGNOSIS — R51 Headache: Secondary | ICD-10-CM | POA: Insufficient documentation

## 2012-07-18 DIAGNOSIS — Z950 Presence of cardiac pacemaker: Secondary | ICD-10-CM | POA: Insufficient documentation

## 2012-07-18 DIAGNOSIS — S6990XA Unspecified injury of unspecified wrist, hand and finger(s), initial encounter: Secondary | ICD-10-CM | POA: Insufficient documentation

## 2012-07-18 DIAGNOSIS — E119 Type 2 diabetes mellitus without complications: Secondary | ICD-10-CM | POA: Insufficient documentation

## 2012-07-18 DIAGNOSIS — W28XXXA Contact with powered lawn mower, initial encounter: Secondary | ICD-10-CM | POA: Insufficient documentation

## 2012-07-18 DIAGNOSIS — I4891 Unspecified atrial fibrillation: Secondary | ICD-10-CM | POA: Insufficient documentation

## 2012-07-18 DIAGNOSIS — S59909A Unspecified injury of unspecified elbow, initial encounter: Secondary | ICD-10-CM | POA: Insufficient documentation

## 2012-07-18 DIAGNOSIS — F17200 Nicotine dependence, unspecified, uncomplicated: Secondary | ICD-10-CM | POA: Insufficient documentation

## 2012-07-18 DIAGNOSIS — M1990 Unspecified osteoarthritis, unspecified site: Secondary | ICD-10-CM | POA: Insufficient documentation

## 2012-07-18 DIAGNOSIS — I10 Essential (primary) hypertension: Secondary | ICD-10-CM | POA: Insufficient documentation

## 2012-07-18 DIAGNOSIS — Z79899 Other long term (current) drug therapy: Secondary | ICD-10-CM | POA: Insufficient documentation

## 2012-07-18 DIAGNOSIS — Z8701 Personal history of pneumonia (recurrent): Secondary | ICD-10-CM | POA: Insufficient documentation

## 2012-07-18 DIAGNOSIS — F411 Generalized anxiety disorder: Secondary | ICD-10-CM | POA: Insufficient documentation

## 2012-07-18 DIAGNOSIS — J4489 Other specified chronic obstructive pulmonary disease: Secondary | ICD-10-CM | POA: Insufficient documentation

## 2012-07-18 DIAGNOSIS — S0993XA Unspecified injury of face, initial encounter: Secondary | ICD-10-CM | POA: Insufficient documentation

## 2012-07-18 DIAGNOSIS — Z8679 Personal history of other diseases of the circulatory system: Secondary | ICD-10-CM | POA: Insufficient documentation

## 2012-07-18 DIAGNOSIS — R350 Frequency of micturition: Secondary | ICD-10-CM | POA: Insufficient documentation

## 2012-07-18 DIAGNOSIS — J449 Chronic obstructive pulmonary disease, unspecified: Secondary | ICD-10-CM | POA: Insufficient documentation

## 2012-07-18 DIAGNOSIS — Y9389 Activity, other specified: Secondary | ICD-10-CM | POA: Insufficient documentation

## 2012-07-18 DIAGNOSIS — Z8639 Personal history of other endocrine, nutritional and metabolic disease: Secondary | ICD-10-CM | POA: Insufficient documentation

## 2012-07-18 DIAGNOSIS — F3289 Other specified depressive episodes: Secondary | ICD-10-CM | POA: Insufficient documentation

## 2012-07-18 MED ORDER — OXYCODONE-ACETAMINOPHEN 5-325 MG PO TABS: 1.0000 | ORAL_TABLET | ORAL | Status: DC | PRN

## 2012-07-18 NOTE — ED Provider Notes (Signed)
History  This chart was scribed for Joya Gaskins, MD by Manuela Schwartz, ED scribe. This patient was seen in room [facility]19/[facility]19 and the patient's care was started at 1426.  CSN: [id number] Arrival date & time 07/18/12  1426  First MD Initiated Contact with Patient 07/18/12 [phone]     Chief Complaint  Patient presents with  . Back Pain  . Arm Pain   Patient is a 62 y.o. female presenting with arm pain. The history is provided by the patient. No language interpreter was used.  Arm Pain This is a new problem. The current episode started more than 1 week ago. The problem occurs constantly. The problem has been gradually worsening. Associated symptoms include headaches. Pertinent negatives include no chest pain, no abdominal pain and no shortness of breath. The symptoms are aggravated by bending. Nothing relieves the symptoms. She has tried a cold compress for the symptoms. The treatment provided mild relief.   HPI Comments: [name] is a 62 y.o. female who presents to the Emergency Department complaining of worsened right arm tingling/parasthesias in her entire right arm/elbow after she strained her right arm while using her riding lawn mower x2 weeks ago. She states radiation of tingling/numbness from her right shoulder down her entire right arm as well as diffusely sore/tender upper/mid back. She states sx had gradually improved over past x2 weeks applying ice to her arms but worsened again yesterday. She states associated HA for 2 days, generalized neck pain/soreness. She denies recent falls, fever, visual disturbances, chest pain, SOB, diaphoresis, BM sx, or blood thinner medicines. She states was seen here over a month ago for back problems/strained muscle.    Past Medical History  Diagnosis Date  . Anemia   . Hypertension   . Hypothyroidism   . Diabetes mellitus   . Atrial fib/flutter, transient   . Depression   . GERD (gastroesophageal reflux disease)   . Gout   . DJD (degenerative  joint disease)   . Diverticulosis   . Internal hemorrhoids   . Anxiety   . Arthritis   . COPD (chronic obstructive pulmonary disease)   . History of pneumonia    Past Surgical History  Procedure Laterality Date  . Vaginal hysterectomy    . Hernia repair    . Pacemaker insertion    . Tubal ligation     Family History  Problem Relation Age of Onset  . Prostate cancer Father   . Hypertension Mother   . Heart attack Mother   . Heart disease Mother   . Stomach cancer Son   . Diabetes Maternal Aunt   . Heart disease Maternal Uncle   . Colon cancer Neg Hx   . Esophageal cancer Neg Hx   . Rectal cancer Neg Hx    History  Substance Use Topics  . Smoking status: Current Some Day Smoker -- 0.50 packs/day for 30 years    Types: Cigarettes  . Smokeless tobacco: Never Used  . Alcohol Use: No   OB History   Grav Para Term Preterm Abortions TAB SAB Ect Mult Living   3    1 1    2      Review of Systems  Constitutional: Negative for fever and chills.  HENT: Positive for neck stiffness (right side neck). Negative for congestion and rhinorrhea.   Eyes: Negative for visual disturbance.  Respiratory: Negative for shortness of breath.   Cardiovascular: Negative for chest pain.  Gastrointestinal: Negative for nausea, vomiting, abdominal pain, diarrhea  and blood in stool.  Genitourinary: Positive for frequency.  Musculoskeletal: Positive for back pain (diffusely sore back).  Skin: Negative for color change and wound.  Neurological: Positive for weakness (weakness right arm), numbness (right arm) and headaches. Negative for dizziness, seizures and syncope.  All other systems reviewed and are negative.  A complete 10 system review of systems was obtained and all systems are negative except as noted in the HPI and PMH.    Allergies  Codeine phosphate; Morphine and related; Naproxen; Nsaids; Penicillins; Propoxyphene-acetaminophen; and Sulfamethoxazole w-trimethoprim  Home Medications    Current Outpatient Rx  Name  Route  Sig  Dispense  Refill  . ALPRAZolam (XANAX) 1 MG tablet   Oral   Take 1 mg by mouth 3 (three) times daily.          Marland Kitchen amLODipine (NORVASC) 10 MG tablet   Oral   Take 10 mg by mouth every morning.          Marland Kitchen aspirin 81 MG tablet   Oral   Take 81 mg by mouth every morning.          . carvedilol (COREG) 25 MG tablet   Oral   Take 25 mg by mouth 2 (two) times daily with a meal.           . cholecalciferol (VITAMIN D) 1000 UNITS tablet   Oral   Take 1,000 Units by mouth every morning.          . cloNIDine (CATAPRES) 0.2 MG tablet   Oral   Take 0.2 mg by mouth 3 (three) times daily.           Marland Kitchen lisinopril-hydrochlorothiazide (PRINZIDE,ZESTORETIC) 20-12.5 MG per tablet   Oral   Take 1 tablet by mouth every morning.          . metFORMIN (GLUCOPHAGE) 500 MG tablet   Oral   Take 500 mg by mouth 2 (two) times daily.         . Multiple Vitamin (MULTIVITAMIN) tablet   Oral   Take 1 tablet by mouth every morning.          . Omega-3 Fatty Acids 300 MG CAPS   Oral   Take 300 mg by mouth 3 (three) times daily.          Marland Kitchen oxyCODONE (OXYCONTIN) 20 MG 12 hr tablet   Oral   Take 20 mg by mouth 3 (three) times daily.         . polyethylene glycol (MIRALAX / GLYCOLAX) packet   Oral   Take 17 g by mouth 2 (two) times daily.          . sertraline (ZOLOFT) 100 MG tablet   Oral   Take 100 mg by mouth at bedtime.           Triage Vitals: BP 139/89  Pulse 88  Temp(Src) 98.2 F (36.8 C) (Oral)  Resp 16  Ht 5\' 5"  (1.651 m)  Wt 154 lb (69.854 kg)  BMI 25.63 kg/m2  SpO2 100% Physical Exam CONSTITUTIONAL: Well developed/well nourished, Diffusely tender to right chest wall over pacemaker but no fluctuance SPINE:entire spine nontender HEAD AND FACE: Normocephalic/atraumatic EYES: EOMI/PERRL ENMT: Mucous membranes moist NECK: supple no meningeal signs, Lipoma upper back non tender, Diffuse thoracic and cervical  paraspinal tenderness CV: S1/S2 noted, no murmurs/rubs/gallops noted Chest  Mild tenderness over pacemaker but no fluctuance/erythema/edema noted LUNGS: Lungs are clear to auscultation bilaterally, no apparent distress ABDOMEN: soft, nontender, no rebound  or guarding GU:no cva tenderness NEURO: Pt is awake/alert, moves all extremitiesx4 , no arm or leg drift noted,, no facial droop, numbness along ulnar aspect of right arm, mild decreased in grip of right hand, but no other motor deficits in upper extremities.  EXTREMITIES: pulses normal, full ROM, no edema, erythema, or deformity to any extremity.  SKIN: warm, color normal PSYCH: no abnormalities of mood noted ED Course  Procedures  DIAGNOSTIC STUDIES: Oxygen Saturation is 100% on room air, normal by my interpretation.    COORDINATION OF CARE: At 315 PM Discussed treatment plan with patient which includes cervical spine X-ray, 2 view CXR, EKG. Patient agrees.  Suspect  This may be cervical radiculopathy given neck pain and symptoms.  No signs of CVA.  I doubt ACS.  No signs of pacemaker dysfunction (she actually has interrogation scheduled tomorrow) She is in no distress  MDM  Nursing notes including past medical history and social history reviewed and considered in documentation xrays reviewed and considered Previous records reviewed and considered    Date: 07/18/2012  Rate: 66  Rhythm: normal sinus rhythm  QRS Axis: normal  Intervals: normal  ST/T Wave abnormalities: normal  Conduction Disutrbances:none  Narrative Interpretation:   Old EKG Reviewed: unchanged      I personally performed the services described in this documentation, which was scribed in my presence. The recorded information has been reviewed and is accurate.         Joya Gaskins, MD 07/18/12 [phone]

## 2012-07-18 NOTE — ED Notes (Signed)
States that she was using some new yard equipment 2 weeks ago and may have injured her right lower arm.  States that she started having some numbness in her right forearm, described as a jolt.

## 2012-07-18 NOTE — ED Notes (Addendum)
Pt has had problems with pain at right mid back area that radiates around to right arm, elbow area, numbness to posterior right elbow area, tingling at pacemaker site after pt was pulling on a lawnmower yesterday, has had problems with the back area before. Pt also concerned that a vein appears bigger and painful at times on right posterior forearm area,

## 2012-07-19 ENCOUNTER — Ambulatory Visit (INDEPENDENT_AMBULATORY_CARE_PROVIDER_SITE_OTHER): Payer: Self-pay | Admitting: *Deleted

## 2012-07-19 ENCOUNTER — Telehealth: Payer: Self-pay | Admitting: Internal Medicine

## 2012-07-19 ENCOUNTER — Encounter: Payer: Self-pay | Admitting: Internal Medicine

## 2012-07-19 DIAGNOSIS — Z950 Presence of cardiac pacemaker: Secondary | ICD-10-CM

## 2012-07-19 DIAGNOSIS — I498 Other specified cardiac arrhythmias: Secondary | ICD-10-CM

## 2012-07-19 NOTE — Telephone Encounter (Signed)
New Prob     Requesting a prescription for XANEX. States provider that usually prescribes it went out of business.

## 2012-07-19 NOTE — Telephone Encounter (Signed)
Called patient and let her know Dr Ladona Ridgel does not write for Xanax

## 2012-07-20 LAB — REMOTE PACEMAKER DEVICE
AL AMPLITUDE: 2.8 mv
AL IMPEDENCE PM: 570 Ohm
AL THRESHOLD: 1 V
ATRIAL PACING PM: 41
BAMS-0001: 175 {beats}/min
BATTERY VOLTAGE: 2.8 V
RV LEAD AMPLITUDE: 22.4 mv
RV LEAD IMPEDENCE PM: 721 Ohm
RV LEAD THRESHOLD: 1 V
VENTRICULAR PACING PM: 1

## 2012-07-27 ENCOUNTER — Encounter: Payer: Self-pay | Admitting: *Deleted

## 2012-07-30 ENCOUNTER — Encounter (HOSPITAL_COMMUNITY): Payer: Self-pay

## 2012-07-30 ENCOUNTER — Emergency Department (HOSPITAL_COMMUNITY)
Admission: EM | Admit: 2012-07-30 | Discharge: 2012-07-30 | Disposition: A | Discharge status: Home / Self Care | Payer: Self-pay | Attending: Emergency Medicine | Admitting: Emergency Medicine

## 2012-07-30 DIAGNOSIS — Z8739 Personal history of other diseases of the musculoskeletal system and connective tissue: Secondary | ICD-10-CM | POA: Insufficient documentation

## 2012-07-30 DIAGNOSIS — Z862 Personal history of diseases of the blood and blood-forming organs and certain disorders involving the immune mechanism: Secondary | ICD-10-CM | POA: Insufficient documentation

## 2012-07-30 DIAGNOSIS — F3289 Other specified depressive episodes: Secondary | ICD-10-CM | POA: Insufficient documentation

## 2012-07-30 DIAGNOSIS — Z8679 Personal history of other diseases of the circulatory system: Secondary | ICD-10-CM | POA: Insufficient documentation

## 2012-07-30 DIAGNOSIS — T148XXA Other injury of unspecified body region, initial encounter: Secondary | ICD-10-CM

## 2012-07-30 DIAGNOSIS — I10 Essential (primary) hypertension: Secondary | ICD-10-CM | POA: Insufficient documentation

## 2012-07-30 DIAGNOSIS — Z8719 Personal history of other diseases of the digestive system: Secondary | ICD-10-CM | POA: Insufficient documentation

## 2012-07-30 DIAGNOSIS — Z880 Allergy status to penicillin: Secondary | ICD-10-CM | POA: Insufficient documentation

## 2012-07-30 DIAGNOSIS — F17200 Nicotine dependence, unspecified, uncomplicated: Secondary | ICD-10-CM | POA: Insufficient documentation

## 2012-07-30 DIAGNOSIS — E119 Type 2 diabetes mellitus without complications: Secondary | ICD-10-CM | POA: Insufficient documentation

## 2012-07-30 DIAGNOSIS — J4489 Other specified chronic obstructive pulmonary disease: Secondary | ICD-10-CM | POA: Insufficient documentation

## 2012-07-30 DIAGNOSIS — Z8701 Personal history of pneumonia (recurrent): Secondary | ICD-10-CM | POA: Insufficient documentation

## 2012-07-30 DIAGNOSIS — F329 Major depressive disorder, single episode, unspecified: Secondary | ICD-10-CM | POA: Insufficient documentation

## 2012-07-30 DIAGNOSIS — Z8639 Personal history of other endocrine, nutritional and metabolic disease: Secondary | ICD-10-CM | POA: Insufficient documentation

## 2012-07-30 DIAGNOSIS — F411 Generalized anxiety disorder: Secondary | ICD-10-CM | POA: Insufficient documentation

## 2012-07-30 DIAGNOSIS — Z79899 Other long term (current) drug therapy: Secondary | ICD-10-CM | POA: Insufficient documentation

## 2012-07-30 DIAGNOSIS — Z7982 Long term (current) use of aspirin: Secondary | ICD-10-CM | POA: Insufficient documentation

## 2012-07-30 DIAGNOSIS — M129 Arthropathy, unspecified: Secondary | ICD-10-CM | POA: Insufficient documentation

## 2012-07-30 DIAGNOSIS — M79609 Pain in unspecified limb: Secondary | ICD-10-CM | POA: Insufficient documentation

## 2012-07-30 DIAGNOSIS — J449 Chronic obstructive pulmonary disease, unspecified: Secondary | ICD-10-CM | POA: Insufficient documentation

## 2012-07-30 DIAGNOSIS — K219 Gastro-esophageal reflux disease without esophagitis: Secondary | ICD-10-CM | POA: Insufficient documentation

## 2012-07-30 DIAGNOSIS — M79601 Pain in right arm: Secondary | ICD-10-CM

## 2012-07-30 MED ORDER — PREDNISONE 20 MG PO TABS: ORAL_TABLET | ORAL | Status: DC

## 2012-07-30 MED ORDER — CYCLOBENZAPRINE HCL 5 MG PO TABS: ORAL_TABLET | ORAL | Status: DC

## 2012-07-30 MED ORDER — HYDROCODONE-ACETAMINOPHEN 5-325 MG PO TABS: ORAL_TABLET | ORAL | Status: DC

## 2012-07-30 MED ORDER — CYCLOBENZAPRINE HCL 10 MG PO TABS: 10.0000 mg | ORAL_TABLET | Freq: Once | ORAL | Status: DC

## 2012-07-30 MED ORDER — METHOCARBAMOL 500 MG PO TABS
1000.0000 mg | ORAL_TABLET | Freq: Once | ORAL | Status: AC
  Administered 2012-07-30: 1000 mg via ORAL
  Filled 2012-07-30: qty 2

## 2012-07-30 MED ORDER — DEXAMETHASONE SODIUM PHOSPHATE 4 MG/ML IJ SOLN
10.0000 mg | Freq: Once | INTRAMUSCULAR | Status: AC
  Administered 2012-07-30: 10 mg via INTRAVENOUS
  Filled 2012-07-30: qty 3

## 2012-07-30 MED ORDER — METHOCARBAMOL 500 MG PO TABS: 1000.0000 mg | ORAL_TABLET | Freq: Four times a day (QID) | ORAL | Status: DC | PRN

## 2012-07-30 NOTE — ED Notes (Signed)
I came in on the 29th and they said I had a pinched nerve, seen my MD on July 2nd. There is a knot on the back of my neck and pain goes from my neck down into my back. The pain medication that they gave me has not helped my pain per pt.

## 2012-07-30 NOTE — ED Provider Notes (Addendum)
History    CSN: [id number] Arrival date & time 07/30/12  1623  First MD Initiated Contact with Patient 07/30/12 1925     Chief Complaint  Patient presents with  . Neck Pain   (Consider location/radiation/quality/duration/timing/severity/associated sxs/prior Treatment) HPI Patient reports on June 27 she was riding their new lawnmower and states the gears were very stiff and hard to change. She relates the following day she started getting pain in her right arm and she feels like her right elbow is "pulsating". She states she feels like "the vein" in her right forearm is swollen. She also reports pain in her right upper back and into her right neck. She relates recently she feels like she's got swelling in the right side of her neck. She states her neck pain is worse when she turns her head to the left. She states she has tingling diffusely in her right hand and no finger is worse than the others. She states she is unable to make a fist because of pain in her proximal right forearm. She is right handed. She was seen in the ED on the 29th and had CT of her neck done. She was sent home on Percocet which she states is not helping her pain. She was seen by her PCP on July 2. He told her to try cold and hot compresses. She states the hot compresses makes her pain much worse. She states she was also told her if she continued to have problems to return to the emergency room. PT relates she had phlebitis about 20 years ago in her legs.    PCP Dr Roseanne Reno  Past Medical History  Diagnosis Date  . Anemia   . Hypertension   . Hypothyroidism   . Diabetes mellitus   . Atrial fib/flutter, transient   . Depression   . GERD (gastroesophageal reflux disease)   . Gout   . DJD (degenerative joint disease)   . Diverticulosis   . Internal hemorrhoids   . Anxiety   . Arthritis   . COPD (chronic obstructive pulmonary disease)   . History of pneumonia    Past Surgical History  Procedure Laterality Date  .  Vaginal hysterectomy    . Hernia repair    . Pacemaker insertion    . Tubal ligation     Family History  Problem Relation Age of Onset  . Prostate cancer Father   . Hypertension Mother   . Heart attack Mother   . Heart disease Mother   . Stomach cancer Son   . Diabetes Maternal Aunt   . Heart disease Maternal Uncle   . Colon cancer Neg Hx   . Esophageal cancer Neg Hx   . Rectal cancer Neg Hx    History  Substance Use Topics  . Smoking status: Current Some Day Smoker -- 0.50 packs/day for 30 years    Types: Cigarettes  . Smokeless tobacco: Never Used  . 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    Allergies  Codeine phosphate; Morphine and related; Naproxen; Nsaids; Penicillins; Propoxyphene-acetaminophen; and Sulfamethoxazole w-trimethoprim  Home Medications   Current Outpatient Rx  Name  Route  Sig  Dispense  Refill  .  ALPRAZolam (XANAX) 1 MG tablet   Oral   Take 1 mg by mouth 3 (three) times daily.          Marland Kitchen amLODipine (NORVASC) 10 MG tablet   Oral   Take 10 mg by mouth every morning.          Marland Kitchen aspirin 81 MG tablet   Oral   Take 81 mg by mouth every morning.          . carvedilol (COREG) 25 MG tablet   Oral   Take 25 mg by mouth 2 (two) times daily with a meal.           . cholecalciferol (VITAMIN D) 1000 UNITS tablet   Oral   Take 1,000 Units by mouth every morning.          . cloNIDine (CATAPRES) 0.2 MG tablet   Oral   Take 0.2 mg by mouth 3 (three) times daily.           Marland Kitchen lisinopril-hydrochlorothiazide (PRINZIDE,ZESTORETIC) 20-12.5 MG per tablet   Oral   Take 1 tablet by mouth every morning.          . metFORMIN (GLUCOPHAGE) 500 MG tablet   Oral   Take 500 mg by mouth 2 (two) times daily.         . Multiple Vitamin  (MULTIVITAMIN) tablet   Oral   Take 1 tablet by mouth every morning.          . Omega-3 Fatty Acids 300 MG CAPS   Oral   Take 300 mg by mouth 3 (three) times daily.          Marland Kitchen oxyCODONE-acetaminophen (PERCOCET/ROXICET) 5-325 MG per tablet   Oral   Take 1 tablet by mouth every 4 (four) hours as needed for pain.   5 tablet   0   . polyethylene glycol (MIRALAX / GLYCOLAX) packet   Oral   Take 17 g by mouth 2 (two) times daily.          . sertraline (ZOLOFT) 100 MG tablet   Oral   Take 100 mg by mouth at bedtime.           BP 132/102  Pulse 82  Temp(Src) 98.6 F (37 C) (Oral)  Resp 20  Ht 5\' 5"  (1.651 m)  Wt 161 lb (73.029 kg)  BMI 26.79 kg/m2  SpO2 100%  Vital signs normal   Physical Exam  Nursing note and vitals reviewed. Constitutional: She is oriented to person, place, and time. She appears well-developed and well-nourished.  Non-toxic appearance. She does not appear ill. No distress.  HENT:  Head: Normocephalic and atraumatic.  Right Ear: External ear normal.  Left Ear: External ear normal.  Nose: Nose normal. No mucosal edema or rhinorrhea.  Mouth/Throat: Oropharynx is clear and moist and mucous membranes are normal. No dental abscesses or edematous.  Eyes: Conjunctivae and EOM are normal. Pupils are equal, round, and reactive to light.  Neck: Normal range of motion and full passive range of motion without pain. Neck supple.    Cardiovascular: Normal rate, regular rhythm and normal heart sounds.  Exam reveals no gallop and no friction rub.   No murmur heard. Pulmonary/Chest: Effort normal. No respiratory distress. She has no rhonchi. She exhibits no crepitus.  Pacemaker in right chest  Abdominal: Normal appearance.  Musculoskeletal: Normal range of motion. She exhibits no edema and no tenderness.       Back:  Has pain in her right  elbow without effusion, Good ROM at elbow, wrist. No obvious swelling or cords felt. Weak grip on right b/o pain. No  radial, ulnar, medial nerve weakness.   Neurological: She is alert and oriented to person, place, and time. She has normal strength. No cranial nerve deficit.  Skin: Skin is warm, dry and intact. No rash noted. No erythema. No pallor.  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   Psychiatric: She has a normal mood and affect. Her speech is normal and behavior is normal. Her mood appears not anxious.    ED Course  Procedures (including critical care time)  Medications  cyclobenzaprine (FLEXERIL) tablet 10 mg (not administered)  dexamethasone (DECADRON) injection 10 mg (10 mg Intravenous Given 07/30/12 2042)  methocarbamol (ROBAXIN) tablet 1,000 mg (1,000 mg Oral Given 07/30/12 2042)   Pt states her husband has seen Dr Jeral Fruit. Pt is driving home, her husband has dementia. Pt will return in am for doppler US of her RUE and if negative follow up with Dr Jeral Fruit. She can take the flexeril when she gets home.   Pt now states she can't take flexeril, it gives her nightmares. Given robaxin instead.    07/18/2012 Clinical Data: Right arm pain.  CERVICAL SPINE - COMPLETE 4+ VIEW  Comparison: None.  Findings: The cervical vertebrae are normally aligned. There is no  evidence of trauma or bone destruction. There is mild  intervertebral disc space narrowing at C4-5, C5-6 and C6-7 with  slightly prominent anterior and posterior bony spurs. The caliber  of the canal is normal. Uncovertebral and facet joint spurring is  present at each of these levels. There is mild narrowing of the  left neural foramina at C5-6 and C6-7 and mild narrowing of the  right neural foramen at C4-5, C5-6 and C6-7. The remaining disc  spaces and neural foramina appear normal. No other abnormalities.  IMPRESSION:  No acute findings. Multilevel mild to moderate degenerative  changes.  Original Report Authenticated By: Sander Radon, M.D.   1. Upper extremity pain, diffuse, right   2.  Muscle strain     New Prescriptions   CYCLOBENZAPRINE (FLEXERIL) 5 MG TABLET    Take 1 or 2 po Q 8hrs for sore muscles and pain   HYDROCODONE-ACETAMINOPHEN (NORCO/VICODIN) 5-325 MG PER TABLET    Take 1 or 2 po Q 6hrs for pain   HYDROCODONE-ACETAMINOPHEN (NORCO/VICODIN) 5-325 MG PER TABLET    Take 1 or 2 po Q 6hrs for pain   PREDNISONE (DELTASONE) 20 MG TABLET    Take 3 po QD x 2d starting tomorrow, then 2 po QD x 3d then 1 po QD x 3d   Plan discharge   ,Devoria Albe, MD, FACEP    MDM    Ward Givens, MD 07/30/12 8119  Ward Givens, MD 07/30/12 2124

## 2012-07-30 NOTE — ED Notes (Addendum)
Pt states has continuing pain in neck, shoulder and in rt arm since June 29 th. Pt reports was seen here for same c/o on originating date of injury. Pt also reports has seen her PCP on July 2 nd Without relief. Pt reports has a difficult time sleeping  Radial pulses present and equal bilaterally, cap refill brisk in both hands. Pt does have some noted +1-+2 edema in rt side of neck and upper shoulder.

## 2012-07-30 NOTE — ED Notes (Signed)
Pt states cannot take flexeril secondary to giving her nightmares and insomnia. EDP notified.

## 2012-07-31 ENCOUNTER — Ambulatory Visit (HOSPITAL_COMMUNITY)
Admit: 2012-07-31 | Discharge: 2012-07-31 | Disposition: A | Discharge status: Home / Self Care | Payer: Self-pay | Attending: Emergency Medicine | Admitting: Emergency Medicine

## 2012-07-31 DIAGNOSIS — M79609 Pain in unspecified limb: Secondary | ICD-10-CM | POA: Insufficient documentation

## 2012-07-31 DIAGNOSIS — M7989 Other specified soft tissue disorders: Secondary | ICD-10-CM | POA: Insufficient documentation

## 2012-07-31 DIAGNOSIS — Z86718 Personal history of other venous thrombosis and embolism: Secondary | ICD-10-CM | POA: Insufficient documentation

## 2012-08-09 MED FILL — Hydrocodone-Acetaminophen Tab 5-325 MG: ORAL | Qty: 6 | Status: AC

## 2012-10-25 ENCOUNTER — Encounter: Payer: Self-pay | Admitting: Internal Medicine

## 2012-10-25 ENCOUNTER — Ambulatory Visit (INDEPENDENT_AMBULATORY_CARE_PROVIDER_SITE_OTHER): Payer: Medicaid Other | Admitting: *Deleted

## 2012-10-25 DIAGNOSIS — I498 Other specified cardiac arrhythmias: Secondary | ICD-10-CM

## 2012-10-25 DIAGNOSIS — Z950 Presence of cardiac pacemaker: Secondary | ICD-10-CM

## 2012-10-29 LAB — REMOTE PACEMAKER DEVICE
AL AMPLITUDE: 2.8 mv
AL IMPEDENCE PM: 561 Ohm
AL THRESHOLD: 1 V
ATRIAL PACING PM: 38
BAMS-0001: 175 {beats}/min
BATTERY VOLTAGE: 2.79 V
RV LEAD AMPLITUDE: 22.4 mv
RV LEAD IMPEDENCE PM: 674 Ohm
RV LEAD THRESHOLD: 1 V
VENTRICULAR PACING PM: 1

## 2012-11-02 ENCOUNTER — Encounter: Payer: Self-pay | Admitting: *Deleted

## 2012-12-27 ENCOUNTER — Ambulatory Visit (INDEPENDENT_AMBULATORY_CARE_PROVIDER_SITE_OTHER): Payer: Medicare Other | Admitting: Internal Medicine

## 2012-12-27 ENCOUNTER — Encounter: Payer: Self-pay | Admitting: Internal Medicine

## 2012-12-27 VITALS — BP 139/82 | HR 80 | Ht 65.0 in | Wt 168.0 lb

## 2012-12-27 DIAGNOSIS — E785 Hyperlipidemia, unspecified: Secondary | ICD-10-CM

## 2012-12-27 DIAGNOSIS — Z950 Presence of cardiac pacemaker: Secondary | ICD-10-CM

## 2012-12-27 DIAGNOSIS — I498 Other specified cardiac arrhythmias: Secondary | ICD-10-CM

## 2012-12-27 LAB — MDC_IDC_ENUM_SESS_TYPE_INCLINIC
Battery Impedance: 307 Ohm
Battery Remaining Longevity: 115 mo
Battery Voltage: 2.79 V
Brady Statistic AP VP Percent: 1 %
Brady Statistic AP VS Percent: 34 %
Brady Statistic AS VP Percent: 0 %
Brady Statistic AS VS Percent: 65 %
Date Time Interrogation Session: 20141208125219
Lead Channel Impedance Value: 545 Ohm
Lead Channel Impedance Value: 659 Ohm
Lead Channel Pacing Threshold Amplitude: 0.75 V
Lead Channel Pacing Threshold Amplitude: 1.25 V
Lead Channel Pacing Threshold Pulse Width: 0.4 ms
Lead Channel Pacing Threshold Pulse Width: 0.4 ms
Lead Channel Sensing Intrinsic Amplitude: 1 mV
Lead Channel Sensing Intrinsic Amplitude: 15.68 mV
Lead Channel Setting Pacing Amplitude: 2 V
Lead Channel Setting Pacing Amplitude: 2.5 V
Lead Channel Setting Pacing Pulse Width: 0.4 ms
Lead Channel Setting Sensing Sensitivity: 4 mV

## 2012-12-27 NOTE — Progress Notes (Signed)
HPI [name] returns today for followup. She is a 62 year old woman with a history of symptomatic bradycardia, status post permanent pacemaker insertion. She has ongoing tobacco abuse. She has hypertension and probable peripheral vascular disease. She also has diabetes. In the interim, she has done well. She denies chest pain, shortness of breath, syncope, or peripheral edema. No weight changes recently. No fevers or chills. She has very short lived mode switches, the longest of which was 2 minutes.  Allergies  Allergen Reactions  . Codeine Phosphate Other (See Comments)    REACTION: UNKNOWN  . Morphine And Related Other (See Comments)    Cannot take:Heart problems  . Naproxen Nausea And Vomiting  . Nsaids Nausea Only  . Penicillins     UNKNOWN REACTION  . Propoxyphene-Acetaminophen Other (See Comments)    Makes me gag  . Sulfamethoxazole-Trimethoprim Other (See Comments)    Gi upset  . Flexeril [Cyclobenzaprine] Anxiety and Other (See Comments)    Pt states medication gives her nightmares and insomnia.     Current Outpatient Prescriptions  Medication Sig Dispense Refill  . ALPRAZolam (XANAX) 1 MG tablet Take 1 mg by mouth 3 (three) times daily.       Marland Kitchen amLODipine (NORVASC) 10 MG tablet Take 10 mg by mouth every morning.       Marland Kitchen aspirin 81 MG tablet Take 81 mg by mouth every morning.       . carvedilol (COREG) 25 MG tablet Take 25 mg by mouth 2 (two) times daily with a meal.        . cholecalciferol (VITAMIN D) 1000 UNITS tablet Take 1,000 Units by mouth every morning.       . cloNIDine (CATAPRES) 0.2 MG tablet Take 0.2 mg by mouth 3 (three) times daily.        Marland Kitchen HYDROcodone-acetaminophen (NORCO/VICODIN) 5-325 MG per tablet Take 1 or 2 po Q 6hrs for pain  10 tablet  0  . HYDROcodone-acetaminophen (NORCO/VICODIN) 5-325 MG per tablet Take 1 or 2 po Q 6hrs for pain  6 tablet  0  . lisinopril-hydrochlorothiazide (PRINZIDE,ZESTORETIC) 20-12.5 MG per tablet Take 1 tablet by mouth every  morning.       . metFORMIN (GLUCOPHAGE) 500 MG tablet Take 500 mg by mouth 2 (two) times daily.      . methocarbamol (ROBAXIN) 500 MG tablet Take 2 tablets (1,000 mg total) by mouth 4 (four) times daily as needed (muscle soreness).  80 tablet  0  . Multiple Vitamin (MULTIVITAMIN) tablet Take 1 tablet by mouth every morning.       . Omega-3 Fatty Acids 300 MG CAPS Take 300 mg by mouth 3 (three) times daily.       Marland Kitchen oxyCODONE-acetaminophen (PERCOCET/ROXICET) 5-325 MG per tablet Take 1 tablet by mouth every 4 (four) hours as needed for pain.  5 tablet  0  . polyethylene glycol (MIRALAX / GLYCOLAX) packet Take 17 g by mouth 2 (two) times daily.       . sertraline (ZOLOFT) 100 MG tablet Take 100 mg by mouth at bedtime.        No current facility-administered medications for this visit.     Past Medical History  Diagnosis Date  . Anemia   . Hypertension   . Hypothyroidism   . Diabetes mellitus   . Atrial fib/flutter, transient   . Depression   . GERD (gastroesophageal reflux disease)   . Gout   . DJD (degenerative joint disease)   . Diverticulosis   .  Internal hemorrhoids   . Anxiety   . Arthritis   . COPD (chronic obstructive pulmonary disease)   . History of pneumonia     ROS:   All systems reviewed and negative except as noted in the HPI.   Past Surgical History  Procedure Laterality Date  . Vaginal hysterectomy    . Hernia repair    . Pacemaker insertion    . Tubal ligation       Family History  Problem Relation Age of Onset  . Prostate cancer Father   . Hypertension Mother   . Heart attack Mother   . Heart disease Mother   . Stomach cancer Son   . Diabetes Maternal Aunt   . Heart disease Maternal Uncle   . Colon cancer Neg Hx   . Esophageal cancer Neg Hx   . Rectal cancer Neg Hx      History   Social History  . Marital Status: Married    Spouse Name: N/A    Number of Children: 2  . Years of Education: N/A   Occupational History  . Disabled     Social History Main Topics  . Smoking status: Current Some Day Smoker -- 0.50 packs/day for 30 years    Types: Cigarettes  . Smokeless tobacco: Never Used  . Alcohol Use: No  . Drug Use: No  . Sexual Activity: Yes    Birth Control/ Protection: Surgical   Other Topics Concern  . Not on file   Social History Narrative  . No narrative on file     BP 139/82  Pulse 80  Ht 5\' 5"  (1.651 m)  Wt 168 lb (76.204 kg)  BMI 27.96 kg/m2  Physical Exam:  Well appearing middle-aged woman, NAD HEENT: Unremarkable Neck:  7 cm JVD, no thyromegally Lungs:  Clear with no wheezes, rales, or rhonchi. Well-healed pacemaker incision. HEART:  Regular rate rhythm, no murmurs, no rubs, no clicks Abd:  soft, positive bowel sounds, no organomegally, no rebound, no guarding Ext:  2 plus pulses, no edema, no cyanosis, no clubbing Skin:  No rashes no nodules Neuro:  CN II through XII intact, motor grossly intact  DEVICE  Normal device function.  See PaceArt for details.   Assess/Plan:

## 2012-12-27 NOTE — Assessment & Plan Note (Signed)
Her blood pressure is minimally elevated but she states that at her primary MD's office it has been well controlled. She is encouraged to maintain a low sodium diet.

## 2012-12-27 NOTE — Assessment & Plan Note (Signed)
Her medtronic PM is working normally. Will recheck in several months. 

## 2012-12-27 NOTE — Patient Instructions (Signed)
Your physician recommends that you schedule a follow-up appointment in:  1 year with Dr Court Joy will receive a reminder letter two months in advance reminding you to call and schedule your appointment. If you don't receive this letter, please contact our office.  Care link transmission 03-30-13

## 2012-12-28 ENCOUNTER — Encounter: Payer: Self-pay | Admitting: Internal Medicine

## 2013-01-19 ENCOUNTER — Other Ambulatory Visit (INDEPENDENT_AMBULATORY_CARE_PROVIDER_SITE_OTHER): Payer: Self-pay | Admitting: Otolaryngology

## 2013-01-19 DIAGNOSIS — K118 Other diseases of salivary glands: Secondary | ICD-10-CM

## 2013-01-25 ENCOUNTER — Ambulatory Visit
Admission: RE | Admit: 2013-01-25 | Discharge: 2013-01-25 | Disposition: A | Discharge status: Home / Self Care | Payer: Medicare Other | Source: Ambulatory Visit | Attending: Otolaryngology | Admitting: Otolaryngology

## 2013-01-25 DIAGNOSIS — K118 Other diseases of salivary glands: Secondary | ICD-10-CM

## 2013-01-25 MED ORDER — IOHEXOL 350 MG/ML SOLN
75.0000 mL | Freq: Once | INTRAVENOUS | Status: AC | PRN
  Administered 2013-01-25: 75 mL via INTRAVENOUS

## 2013-03-30 ENCOUNTER — Encounter: Payer: Self-pay | Admitting: Internal Medicine

## 2013-03-30 ENCOUNTER — Ambulatory Visit (INDEPENDENT_AMBULATORY_CARE_PROVIDER_SITE_OTHER): Payer: Medicare Other | Admitting: *Deleted

## 2013-03-30 DIAGNOSIS — I498 Other specified cardiac arrhythmias: Secondary | ICD-10-CM

## 2013-03-30 DIAGNOSIS — Z950 Presence of cardiac pacemaker: Secondary | ICD-10-CM

## 2013-04-01 ENCOUNTER — Encounter (HOSPITAL_COMMUNITY): Payer: Self-pay | Admitting: Emergency Medicine

## 2013-04-01 ENCOUNTER — Emergency Department (INDEPENDENT_AMBULATORY_CARE_PROVIDER_SITE_OTHER)
Admission: EM | Admit: 2013-04-01 | Discharge: 2013-04-01 | Disposition: A | Discharge status: Home / Self Care | Payer: Medicare Other | Source: Home / Self Care

## 2013-04-01 DIAGNOSIS — Z720 Tobacco use: Secondary | ICD-10-CM

## 2013-04-01 DIAGNOSIS — F17200 Nicotine dependence, unspecified, uncomplicated: Secondary | ICD-10-CM

## 2013-04-01 DIAGNOSIS — J9801 Acute bronchospasm: Secondary | ICD-10-CM

## 2013-04-01 DIAGNOSIS — R0609 Other forms of dyspnea: Secondary | ICD-10-CM

## 2013-04-01 DIAGNOSIS — R0989 Other specified symptoms and signs involving the circulatory and respiratory systems: Secondary | ICD-10-CM

## 2013-04-01 DIAGNOSIS — R0600 Dyspnea, unspecified: Secondary | ICD-10-CM

## 2013-04-01 MED ORDER — ALBUTEROL SULFATE HFA 108 (90 BASE) MCG/ACT IN AERS: 2.0000 | INHALATION_SPRAY | Freq: Four times a day (QID) | RESPIRATORY_TRACT | Status: DC | PRN

## 2013-04-01 NOTE — Discharge Instructions (Signed)
Bronchospasm, Adult A bronchospasm is a spasm or tightening of the airways going into the lungs. During a bronchospasm breathing becomes more difficult because the airways get smaller. When this happens there can be coughing, a whistling sound when breathing (wheezing), and difficulty breathing. Bronchospasm is often associated with asthma, but not all patients who experience a bronchospasm have asthma. CAUSES  A bronchospasm is caused by inflammation or irritation of the airways. The inflammation or irritation may be triggered by:   Allergies (such as to animals, pollen, food, or mold). Allergens that cause bronchospasm may cause wheezing immediately after exposure or many hours later.   Infection. Viral infections are believed to be the most common cause of bronchospasm.   Exercise.   Irritants (such as pollution, cigarette smoke, strong odors, aerosol sprays, and paint fumes).   Weather changes. Winds increase molds and pollens in the air. Rain refreshes the air by washing irritants out. Cold air may cause inflammation.   Stress and emotional upset.  SIGNS AND SYMPTOMS   Wheezing.   Excessive nighttime coughing.   Frequent or severe coughing with a simple cold.   Chest tightness.   Shortness of breath.  DIAGNOSIS  Bronchospasm is usually diagnosed through a history and physical exam. Tests, such as chest X-rays, are sometimes done to look for other conditions. TREATMENT   Inhaled medicines can be given to open up your airways and help you breathe. The medicines can be given using either an inhaler or a nebulizer machine.  Corticosteroid medicines may be given for severe bronchospasm, usually when it is associated with asthma. HOME CARE INSTRUCTIONS   Always have a plan prepared for seeking medical care. Know when to call your health care provider and local emergency services (911 in the [place] Know where you can access local emergency care.  Only take medicines as  directed by your health care provider.  If you were prescribed an inhaler or nebulizer machine, ask your health care provider to explain how to use it correctly. Always use a spacer with your inhaler if you were given one.  It is necessary to remain calm during an attack. Try to relax and breathe more slowly.  Control your home environment in the following ways:   Change your heating and air conditioning filter at least once a month.   Limit your use of fireplaces and wood stoves.  Do not smoke and do not allow smoking in your home.   Avoid exposure to perfumes and fragrances.   Get rid of pests (such as roaches and mice) and their droppings.   Throw away plants if you see mold on them.   Keep your house clean and dust free.   Replace carpet with wood, tile, or vinyl flooring. Carpet can trap dander and dust.   Use allergy-proof pillows, mattress covers, and box spring covers.   Wash bed sheets and blankets every week in hot water and dry them in a dryer.   Use blankets that are made of polyester or cotton.   Wash hands frequently. SEEK MEDICAL CARE IF:   You have muscle aches.   You have chest pain.   The sputum changes from clear or white to yellow, green, gray, or bloody.   The sputum you cough up gets thicker.   There are problems that may be related to the medicine you are given, such as a rash, itching, swelling, or trouble breathing.  SEEK IMMEDIATE MEDICAL CARE IF:   You have worsening wheezing and coughing  even after taking your prescribed medicines.   You have increased difficulty breathing.   You develop severe chest pain. MAKE SURE YOU:   Understand these instructions.  Will watch your condition.  Will get help right away if you are not doing well or get worse. Document Released: 01/09/2003 Document Revised: 09/08/2012 Document Reviewed: 06/28/2012 [facility] Patient Information 2014 [place], [place].  How to Use an Inhaler Using  your inhaler correctly is very important. Good technique will make sure that the medicine reaches your lungs.  HOW TO USE AN INHALER: 1. Take the cap off the inhaler. 2. If this is the first time using your inhaler, you need to prime it. Shake the inhaler for 5 seconds. Release four puffs into the air, away from your face. Ask your doctor for help if you have questions. 3. Shake the inhaler for 5 seconds. 4. Turn the inhaler so the bottle is above the mouthpiece. 5. Put your pointer finger on top of the bottle. Your thumb holds the bottom of the inhaler. 6. Open your mouth. 7. Either hold the inhaler away from your mouth (the width of 2 fingers) or place your lips tightly around the mouthpiece. Ask your doctor which way to use your inhaler. 8. Breathe out as much air as possible. 9. Breathe in and push down on the bottle 1 time to release the medicine. You will feel the medicine go in your mouth and throat. 10. Continue to take a deep breath in very slowly. Try to fill your lungs. 11. After you have breathed in completely, hold your breath for 10 seconds. This will help the medicine to settle in your lungs. If you cannot hold your breath for 10 seconds, hold it for as long as you can before you breathe out. 12. Breathe out slowly, through pursed lips. Whistling is an example of pursed lips. 13. If your doctor has told you to take more than 1 puff, wait at least 15 30 seconds between puffs. This will help you get the best results from your medicine. Do not use the inhaler more than your doctor tells you to. 14. Put the cap back on the inhaler. 15. Follow the directions from your doctor or from the inhaler package about cleaning the inhaler. If you use more than one inhaler, ask your doctor which inhalers to use and what order to use them in. Ask your doctor to help you figure out when you will need to refill your inhaler.  If you use a steroid inhaler, always rinse your mouth with water after your  last puff, gargle and spit out the water. Do not swallow the water. GET HELP IF:  The inhaler medicine only partially helps to stop wheezing or shortness of breath.  You are having trouble using your inhaler.  You have some increase in thick spit (phlegm). GET HELP RIGHT AWAY IF:  The inhaler medicine does not help your wheezing or shortness of breath or you have tightness in your chest.  You have dizziness, headaches, or fast heart rate.  You have chills, fever, or night sweats.  You have a large increase of thick spit, or your thick spit is bloody. MAKE SURE YOU:   Understand these instructions.  Will watch your condition.  Will get help right away if you are not doing well or get worse. Document Released: 10/16/2007 Document Revised: 10/27/2012 Document Reviewed: 08/05/2012 [facility] Patient Information 2014 [place], [place].  Shortness of Breath Shortness of breath means you have trouble breathing. Shortness  of breath may indicate that you have a medical problem. You should seek immediate medical care for shortness of breath. CAUSES   Not enough oxygen in the air (as with high altitudes or a smoke-filled room).  Short-term (acute) lung disease, including:  Infections, such as pneumonia.  Fluid in the lungs, such as heart failure.  A blood clot in the lungs (pulmonary embolism).  Long-term (chronic) lung diseases.  Heart disease (heart attack, angina, heart failure, and others).  Low red blood cells (anemia).  Poor physical fitness. This can cause shortness of breath when you exercise.  Chest or back injuries or stiffness.  Being overweight.  Smoking.  Anxiety. This can make you feel like you are not getting enough air. DIAGNOSIS  Serious medical problems can usually be found during your physical exam. Tests may also be done to determine why you are having shortness of breath. Tests may include:  Chest X-rays.  Lung function tests.  Blood  tests.  Electrocardiography.  Exercise testing.  Echocardiography.  Imaging scans. Your caregiver may not be able to find a cause for your shortness of breath after your exam. In this case, it is important to have a follow-up exam with your caregiver as directed.  TREATMENT  Treatment for shortness of breath depends on the cause of your symptoms and can vary greatly. HOME CARE INSTRUCTIONS   Do not smoke. Smoking is a common cause of shortness of breath. If you smoke, ask for help to quit.  Avoid being around chemicals or things that may bother your breathing, such as paint fumes and dust.  Rest as needed. Slowly resume your usual activities.  If medicines were prescribed, take them as directed for the full length of time directed. This includes oxygen and any inhaled medicines.  Keep all follow-up appointments as directed by your caregiver. SEEK MEDICAL CARE IF:   Your condition does not improve in the time expected.  You have a hard time doing your normal activities even with rest.  You have any side effects or problems with the medicines prescribed.  You develop any new symptoms. SEEK IMMEDIATE MEDICAL CARE IF:   Your shortness of breath gets worse.  You feel lightheaded, faint, or develop a cough not controlled with medicines.  You start coughing up blood.  You have pain with breathing.  You have chest pain or pain in your arms, shoulders, or abdomen.  You have a fever.  You are unable to walk up stairs or exercise the way you normally do. MAKE SURE YOU:  Understand these instructions.  Will watch your condition.  Will get help right away if you are not doing well or get worse. Document Released: 10/01/2000 Document Revised: 07/08/2011 Document Reviewed: 03/24/2011 [facility] Patient Information 2014 [place].  Smoking Cessation Quitting smoking is important to your health and has many advantages. However, it is not always easy to quit since nicotine  is a very addictive drug. Often times, people try 3 times or more before being able to quit. This document explains the best ways for you to prepare to quit smoking. Quitting takes hard work and a lot of effort, but you can do it. ADVANTAGES OF QUITTING SMOKING  You will live longer, feel better, and live better.  Your body will feel the impact of quitting smoking almost immediately.  Within 20 minutes, blood pressure decreases. Your pulse returns to its normal level.  After 8 hours, carbon monoxide levels in the blood return to normal. Your oxygen level  increases.  After 24 hours, the chance of having a heart attack starts to decrease. Your breath, hair, and body stop smelling like smoke.  After 48 hours, damaged nerve endings begin to recover. Your sense of taste and smell improve.  After 72 hours, the body is virtually free of nicotine. Your bronchial tubes relax and breathing becomes easier.  After 2 to 12 weeks, lungs can hold more air. Exercise becomes easier and circulation improves.  The risk of having a heart attack, stroke, cancer, or lung disease is greatly reduced.  After 1 year, the risk of coronary heart disease is cut in half.  After 5 years, the risk of stroke falls to the same as a nonsmoker.  After 10 years, the risk of lung cancer is cut in half and the risk of other cancers decreases significantly.  After 15 years, the risk of coronary heart disease drops, usually to the level of a nonsmoker.  If you are pregnant, quitting smoking will improve your chances of having a healthy baby.  The people you live with, especially any children, will be healthier.  You will have extra money to spend on things other than cigarettes. QUESTIONS TO THINK ABOUT BEFORE ATTEMPTING TO QUIT You may want to talk about your answers with your caregiver.  Why do you want to quit?  If you tried to quit in the past, what helped and what did not?  What will be the most difficult  situations for you after you quit? How will you plan to handle them?  Who can help you through the tough times? Your family? Friends? A caregiver?  What pleasures do you get from smoking? What ways can you still get pleasure if you quit? Here are some questions to ask your caregiver:  How can you help me to be successful at quitting?  What medicine do you think would be best for me and how should I take it?  What should I do if I need more help?  What is smoking withdrawal like? How can I get information on withdrawal? GET READY  Set a quit date.  Change your environment by getting rid of all cigarettes, ashtrays, matches, and lighters in your home, car, or work. Do not let people smoke in your home.  Review your past attempts to quit. Think about what worked and what did not. GET SUPPORT AND ENCOURAGEMENT You have a better chance of being successful if you have help. You can get support in many ways.  Tell your family, friends, and co-workers that you are going to quit and need their support. Ask them not to smoke around you.  Get individual, group, or telephone counseling and support. Programs are available at [employer] and health centers. Call your local health department for information about programs in your area.  Spiritual beliefs and practices may help some smokers quit.  Download a "quit meter" on your computer to keep track of quit statistics, such as how long you have gone without smoking, cigarettes not smoked, and money saved.  Get a self-help book about quitting smoking and staying off of tobacco. [place] yourself from urges to smoke. Talk to someone, go for a walk, or occupy your time with a task.  Change your normal routine. Take a different route to work. Drink tea instead of coffee. Eat breakfast in a different place.  Reduce your stress. Take a hot bath, exercise, or read a book.  Plan something enjoyable to  do every  day. Reward yourself for not smoking.  Explore interactive web-based programs that specialize in helping you quit. GET MEDICINE AND USE IT CORRECTLY Medicines can help you stop smoking and decrease the urge to smoke. Combining medicine with the above behavioral methods and support can greatly increase your chances of successfully quitting smoking.  Nicotine replacement therapy helps deliver nicotine to your body without the negative effects and risks of smoking. Nicotine replacement therapy includes nicotine gum, lozenges, inhalers, nasal sprays, and skin patches. Some may be available over-the-counter and others require a prescription.  Antidepressant medicine helps people abstain from smoking, but how this works is unknown. This medicine is available by prescription.  Nicotinic receptor partial agonist medicine simulates the effect of nicotine in your brain. This medicine is available by prescription. Ask your caregiver for advice about which medicines to use and how to use them based on your health history. Your caregiver will tell you what side effects to look out for if you choose to be on a medicine or therapy. Carefully read the information on the package. Do not use any other product containing nicotine while using a nicotine replacement product.  RELAPSE OR DIFFICULT SITUATIONS Most relapses occur within the first 3 months after quitting. Do not be discouraged if you start smoking again. Remember, most people try several times before finally quitting. You may have symptoms of withdrawal because your body is used to nicotine. You may crave cigarettes, be irritable, feel very hungry, cough often, get headaches, or have difficulty concentrating. The withdrawal symptoms are only temporary. They are strongest when you first quit, but they will go away within 10 14 days. To reduce the chances of relapse, try to:  Avoid drinking alcohol. Drinking lowers your chances of successfully  quitting.  Reduce the amount of caffeine you consume. Once you quit smoking, the amount of caffeine in your body increases and can give you symptoms, such as a rapid heartbeat, sweating, and anxiety.  Avoid smokers because they can make you want to smoke.  Do not let weight gain distract you. Many smokers will gain weight when they quit, usually less than 10 pounds. Eat a healthy diet and stay active. You can always lose the weight gained after you quit.  Find ways to improve your mood other than smoking. FOR MORE INFORMATION  www.smokefree.gov  Document Released: 12/31/2000 Document Revised: 07/08/2011 Document Reviewed: 04/17/2011 [facility] Patient Information 2014 [place], [place].

## 2013-04-01 NOTE — ED Notes (Signed)
Patient being seen in the same treatment room as husband.  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

## 2013-04-01 NOTE — ED Notes (Signed)
Patient reports raspy breathing, wheezing that started 2 days ago, worsened last night.

## 2013-04-01 NOTE — ED Provider Notes (Signed)
CSN: [id number]     Arrival date & time 04/01/13  1744 History   First MD Initiated Contact with Patient 04/01/13 1937     Chief Complaint  Patient presents with  . Shortness of Breath   (Consider location/radiation/quality/duration/timing/severity/associated sxs/prior Treatment) HPI Comments: 63 year old female complaining of shortness of breath for about 3 days. It was worse last PM. She is sensing that she is wheezing and not getting enough air into her lungs. She is a 40-pack-year smoker. Denies upper respiratory congestion, fever, chills, chest pain or other associated symptoms. She does not utilize an albuterol a chest x-ray.  Patient is a 63 y.o. female presenting with shortness of breath.  Shortness of Breath Associated symptoms: wheezing   Associated symptoms: no chest pain and no fever     Past Medical History  Diagnosis Date  . Anemia   . Hypertension   . Hypothyroidism   . Diabetes mellitus   . Atrial fib/flutter, transient   . Depression   . GERD (gastroesophageal reflux disease)   . Gout   . DJD (degenerative joint disease)   . Diverticulosis   . Internal hemorrhoids   . Anxiety   . Arthritis   . COPD (chronic obstructive pulmonary disease)   . History of pneumonia    Past Surgical History  Procedure Laterality Date  . Vaginal hysterectomy    . Hernia repair    . Pacemaker insertion    . Tubal ligation     Family History  Problem Relation Age of Onset  . Prostate cancer Father   . Hypertension Mother   . Heart attack Mother   . Heart disease Mother   . Stomach cancer Son   . Diabetes Maternal Aunt   . Heart disease Maternal Uncle   . Colon cancer Neg Hx   . Esophageal cancer Neg Hx   . Rectal cancer Neg Hx    History  Substance Use Topics  . Smoking status: Current Some Day Smoker -- 0.50 packs/day for 30 years    Types: Cigarettes  . Smokeless tobacco: Never Used  . Alcohol Use: No   OB History   Grav Para Term Preterm Abortions TAB SAB Ect  Mult Living   3    1 1    2      Review of Systems  Constitutional: Positive for activity change. Negative for fever and fatigue.  HENT: Positive for voice change.   Respiratory: Positive for shortness of breath and wheezing. Negative for choking.   Cardiovascular: Negative for chest pain, palpitations and leg swelling.  Gastrointestinal: Negative.   Genitourinary: Negative.   Skin: Negative.     Allergies  Codeine phosphate; Morphine and related; Naproxen; Nsaids; Penicillins; Propoxyphene n-acetaminophen; Sulfamethoxazole-trimethoprim; and Flexeril  Home Medications   Current Outpatient Rx  Name  Route  Sig  Dispense  Refill  . ALPRAZolam (XANAX) 1 MG tablet   Oral   Take 1 mg by mouth 3 (three) times daily.          Marland Kitchen amLODipine (NORVASC) 10 MG tablet   Oral   Take 10 mg by mouth every morning.          Marland Kitchen aspirin 81 MG tablet   Oral   Take 81 mg by mouth every morning.          . carvedilol (COREG) 25 MG tablet   Oral   Take 25 mg by mouth 2 (two) times daily with a meal.           .  cholecalciferol (VITAMIN D) 1000 UNITS tablet   Oral   Take 1,000 Units by mouth every morning.          . cloNIDine (CATAPRES) 0.2 MG tablet   Oral   Take 0.2 mg by mouth 3 (three) times daily.           Marland Kitchen lisinopril-hydrochlorothiazide (PRINZIDE,ZESTORETIC) 20-12.5 MG per tablet   Oral   Take 1 tablet by mouth every morning.          . metFORMIN (GLUCOPHAGE) 500 MG tablet   Oral   Take 500 mg by mouth 2 (two) times daily.         Marland Kitchen oxyCODONE-acetaminophen (PERCOCET/ROXICET) 5-325 MG per tablet   Oral   Take 1 tablet by mouth every 4 (four) hours as needed for pain.   5 tablet   0   . sertraline (ZOLOFT) 100 MG tablet   Oral   Take 100 mg by mouth at bedtime.          Marland Kitchen albuterol (PROVENTIL HFA;VENTOLIN HFA) 108 (90 BASE) MCG/ACT inhaler   Inhalation   Inhale 2 puffs into the lungs every 6 (six) hours as needed for wheezing or shortness of breath.   1  Inhaler   0   . HYDROcodone-acetaminophen (NORCO/VICODIN) 5-325 MG per tablet      Take 1 or 2 po Q 6hrs for pain   10 tablet   0   . HYDROcodone-acetaminophen (NORCO/VICODIN) 5-325 MG per tablet      Take 1 or 2 po Q 6hrs for pain   6 tablet   0   . methocarbamol (ROBAXIN) 500 MG tablet   Oral   Take 2 tablets (1,000 mg total) by mouth 4 (four) times daily as needed (muscle soreness).   80 tablet   0   . Multiple Vitamin (MULTIVITAMIN) tablet   Oral   Take 1 tablet by mouth every morning.          . Omega-3 Fatty Acids 300 MG CAPS   Oral   Take 300 mg by mouth 3 (three) times daily.          . polyethylene glycol (MIRALAX / GLYCOLAX) packet   Oral   Take 17 g by mouth 2 (two) times daily.           BP 163/94  Pulse 84  Temp(Src) 97.9 F (36.6 C) (Oral)  Resp 18  SpO2 100% Physical Exam  Nursing note and vitals reviewed. Constitutional: She is oriented to person, place, and time. She appears well-developed and well-nourished. No distress.  HENT:  Mouth/Throat: Oropharynx is clear and moist. No oropharyngeal exudate.  Bilateral TMs are normal  Eyes: Conjunctivae and EOM are normal.  Neck: Normal range of motion. Neck supple.  Cardiovascular: Normal rate, regular rhythm and normal heart sounds.   Pulmonary/Chest: Effort normal. No respiratory distress.  Generalized bilateral decreased air movement. Prolonged expiratory phase. Rare end expiratory wheeze.  Musculoskeletal: She exhibits no edema.  Lymphadenopathy:    She has no cervical adenopathy.  Neurological: She is alert and oriented to person, place, and time.  Skin: Skin is warm and dry.  Psychiatric: She has a normal mood and affect.    ED Course  Procedures (including critical care time) Labs Review Labs Reviewed - No data to display Imaging Review No results found.   MDM   1. Dyspnea   2. Tobacco abuse   3. Bronchospasm      Alburerol HFA as dir Unable  to take steroids due to  hypersensitivity resulting in very high hyperglycemia. F/U with PCP. If worse may return or go to the ED  Janne Napoleon, NP 04/01/13 2013

## 2013-04-02 NOTE — ED Provider Notes (Signed)
Medical screening examination/treatment/procedure(s) were performed by non-physician practitioner and as supervising physician I was immediately available for consultation/collaboration.  Philipp Deputy, M.D.  Harden Mo, MD 04/02/13 [phone]

## 2013-04-06 LAB — MDC_IDC_ENUM_SESS_TYPE_REMOTE
Battery Impedance: 333 Ohm
Battery Remaining Longevity: 114 mo
Battery Voltage: 2.79 V
Brady Statistic AP VP Percent: 0 %
Brady Statistic AP VS Percent: 16 %
Brady Statistic AS VP Percent: 0 %
Brady Statistic AS VS Percent: 84 %
Date Time Interrogation Session: 20150311212859
Lead Channel Impedance Value: 544 Ohm
Lead Channel Impedance Value: 657 Ohm
Lead Channel Pacing Threshold Amplitude: 0.875 V
Lead Channel Pacing Threshold Amplitude: 1 V
Lead Channel Pacing Threshold Pulse Width: 0.4 ms
Lead Channel Pacing Threshold Pulse Width: 0.4 ms
Lead Channel Sensing Intrinsic Amplitude: 0.7 mV
Lead Channel Sensing Intrinsic Amplitude: 8 mV
Lead Channel Setting Pacing Amplitude: 2 V
Lead Channel Setting Pacing Amplitude: 2.5 V
Lead Channel Setting Pacing Pulse Width: 0.4 ms
Lead Channel Setting Sensing Sensitivity: 5.6 mV

## 2013-04-07 ENCOUNTER — Encounter: Payer: Self-pay | Admitting: *Deleted

## 2013-04-11 ENCOUNTER — Encounter: Payer: Self-pay | Admitting: *Deleted

## 2013-07-04 ENCOUNTER — Ambulatory Visit (INDEPENDENT_AMBULATORY_CARE_PROVIDER_SITE_OTHER): Payer: Medicare Other | Admitting: *Deleted

## 2013-07-04 ENCOUNTER — Encounter: Payer: Self-pay | Admitting: Internal Medicine

## 2013-07-04 ENCOUNTER — Telehealth: Payer: Self-pay | Admitting: Cardiology

## 2013-07-04 DIAGNOSIS — Z950 Presence of cardiac pacemaker: Secondary | ICD-10-CM

## 2013-07-04 DIAGNOSIS — I498 Other specified cardiac arrhythmias: Secondary | ICD-10-CM

## 2013-07-04 LAB — MDC_IDC_ENUM_SESS_TYPE_REMOTE
Battery Remaining Longevity: 108 mo
Battery Voltage: 2.79 V
Brady Statistic AP VP Percent: 0.4 %
Brady Statistic AP VS Percent: 19.8 %
Brady Statistic AS VP Percent: 0.4 %
Brady Statistic AS VS Percent: 79.4 %
Lead Channel Impedance Value: 544 Ohm
Lead Channel Impedance Value: 686 Ohm
Lead Channel Pacing Threshold Amplitude: 1 V
Lead Channel Pacing Threshold Amplitude: 1 V
Lead Channel Pacing Threshold Pulse Width: 0.4 ms
Lead Channel Pacing Threshold Pulse Width: 0.4 ms
Lead Channel Sensing Intrinsic Amplitude: 0.7 mV
Lead Channel Sensing Intrinsic Amplitude: 8 mV
Lead Channel Setting Pacing Amplitude: 2 V
Lead Channel Setting Pacing Amplitude: 2.5 V
Lead Channel Setting Pacing Pulse Width: 0.4 ms
Lead Channel Setting Sensing Sensitivity: 5.6 mV

## 2013-07-04 NOTE — Telephone Encounter (Signed)
Spoke with pt and reminded pt of remote transmission that is due today. Pt verbalized understanding.   

## 2013-07-05 NOTE — Progress Notes (Signed)
Remote pacemaker transmission.   

## 2013-07-29 ENCOUNTER — Encounter: Payer: Self-pay | Admitting: Cardiology

## 2013-10-05 ENCOUNTER — Telehealth: Payer: Self-pay | Admitting: Cardiology

## 2013-10-05 ENCOUNTER — Ambulatory Visit (INDEPENDENT_AMBULATORY_CARE_PROVIDER_SITE_OTHER): Payer: Medicare Other | Admitting: *Deleted

## 2013-10-05 DIAGNOSIS — I498 Other specified cardiac arrhythmias: Secondary | ICD-10-CM

## 2013-10-05 NOTE — Telephone Encounter (Signed)
LMOVM reminding pt to send remote transmission.   

## 2013-10-06 ENCOUNTER — Encounter: Payer: Self-pay | Admitting: Cardiovascular Disease

## 2013-10-06 DIAGNOSIS — I498 Other specified cardiac arrhythmias: Secondary | ICD-10-CM

## 2013-10-06 LAB — MDC_IDC_ENUM_SESS_TYPE_REMOTE
Battery Impedance: 407 Ohm
Battery Remaining Longevity: 107 mo
Battery Voltage: 2.79 V
Brady Statistic AP VP Percent: 0 %
Brady Statistic AP VS Percent: 21 %
Brady Statistic AS VP Percent: 0 %
Brady Statistic AS VS Percent: 78 %
Date Time Interrogation Session: 20150917144343
Lead Channel Impedance Value: 544 Ohm
Lead Channel Impedance Value: 631 Ohm
Lead Channel Pacing Threshold Amplitude: 1 V
Lead Channel Pacing Threshold Amplitude: 1.125 V
Lead Channel Pacing Threshold Pulse Width: 0.4 ms
Lead Channel Pacing Threshold Pulse Width: 0.4 ms
Lead Channel Sensing Intrinsic Amplitude: 0.7 mV
Lead Channel Sensing Intrinsic Amplitude: 8 mV
Lead Channel Setting Pacing Amplitude: 2 V
Lead Channel Setting Pacing Amplitude: 2.5 V
Lead Channel Setting Pacing Pulse Width: 0.4 ms
Lead Channel Setting Sensing Sensitivity: 4 mV

## 2013-10-06 NOTE — Progress Notes (Signed)
Remote pacemaker transmission.   

## 2013-10-11 ENCOUNTER — Encounter: Payer: Self-pay | Admitting: Cardiology

## 2013-11-21 ENCOUNTER — Encounter (HOSPITAL_COMMUNITY): Payer: Self-pay | Admitting: Emergency Medicine

## 2013-11-28 ENCOUNTER — Other Ambulatory Visit (HOSPITAL_COMMUNITY): Payer: Self-pay | Admitting: Internal Medicine

## 2013-11-28 DIAGNOSIS — Z1231 Encounter for screening mammogram for malignant neoplasm of breast: Secondary | ICD-10-CM

## 2013-12-08 ENCOUNTER — Ambulatory Visit (HOSPITAL_COMMUNITY): Payer: Medicare Other

## 2013-12-21 ENCOUNTER — Ambulatory Visit (HOSPITAL_COMMUNITY): Payer: Medicare Other

## 2013-12-27 ENCOUNTER — Encounter: Payer: Medicare Other | Admitting: Internal Medicine

## 2013-12-28 ENCOUNTER — Encounter: Payer: Self-pay | Admitting: Internal Medicine

## 2014-03-21 ENCOUNTER — Ambulatory Visit (INDEPENDENT_AMBULATORY_CARE_PROVIDER_SITE_OTHER): Payer: Medicare Other | Admitting: Internal Medicine

## 2014-03-21 ENCOUNTER — Encounter: Payer: Self-pay | Admitting: Internal Medicine

## 2014-03-21 VITALS — BP 140/80 | HR 85 | Ht 65.0 in | Wt 158.1 lb

## 2014-03-21 DIAGNOSIS — Z950 Presence of cardiac pacemaker: Secondary | ICD-10-CM

## 2014-03-21 DIAGNOSIS — I480 Paroxysmal atrial fibrillation: Secondary | ICD-10-CM

## 2014-03-21 DIAGNOSIS — I482 Chronic atrial fibrillation, unspecified: Secondary | ICD-10-CM | POA: Insufficient documentation

## 2014-03-21 DIAGNOSIS — R001 Bradycardia, unspecified: Secondary | ICD-10-CM

## 2014-03-21 DIAGNOSIS — I4891 Unspecified atrial fibrillation: Secondary | ICD-10-CM | POA: Insufficient documentation

## 2014-03-21 DIAGNOSIS — I10 Essential (primary) hypertension: Secondary | ICD-10-CM

## 2014-03-21 LAB — MDC_IDC_ENUM_SESS_TYPE_INCLINIC
Battery Impedance: 382 Ohm
Battery Remaining Longevity: 109 mo
Battery Voltage: 2.79 V
Brady Statistic AP VP Percent: 1 %
Brady Statistic AP VS Percent: 23 %
Brady Statistic AS VP Percent: 1 %
Brady Statistic AS VS Percent: 76 %
Date Time Interrogation Session: 20160301144106
Lead Channel Impedance Value: 536 Ohm
Lead Channel Impedance Value: 636 Ohm
Lead Channel Pacing Threshold Amplitude: 0.75 V
Lead Channel Pacing Threshold Amplitude: 1 V
Lead Channel Pacing Threshold Pulse Width: 0.4 ms
Lead Channel Pacing Threshold Pulse Width: 0.4 ms
Lead Channel Sensing Intrinsic Amplitude: 1.4 mV
Lead Channel Sensing Intrinsic Amplitude: 11.2 mV
Lead Channel Setting Pacing Amplitude: 2 V
Lead Channel Setting Pacing Amplitude: 2.5 V
Lead Channel Setting Pacing Pulse Width: 0.4 ms
Lead Channel Setting Sensing Sensitivity: 4 mV

## 2014-03-21 NOTE — Progress Notes (Signed)
HPI [name] returns today for followup. She is a 64 year old woman with a history of symptomatic bradycardia, status post permanent pacemaker insertion. She has ongoing tobacco abuse. She has hypertension and probable peripheral vascular disease. She also has diabetes. In the interim, she has done well. She denies chest pain, shortness of breath, syncope, or peripheral edema. No weight changes recently. No fevers or chills. She has very short lived mode switches, the longest of which was 3 minutes.  Allergies  Allergen Reactions  . Codeine Phosphate Other (See Comments)    REACTION: UNKNOWN  . Morphine And Related Other (See Comments)    Cannot take:Heart problems  . Naproxen Nausea And Vomiting  . Nsaids Nausea Only  . Penicillins     UNKNOWN REACTION  . Propoxyphene N-Acetaminophen Other (See Comments)    Makes me gag  . Sulfamethoxazole-Trimethoprim Other (See Comments)    Gi upset  . Flexeril [Cyclobenzaprine] Anxiety and Other (See Comments)    Pt states medication gives her nightmares and insomnia.     Current Outpatient Prescriptions  Medication Sig Dispense Refill  . albuterol (PROVENTIL HFA;VENTOLIN HFA) 108 (90 BASE) MCG/ACT inhaler Inhale 2 puffs into the lungs every 6 (six) hours as needed for wheezing or shortness of breath. 1 Inhaler 0  . ALPRAZolam (XANAX) 1 MG tablet Take 1 mg by mouth 3 (three) times daily.     Marland Kitchen amLODipine (NORVASC) 10 MG tablet Take 10 mg by mouth every morning.     Marland Kitchen aspirin 81 MG tablet Take 81 mg by mouth every morning.     . carvedilol (COREG) 25 MG tablet Take 25 mg by mouth 2 (two) times daily with a meal.      . cholecalciferol (VITAMIN D) 1000 UNITS tablet Take 1,000 Units by mouth every morning.     . cloNIDine (CATAPRES) 0.2 MG tablet Take 0.2 mg by mouth 3 (three) times daily.      Marland Kitchen HYDROcodone-acetaminophen (NORCO/VICODIN) 5-325 MG per tablet Take 1 or 2 po Q 6hrs for pain (Patient taking differently: Take 1-2 tablets by mouth every 6  hours as needed for pain) 10 tablet 0  . lisinopril-hydrochlorothiazide (PRINZIDE,ZESTORETIC) 20-12.5 MG per tablet Take 1 tablet by mouth every morning.     . metFORMIN (GLUCOPHAGE) 500 MG tablet Take 500 mg by mouth 2 (two) times daily.    . Multiple Vitamin (MULTIVITAMIN) tablet Take 1 tablet by mouth every morning.     . Omega-3 Fatty Acids 300 MG CAPS Take 300 mg by mouth 3 (three) times daily.     Marland Kitchen oxyCODONE-acetaminophen (PERCOCET/ROXICET) 5-325 MG per tablet Take 1 tablet by mouth every 4 (four) hours as needed for pain. 5 tablet 0  . polyethylene glycol (MIRALAX / GLYCOLAX) packet Take 17 g by mouth 2 (two) times daily.     . sertraline (ZOLOFT) 100 MG tablet Take 100 mg by mouth at bedtime.      No current facility-administered medications for this visit.     Past Medical History  Diagnosis Date  . Anemia   . Hypertension   . Hypothyroidism   . Diabetes mellitus   . Atrial fib/flutter, transient   . Depression   . GERD (gastroesophageal reflux disease)   . Gout   . DJD (degenerative joint disease)   . Diverticulosis   . Internal hemorrhoids   . Anxiety   . Arthritis   . COPD (chronic obstructive pulmonary disease)   . History of pneumonia     ROS:  All systems reviewed and negative except as noted in the HPI.   Past Surgical History  Procedure Laterality Date  . Vaginal hysterectomy    . Hernia repair    . Pacemaker insertion    . Tubal ligation       Family History  Problem Relation Age of Onset  . Prostate cancer Father   . Hypertension Mother   . Heart attack Mother   . Heart disease Mother   . Stomach cancer Son   . Diabetes Maternal Aunt   . Heart disease Maternal Uncle   . Colon cancer Neg Hx   . Esophageal cancer Neg Hx   . Rectal cancer Neg Hx      History   Social History  . Marital Status: Married    Spouse Name: N/A  . Number of Children: 2  . Years of Education: N/A   Occupational History  . Disabled    Social History  Main Topics  . Smoking status: Current Some Day Smoker -- 0.50 packs/day for 30 years    Types: Cigarettes  . Smokeless tobacco: Never Used  . Alcohol Use: No  . Drug Use: No  . Sexual Activity: Yes    Birth Control/ Protection: Surgical   Other Topics Concern  . Not on file   Social History Narrative     BP 140/80 mmHg  Pulse 85  Ht 5\' 5"  (1.651 m)  Wt 158 lb 1.9 oz (71.723 kg)  BMI 26.31 kg/m2  Physical Exam:  Well appearing middle-aged woman, NAD HEENT: Unremarkable Neck:  7 cm JVD, no thyromegally Lungs:  Clear with no wheezes, rales, or rhonchi. Well-healed pacemaker incision. HEART:  Regular rate rhythm, no murmurs, no rubs, no clicks Abd:  soft, positive bowel sounds, no organomegally, no rebound, no guarding Ext:  2 plus pulses, no edema, no cyanosis, no clubbing Skin:  No rashes no nodules Neuro:  CN II through XII intact, motor grossly intact  DEVICE  Normal device function.  See PaceArt for details.   Assess/Plan:

## 2014-03-21 NOTE — Assessment & Plan Note (Signed)
Her Medtronic dual-chamber pacemaker is working normally. We'll plan to recheck in several months. 

## 2014-03-21 NOTE — Patient Instructions (Signed)
Your physician wants you to follow-up in: 12 months with Dr Knox Saliva will receive a reminder letter in the mail two months in advance. If you don't receive a letter, please call our office to schedule the follow-up appointment.   Remote monitoring is used to monitor your Pacemaker or ICD from home. This monitoring reduces the number of office visits required to check your device to one time per year. It allows [place] to keep an eye on the functioning of your device to ensure it is working properly. You are scheduled for a device check from home on 06/20/14. You may send your transmission at any time that day. If you have a wireless device, the transmission will be sent automatically. After your physician reviews your transmission, you will receive a postcard with your next transmission date.

## 2014-03-21 NOTE — Assessment & Plan Note (Signed)
Her blood pressure is reasonably well controlled. She will continue her current medications. I've asked the patient to reduce her salt intake.

## 2014-03-21 NOTE — Assessment & Plan Note (Signed)
The patient has risk factors for stroke. She is asymptomatic. She has had no more than 3 minutes of atrial fibrillation at a time according to her pacemaker. I recommended watchful waiting. At some point, she will have longer durations of atrial fibrillation and we will start her on systemic anticoagulation. The real question is how much is too much before she will need anticoagulation. If she has more than an hour at one time, I would be inclined to treat her with systemic anticoagulation.

## 2014-03-31 ENCOUNTER — Encounter: Payer: Self-pay | Admitting: Internal Medicine

## 2014-05-20 ENCOUNTER — Emergency Department (HOSPITAL_COMMUNITY)
Admission: EM | Admit: 2014-05-20 | Discharge: 2014-05-20 | Disposition: A | Discharge status: Home / Self Care | Payer: Medicare Other | Attending: Emergency Medicine | Admitting: Emergency Medicine

## 2014-05-20 ENCOUNTER — Encounter (HOSPITAL_COMMUNITY): Payer: Self-pay | Admitting: Emergency Medicine

## 2014-05-20 DIAGNOSIS — I10 Essential (primary) hypertension: Secondary | ICD-10-CM | POA: Diagnosis not present

## 2014-05-20 DIAGNOSIS — M109 Gout, unspecified: Secondary | ICD-10-CM | POA: Diagnosis not present

## 2014-05-20 DIAGNOSIS — J449 Chronic obstructive pulmonary disease, unspecified: Secondary | ICD-10-CM | POA: Insufficient documentation

## 2014-05-20 DIAGNOSIS — F329 Major depressive disorder, single episode, unspecified: Secondary | ICD-10-CM | POA: Insufficient documentation

## 2014-05-20 DIAGNOSIS — M1990 Unspecified osteoarthritis, unspecified site: Secondary | ICD-10-CM | POA: Insufficient documentation

## 2014-05-20 DIAGNOSIS — Z8719 Personal history of other diseases of the digestive system: Secondary | ICD-10-CM | POA: Diagnosis not present

## 2014-05-20 DIAGNOSIS — E039 Hypothyroidism, unspecified: Secondary | ICD-10-CM | POA: Insufficient documentation

## 2014-05-20 DIAGNOSIS — Z8701 Personal history of pneumonia (recurrent): Secondary | ICD-10-CM | POA: Insufficient documentation

## 2014-05-20 DIAGNOSIS — F419 Anxiety disorder, unspecified: Secondary | ICD-10-CM | POA: Insufficient documentation

## 2014-05-20 DIAGNOSIS — E119 Type 2 diabetes mellitus without complications: Secondary | ICD-10-CM | POA: Diagnosis not present

## 2014-05-20 DIAGNOSIS — Z79899 Other long term (current) drug therapy: Secondary | ICD-10-CM | POA: Insufficient documentation

## 2014-05-20 DIAGNOSIS — R21 Rash and other nonspecific skin eruption: Secondary | ICD-10-CM | POA: Diagnosis not present

## 2014-05-20 DIAGNOSIS — Z862 Personal history of diseases of the blood and blood-forming organs and certain disorders involving the immune mechanism: Secondary | ICD-10-CM | POA: Diagnosis not present

## 2014-05-20 DIAGNOSIS — Z950 Presence of cardiac pacemaker: Secondary | ICD-10-CM | POA: Diagnosis not present

## 2014-05-20 DIAGNOSIS — Z880 Allergy status to penicillin: Secondary | ICD-10-CM | POA: Diagnosis not present

## 2014-05-20 DIAGNOSIS — Z7982 Long term (current) use of aspirin: Secondary | ICD-10-CM | POA: Insufficient documentation

## 2014-05-20 MED ORDER — CETIRIZINE HCL 10 MG PO CAPS: 10.0000 mg | ORAL_CAPSULE | Freq: Every day | ORAL | Status: DC

## 2014-05-20 NOTE — ED Notes (Signed)
Patient c/o rash and itching to entire body. Per patient was seen by PCP and told dust mites. Patient states did not improve, so 2 weeks ago went to [facility] and told bed bugs. Patient given cream. Per patient no improvement. Patient states she has had to exterminators to the house and both told here it was not bed bugs or dust mites, that it was environmental (spores.) Husband also here to be seen for same reason without rash.

## 2014-05-20 NOTE — Discharge Instructions (Signed)

## 2014-05-21 NOTE — ED Provider Notes (Signed)
CSN: [id number]     Arrival date & time 05/20/14  25 History   First MD Initiated Contact with Patient 05/20/14 1935     Chief Complaint  Patient presents with  . Rash     (Consider location/radiation/quality/duration/timing/severity/associated sxs/prior Treatment) The history is provided by the patient and the spouse.  Both husband and wife presenting for assistance with rash and itching, [name] denies rash unlike wife, but has had increased generalized itching for the past 3-4 weeks. Mrs Conaway has rash predominantly on her back, but with scattered lesions on extremities as well.  Seen by pcp one month ago and told it was possibly due to dust mites as wife explains the dryer vent came loose under their home and describes increased dust and floating white objects in the home which she believes to be insects.  Itching and rash persists  despite having exterminator in home twice (unable to find insect infestation including bed bugs but was concerned about "envirnmental spores in the home.)  They deny any new exposures to foods, skin or cleansing products.  They have both tried benadryl and atarax without relief of itching.  They have an appt with pcp in 4 days for recheck of this problem. No fevers, chills, swelling, wheezing, cough.    Past Medical History  Diagnosis Date  . Anemia   . Hypertension   . Hypothyroidism   . Diabetes mellitus   . Atrial fib/flutter, transient   . Depression   . GERD (gastroesophageal reflux disease)   . Gout   . DJD (degenerative joint disease)   . Diverticulosis   . Internal hemorrhoids   . Anxiety   . Arthritis   . COPD (chronic obstructive pulmonary disease)   . History of pneumonia    Past Surgical History  Procedure Laterality Date  . Vaginal hysterectomy    . Hernia repair    . Pacemaker insertion    . Tubal ligation     Family History  Problem Relation Age of Onset  . Prostate cancer Father   . Hypertension Mother   . Heart attack  Mother   . Heart disease Mother   . Stomach cancer Son   . Diabetes Maternal Aunt   . Heart disease Maternal Uncle   . Colon cancer Neg Hx   . Esophageal cancer Neg Hx   . Rectal cancer Neg Hx    History  Substance Use Topics  . Smoking status: Current Some Day Smoker -- 0.50 packs/day for 30 years    Types: Cigarettes  . Smokeless tobacco: Never Used  . Alcohol Use: No   OB History    Gravida Para Term Preterm AB TAB SAB Ectopic Multiple Living   3    1 1    2      Review of Systems  Constitutional: Negative for fever and chills.  Respiratory: Negative for shortness of breath and wheezing.   Skin: Positive for rash.  Neurological: Negative for numbness.      Allergies  Codeine phosphate; Morphine and related; Naproxen; Nsaids; Penicillins; Propoxyphene n-acetaminophen; Sulfamethoxazole-trimethoprim; and Flexeril  Home Medications   Prior to Admission medications   Medication Sig Start Date End Date Taking? Authorizing Provider  albuterol (PROVENTIL HFA;VENTOLIN HFA) 108 (90 BASE) MCG/ACT inhaler Inhale 2 puffs into the lungs every 6 (six) hours as needed for wheezing or shortness of breath. 04/01/13  Yes Janne Napoleon, NP  ALPRAZolam Duanne Moron) 1 MG tablet Take 1 mg by mouth 3 (three) times daily.  Yes Historical Provider, MD  amLODipine (NORVASC) 10 MG tablet Take 10 mg by mouth every morning.    Yes Historical Provider, MD  aspirin 81 MG tablet Take 81 mg by mouth every morning.    Yes Historical Provider, MD  carvedilol (COREG) 25 MG tablet Take 25 mg by mouth 2 (two) times daily with a meal.     Yes Historical Provider, MD  cholecalciferol (VITAMIN D) 1000 UNITS tablet Take 1,000 Units by mouth every morning.    Yes Historical Provider, MD  cloNIDine (CATAPRES) 0.2 MG tablet Take 0.2 mg by mouth 3 (three) times daily.     Yes Historical Provider, MD  hydrALAZINE (APRESOLINE) 50 MG tablet Take 50 mg by mouth 2 (two) times daily.   Yes Historical Provider, MD  hydrocortisone  (CORTEF) 10 MG tablet Take 10 mg by mouth 3 (three) times daily.   Yes Historical Provider, MD  levothyroxine (SYNTHROID, LEVOTHROID) 150 MCG tablet Take 150 mcg by mouth daily before breakfast.   Yes Historical Provider, MD  lisinopril-hydrochlorothiazide (PRINZIDE,ZESTORETIC) 20-12.5 MG per tablet Take 1 tablet by mouth every morning.    Yes Historical Provider, MD  lovastatin (MEVACOR) 20 MG tablet Take 20 mg by mouth at bedtime.   Yes Historical Provider, MD  metFORMIN (GLUCOPHAGE) 500 MG tablet Take 500 mg by mouth 2 (two) times daily.   Yes Historical Provider, MD  Multiple Vitamin (MULTIVITAMIN) tablet Take 1 tablet by mouth every morning.    Yes Historical Provider, MD  Omega-3 Fatty Acids 300 MG CAPS Take 300 mg by mouth 3 (three) times daily.    Yes Historical Provider, MD  polyethylene glycol (MIRALAX / GLYCOLAX) packet Take 17 g by mouth 2 (two) times daily.    Yes Historical Provider, MD  sertraline (ZOLOFT) 100 MG tablet Take 50 mg by mouth at bedtime.    Yes Historical Provider, MD  Cetirizine HCl (ZYRTEC ALLERGY) 10 MG CAPS Take 1 capsule (10 mg total) by mouth daily. 05/20/14   Evalee Jefferson, PA-C  HYDROcodone-acetaminophen (NORCO/VICODIN) 5-325 MG per tablet Take 1 or 2 po Q 6hrs for pain Patient not taking: Reported on 05/20/2014 07/30/12   Rolland Porter, MD  oxyCODONE-acetaminophen (PERCOCET/ROXICET) 5-325 MG per tablet Take 1 tablet by mouth every 4 (four) hours as needed for pain. Patient not taking: Reported on 05/20/2014 07/18/12   Ripley Fraise, MD   BP 139/80 mmHg  Pulse 71  Temp(Src) 98.7 F (37.1 C) (Oral)  Resp 20  Ht 5\' 5"  (1.651 m)  Wt 152 lb (68.947 kg)  BMI 25.29 kg/m2  SpO2 100% Physical Exam  Constitutional: She appears well-developed and well-nourished. No distress.  HENT:  Head: Normocephalic.  Neck: Neck supple.  Cardiovascular: Normal rate.   Pulmonary/Chest: Effort normal. She has no wheezes.  Musculoskeletal: Normal range of motion. She exhibits no edema.   Skin: Rash noted. Rash is papular. No erythema.  Scattered denuded papules upper back, lower legs, few on arms.  No drainage or surrounding erythema.      ED Course  Procedures (including critical care time) Labs Review Labs Reviewed - No data to display  Imaging Review No results found.   EKG Interpretation None      MDM   Final diagnoses:  Rash and nonspecific skin eruption    Suggested prednisone taper for itch and suspected atopic dermatitis. Pt defers stating cannot tolerate.  Zyrtec prescribed. Plan f/u with pcp in 4 days as planned.  The patient appears reasonably screened and/or stabilized for  discharge and I doubt any other medical condition or other [facility] requiring further screening, evaluation, or treatment in the ED at this time prior to discharge.     Evalee Jefferson, PA-C 05/21/14 1347  Nat Christen, MD 05/21/14 [phone]

## 2014-06-20 ENCOUNTER — Telehealth: Payer: Self-pay | Admitting: Cardiology

## 2014-06-20 ENCOUNTER — Encounter: Payer: Medicare Other | Admitting: *Deleted

## 2014-06-20 NOTE — Telephone Encounter (Signed)
LMOVM reminding pt to send remote transmission.   

## 2014-06-23 ENCOUNTER — Encounter: Payer: Self-pay | Admitting: Cardiology

## 2014-11-07 ENCOUNTER — Encounter: Payer: Self-pay | Admitting: *Deleted

## 2015-02-21 ENCOUNTER — Other Ambulatory Visit: Payer: Self-pay | Admitting: Internal Medicine

## 2015-02-21 DIAGNOSIS — Z1231 Encounter for screening mammogram for malignant neoplasm of breast: Secondary | ICD-10-CM

## 2015-04-23 ENCOUNTER — Ambulatory Visit: Payer: Medicare Other

## 2015-04-24 ENCOUNTER — Ambulatory Visit
Admission: RE | Admit: 2015-04-24 | Discharge: 2015-04-24 | Disposition: A | Discharge status: Home / Self Care | Payer: Medicare HMO | Source: Ambulatory Visit | Attending: Internal Medicine | Admitting: Internal Medicine

## 2015-04-24 DIAGNOSIS — Z1231 Encounter for screening mammogram for malignant neoplasm of breast: Secondary | ICD-10-CM

## 2015-09-17 ENCOUNTER — Other Ambulatory Visit (HOSPITAL_COMMUNITY): Payer: Self-pay | Admitting: Internal Medicine

## 2015-09-17 DIAGNOSIS — M79606 Pain in leg, unspecified: Secondary | ICD-10-CM

## 2015-09-17 DIAGNOSIS — L0390 Cellulitis, unspecified: Secondary | ICD-10-CM

## 2015-09-18 ENCOUNTER — Ambulatory Visit (HOSPITAL_COMMUNITY)
Admission: RE | Admit: 2015-09-18 | Discharge: 2015-09-18 | Disposition: A | Discharge status: Home / Self Care | Payer: Medicare HMO | Source: Ambulatory Visit | Attending: Internal Medicine | Admitting: Internal Medicine

## 2015-09-18 DIAGNOSIS — L0390 Cellulitis, unspecified: Secondary | ICD-10-CM | POA: Diagnosis present

## 2015-09-18 DIAGNOSIS — M79609 Pain in unspecified limb: Secondary | ICD-10-CM | POA: Insufficient documentation

## 2015-09-18 DIAGNOSIS — M79606 Pain in leg, unspecified: Secondary | ICD-10-CM

## 2016-04-03 ENCOUNTER — Other Ambulatory Visit: Payer: Self-pay | Admitting: Internal Medicine

## 2016-04-03 DIAGNOSIS — Z1231 Encounter for screening mammogram for malignant neoplasm of breast: Secondary | ICD-10-CM

## 2016-05-25 ENCOUNTER — Encounter (HOSPITAL_COMMUNITY): Payer: Self-pay | Admitting: *Deleted

## 2016-05-25 ENCOUNTER — Emergency Department (HOSPITAL_COMMUNITY)
Admission: EM | Admit: 2016-05-25 | Discharge: 2016-05-25 | Disposition: A | Discharge status: Home / Self Care | Payer: Medicare Other | Attending: Emergency Medicine | Admitting: Emergency Medicine

## 2016-05-25 DIAGNOSIS — J449 Chronic obstructive pulmonary disease, unspecified: Secondary | ICD-10-CM | POA: Diagnosis not present

## 2016-05-25 DIAGNOSIS — T783XXA Angioneurotic edema, initial encounter: Secondary | ICD-10-CM

## 2016-05-25 DIAGNOSIS — E039 Hypothyroidism, unspecified: Secondary | ICD-10-CM | POA: Insufficient documentation

## 2016-05-25 DIAGNOSIS — Z7982 Long term (current) use of aspirin: Secondary | ICD-10-CM | POA: Insufficient documentation

## 2016-05-25 DIAGNOSIS — F17210 Nicotine dependence, cigarettes, uncomplicated: Secondary | ICD-10-CM | POA: Diagnosis not present

## 2016-05-25 DIAGNOSIS — Z7984 Long term (current) use of oral hypoglycemic drugs: Secondary | ICD-10-CM | POA: Insufficient documentation

## 2016-05-25 DIAGNOSIS — I10 Essential (primary) hypertension: Secondary | ICD-10-CM | POA: Insufficient documentation

## 2016-05-25 DIAGNOSIS — T7840XA Allergy, unspecified, initial encounter: Secondary | ICD-10-CM | POA: Diagnosis present

## 2016-05-25 DIAGNOSIS — E119 Type 2 diabetes mellitus without complications: Secondary | ICD-10-CM | POA: Diagnosis not present

## 2016-05-25 LAB — CBC WITH DIFFERENTIAL/PLATELET
Basophils Absolute: 0 10*3/uL (ref 0.0–0.1)
Basophils Relative: 0 %
Eosinophils Absolute: 0.3 10*3/uL (ref 0.0–0.7)
Eosinophils Relative: 3 %
HCT: 37.8 % (ref 36.0–46.0)
Hemoglobin: 12.3 g/dL (ref 12.0–15.0)
Lymphocytes Relative: 29 %
Lymphs Abs: 2.6 10*3/uL (ref 0.7–4.0)
MCH: 29.4 pg (ref 26.0–34.0)
MCHC: 32.5 g/dL (ref 30.0–36.0)
MCV: 90.4 fL (ref 78.0–100.0)
Monocytes Absolute: 0.7 10*3/uL (ref 0.1–1.0)
Monocytes Relative: 8 %
Neutro Abs: 5.4 10*3/uL (ref 1.7–7.7)
Neutrophils Relative %: 60 %
Platelets: 245 10*3/uL (ref 150–400)
RBC: 4.18 MIL/uL (ref 3.87–5.11)
RDW: 17.4 % — ABNORMAL HIGH (ref 11.5–15.5)
WBC: 8.9 10*3/uL (ref 4.0–10.5)

## 2016-05-25 LAB — BASIC METABOLIC PANEL
Anion gap: 7 (ref 5–15)
BUN: 25 mg/dL — ABNORMAL HIGH (ref 6–20)
CO2: 30 mmol/L (ref 22–32)
Calcium: 9.1 mg/dL (ref 8.9–10.3)
Chloride: 104 mmol/L (ref 101–111)
Creatinine, Ser: 0.97 mg/dL (ref 0.44–1.00)
GFR calc Af Amer: >60 mL/min (ref >60)
GFR calc non Af Amer: 60 mL/min — ABNORMAL LOW (ref >60)
Glucose, Bld: 92 mg/dL (ref 65–99)
Potassium: 3.2 mmol/L — ABNORMAL LOW (ref 3.5–5.1)
Sodium: 141 mmol/L (ref 135–145)

## 2016-05-25 MED ORDER — HYDROCHLOROTHIAZIDE 12.5 MG PO TABS: 12.5000 mg | ORAL_TABLET | Freq: Every day | ORAL | 1 refills | Status: DC

## 2016-05-25 MED ORDER — METHYLPREDNISOLONE SODIUM SUCC 125 MG IJ SOLR
125.0000 mg | Freq: Once | INTRAMUSCULAR | Status: AC
  Administered 2016-05-25: 125 mg via INTRAVENOUS
  Filled 2016-05-25: qty 2

## 2016-05-25 MED ORDER — FAMOTIDINE IN NACL 20-0.9 MG/50ML-% IV SOLN
20.0000 mg | Freq: Once | INTRAVENOUS | Status: AC
  Administered 2016-05-25: 20 mg via INTRAVENOUS
  Filled 2016-05-25: qty 50

## 2016-05-25 MED ORDER — PREDNISONE 20 MG PO TABS: 40.0000 mg | ORAL_TABLET | Freq: Every day | ORAL | 0 refills | Status: DC

## 2016-05-25 MED ORDER — SODIUM CHLORIDE 0.9 % IV SOLN
Freq: Once | INTRAVENOUS | Status: AC
  Administered 2016-05-25: 06:00:00 via INTRAVENOUS

## 2016-05-25 NOTE — ED Triage Notes (Signed)
Pt brought in by [facility] for c/o difficulty swallowing, pt states it feels like her throat is closing; pt was given 50mg  benadryl IV en route by ems and states she feels a little better; pt cbg was 87

## 2016-05-25 NOTE — Discharge Instructions (Addendum)
Stop taking lisinopril. Follow up with your doctor. Return to the ED if you develop chest pain, shortness of breath, or any other concerns.

## 2016-05-25 NOTE — ED Notes (Signed)
Per MD, pt to stop taking lisinopril due to allergic reaction. Pt brought medication bottle from home. MD reported would have medication disposed of. MD gave home medication bottle to this RN. This RN consulted pharmacy regarding proper disposal of lisinopril bottle. Pharmacy reported to place bottle and all contents in black bin for proper disposal.

## 2016-05-25 NOTE — ED Provider Notes (Signed)
Pt checked - tongue feels better, normal phonation - disposed of patients ACE inhibitors, pt expressed understanding of the treatment course.   Noemi Chapel, MD 05/25/16 [phone]

## 2016-05-25 NOTE — ED Notes (Signed)
Pt up to BR, ambulated with steady gait, no assistance needed

## 2016-05-25 NOTE — ED Provider Notes (Signed)
[facility] DEPT Provider Note   CSN: [id number] Arrival date & time: 05/25/16  0508     History   Chief Complaint Chief Complaint  Patient presents with  . Allergic Reaction    HPI [name] is a 66 y.o. female.  Patient presents via EMS with sensation of throat closing, hoarse voice and difficulty swallowing. Symptoms started before she went to bed last night it became worse this morning. Denies chest pain. Denies coughing or wheezing. She does not have a history of COPD but this is documented in her chart. She does take lisinopril for blood pressure. No recent changes to her medications. Denies any new foods, soaps, lotions. No new close. She is given Benadryl by EMS with some improvement. Patient with history of diabetes, hypertension, hypothyroidism. Denies rash.   The history is provided by the patient and the EMS personnel.  Allergic Reaction  Presenting symptoms: difficulty swallowing     Past Medical History:  Diagnosis Date  . Anemia   . Anxiety   . Arthritis   . Atrial fib/flutter, transient   . COPD (chronic obstructive pulmonary disease) ([place])   . Depression   . Diabetes mellitus   . Diverticulosis   . DJD (degenerative joint disease)   . GERD (gastroesophageal reflux disease)   . Gout   . History of pneumonia   . Hypertension   . Hypothyroidism   . Internal hemorrhoids     Patient Active Problem List   Diagnosis Date Noted  . Atrial fibrillation ([place]) 03/21/2014  . Enlarged lymph node 12/29/2011  . OTHER&UNSPECIFIED DISEASES THE ORAL SOFT TISSUES 08/17/2008  . GOUT 07/10/2008  . ANKLE PAIN, RIGHT 07/10/2008  . ASTHMATIC BRONCHITIS, ACUTE 05/10/2008  . ABDOMINAL PAIN, UNSPECIFIED SITE 02/16/2008  . KNEE PAIN, BILATERAL 06/17/2007  . DEPRESSIVE DISORDER 12/25/2006  . Pain in limb 12/25/2006  . Lipoma of unspecified site 09/17/2006  . HYPOTHYROIDISM 09/17/2006  . DIABETES MELLITUS, TYPE II 09/17/2006  . HYPERLIPIDEMIA 09/17/2006  .  OBSTRUCTIVE SLEEP APNEA 09/17/2006  . Essential hypertension 09/17/2006  . BRADYCARDIA, CHRONIC 09/17/2006  . HERNIA, SITE NOS W/O OBSTRUCTION/GANGRENE 09/17/2006  . DEGENERATIVE JOINT DISEASE, HANDS 09/17/2006  . 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 CATARACT EXTRACTION W/ INTRAOCULAR LENS IMPLANT Left   . HERNIA REPAIR    . PACEMAKER INSERTION    . TUBAL LIGATION    . VAGINAL HYSTERECTOMY      OB History    Gravida Para Term Preterm AB Living   3       1 2    SAB TAB Ectopic Multiple Live Births     1             Home Medications    Prior to Admission medications   Medication Sig Start Date End Date Taking? Authorizing Provider  amLODipine (NORVASC) 10 MG tablet Take 10 mg by mouth every morning.    Yes [provider]  aspirin 81 MG tablet Take 81 mg by mouth every morning.    Yes [provider]  carvedilol (COREG) 25 MG tablet Take 25 mg by mouth 2 (two) times daily with a meal.     Yes [provider]  cholecalciferol (VITAMIN D) 1000 UNITS tablet Take 1,000 Units by mouth every morning.    Yes [provider]  cloNIDine (CATAPRES) 0.2 MG tablet Take 0.2 mg by mouth 3 (three) times daily.     Yes [provider]  hydrALAZINE (APRESOLINE) 50 MG tablet Take 50 mg by mouth 2 (two) times daily.   Yes [provider]  hydrocortisone (CORTEF) 10 MG tablet Take 10 mg by mouth 3 (three) times daily.   Yes [provider]  levothyroxine (SYNTHROID, LEVOTHROID) 150 MCG tablet Take 150 mcg by mouth daily before breakfast.   Yes [provider]  lisinopril-hydrochlorothiazide (PRINZIDE,ZESTORETIC) 20-12.5 MG per tablet Take 1 tablet by mouth every morning.    Yes [provider]  lovastatin (MEVACOR) 20 MG tablet Take 20 mg by mouth at bedtime.   Yes [provider]  metFORMIN (GLUCOPHAGE) 500 MG tablet Take 500 mg by mouth 2 (two) times daily.   Yes  [provider]  Multiple Vitamin (MULTIVITAMIN) tablet Take 1 tablet by mouth every morning.    Yes [provider]  polyethylene glycol (MIRALAX / GLYCOLAX) packet Take 17 g by mouth 2 (two) times daily.    Yes [provider]  sertraline (ZOLOFT) 100 MG tablet Take 50 mg by mouth at bedtime.    Yes [provider]    Family History Family History  Problem Relation Age of Onset  . Prostate cancer Father   . Hypertension Mother   . Heart attack Mother   . Heart disease Mother   . Stomach cancer Son   . Diabetes Maternal Aunt   . Heart disease Maternal Uncle   . Colon cancer Neg Hx   . Esophageal cancer Neg Hx   . Rectal cancer Neg Hx     Social History Social History  Substance Use Topics  . Smoking status: Current Some Day Smoker    Packs/day: 0.50    Years: 30.00    Types: Cigarettes  . Smokeless tobacco: Never Used  . Alcohol use No     Allergies   Codeine phosphate; Morphine and related; Naproxen; Nsaids; Penicillins; Propoxyphene n-acetaminophen; Sulfamethoxazole-trimethoprim; and Flexeril [cyclobenzaprine]   Review of Systems Review of Systems  Constitutional: Negative for activity change, appetite change and fever.  HENT: Positive for sore throat, trouble swallowing and voice change.   Respiratory: Negative for cough, chest tightness and shortness of breath.   Gastrointestinal: Negative for abdominal pain, nausea and vomiting.  Genitourinary: Negative for dysuria, vaginal bleeding and vaginal discharge.  Musculoskeletal: Negative for arthralgias and myalgias.  Neurological: Negative for dizziness, weakness and headaches.   all other systems are negative except as noted in the HPI and PMH.     Physical Exam Updated Vital Signs BP (!) 146/92 (BP Location: Left Arm)   Pulse 79   Temp 99 F (37.2 C) (Oral)   Resp 18   Ht 5\' 5"  (1.651 m)   Wt 139 lb (63 kg)   SpO2 100%   BMI 23.13 kg/m   Physical Exam    Constitutional: She is oriented to person, place, and time. She appears well-developed and well-nourished. No distress.  HENT:  Head: Normocephalic and atraumatic.  Mouth/Throat: Oropharynx is clear and moist. No oropharyngeal exudate.  OP clear without asymmetry. Uvula midline. Hoarse voice. Tongue mildly enlarged.  No lip swelling No sublingual edema  Eyes: Conjunctivae and EOM are normal. Pupils are equal, round, and reactive to light.  Neck: Normal range of motion. Neck supple.  No meningismus.  Cardiovascular: Normal rate, regular rhythm, normal heart sounds and intact distal pulses.  Exam reveals no gallop.   No murmur heard. Pulmonary/Chest: Effort normal and breath sounds normal. No respiratory distress. She exhibits no tenderness.  Distant breath sounds  Abdominal: Soft. There is no tenderness. There is no rebound and no guarding.  Musculoskeletal: Normal range of motion. She exhibits no edema or tenderness.  Neurological: She is alert and oriented to person, place, and time. No cranial nerve deficit. She exhibits normal muscle tone. Coordination normal.  No ataxia on finger to nose bilaterally. No pronator drift. 5/5 strength throughout. CN 2-12 intact.Equal grip strength. Sensation intact.   Skin: Skin is warm. Capillary refill takes less than 2 seconds.  Psychiatric: She has a normal mood and affect. Her behavior is normal.  Nursing note and vitals reviewed.    ED Treatments / Results  Labs (all labs ordered are listed, but only abnormal results are displayed) Labs Reviewed  CBC WITH DIFFERENTIAL/PLATELET - Abnormal; Notable for the following:       Result Value   RDW 17.4 (*)    All other components within normal limits  BASIC METABOLIC PANEL - Abnormal; Notable for the following:    Potassium 3.2 (*)    BUN 25 (*)    GFR calc non Af Amer 60 (*)    All other components within normal limits    EKG  EKG Interpretation None       Radiology No results  found.  Procedures Procedures (including critical care time)  Medications Ordered in ED Medications  methylPREDNISolone sodium succinate (SOLU-MEDROL) 125 mg/2 mL injection 125 mg (not administered)  famotidine (PEPCID) IVPB 20 mg premix (not administered)  0.9 %  sodium chloride infusion (not administered)     Initial Impression / Assessment and Plan / ED Course  I have reviewed the triage vital signs and the nursing notes.  Pertinent labs & imaging results that were available during my care of the patient were reviewed by me and considered in my medical decision making (see chart for details).      Throat swelling progressively worsening overnight. Patient has no hypoxia. Lungs are clear. She is on lisinopril. No other new exposures.  Suspect ACE inhibitor angioedema. Steroids and antihistamines given. D/w patient that she needs to stop ACE.  Added to allergy list.   Tongue swelling improving.  No drooling. No CP or SOB.  Continue to monitor.   Plan observation for 3 hours and discharge home if continues to improve. Final Clinical Impressions(s) / ED Diagnoses   Final diagnoses:  Angioedema, initial encounter    New Prescriptions New Prescriptions   No medications on file     Ezequiel Essex, MD 05/25/16 [phone]

## 2016-05-27 ENCOUNTER — Emergency Department (HOSPITAL_COMMUNITY)
Admission: EM | Admit: 2016-05-27 | Discharge: 2016-05-27 | Disposition: A | Discharge status: Home / Self Care | Payer: Medicare Other | Attending: Emergency Medicine | Admitting: Emergency Medicine

## 2016-05-27 ENCOUNTER — Encounter (HOSPITAL_COMMUNITY): Payer: Self-pay | Admitting: *Deleted

## 2016-05-27 ENCOUNTER — Emergency Department (HOSPITAL_COMMUNITY): Payer: Medicare Other

## 2016-05-27 DIAGNOSIS — R062 Wheezing: Secondary | ICD-10-CM | POA: Diagnosis not present

## 2016-05-27 DIAGNOSIS — Z7984 Long term (current) use of oral hypoglycemic drugs: Secondary | ICD-10-CM | POA: Insufficient documentation

## 2016-05-27 DIAGNOSIS — R0600 Dyspnea, unspecified: Secondary | ICD-10-CM

## 2016-05-27 DIAGNOSIS — Z7982 Long term (current) use of aspirin: Secondary | ICD-10-CM | POA: Diagnosis not present

## 2016-05-27 DIAGNOSIS — R0602 Shortness of breath: Secondary | ICD-10-CM | POA: Diagnosis not present

## 2016-05-27 DIAGNOSIS — I10 Essential (primary) hypertension: Secondary | ICD-10-CM | POA: Insufficient documentation

## 2016-05-27 DIAGNOSIS — J449 Chronic obstructive pulmonary disease, unspecified: Secondary | ICD-10-CM | POA: Diagnosis not present

## 2016-05-27 DIAGNOSIS — Z79899 Other long term (current) drug therapy: Secondary | ICD-10-CM | POA: Diagnosis not present

## 2016-05-27 DIAGNOSIS — E119 Type 2 diabetes mellitus without complications: Secondary | ICD-10-CM | POA: Diagnosis not present

## 2016-05-27 DIAGNOSIS — E039 Hypothyroidism, unspecified: Secondary | ICD-10-CM | POA: Diagnosis not present

## 2016-05-27 DIAGNOSIS — F17210 Nicotine dependence, cigarettes, uncomplicated: Secondary | ICD-10-CM | POA: Insufficient documentation

## 2016-05-27 DIAGNOSIS — Z950 Presence of cardiac pacemaker: Secondary | ICD-10-CM | POA: Diagnosis not present

## 2016-05-27 LAB — CBC WITH DIFFERENTIAL/PLATELET
Basophils Absolute: 0 10*3/uL (ref 0.0–0.1)
Basophils Relative: 0 %
Eosinophils Absolute: 0 10*3/uL (ref 0.0–0.7)
Eosinophils Relative: 0 %
HCT: 34.3 % — ABNORMAL LOW (ref 36.0–46.0)
Hemoglobin: 11.5 g/dL — ABNORMAL LOW (ref 12.0–15.0)
Lymphocytes Relative: 8 %
Lymphs Abs: 0.9 10*3/uL (ref 0.7–4.0)
MCH: 30.3 pg (ref 26.0–34.0)
MCHC: 33.5 g/dL (ref 30.0–36.0)
MCV: 90.3 fL (ref 78.0–100.0)
Monocytes Absolute: 0.2 10*3/uL (ref 0.1–1.0)
Monocytes Relative: 2 %
Neutro Abs: 9.5 10*3/uL — ABNORMAL HIGH (ref 1.7–7.7)
Neutrophils Relative %: 90 %
Platelets: 242 10*3/uL (ref 150–400)
RBC: 3.8 MIL/uL — ABNORMAL LOW (ref 3.87–5.11)
RDW: 17.4 % — ABNORMAL HIGH (ref 11.5–15.5)
WBC: 10.5 10*3/uL (ref 4.0–10.5)

## 2016-05-27 LAB — BASIC METABOLIC PANEL
Anion gap: 9 (ref 5–15)
BUN: 31 mg/dL — ABNORMAL HIGH (ref 6–20)
CO2: 29 mmol/L (ref 22–32)
Calcium: 9.7 mg/dL (ref 8.9–10.3)
Chloride: 102 mmol/L (ref 101–111)
Creatinine, Ser: 0.86 mg/dL (ref 0.44–1.00)
GFR calc Af Amer: >60 mL/min (ref >60)
GFR calc non Af Amer: >60 mL/min (ref >60)
Glucose, Bld: 140 mg/dL — ABNORMAL HIGH (ref 65–99)
Potassium: 3.7 mmol/L (ref 3.5–5.1)
Sodium: 140 mmol/L (ref 135–145)

## 2016-05-27 LAB — CBG MONITORING, ED: Glucose-Capillary: 144 mg/dL — ABNORMAL HIGH (ref 65–99)

## 2016-05-27 MED ORDER — RACEPINEPHRINE HCL 2.25 % IN NEBU
INHALATION_SOLUTION | RESPIRATORY_TRACT | Status: AC
  Administered 2016-05-27: 0.5 mL
  Filled 2016-05-27: qty 0.5

## 2016-05-27 MED ORDER — ALBUTEROL (5 MG/ML) CONTINUOUS INHALATION SOLN
INHALATION_SOLUTION | RESPIRATORY_TRACT | Status: AC
  Filled 2016-05-27: qty 20

## 2016-05-27 MED ORDER — ALBUTEROL (5 MG/ML) CONTINUOUS INHALATION SOLN
10.0000 mg/h | INHALATION_SOLUTION | RESPIRATORY_TRACT | Status: DC
  Administered 2016-05-27: 10 mg/h via RESPIRATORY_TRACT

## 2016-05-27 MED ORDER — ALBUTEROL SULFATE HFA 108 (90 BASE) MCG/ACT IN AERS
2.0000 | INHALATION_SPRAY | Freq: Once | RESPIRATORY_TRACT | Status: AC
  Administered 2016-05-27: 2 via RESPIRATORY_TRACT
  Filled 2016-05-27: qty 6.7

## 2016-05-27 MED ORDER — RACEPINEPHRINE HCL 2.25 % IN NEBU: 0.5000 mL | INHALATION_SOLUTION | Freq: Once | RESPIRATORY_TRACT | Status: DC

## 2016-05-27 MED ORDER — IPRATROPIUM-ALBUTEROL 0.5-2.5 (3) MG/3ML IN SOLN
3.0000 mL | Freq: Once | RESPIRATORY_TRACT | Status: AC
  Administered 2016-05-27: 3 mL via RESPIRATORY_TRACT
  Filled 2016-05-27: qty 3

## 2016-05-27 MED ORDER — IPRATROPIUM BROMIDE 0.02 % IN SOLN
RESPIRATORY_TRACT | Status: AC
  Filled 2016-05-27: qty 5

## 2016-05-27 MED ORDER — DIPHENHYDRAMINE HCL 50 MG/ML IJ SOLN
25.0000 mg | Freq: Once | INTRAMUSCULAR | Status: AC
  Administered 2016-05-27: 25 mg via INTRAVENOUS
  Filled 2016-05-27: qty 1

## 2016-05-27 MED ORDER — IPRATROPIUM BROMIDE 0.02 % IN SOLN
1.0000 mg | Freq: Once | RESPIRATORY_TRACT | Status: AC
  Administered 2016-05-27: 1 mg via RESPIRATORY_TRACT

## 2016-05-27 NOTE — ED Triage Notes (Addendum)
Pt c/o continued SOB since Sunday. Pt was taking Lisinopril and developed SOB on Sunday and was brought to ED. Pt was taken off the Lisinopril on Sunday but has continued to have SOB since then. Pt reports the SOB got worse this morning along with tongue swelling. Pt talking in full sentences but at times is gasping to breathe.

## 2016-05-27 NOTE — ED Provider Notes (Signed)
[facility] DEPT Provider Note   CSN: [id number] Arrival date & time: 05/27/16  1426     History   Chief Complaint Chief Complaint  Patient presents with  . Shortness of Breath    HPI [name] is a 66 y.o. female.Patient developed shortness of breath and feeling of tongue swelling and hoarse voice this morning. She had similar illness for which she was seen here 2 days ago.. Angioedema was felt secondary to lisinopril. Lisinopril was discontinued. She was started on prednisone. She reports she felt normal yesterday. No other associated symptoms. Treated with prednisone earlier today.  HPI  Past Medical History:  Diagnosis Date  . Anemia   . Anxiety   . Arthritis   . Atrial fib/flutter, transient   . COPD (chronic obstructive pulmonary disease) ([place])   . Depression   . Diabetes mellitus   . Diverticulosis   . DJD (degenerative joint disease)   . GERD (gastroesophageal reflux disease)   . Gout   . History of pneumonia   . Hypertension   . Hypothyroidism   . Internal hemorrhoids     Patient Active Problem List   Diagnosis Date Noted  . Atrial fibrillation ([place]) 03/21/2014  . Enlarged lymph node 12/29/2011  . OTHER&UNSPECIFIED DISEASES THE ORAL SOFT TISSUES 08/17/2008  . GOUT 07/10/2008  . ANKLE PAIN, RIGHT 07/10/2008  . ASTHMATIC BRONCHITIS, ACUTE 05/10/2008  . ABDOMINAL PAIN, UNSPECIFIED SITE 02/16/2008  . KNEE PAIN, BILATERAL 06/17/2007  . DEPRESSIVE DISORDER 12/25/2006  . Pain in limb 12/25/2006  . Lipoma of unspecified site 09/17/2006  . HYPOTHYROIDISM 09/17/2006  . DIABETES MELLITUS, TYPE II 09/17/2006  . HYPERLIPIDEMIA 09/17/2006  . OBSTRUCTIVE SLEEP APNEA 09/17/2006  . Essential hypertension 09/17/2006  . BRADYCARDIA, CHRONIC 09/17/2006  . HERNIA, SITE NOS W/O OBSTRUCTION/GANGRENE 09/17/2006  . DEGENERATIVE JOINT DISEASE, HANDS 09/17/2006  . 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 CATARACT EXTRACTION  W/ INTRAOCULAR LENS IMPLANT Left   . HERNIA REPAIR    . PACEMAKER INSERTION    . TUBAL LIGATION    . VAGINAL HYSTERECTOMY      OB History    Gravida Para Term Preterm AB Living   3       1 2    SAB TAB Ectopic Multiple Live Births     1             Home Medications    Prior to Admission medications   Medication Sig Start Date End Date Taking? Authorizing Provider  amLODipine (NORVASC) 10 MG tablet Take 10 mg by mouth every morning.     [provider]  aspirin 81 MG tablet Take 81 mg by mouth every morning.     [provider]  carvedilol (COREG) 25 MG tablet Take 25 mg by mouth 2 (two) times daily with a meal.      [provider]  cholecalciferol (VITAMIN D) 1000 UNITS tablet Take 1,000 Units by mouth every morning.     [provider]  cloNIDine (CATAPRES) 0.2 MG tablet Take 0.2 mg by mouth 3 (three) times daily.      [provider]  hydrALAZINE (APRESOLINE) 50 MG tablet Take 50 mg by mouth 2 (two) times daily.    [provider]  hydrochlorothiazide (HYDRODIURIL) 12.5 MG tablet Take 1 tablet (12.5 mg total) by mouth daily. 05/25/16   Noemi Chapel, MD  hydrocortisone (CORTEF) 10 MG tablet Take 10 mg by mouth 3 (three) times daily.  [provider]  levothyroxine (SYNTHROID, LEVOTHROID) 150 MCG tablet Take 150 mcg by mouth daily before breakfast.    [provider]  lovastatin (MEVACOR) 20 MG tablet Take 20 mg by mouth at bedtime.    [provider]  metFORMIN (GLUCOPHAGE) 500 MG tablet Take 500 mg by mouth 2 (two) times daily.    [provider]  Multiple Vitamin (MULTIVITAMIN) tablet Take 1 tablet by mouth every morning.     [provider]  polyethylene glycol (MIRALAX / GLYCOLAX) packet Take 17 g by mouth 2 (two) times daily.     [provider]  predniSONE (DELTASONE) 20 MG tablet Take 2 tablets (40 mg total) by mouth daily. 05/25/16   Noemi Chapel, MD  sertraline  (ZOLOFT) 100 MG tablet Take 50 mg by mouth at bedtime.     [provider]    Family History Family History  Problem Relation Age of Onset  . Prostate cancer Father   . Hypertension Mother   . Heart attack Mother   . Heart disease Mother   . Stomach cancer Son   . Diabetes Maternal Aunt   . Heart disease Maternal Uncle   . Colon cancer Neg Hx   . Esophageal cancer Neg Hx   . Rectal cancer Neg Hx     Social History Social History  Substance Use Topics  . Smoking status: Current Some Day Smoker    Packs/day: 0.50    Years: 30.00    Types: Cigarettes  . Smokeless tobacco: Never Used  . Alcohol use No   No illicit drug use  Allergies   Lisinopril; Codeine phosphate; Morphine and related; Naproxen; Nsaids; Penicillins; Propoxyphene n-acetaminophen; Sulfamethoxazole-trimethoprim; and Flexeril [cyclobenzaprine]   Review of Systems Review of Systems  Constitutional: Negative.   HENT: Positive for voice change.        Tongue swelling  Respiratory: Positive for shortness of breath.   Cardiovascular: Negative.   Gastrointestinal: Negative.   Musculoskeletal: Negative.   Skin: Negative.   Neurological: Negative.   Psychiatric/Behavioral: Negative.   All other systems reviewed and are negative.    Physical Exam Updated Vital Signs BP 131/81   Pulse 65   Temp 98.6 F (37 C) (Oral)   Resp 12   Ht 5\' 5"  (1.651 m)   Wt 139 lb (63 kg)   SpO2 98%   BMI 23.13 kg/m   Physical Exam  Constitutional: She is oriented to person, place, and time. She appears well-developed and well-nourished. She appears distressed.  Mild respiratory distress  HENT:  Head: Normocephalic and atraumatic.  No appreciable swelling of tongue. Handling secretions well.  Eyes: Conjunctivae are normal. Pupils are equal, round, and reactive to light.  Neck: Neck supple. No tracheal deviation present. No thyromegaly present.  Resource  Cardiovascular: Normal rate and regular rhythm.   No  murmur heard. Pulmonary/Chest: She is in respiratory distress.  Inspiratory wheezes and mild respiratory distress. Coughing occasionally  Abdominal: Soft. Bowel sounds are normal. She exhibits no distension. There is no tenderness.  Musculoskeletal: Normal range of motion. She exhibits no edema or tenderness.  Neurological: She is alert and oriented to person, place, and time. Coordination normal.  Skin: Skin is warm and dry. No rash noted.  Psychiatric: She has a normal mood and affect.  Nursing note and vitals reviewed.    ED Treatments / Results  Labs (all labs ordered are listed, but only abnormal results are displayed) Labs Reviewed  CBC WITH DIFFERENTIAL/PLATELET - Abnormal;  Notable for the following:       Result Value   RBC 3.80 (*)    Hemoglobin 11.5 (*)    HCT 34.3 (*)    RDW 17.4 (*)    Neutro Abs 9.5 (*)    All other components within normal limits  BASIC METABOLIC PANEL    EKG  EKG Interpretation  Date/Time:  Tuesday May 27 2016 14:36:24 EDT Ventricular Rate:  70 PR Interval:    QRS Duration: 95 QT Interval:  377 QTC Calculation: 407 R Axis:   -10 Text Interpretation:  Sinus rhythm No significant change since last tracing Confirmed by Winfred Leeds  MD, Kathan Kirker [phone]) on 05/27/2016 2:39:46 PM     Chest x-ray viewed by me Results for orders placed or performed during the hospital encounter of 01/21/70  Basic metabolic panel  Result Value Ref Range   Sodium 140 135 - 145 mmol/L   Potassium 3.7 3.5 - 5.1 mmol/L   Chloride 102 101 - 111 mmol/L   CO2 29 22 - 32 mmol/L   Glucose, Bld 140 (H) 65 - 99 mg/dL   BUN 31 (H) 6 - 20 mg/dL   Creatinine, Ser 0.86 0.44 - 1.00 mg/dL   Calcium 9.7 8.9 - 10.3 mg/dL   GFR calc non Af Amer >60 >60 mL/min   GFR calc Af Amer >60 >60 mL/min   Anion gap 9 5 - 15  CBC with Differential/Platelet  Result Value Ref Range   WBC 10.5 4.0 - 10.5 K/uL   RBC 3.80 (L) 3.87 - 5.11 MIL/uL   Hemoglobin 11.5 (L) 12.0 - 15.0 g/dL   HCT 34.3  (L) 36.0 - 46.0 %   MCV 90.3 78.0 - 100.0 fL   MCH 30.3 26.0 - 34.0 pg   MCHC 33.5 30.0 - 36.0 g/dL   RDW 17.4 (H) 11.5 - 15.5 %   Platelets 242 150 - 400 K/uL   Neutrophils Relative % 90 %   Neutro Abs 9.5 (H) 1.7 - 7.7 K/uL   Lymphocytes Relative 8 %   Lymphs Abs 0.9 0.7 - 4.0 K/uL   Monocytes Relative 2 %   Monocytes Absolute 0.2 0.1 - 1.0 K/uL   Eosinophils Relative 0 %   Eosinophils Absolute 0.0 0.0 - 0.7 K/uL   Basophils Relative 0 %   Basophils Absolute 0.0 0.0 - 0.1 K/uL   Dg Chest Port 1 View  Result Date: 05/27/2016 CLINICAL DATA:  Shortness of breath for several days. EXAM: PORTABLE CHEST 1 VIEW COMPARISON:  07/18/2012 FINDINGS: There is no focal parenchymal opacity. There is no pleural effusion or pneumothorax. The heart and mediastinal contours are unremarkable. There is a cardiac pacemaker present in unchanged position. The osseous structures are unremarkable. IMPRESSION: No active disease. Electronically Signed   By: Kathreen Devoid   On: 05/27/2016 15:27    Radiology No results found.  Procedures Procedures (including critical care time)  Medications Ordered in ED Medications  ipratropium-albuterol (DUONEB) 0.5-2.5 (3) MG/3ML nebulizer solution 3 mL (not administered)  Racepinephrine HCl 2.25 % nebulizer solution (not administered)  diphenhydrAMINE (BENADRYL) injection 25 mg (25 mg Intravenous Given 05/27/16 1457)    3:20 PM breathing improved after treatment with racemic epinephrine nebulized treatment. She states her tongue swelling is improving. On repeat exam she is coughing proximal may return seconds. Lungs with diminished breath sounds diffusely. She appears in no distress. Tongue is not swollen. Continuous nebulization therapy with albuterol and ipratropium ordered  I consulted Hospital pharmacist who reports that  amlodipine can occasionally cause angioedema. He suggests stopping amlodipine  Initial Impression / Assessment and Plan / ED Course  I have reviewed  the triage vital signs and the nursing notes.  Pertinent labs & imaging results that were available during my care of the patient were reviewed by me and considered in my medical decision making (see chart for details).     4:15 PM patient states breathing is prudent improving while being treated with continuous nebulization containing albuterol and ipratropium. I counseled patient for 5 minutes on smoking cessation. I feel that today's symptoms are mostly due to exacerbation of COPD Pt signed out to Dr,.Mesner 415 pm  Final Clinical Impressions(s) / ED Diagnoses  Diagnosis #1acute dyspnea #2 hyperglycemia  Final diagnoses:  None    New Prescriptions New Prescriptions   No medications on file     Orlie Dakin, MD 05/27/16 1623

## 2016-05-27 NOTE — ED Notes (Signed)
Pt appears to have more difficulty taking in a breath.  States her breathing is worse since she arrived.  Dr. Milagros Evener. In room

## 2016-05-27 NOTE — ED Notes (Signed)
Pt ambulatory to waiting room. Pt verbalized understanding of discharge instructions.   

## 2016-05-27 NOTE — ED Notes (Addendum)
Pt jogged to bathroom.

## 2016-09-17 ENCOUNTER — Encounter: Payer: Self-pay | Admitting: *Deleted

## 2016-11-18 ENCOUNTER — Telehealth: Payer: Self-pay | Admitting: Orthopaedic Surgery

## 2016-11-18 NOTE — Telephone Encounter (Signed)
We have tried multiple times to call this patient to schedule her an appointment.  I left a message again this morning and she called me back.  She said due to financial reasons, she wanted to hold off on scheduling an appointment her.  She said she would call us back when she is ready to schedule appointment.  I faxed a message to her PCP, [facility] regarding this conversation.

## 2016-11-25 ENCOUNTER — Encounter (HOSPITAL_COMMUNITY): Payer: Self-pay

## 2017-05-06 ENCOUNTER — Observation Stay (HOSPITAL_COMMUNITY)
Admission: EM | Admit: 2017-05-06 | Discharge: 2017-05-07 | Disposition: A | Discharge status: Home / Self Care | Payer: Medicare Other | Attending: Internal Medicine | Admitting: Internal Medicine

## 2017-05-06 ENCOUNTER — Emergency Department (HOSPITAL_COMMUNITY): Payer: Medicare Other

## 2017-05-06 ENCOUNTER — Other Ambulatory Visit: Payer: Self-pay

## 2017-05-06 ENCOUNTER — Encounter (HOSPITAL_COMMUNITY): Payer: Self-pay | Admitting: Emergency Medicine

## 2017-05-06 DIAGNOSIS — Z950 Presence of cardiac pacemaker: Secondary | ICD-10-CM

## 2017-05-06 DIAGNOSIS — I152 Hypertension secondary to endocrine disorders: Secondary | ICD-10-CM | POA: Diagnosis present

## 2017-05-06 DIAGNOSIS — R1011 Right upper quadrant pain: Secondary | ICD-10-CM | POA: Diagnosis not present

## 2017-05-06 DIAGNOSIS — E876 Hypokalemia: Secondary | ICD-10-CM

## 2017-05-06 DIAGNOSIS — R109 Unspecified abdominal pain: Secondary | ICD-10-CM

## 2017-05-06 DIAGNOSIS — I480 Paroxysmal atrial fibrillation: Secondary | ICD-10-CM | POA: Diagnosis not present

## 2017-05-06 DIAGNOSIS — E1159 Type 2 diabetes mellitus with other circulatory complications: Secondary | ICD-10-CM | POA: Diagnosis present

## 2017-05-06 DIAGNOSIS — R1030 Lower abdominal pain, unspecified: Secondary | ICD-10-CM | POA: Insufficient documentation

## 2017-05-06 DIAGNOSIS — E039 Hypothyroidism, unspecified: Secondary | ICD-10-CM | POA: Diagnosis present

## 2017-05-06 DIAGNOSIS — Z79899 Other long term (current) drug therapy: Secondary | ICD-10-CM | POA: Diagnosis not present

## 2017-05-06 DIAGNOSIS — I10 Essential (primary) hypertension: Secondary | ICD-10-CM

## 2017-05-06 DIAGNOSIS — R1110 Vomiting, unspecified: Secondary | ICD-10-CM | POA: Diagnosis not present

## 2017-05-06 DIAGNOSIS — I4891 Unspecified atrial fibrillation: Secondary | ICD-10-CM | POA: Diagnosis present

## 2017-05-06 DIAGNOSIS — R1013 Epigastric pain: Secondary | ICD-10-CM

## 2017-05-06 DIAGNOSIS — E119 Type 2 diabetes mellitus without complications: Secondary | ICD-10-CM | POA: Diagnosis not present

## 2017-05-06 DIAGNOSIS — Z7984 Long term (current) use of oral hypoglycemic drugs: Secondary | ICD-10-CM | POA: Diagnosis not present

## 2017-05-06 DIAGNOSIS — F17210 Nicotine dependence, cigarettes, uncomplicated: Secondary | ICD-10-CM | POA: Diagnosis not present

## 2017-05-06 DIAGNOSIS — J449 Chronic obstructive pulmonary disease, unspecified: Secondary | ICD-10-CM | POA: Insufficient documentation

## 2017-05-06 DIAGNOSIS — E1169 Type 2 diabetes mellitus with other specified complication: Secondary | ICD-10-CM | POA: Diagnosis present

## 2017-05-06 DIAGNOSIS — R1084 Generalized abdominal pain: Secondary | ICD-10-CM | POA: Diagnosis present

## 2017-05-06 DIAGNOSIS — I482 Chronic atrial fibrillation, unspecified: Secondary | ICD-10-CM | POA: Diagnosis present

## 2017-05-06 DIAGNOSIS — E032 Hypothyroidism due to medicaments and other exogenous substances: Secondary | ICD-10-CM

## 2017-05-06 DIAGNOSIS — E785 Hyperlipidemia, unspecified: Secondary | ICD-10-CM

## 2017-05-06 DIAGNOSIS — R112 Nausea with vomiting, unspecified: Secondary | ICD-10-CM | POA: Diagnosis not present

## 2017-05-06 DIAGNOSIS — R197 Diarrhea, unspecified: Secondary | ICD-10-CM | POA: Diagnosis not present

## 2017-05-06 DIAGNOSIS — Z7982 Long term (current) use of aspirin: Secondary | ICD-10-CM | POA: Insufficient documentation

## 2017-05-06 LAB — LACTIC ACID, PLASMA: Lactic Acid, Venous: 1.2 mmol/L (ref 0.5–1.9)

## 2017-05-06 LAB — COMPREHENSIVE METABOLIC PANEL
ALT: 16 U/L (ref 14–54)
AST: 25 U/L (ref 15–41)
Albumin: 4.3 g/dL (ref 3.5–5.0)
Alkaline Phosphatase: 73 U/L (ref 38–126)
Anion gap: 11 (ref 5–15)
BUN: 12 mg/dL (ref 6–20)
CO2: 27 mmol/L (ref 22–32)
Calcium: 9.4 mg/dL (ref 8.9–10.3)
Chloride: 98 mmol/L — ABNORMAL LOW (ref 101–111)
Creatinine, Ser: 0.75 mg/dL (ref 0.44–1.00)
GFR calc Af Amer: >60 mL/min (ref >60)
GFR calc non Af Amer: >60 mL/min (ref >60)
Glucose, Bld: 157 mg/dL — ABNORMAL HIGH (ref 65–99)
Potassium: 2.9 mmol/L — ABNORMAL LOW (ref 3.5–5.1)
Sodium: 136 mmol/L (ref 135–145)
Total Bilirubin: 0.6 mg/dL (ref 0.3–1.2)
Total Protein: 8.8 g/dL — ABNORMAL HIGH (ref 6.5–8.1)

## 2017-05-06 LAB — GLUCOSE, CAPILLARY
Glucose-Capillary: 158 mg/dL — ABNORMAL HIGH (ref 65–99)
Glucose-Capillary: 177 mg/dL — ABNORMAL HIGH (ref 65–99)
Glucose-Capillary: 182 mg/dL — ABNORMAL HIGH (ref 65–99)

## 2017-05-06 LAB — URINALYSIS, ROUTINE W REFLEX MICROSCOPIC
Bacteria, UA: NONE SEEN
Bilirubin Urine: NEGATIVE
Glucose, UA: 150 mg/dL — AB
Hgb urine dipstick: NEGATIVE
Ketones, ur: NEGATIVE mg/dL
Leukocytes, UA: NEGATIVE
Nitrite: NEGATIVE
Protein, ur: 30 mg/dL — AB
Specific Gravity, Urine: 1.014 (ref 1.005–1.030)
pH: 8 (ref 5.0–8.0)

## 2017-05-06 LAB — CBC WITH DIFFERENTIAL/PLATELET
Basophils Absolute: 0 10*3/uL (ref 0.0–0.1)
Basophils Relative: 0 %
Eosinophils Absolute: 0 10*3/uL (ref 0.0–0.7)
Eosinophils Relative: 0 %
HCT: 41.4 % (ref 36.0–46.0)
Hemoglobin: 13.2 g/dL (ref 12.0–15.0)
Lymphocytes Relative: 12 %
Lymphs Abs: 1.3 10*3/uL (ref 0.7–4.0)
MCH: 27 pg (ref 26.0–34.0)
MCHC: 31.9 g/dL (ref 30.0–36.0)
MCV: 84.7 fL (ref 78.0–100.0)
Monocytes Absolute: 0.3 10*3/uL (ref 0.1–1.0)
Monocytes Relative: 3 %
Neutro Abs: 9.6 10*3/uL — ABNORMAL HIGH (ref 1.7–7.7)
Neutrophils Relative %: 85 %
Platelets: 306 10*3/uL (ref 150–400)
RBC: 4.89 MIL/uL (ref 3.87–5.11)
RDW: 18.4 % — ABNORMAL HIGH (ref 11.5–15.5)
WBC: 11.3 10*3/uL — ABNORMAL HIGH (ref 4.0–10.5)

## 2017-05-06 LAB — MAGNESIUM: Magnesium: 1.5 mg/dL — ABNORMAL LOW (ref 1.7–2.4)

## 2017-05-06 LAB — TSH: TSH: 3.5 u[IU]/mL (ref 0.350–4.500)

## 2017-05-06 LAB — LIPASE, BLOOD: Lipase: 65 U/L — ABNORMAL HIGH (ref 11–51)

## 2017-05-06 MED ORDER — HYDROMORPHONE HCL 1 MG/ML IJ SOLN
0.5000 mg | Freq: Once | INTRAMUSCULAR | Status: AC
  Administered 2017-05-06: 0.5 mg via INTRAVENOUS
  Filled 2017-05-06: qty 1

## 2017-05-06 MED ORDER — CARVEDILOL 12.5 MG PO TABS
25.0000 mg | ORAL_TABLET | Freq: Two times a day (BID) | ORAL | Status: DC
  Administered 2017-05-07: 25 mg via ORAL
  Filled 2017-05-06 (×2): qty 2

## 2017-05-06 MED ORDER — INSULIN ASPART 100 UNIT/ML ~~LOC~~ SOLN
3.0000 [IU] | Freq: Three times a day (TID) | SUBCUTANEOUS | Status: DC
  Administered 2017-05-07: 3 [IU] via SUBCUTANEOUS

## 2017-05-06 MED ORDER — SERTRALINE HCL 50 MG PO TABS
50.0000 mg | ORAL_TABLET | Freq: Every day | ORAL | Status: DC
  Administered 2017-05-06: 50 mg via ORAL
  Filled 2017-05-06: qty 1

## 2017-05-06 MED ORDER — HYDRALAZINE HCL 20 MG/ML IJ SOLN
5.0000 mg | Freq: Once | INTRAMUSCULAR | Status: AC
  Administered 2017-05-06: 5 mg via INTRAVENOUS
  Filled 2017-05-06: qty 1

## 2017-05-06 MED ORDER — SODIUM CHLORIDE 0.9 % IV BOLUS
1000.0000 mL | Freq: Once | INTRAVENOUS | Status: AC
  Administered 2017-05-06: 1000 mL via INTRAVENOUS

## 2017-05-06 MED ORDER — PRAVASTATIN SODIUM 40 MG PO TABS
40.0000 mg | ORAL_TABLET | Freq: Every day | ORAL | Status: DC
  Filled 2017-05-06: qty 1

## 2017-05-06 MED ORDER — IOPAMIDOL (ISOVUE-300) INJECTION 61%
100.0000 mL | Freq: Once | INTRAVENOUS | Status: AC | PRN
  Administered 2017-05-06: 100 mL via INTRAVENOUS

## 2017-05-06 MED ORDER — ONDANSETRON HCL 4 MG PO TABS: 4.0000 mg | ORAL_TABLET | Freq: Four times a day (QID) | ORAL | Status: DC | PRN

## 2017-05-06 MED ORDER — KETOROLAC TROMETHAMINE 15 MG/ML IJ SOLN
15.0000 mg | Freq: Four times a day (QID) | INTRAMUSCULAR | Status: DC | PRN
  Administered 2017-05-06 – 2017-05-07 (×3): 15 mg via INTRAVENOUS
  Filled 2017-05-06 (×3): qty 1

## 2017-05-06 MED ORDER — ONDANSETRON HCL 4 MG/2ML IJ SOLN
4.0000 mg | Freq: Once | INTRAMUSCULAR | Status: AC
Start: 2017-05-06 — End: 2017-05-06
  Administered 2017-05-06: 4 mg via INTRAVENOUS
  Filled 2017-05-06: qty 2

## 2017-05-06 MED ORDER — DOCUSATE SODIUM 100 MG PO CAPS
100.0000 mg | ORAL_CAPSULE | Freq: Two times a day (BID) | ORAL | Status: DC
  Administered 2017-05-06 – 2017-05-07 (×2): 100 mg via ORAL
  Filled 2017-05-06 (×2): qty 1

## 2017-05-06 MED ORDER — LEVOTHYROXINE SODIUM 75 MCG PO TABS
150.0000 ug | ORAL_TABLET | Freq: Every day | ORAL | Status: DC
  Administered 2017-05-07: 150 ug via ORAL
  Filled 2017-05-06: qty 2

## 2017-05-06 MED ORDER — ACETAMINOPHEN 325 MG PO TABS: 650.0000 mg | ORAL_TABLET | Freq: Four times a day (QID) | ORAL | Status: DC | PRN

## 2017-05-06 MED ORDER — HYDROMORPHONE HCL 1 MG/ML IJ SOLN
1.0000 mg | Freq: Once | INTRAMUSCULAR | Status: AC
  Administered 2017-05-06: 1 mg via INTRAVENOUS
  Filled 2017-05-06: qty 1

## 2017-05-06 MED ORDER — FAMOTIDINE IN NACL 20-0.9 MG/50ML-% IV SOLN
20.0000 mg | Freq: Once | INTRAVENOUS | Status: AC
  Administered 2017-05-06: 20 mg via INTRAVENOUS
  Filled 2017-05-06: qty 50

## 2017-05-06 MED ORDER — LABETALOL HCL 5 MG/ML IV SOLN
5.0000 mg | Freq: Once | INTRAVENOUS | Status: AC
  Administered 2017-05-06: 5 mg via INTRAVENOUS
  Filled 2017-05-06: qty 4

## 2017-05-06 MED ORDER — HYDRALAZINE HCL 25 MG PO TABS
50.0000 mg | ORAL_TABLET | Freq: Two times a day (BID) | ORAL | Status: DC
  Administered 2017-05-06 – 2017-05-07 (×2): 50 mg via ORAL
  Filled 2017-05-06 (×2): qty 2

## 2017-05-06 MED ORDER — ACETAMINOPHEN 650 MG RE SUPP: 650.0000 mg | Freq: Four times a day (QID) | RECTAL | Status: DC | PRN

## 2017-05-06 MED ORDER — POTASSIUM CHLORIDE 10 MEQ/100ML IV SOLN
10.0000 meq | Freq: Once | INTRAVENOUS | Status: AC
  Administered 2017-05-06: 10 meq via INTRAVENOUS
  Filled 2017-05-06: qty 100

## 2017-05-06 MED ORDER — POTASSIUM CHLORIDE 10 MEQ/100ML IV SOLN
10.0000 meq | INTRAVENOUS | Status: AC
  Administered 2017-05-06 (×6): 10 meq via INTRAVENOUS
  Filled 2017-05-06 (×6): qty 100

## 2017-05-06 MED ORDER — ONDANSETRON HCL 4 MG/2ML IJ SOLN
4.0000 mg | Freq: Four times a day (QID) | INTRAMUSCULAR | Status: DC | PRN
  Administered 2017-05-06 – 2017-05-07 (×3): 4 mg via INTRAVENOUS
  Filled 2017-05-06 (×3): qty 2

## 2017-05-06 MED ORDER — POTASSIUM CHLORIDE CRYS ER 20 MEQ PO TBCR
40.0000 meq | EXTENDED_RELEASE_TABLET | Freq: Once | ORAL | Status: AC
Start: 2017-05-06 — End: 2017-05-06
  Administered 2017-05-06: 40 meq via ORAL
  Filled 2017-05-06: qty 2

## 2017-05-06 MED ORDER — VITAMIN D 1000 UNITS PO TABS
1000.0000 [IU] | ORAL_TABLET | Freq: Every morning | ORAL | Status: DC
  Administered 2017-05-07: 1000 [IU] via ORAL
  Filled 2017-05-06: qty 1

## 2017-05-06 MED ORDER — SODIUM CHLORIDE 0.9 % IV SOLN
INTRAVENOUS | Status: DC
  Administered 2017-05-06 – 2017-05-07 (×3): via INTRAVENOUS

## 2017-05-06 MED ORDER — FENTANYL CITRATE (PF) 100 MCG/2ML IJ SOLN
100.0000 ug | Freq: Once | INTRAMUSCULAR | Status: AC
  Administered 2017-05-06: 100 ug via INTRAVENOUS
  Filled 2017-05-06: qty 2

## 2017-05-06 MED ORDER — ASPIRIN 81 MG PO CHEW
81.0000 mg | CHEWABLE_TABLET | Freq: Every morning | ORAL | Status: DC
  Administered 2017-05-07: 81 mg via ORAL
  Filled 2017-05-06: qty 1

## 2017-05-06 MED ORDER — INSULIN ASPART 100 UNIT/ML ~~LOC~~ SOLN
0.0000 [IU] | Freq: Three times a day (TID) | SUBCUTANEOUS | Status: DC
  Administered 2017-05-06: 2 [IU] via SUBCUTANEOUS
  Administered 2017-05-07: 1 [IU] via SUBCUTANEOUS

## 2017-05-06 MED ORDER — HYDRALAZINE HCL 20 MG/ML IJ SOLN
10.0000 mg | Freq: Four times a day (QID) | INTRAMUSCULAR | Status: DC | PRN
  Administered 2017-05-06 – 2017-05-07 (×2): 10 mg via INTRAVENOUS
  Filled 2017-05-06 (×2): qty 1

## 2017-05-06 MED ORDER — ENOXAPARIN SODIUM 40 MG/0.4ML ~~LOC~~ SOLN
40.0000 mg | SUBCUTANEOUS | Status: DC
  Administered 2017-05-06: 40 mg via SUBCUTANEOUS
  Filled 2017-05-06: qty 0.4

## 2017-05-06 MED ORDER — MORPHINE SULFATE (PF) 2 MG/ML IV SOLN
2.0000 mg | INTRAVENOUS | Status: DC | PRN
  Administered 2017-05-06 – 2017-05-07 (×7): 2 mg via INTRAVENOUS
  Filled 2017-05-06 (×7): qty 1

## 2017-05-06 MED ORDER — POLYETHYLENE GLYCOL 3350 17 G PO PACK
17.0000 g | PACK | Freq: Two times a day (BID) | ORAL | Status: DC
  Administered 2017-05-07: 17 g via ORAL
  Filled 2017-05-06: qty 1

## 2017-05-06 MED ORDER — INSULIN ASPART 100 UNIT/ML ~~LOC~~ SOLN: 0.0000 [IU] | Freq: Every day | SUBCUTANEOUS | Status: DC

## 2017-05-06 MED ORDER — ADULT MULTIVITAMIN W/MINERALS CH
1.0000 | ORAL_TABLET | Freq: Every morning | ORAL | Status: DC
  Administered 2017-05-07: 1 via ORAL
  Filled 2017-05-06: qty 1

## 2017-05-06 MED ORDER — CLONIDINE HCL 0.2 MG PO TABS
0.2000 mg | ORAL_TABLET | Freq: Three times a day (TID) | ORAL | Status: DC
  Administered 2017-05-06 – 2017-05-07 (×2): 0.2 mg via ORAL
  Filled 2017-05-06 (×3): qty 1

## 2017-05-06 NOTE — ED Triage Notes (Signed)
Pt c/o abd pain with n/v.  

## 2017-05-06 NOTE — ED Notes (Signed)
EDP aware of bp.  

## 2017-05-06 NOTE — H&P (Signed)
History and Physical    [name] MLY:[id number] DOB: September 10, 1950 DOA: 05/06/2017  Referring MD/NP/PA: Milton Ferguson, EDP PCP: Antonietta Jewel, MD  Patient coming from: Home  Chief Complaint: Abdominal pain, emesis  HPI: [name] is a 67 y.o. female with multiple medical comorbidities including diabetes, hypertension, hypothyroidism and A. fib flutter not anticoagulated but who has a permanent pacemaker who presents to the hospital today with severe abdominal pain and emesis.  She states that the pain has become progressively worse over the course of the last 2-3 days.  She started vomiting about 18 hours ago initially food content and now a yellowish fluid.  When I see her she is in significant pain to the point where she is sitting up at bedside clutching her abdomen and groaning.  She is she is begging that I help relieve her pain.  She has visible emesis in her emesis bag in her hand.  In the ED she was found to be markedly hypertensive with blood pressures in the 220/120 range, lab work is significant for a potassium of 2.9, a lipase of 65 and a WBC count of 11.3.  UA is negative.  Contrasted CT scan of the abdomen shows no acute abnormalities, there is a nonobstructing 3 mm stone at the upper pole of the left kidney.  Right upper quadrant ultrasound is normal.  Due to profound hypokalemia, persistent emesis and abdominal pain admission has been requested.  Past Medical/Surgical History: Past Medical History:  Diagnosis Date  . Anemia   . Anxiety   . Arthritis   . Atrial fib/flutter, transient   . COPD (chronic obstructive pulmonary disease) ([place])   . Depression   . Diabetes mellitus   . Diverticulosis   . DJD (degenerative joint disease)   . GERD (gastroesophageal reflux disease)   . Gout   . History of pneumonia   . Hypertension   . Hypothyroidism   . Internal hemorrhoids     Past Surgical History:  Procedure Laterality Date  . CATARACT EXTRACTION W/ INTRAOCULAR LENS  IMPLANT Left   . HERNIA REPAIR    . PACEMAKER INSERTION    . TUBAL LIGATION    . VAGINAL HYSTERECTOMY      Social History:  reports that she has been smoking cigarettes.  She has a 15.00 pack-year smoking history. She has never used smokeless tobacco. She reports that she does not drink alcohol or use drugs.  Allergies: Allergies  Allergen Reactions  . Lisinopril     angioedema  . Codeine Phosphate Other (See Comments)    REACTION: UNKNOWN  . Morphine And Related Other (See Comments)    Cannot take:Heart problems  . Naproxen Nausea And Vomiting  . Nsaids Nausea Only  . Penicillins     UNKNOWN REACTION  . Propoxyphene N-Acetaminophen Other (See Comments)    Makes me gag  . Sulfamethoxazole-Trimethoprim Other (See Comments)    Gi upset  . Flexeril [Cyclobenzaprine] Anxiety and Other (See Comments)    Pt states medication gives her nightmares and insomnia.    Family History:  Family History  Problem Relation Age of Onset  . Prostate cancer Father   . Hypertension Mother   . Heart attack Mother   . Heart disease Mother   . Stomach cancer Son   . Diabetes Maternal Aunt   . Heart disease Maternal Uncle   . Colon cancer Neg Hx   . Esophageal cancer Neg Hx   . Rectal cancer Neg Hx  Prior to Admission medications   Medication Sig Start Date End Date Taking? Authorizing Provider  aspirin 81 MG tablet Take 81 mg by mouth every morning.    Yes [provider]  carvedilol (COREG) 25 MG tablet Take 25 mg by mouth 2 (two) times daily with a meal.     Yes [provider]  cholecalciferol (VITAMIN D) 1000 UNITS tablet Take 1,000 Units by mouth every morning.    Yes [provider]  cloNIDine (CATAPRES) 0.2 MG tablet Take 0.2 mg by mouth 3 (three) times daily.     Yes [provider]  hydrALAZINE (APRESOLINE) 50 MG tablet Take 50 mg by mouth 2 (two) times daily.   Yes [provider]  hydrochlorothiazide (HYDRODIURIL) 12.5 MG tablet  Take 1 tablet (12.5 mg total) by mouth daily. 05/25/16  Yes Noemi Chapel, MD  levothyroxine (SYNTHROID, LEVOTHROID) 150 MCG tablet Take 150 mcg by mouth daily before breakfast.   Yes [provider]  lovastatin (MEVACOR) 20 MG tablet Take 20 mg by mouth at bedtime.   Yes [provider]  metFORMIN (GLUCOPHAGE) 500 MG tablet Take 500 mg by mouth 2 (two) times daily.   Yes [provider]  Multiple Vitamin (MULTIVITAMIN) tablet Take 1 tablet by mouth every morning.    Yes [provider]  polyethylene glycol (MIRALAX / GLYCOLAX) packet Take 17 g by mouth 2 (two) times daily.    Yes [provider]  sertraline (ZOLOFT) 100 MG tablet Take 50 mg by mouth at bedtime.    Yes [provider]  hydrocortisone (CORTEF) 10 MG tablet Take 10 mg by mouth 3 (three) times daily.    [provider]    Review of Systems:  Constitutional: Denies fever, chills, diaphoresis, appetite change and fatigue.  HEENT: Denies photophobia, eye pain, redness, hearing loss, ear pain, congestion, sore throat, rhinorrhea, sneezing, mouth sores, trouble swallowing, neck pain, neck stiffness and tinnitus.   Respiratory: Denies SOB, DOE, cough, chest tightness,  and wheezing.   Cardiovascular: Denies chest pain, palpitations and leg swelling.  Gastrointestinal: Denies  diarrhea, constipation, blood in stool and abdominal distention.  Genitourinary: Denies dysuria, urgency, frequency, hematuria, flank pain and difficulty urinating.  Endocrine: Denies: hot or cold intolerance, sweats, changes in hair or nails, polyuria, polydipsia. Musculoskeletal: Denies myalgias, back pain, joint swelling, arthralgias and gait problem.  Skin: Denies pallor, rash and wound.  Neurological: Denies dizziness, seizures, syncope, weakness, light-headedness, numbness and headaches.  Hematological: Denies adenopathy. Easy bruising, personal or family bleeding history  Psychiatric/Behavioral:  Denies suicidal ideation, mood changes, confusion, nervousness, sleep disturbance and agitation    Physical Exam: Vitals:   05/06/17 0857 05/06/17 0900 05/06/17 1014 05/06/17 1027  BP: (!) 229/119 (!) 193/120 (!) 214/123 (!) 219/127  Pulse:  82 86 87  Resp:  (!) 9 18 18   Temp:   98.9 F (37.2 C) 98.2 F (36.8 C)  TempSrc:   Oral Oral  SpO2:  94% 97% 95%  Weight:      Height:         Constitutional: In significant distress due to abdominal pain Eyes: PERRL, lids and conjunctivae normal ENMT: Mucous membranes are dry. Posterior pharynx clear of any exudate or lesions.Poor dentition.  Neck: normal, supple, no masses, no thyromegaly Respiratory: clear to auscultation bilaterally, no wheezing, no crackles. Normal respiratory effort. No accessory muscle use.  Cardiovascular: Regular rate and rhythm, no murmurs / rubs / gallops. No extremity edema. 2+ pedal pulses. No carotid bruits.  Abdomen: no appreciable tenderness to palpation, no masses palpated. No hepatosplenomegaly. Bowel sounds positive.  Musculoskeletal: no clubbing / cyanosis. No joint deformity upper and lower extremities. Good ROM, no contractures. Normal muscle tone.  Skin: no rashes, lesions, ulcers. No induration Neurologic: CN 2-12 grossly intact. Sensation intact, DTR normal. Strength 5/5 in all 4.  Psychiatric: Normal judgment and insight. Alert and oriented x 3. Normal mood.    Labs on Admission: I have personally reviewed the following labs and imaging studies  CBC: Recent Labs  Lab 05/06/17 0353  WBC 11.3*  NEUTROABS 9.6*  HGB 13.2  HCT 41.4  MCV 84.7  PLT 277   Basic Metabolic Panel: Recent Labs  Lab 05/06/17 0353  NA 136  K 2.9*  CL 98*  CO2 27  GLUCOSE 157*  BUN 12  CREATININE 0.75  CALCIUM 9.4   GFR: Estimated Creatinine Clearance: 61.4 mL/min (by C-G formula based on SCr of 0.75 mg/dL). Liver Function Tests: Recent Labs  Lab 05/06/17 0353  AST 25  ALT 16  ALKPHOS 73  BILITOT 0.6   PROT 8.8*  ALBUMIN 4.3   Recent Labs  Lab 05/06/17 0353  LIPASE 65*   No results for input(s): AMMONIA in the last 168 hours. Coagulation Profile: No results for input(s): INR, PROTIME in the last 168 hours. Cardiac Enzymes: No results for input(s): CKTOTAL, CKMB, CKMBINDEX, TROPONINI in the last 168 hours. BNP (last 3 results) No results for input(s): PROBNP in the last 8760 hours. HbA1C: No results for input(s): HGBA1C in the last 72 hours. CBG: No results for input(s): GLUCAP in the last 168 hours. Lipid Profile: No results for input(s): CHOL, HDL, LDLCALC, TRIG, CHOLHDL, LDLDIRECT in the last 72 hours. Thyroid Function Tests: No results for input(s): TSH, T4TOTAL, FREET4, T3FREE, THYROIDAB in the last 72 hours. Anemia Panel: No results for input(s): VITAMINB12, FOLATE, FERRITIN, TIBC, IRON, RETICCTPCT in the last 72 hours. Urine analysis:    Component Value Date/Time   COLORURINE YELLOW 05/06/2017 0352   APPEARANCEUR CLEAR 05/06/2017 0352   LABSPEC 1.014 05/06/2017 0352   PHURINE 8.0 05/06/2017 0352   GLUCOSEU 150 (A) 05/06/2017 0352   GLUCOSEU NEGATIVE 02/16/2008 1531   HGBUR NEGATIVE 05/06/2017 0352   BILIRUBINUR NEGATIVE 05/06/2017 0352   KETONESUR NEGATIVE 05/06/2017 0352   PROTEINUR 30 (A) 05/06/2017 0352   UROBILINOGEN 0.2 02/19/2011 1642   NITRITE NEGATIVE 05/06/2017 0352   LEUKOCYTESUR NEGATIVE 05/06/2017 0352   Sepsis Labs: @LABRCNTIP (procalcitonin:4,lacticidven:4) )No results found for this or any previous visit (from the past 240 hour(s)).   Radiological Exams on Admission: Ct Abdomen Pelvis W Contrast  Result Date: 05/06/2017 CLINICAL DATA:  Acute onset of generalized abdominal pain, nausea and vomiting. EXAM: CT ABDOMEN AND PELVIS WITH CONTRAST TECHNIQUE: Multidetector CT imaging of the abdomen and pelvis was performed using the standard protocol following bolus administration of intravenous contrast. CONTRAST:  165mL ISOVUE-300 IOPAMIDOL  (ISOVUE-300) INJECTION 61% COMPARISON:  CT of the abdomen and pelvis performed 05/29/2011 FINDINGS: Lower chest: Mild bibasilar atelectasis or scarring is noted. Pacemaker leads are partially imaged. Hepatobiliary: The liver is unremarkable in appearance. The gallbladder is unremarkable in appearance. The common bile duct remains normal in caliber. Pancreas: The pancreas is within normal limits. Spleen: A 1.0 cm nonspecific hypodensity is noted within the spleen. The spleen is otherwise unremarkable. Adrenals/Urinary Tract: The adrenal glands are unremarkable in appearance. A nonobstructing 3 mm stone is noted at the upper pole of the left kidney. The kidneys are otherwise unremarkable. There is  no evidence of hydronephrosis. No obstructing ureteral stones are identified. No significant perinephric stranding is seen. Stomach/Bowel: The stomach is unremarkable in appearance. The small bowel is within normal limits. The appendix is not visualized; there is no evidence for appendicitis. The colon is unremarkable in appearance. Vascular/Lymphatic: Scattered calcification is seen along the abdominal aorta and its branches. The abdominal aorta is otherwise grossly unremarkable. The inferior vena cava is grossly unremarkable. No retroperitoneal lymphadenopathy is seen. No pelvic sidewall lymphadenopathy is identified. Reproductive: The bladder is mildly distended and grossly unremarkable. The patient is status post hysterectomy. No suspicious adnexal masses are seen. Other: No additional soft tissue abnormalities are seen. Musculoskeletal: No acute osseous abnormalities are identified. Facet disease is noted at the lower lumbar spine, with mild grade 1 anterolisthesis of L4 on L5. The visualized musculature is unremarkable in appearance. IMPRESSION: 1. No acute abnormality seen to explain the patient's symptoms. 2. Nonobstructing 3 mm stone at the upper pole of the left kidney. 3. Nonspecific 1.0 cm hypodensity within the  spleen. 4. Mild bibasilar atelectasis or scarring. Aortic Atherosclerosis (ICD10-I70.0). Electronically Signed   By: Garald Balding M.D.   On: 05/06/2017 06:32   US Abdomen Limited Ruq  Result Date: 05/06/2017 CLINICAL DATA:  Right upper quadrant pain for 3 days. Diarrhea, nausea, vomiting EXAM: ULTRASOUND ABDOMEN LIMITED RIGHT UPPER QUADRANT COMPARISON:  05/06/2017 CT abdomen/pelvis FINDINGS: Gallbladder: No gallstones or wall thickening visualized. No sonographic Murphy sign noted by sonographer. Common bile duct: Diameter: 5.3 mm Liver: No focal lesion identified. Within normal limits in parenchymal echogenicity. Portal vein is patent on color Doppler imaging with normal direction of blood flow towards the liver. IMPRESSION: Normal right upper quadrant ultrasound. Electronically Signed   By: Kathreen Devoid   On: 05/06/2017 08:13    EKG: Independently reviewed.  Sinus rhythm with anteroseptal Q waves  Assessment/Plan Principal Problem:   Abdominal pain Active Problems:   Hypokalemia   Hypothyroidism   Type 2 diabetes mellitus with hyperlipidemia ([facility])   Essential hypertension   PPM-[employer]   Atrial fibrillation ([facility])    Severe abdominal pain -She has had significant and extensive workup in the emergency department without identifiable etiology for her abdominal pain including a CT scan, right upper quadrant of the abdomen. -Her lipase is mildly elevated at 65, however with lack of pancreatitis seen on CT scan do not believe this is the etiology. -The only other possible etiology that I can think of is mesenteric ischemia, especially given the fact that she has atrial fibrillation and is not chronically anticoagulated so embolic disease to the mesenteric arteries is possible.  Also her abdominal pain is significantly out of proportion to her clinical exam and imaging findings. -We will order a lactic acid, if elevated this might point me more in the direction of mesenteric ischemia. -For  now we will allow clear liquids only, will treat with IV Toradol as well as IV morphine. -IV Zofran as needed for nausea/emesis.  Hypokalemia -Believe due to severe GI losses and emesis. -We will replace IV and request a magnesium level.  Chronic atrial fibrillation -Status post permanent pacemaker. -Per EKG was in sinus rhythm on admission. -Not anticoagulated.  Hypertensive urgency -Blood pressure severely elevated to the 220/120 range. -I believe this is a combination of not being able to tolerate her oral meds due to her emesis as well as severe pain. -She states she believes she might be able to tolerate her oral antihypertensive agents, will resume, will also add as  needed hydralazine for systolic blood pressure above 180.  Hypothyroidism -Check TSH and continue home dose of Synthroid.   DVT prophylaxis: Lovenox Code Status: Full code Family Communication: Patient only Disposition Plan: Anticipate discharge home in 24-48 hours Consults called: None Admission status: It is my clinical opinion that referral for OBSERVATION is reasonable and necessary in this patient based on the above information provided. The aforementioned taken together are felt to place the patient at high risk for further clinical deterioration. However it is anticipated that the patient may be medically stable for discharge from the hospital within 24 to 48 hours.      Time Spent: 85 minutes  Mosella Kasa Isaac Bliss MD [facility] Pager [phone]  If 7PM-7AM, please contact night-coverage [email or web address] Password [email or web address]  05/06/2017, 11:52 AM   \

## 2017-05-06 NOTE — ED Provider Notes (Signed)
[facility] EMERGENCY DEPARTMENT Provider Note   CSN: [id number] Arrival date & time: 05/06/17  0342     History   Chief Complaint Chief Complaint  Patient presents with  . Abdominal Pain    HPI [name] is a 67 y.o. female.  The history is provided by the patient.  Abdominal Pain   This is a new problem. The current episode started more than 2 days ago. The problem occurs daily. The problem has been rapidly worsening. The pain is located in the generalized abdominal region. The pain is severe. Associated symptoms include diarrhea, nausea and vomiting. Pertinent negatives include fever, hematochezia, melena and constipation. The symptoms are aggravated by palpation. Nothing relieves the symptoms.  Patient with diabetes, COPD, hypertension presents with severe abdominal pain for the past 3 days.  She reports is rapidly worsening.  She reports nausea vomiting.  She denies any blood in her vomit or stool.  No chest pain/shortness of breath.  She does not recall having this pain previously. She reports her primary doctor told her she had a hernia  Past Medical History:  Diagnosis Date  . Anemia   . Anxiety   . Arthritis   . Atrial fib/flutter, transient   . COPD (chronic obstructive pulmonary disease) ([place])   . Depression   . Diabetes mellitus   . Diverticulosis   . DJD (degenerative joint disease)   . GERD (gastroesophageal reflux disease)   . Gout   . History of pneumonia   . Hypertension   . Hypothyroidism   . Internal hemorrhoids     Patient Active Problem List   Diagnosis Date Noted  . Atrial fibrillation ([place]) 03/21/2014  . Enlarged lymph node 12/29/2011  . OTHER&UNSPECIFIED DISEASES THE ORAL SOFT TISSUES 08/17/2008  . GOUT 07/10/2008  . ANKLE PAIN, RIGHT 07/10/2008  . ASTHMATIC BRONCHITIS, ACUTE 05/10/2008  . ABDOMINAL PAIN, UNSPECIFIED SITE 02/16/2008  . KNEE PAIN, BILATERAL 06/17/2007  . DEPRESSIVE DISORDER 12/25/2006  . Pain in limb 12/25/2006  .  Lipoma of unspecified site 09/17/2006  . HYPOTHYROIDISM 09/17/2006  . DIABETES MELLITUS, TYPE II 09/17/2006  . HYPERLIPIDEMIA 09/17/2006  . OBSTRUCTIVE SLEEP APNEA 09/17/2006  . Essential hypertension 09/17/2006  . BRADYCARDIA, CHRONIC 09/17/2006  . HERNIA, SITE NOS W/O OBSTRUCTION/GANGRENE 09/17/2006  . DEGENERATIVE JOINT DISEASE, HANDS 09/17/2006  . 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 CATARACT EXTRACTION W/ INTRAOCULAR LENS IMPLANT Left   . HERNIA REPAIR    . PACEMAKER INSERTION    . TUBAL LIGATION    . VAGINAL HYSTERECTOMY       OB History    Gravida  3   Para      Term      Preterm      AB  1   Living  2     SAB      TAB  1   Ectopic      Multiple      Live Births               Home Medications    Prior to Admission medications   Medication Sig Start Date End Date Taking? Authorizing Provider  aspirin 81 MG tablet Take 81 mg by mouth every morning.    Yes [provider]  carvedilol (COREG) 25 MG tablet Take 25 mg by mouth 2 (two) times daily with a meal.     Yes [provider]  cholecalciferol (VITAMIN D) 1000 UNITS tablet  Take 1,000 Units by mouth every morning.    Yes [provider]  cloNIDine (CATAPRES) 0.2 MG tablet Take 0.2 mg by mouth 3 (three) times daily.     Yes [provider]  hydrALAZINE (APRESOLINE) 50 MG tablet Take 50 mg by mouth 2 (two) times daily.   Yes [provider]  hydrochlorothiazide (HYDRODIURIL) 12.5 MG tablet Take 1 tablet (12.5 mg total) by mouth daily. 05/25/16  Yes Noemi Chapel, MD  levothyroxine (SYNTHROID, LEVOTHROID) 150 MCG tablet Take 150 mcg by mouth daily before breakfast.   Yes [provider]  lovastatin (MEVACOR) 20 MG tablet Take 20 mg by mouth at bedtime.   Yes [provider]  metFORMIN (GLUCOPHAGE) 500 MG tablet Take 500 mg by mouth 2 (two) times daily.   Yes [provider]  Multiple  Vitamin (MULTIVITAMIN) tablet Take 1 tablet by mouth every morning.    Yes [provider]  polyethylene glycol (MIRALAX / GLYCOLAX) packet Take 17 g by mouth 2 (two) times daily.    Yes [provider]  sertraline (ZOLOFT) 100 MG tablet Take 50 mg by mouth at bedtime.    Yes [provider]  hydrocortisone (CORTEF) 10 MG tablet Take 10 mg by mouth 3 (three) times daily.    [provider]  predniSONE (DELTASONE) 20 MG tablet Take 2 tablets (40 mg total) by mouth daily. 05/25/16   Noemi Chapel, MD    Family History Family History  Problem Relation Age of Onset  . Prostate cancer Father   . Hypertension Mother   . Heart attack Mother   . Heart disease Mother   . Stomach cancer Son   . Diabetes Maternal Aunt   . Heart disease Maternal Uncle   . Colon cancer Neg Hx   . Esophageal cancer Neg Hx   . Rectal cancer Neg Hx     Social History Social History   Tobacco Use  . Smoking status: Current Some Day Smoker    Packs/day: 0.50    Years: 30.00    Pack years: 15.00    Types: Cigarettes  . Smokeless tobacco: Never Used  Substance Use Topics  . Alcohol use: No  . Drug use: No     Allergies   Lisinopril; Codeine phosphate; Morphine and related; Naproxen; Nsaids; Penicillins; Propoxyphene n-acetaminophen; Sulfamethoxazole-trimethoprim; and Flexeril [cyclobenzaprine]   Review of Systems Review of Systems  Constitutional: Negative for fever.  Respiratory: Negative for shortness of breath.   Cardiovascular: Negative for chest pain.  Gastrointestinal: Positive for abdominal pain, diarrhea, nausea and vomiting. Negative for blood in stool, constipation, hematochezia and melena.  Musculoskeletal: Negative for back pain.  All other systems reviewed and are negative.    Physical Exam Updated Vital Signs BP (!) 202/111 (BP Location: Right Arm)   Pulse 77   Temp 98.5 F (36.9 C)   Resp 18   Ht 1.651 m (5\' 5" )   Wt 63 kg (139 lb)   SpO2 99%    BMI 23.13 kg/m   Physical Exam  CONSTITUTIONAL: Well developed/well nourished, crying and anxious HEAD: Normocephalic/atraumatic EYES: EOMI/PERRL, no icterus ENMT: Mucous membranes moist NECK: supple no meningeal signs SPINE/BACK:entire spine nontender CV: S1/S2 noted, no murmurs/rubs/gallops noted LUNGS: Lungs are clear to auscultation bilaterally, no apparent distress ABDOMEN: soft, moderate diffuse tenderness, no rebound or guarding, decreased bowel sounds noted throughout abdomen  GU:no cva tenderness NEURO: Pt is awake/alert/appropriate, moves all extremitiesx4.    EXTREMITIES: pulses normal/equal, full ROM SKIN: warm, color  normal PSYCH: Anxious and crying  ED Treatments / Results  Labs (all labs ordered are listed, but only abnormal results are displayed) Labs Reviewed  COMPREHENSIVE METABOLIC PANEL - Abnormal; Notable for the following components:      Result Value   Potassium 2.9 (*)    Chloride 98 (*)    Glucose, Bld 157 (*)    Total Protein 8.8 (*)    All other components within normal limits  CBC WITH DIFFERENTIAL/PLATELET - Abnormal; Notable for the following components:   WBC 11.3 (*)    RDW 18.4 (*)    Neutro Abs 9.6 (*)    All other components within normal limits  LIPASE, BLOOD - Abnormal; Notable for the following components:   Lipase 65 (*)    All other components within normal limits  URINALYSIS, ROUTINE W REFLEX MICROSCOPIC - Abnormal; Notable for the following components:   Glucose, UA 150 (*)    Protein, ur 30 (*)    Squamous Epithelial / LPF 0-5 (*)    All other components within normal limits    EKG EKG Interpretation  Date/Time:  Wednesday May 06 2017 06:51:00 EDT Ventricular Rate:  81 PR Interval:    QRS Duration: 87 QT Interval:  403 QTC Calculation: 468 R Axis:   -63 Text Interpretation:  Sinus rhythm Prominent P waves, nondiagnostic Left anterior fascicular block Anteroseptal infarct, age indeterminate Confirmed by Ripley Fraise ([id number]) on 05/06/2017 6:56:53 AM   Radiology Ct Abdomen Pelvis W Contrast  Result Date: 05/06/2017 CLINICAL DATA:  Acute onset of generalized abdominal pain, nausea and vomiting. EXAM: CT ABDOMEN AND PELVIS WITH CONTRAST TECHNIQUE: Multidetector CT imaging of the abdomen and pelvis was performed using the standard protocol following bolus administration of intravenous contrast. CONTRAST:  147mL ISOVUE-300 IOPAMIDOL (ISOVUE-300) INJECTION 61% COMPARISON:  CT of the abdomen and pelvis performed 05/29/2011 FINDINGS: Lower chest: Mild bibasilar atelectasis or scarring is noted. Pacemaker leads are partially imaged. Hepatobiliary: The liver is unremarkable in appearance. The gallbladder is unremarkable in appearance. The common bile duct remains normal in caliber. Pancreas: The pancreas is within normal limits. Spleen: A 1.0 cm nonspecific hypodensity is noted within the spleen. The spleen is otherwise unremarkable. Adrenals/Urinary Tract: The adrenal glands are unremarkable in appearance. A nonobstructing 3 mm stone is noted at the upper pole of the left kidney. The kidneys are otherwise unremarkable. There is no evidence of hydronephrosis. No obstructing ureteral stones are identified. No significant perinephric stranding is seen. Stomach/Bowel: The stomach is unremarkable in appearance. The small bowel is within normal limits. The appendix is not visualized; there is no evidence for appendicitis. The colon is unremarkable in appearance. Vascular/Lymphatic: Scattered calcification is seen along the abdominal aorta and its branches. The abdominal aorta is otherwise grossly unremarkable. The inferior vena cava is grossly unremarkable. No retroperitoneal lymphadenopathy is seen. No pelvic sidewall lymphadenopathy is identified. Reproductive: The bladder is mildly distended and grossly unremarkable. The patient is status post hysterectomy. No suspicious adnexal masses are seen. Other: No additional soft tissue  abnormalities are seen. Musculoskeletal: No acute osseous abnormalities are identified. Facet disease is noted at the lower lumbar spine, with mild grade 1 anterolisthesis of L4 on L5. The visualized musculature is unremarkable in appearance. IMPRESSION: 1. No acute abnormality seen to explain the patient's symptoms. 2. Nonobstructing 3 mm stone at the upper pole of the left kidney. 3. Nonspecific 1.0 cm hypodensity within the spleen. 4. Mild bibasilar atelectasis or scarring. Aortic Atherosclerosis (ICD10-I70.0). Electronically Signed  By: Garald Balding M.D.   On: 05/06/2017 06:32    Procedures Procedures  Medications Ordered in ED Medications  potassium chloride SA (K-DUR,KLOR-CON) CR tablet 40 mEq (has no administration in time range)  potassium chloride 10 mEq in 100 mL IVPB (has no administration in time range)  sodium chloride 0.9 % bolus 1,000 mL (has no administration in time range)  ondansetron (ZOFRAN) injection 4 mg (4 mg Intravenous Given 05/06/17 0357)  fentaNYL (SUBLIMAZE) injection 100 mcg (100 mcg Intravenous Given 05/06/17 0357)  HYDROmorphone (DILAUDID) injection 0.5 mg (0.5 mg Intravenous Given 05/06/17 0438)  HYDROmorphone (DILAUDID) injection 1 mg (1 mg Intravenous Given 05/06/17 0500)  iopamidol (ISOVUE-300) 61 % injection 100 mL (100 mLs Intravenous Contrast Given 05/06/17 0550)  HYDROmorphone (DILAUDID) injection 1 mg (1 mg Intravenous Given 05/06/17 0603)     Initial Impression / Assessment and Plan / ED Course  I have reviewed the triage vital signs and the nursing notes.  Pertinent labs & imaging results that were available during my care of the patient were reviewed by me and considered in my medical decision making (see chart for details).     3:59 AM Unclear cause of abdominal pain/nausea/vomiting.  She is uncomfortable appearing.  Labs pending at this time.  She will likely need CT imaging Hypertension likely due to the fact she cannot keep her BP meds  down 7:18 AM CT imaging does not reveal any acute abdominal emergency.  No obvious signs of appendicitis.  Overall she is improved, but does still have right upper quadrant and epigastric tenderness.  She does report recent vomiting and diarrhea.  She reports her vomit and stool have both been nonbloody. Denies chest pain or shortness of breath. BP (!) 176/105   Pulse 84   Temp 98.5 F (36.9 C)   Resp 13   Ht 1.651 m (5\' 5" )   Wt 63 kg (139 lb)   SpO2 93%   BMI 23.13 kg/m  Due to persistent right upper quadrant and epigastric tenderness, will proceed with ultrasound imaging to have definitive evaluation of any gallbladder disease She is also noted to be hypokalemic due to vomiting/diarrhea, will give oral and IV potassium. Signed out to Dr. Roderic Palau with ultrasound pending.  If negative patient can be discharged with follow-up with gastroenterology  Final Clinical Impressions(s) / ED Diagnoses   Final diagnoses:  RUQ pain  Vomiting and diarrhea  Hypokalemia    ED Discharge Orders    None       Ripley Fraise, MD 05/06/17 [phone]

## 2017-05-06 NOTE — ED Provider Notes (Signed)
Patient with continued abdominal pain and vomiting.  She also has uncontrolled hypertension.  She will be admitted for potassium replacement and control of her vomiting and blood pressure   Milton Ferguson, MD 05/06/17 [phone]

## 2017-05-06 NOTE — ED Notes (Signed)
Dr. Roderic Palau informed pt requesting  Pain medication.  EDP at bedside.

## 2017-05-07 DIAGNOSIS — E876 Hypokalemia: Secondary | ICD-10-CM | POA: Diagnosis not present

## 2017-05-07 DIAGNOSIS — I10 Essential (primary) hypertension: Secondary | ICD-10-CM | POA: Diagnosis not present

## 2017-05-07 DIAGNOSIS — R109 Unspecified abdominal pain: Secondary | ICD-10-CM

## 2017-05-07 LAB — GLUCOSE, CAPILLARY
Glucose-Capillary: 137 mg/dL — ABNORMAL HIGH (ref 65–99)
Glucose-Capillary: 92 mg/dL (ref 65–99)

## 2017-05-07 LAB — CBC
HCT: 43.7 % (ref 36.0–46.0)
Hemoglobin: 14 g/dL (ref 12.0–15.0)
MCH: 26.7 pg (ref 26.0–34.0)
MCHC: 32 g/dL (ref 30.0–36.0)
MCV: 83.4 fL (ref 78.0–100.0)
Platelets: 285 10*3/uL (ref 150–400)
RBC: 5.24 MIL/uL — ABNORMAL HIGH (ref 3.87–5.11)
RDW: 18.4 % — ABNORMAL HIGH (ref 11.5–15.5)
WBC: 12.6 10*3/uL — ABNORMAL HIGH (ref 4.0–10.5)

## 2017-05-07 LAB — COMPREHENSIVE METABOLIC PANEL
ALT: 16 U/L (ref 14–54)
AST: 24 U/L (ref 15–41)
Albumin: 3.9 g/dL (ref 3.5–5.0)
Alkaline Phosphatase: 77 U/L (ref 38–126)
Anion gap: 13 (ref 5–15)
BUN: 12 mg/dL (ref 6–20)
CO2: 24 mmol/L (ref 22–32)
Calcium: 9 mg/dL (ref 8.9–10.3)
Chloride: 97 mmol/L — ABNORMAL LOW (ref 101–111)
Creatinine, Ser: 0.66 mg/dL (ref 0.44–1.00)
GFR calc Af Amer: >60 mL/min (ref >60)
GFR calc non Af Amer: >60 mL/min (ref >60)
Glucose, Bld: 130 mg/dL — ABNORMAL HIGH (ref 65–99)
Potassium: 3.3 mmol/L — ABNORMAL LOW (ref 3.5–5.1)
Sodium: 134 mmol/L — ABNORMAL LOW (ref 135–145)
Total Bilirubin: 0.7 mg/dL (ref 0.3–1.2)
Total Protein: 8.2 g/dL — ABNORMAL HIGH (ref 6.5–8.1)

## 2017-05-07 MED ORDER — POTASSIUM CHLORIDE CRYS ER 20 MEQ PO TBCR
40.0000 meq | EXTENDED_RELEASE_TABLET | Freq: Once | ORAL | Status: AC
  Administered 2017-05-07: 40 meq via ORAL
  Filled 2017-05-07: qty 2

## 2017-05-07 NOTE — Care Management Obs Status (Signed)
[facility] NOTIFICATION   Patient Details  Name: KARAN INCLAN MRN: [id number] Date of Birth: 1950/05/02   Medicare Observation Status Notification Given:  Yes    Sherald Barge, RN 05/07/2017, 10:52 AM

## 2017-05-07 NOTE — Progress Notes (Signed)
Patient is to be discharged home and in stable condition. Patient's IV and telemetry removed, WNL. Patient given discharge instructions and verbalized understanding. Patient will be escorted out by staff via wheelchair upon arrival of transportation.  Lorris Carducci P Dishmon, RN  

## 2017-05-07 NOTE — Discharge Summary (Signed)
Physician Discharge Summary  [name] YQM:[id number] DOB: [birth year] DOA: 05/06/2017  PCP: Antonietta Jewel, MD  Admit date: 05/06/2017 Discharge date: 05/07/2017  Time spent: 45 minutes  Recommendations for Outpatient Follow-up:  -To be discharged home today. -Advised to follow-up with primary care provider in 2 weeks.  Discharge Diagnoses:  Principal Problem:   Abdominal pain Active Problems:   Hypokalemia   Hypothyroidism   Type 2 diabetes mellitus with hyperlipidemia (HCC)   Essential hypertension   PPM-Medtronic   Atrial fibrillation [facility])   Discharge Condition: Stable and improved  Filed Weights   05/06/17 0343  Weight: 63 kg (139 lb)    History of present illness:  [name] is a 67 y.o. female with multiple medical comorbidities including diabetes, hypertension, hypothyroidism and A. fib flutter not anticoagulated but who has a permanent pacemaker who presents to the hospital today with severe abdominal pain and emesis.  She states that the pain has become progressively worse over the course of the last 2-3 days.  She started vomiting about 18 hours ago initially food content and now a yellowish fluid.  When I see her she is in significant pain to the point where she is sitting up at bedside clutching her abdomen and groaning.  She is she is begging that I help relieve her pain.  She has visible emesis in her emesis bag in her hand.  In the ED she was found to be markedly hypertensive with blood pressures in the 220/120 range, lab work is significant for a potassium of 2.9, a lipase of 65 and a WBC count of 11.3.  UA is negative.  Contrasted CT scan of the abdomen shows no acute abnormalities, there is a nonobstructing 3 mm stone at the upper pole of the left kidney.  Right upper quadrant ultrasound is normal.  Due to profound hypokalemia, persistent emesis and abdominal pain admission has been requested.    Hospital Course:   Severe abdominal pain -She had  extensive workup in the emergency department without identifiable etiology for her abdominal pain including CT scan, right upper quadrant of the abdomen and blood work. -She was treated symptomatically with antiemetics and pain medications and has had a remarkable recovery overnight. -On discharge etiology of abdominal pain is unclear.  Hypokalemia -Has been adequately replaced, did receive 40 mEq of potassium on day of discharge due to a potassium of 3.3.  Chronic atrial fibrillation -Status post permanent pacemaker, not anticoagulated, EKG showed sinus rhythm on admission.  Hypertensive emergency -Initial blood pressure was in the 220/120 range, I suspect related to pain. -Home medications were continued. -Blood pressure is 118/60 on discharge.  Hypothyroidism -Home dose of Synthroid has been continued.    Procedures:  None   Consultations:  None  Discharge Instructions  Discharge Instructions    Diet - low sodium heart healthy   Complete by:  As directed    Increase activity slowly   Complete by:  As directed      Allergies as of 05/07/2017      Reactions   Lisinopril    angioedema   Codeine Phosphate Other (See Comments)   REACTION: UNKNOWN   Morphine And Related Other (See Comments)   Cannot take:Heart problems   Naproxen Nausea And Vomiting   Nsaids Nausea Only   Penicillins    UNKNOWN REACTION   Propoxyphene N-acetaminophen Other (See Comments)   Makes me gag   Sulfamethoxazole-trimethoprim Other (See Comments)   Gi upset  Flexeril [cyclobenzaprine] Anxiety, Other (See Comments)   Pt states medication gives her nightmares and insomnia.      Medication List    STOP taking these medications   hydrocortisone 10 MG tablet Commonly known as:  CORTEF   predniSONE 20 MG tablet Commonly known as:  DELTASONE     TAKE these medications   aspirin 81 MG tablet Take 81 mg by mouth every morning.   carvedilol 25 MG tablet Commonly known as:   COREG Take 25 mg by mouth 2 (two) times daily with a meal.   cholecalciferol 1000 units tablet Commonly known as:  VITAMIN D Take 1,000 Units by mouth every morning.   cloNIDine 0.2 MG tablet Commonly known as:  CATAPRES Take 0.2 mg by mouth 3 (three) times daily.   hydrALAZINE 50 MG tablet Commonly known as:  APRESOLINE Take 50 mg by mouth 2 (two) times daily.   hydrochlorothiazide 12.5 MG tablet Commonly known as:  HYDRODIURIL Take 1 tablet (12.5 mg total) by mouth daily.   levothyroxine 150 MCG tablet Commonly known as:  SYNTHROID, LEVOTHROID Take 150 mcg by mouth daily before breakfast.   lovastatin 20 MG tablet Commonly known as:  MEVACOR Take 20 mg by mouth at bedtime.   metFORMIN 500 MG tablet Commonly known as:  GLUCOPHAGE Take 500 mg by mouth 2 (two) times daily.   multivitamin tablet Take 1 tablet by mouth every morning.   polyethylene glycol packet Commonly known as:  MIRALAX / GLYCOLAX Take 17 g by mouth 2 (two) times daily.   sertraline 100 MG tablet Commonly known as:  ZOLOFT Take 50 mg by mouth at bedtime.      Allergies  Allergen Reactions  . Lisinopril     angioedema  . Codeine Phosphate Other (See Comments)    REACTION: UNKNOWN  . Morphine And Related Other (See Comments)    Cannot take:Heart problems  . Naproxen Nausea And Vomiting  . Nsaids Nausea Only  . Penicillins     UNKNOWN REACTION  . Propoxyphene N-Acetaminophen Other (See Comments)    Makes me gag  . Sulfamethoxazole-Trimethoprim Other (See Comments)    Gi upset  . Flexeril [Cyclobenzaprine] Anxiety and Other (See Comments)    Pt states medication gives her nightmares and insomnia.   Follow-up Information    Antonietta Jewel, MD In 1 week.   Specialty:  Internal Medicine Contact information: [address], [place] [address] [phone]        Rogene Houston, MD.   Specialty:  Gastroenterology Contact information: [place], [address] [place]  [address] [phone]            The results of significant diagnostics from this hospitalization (including imaging, microbiology, ancillary and laboratory) are listed below for reference.    Significant Diagnostic Studies: Ct Abdomen Pelvis W Contrast  Result Date: 05/06/2017 CLINICAL DATA:  Acute onset of generalized abdominal pain, nausea and vomiting. EXAM: CT ABDOMEN AND PELVIS WITH CONTRAST TECHNIQUE: Multidetector CT imaging of the abdomen and pelvis was performed using the standard protocol following bolus administration of intravenous contrast. CONTRAST:  115mL ISOVUE-300 IOPAMIDOL (ISOVUE-300) INJECTION 61% COMPARISON:  CT of the abdomen and pelvis performed 05/29/2011 FINDINGS: Lower chest: Mild bibasilar atelectasis or scarring is noted. Pacemaker leads are partially imaged. Hepatobiliary: The liver is unremarkable in appearance. The gallbladder is unremarkable in appearance. The common bile duct remains normal in caliber. Pancreas: The pancreas is within normal limits. Spleen: A 1.0 cm nonspecific  hypodensity is noted within the spleen. The spleen is otherwise unremarkable. Adrenals/Urinary Tract: The adrenal glands are unremarkable in appearance. A nonobstructing 3 mm stone is noted at the upper pole of the left kidney. The kidneys are otherwise unremarkable. There is no evidence of hydronephrosis. No obstructing ureteral stones are identified. No significant perinephric stranding is seen. Stomach/Bowel: The stomach is unremarkable in appearance. The small bowel is within normal limits. The appendix is not visualized; there is no evidence for appendicitis. The colon is unremarkable in appearance. Vascular/Lymphatic: Scattered calcification is seen along the abdominal aorta and its branches. The abdominal aorta is otherwise grossly unremarkable. The inferior vena cava is grossly unremarkable. No retroperitoneal lymphadenopathy is seen. No pelvic sidewall lymphadenopathy is identified.  Reproductive: The bladder is mildly distended and grossly unremarkable. The patient is status post hysterectomy. No suspicious adnexal masses are seen. Other: No additional soft tissue abnormalities are seen. Musculoskeletal: No acute osseous abnormalities are identified. Facet disease is noted at the lower lumbar spine, with mild grade 1 anterolisthesis of L4 on L5. The visualized musculature is unremarkable in appearance. IMPRESSION: 1. No acute abnormality seen to explain the patient's symptoms. 2. Nonobstructing 3 mm stone at the upper pole of the left kidney. 3. Nonspecific 1.0 cm hypodensity within the spleen. 4. Mild bibasilar atelectasis or scarring. Aortic Atherosclerosis (ICD10-I70.0). Electronically Signed   By: Garald Balding M.D.   On: 05/06/2017 06:32   US Abdomen Limited Ruq  Result Date: 05/06/2017 CLINICAL DATA:  Right upper quadrant pain for 3 days. Diarrhea, nausea, vomiting EXAM: ULTRASOUND ABDOMEN LIMITED RIGHT UPPER QUADRANT COMPARISON:  05/06/2017 CT abdomen/pelvis FINDINGS: Gallbladder: No gallstones or wall thickening visualized. No sonographic Murphy sign noted by sonographer. Common bile duct: Diameter: 5.3 mm Liver: No focal lesion identified. Within normal limits in parenchymal echogenicity. Portal vein is patent on color Doppler imaging with normal direction of blood flow towards the liver. IMPRESSION: Normal right upper quadrant ultrasound. Electronically Signed   By: Kathreen Devoid   On: 05/06/2017 08:13    Microbiology: No results found for this or any previous visit (from the past 240 hour(s)).   Labs: Basic Metabolic Panel: Recent Labs  Lab 05/06/17 0353 05/06/17 1242 05/07/17 0447  NA 136  --  134*  K 2.9*  --  3.3*  CL 98*  --  97*  CO2 27  --  24  GLUCOSE 157*  --  130*  BUN 12  --  12  CREATININE 0.75  --  0.66  CALCIUM 9.4  --  9.0  MG  --  1.5*  --    Liver Function Tests: Recent Labs  Lab 05/06/17 0353 05/07/17 0447  AST 25 24  ALT 16 16    ALKPHOS 73 77  BILITOT 0.6 0.7  PROT 8.8* 8.2*  ALBUMIN 4.3 3.9   Recent Labs  Lab 05/06/17 0353  LIPASE 65*   No results for input(s): AMMONIA in the last 168 hours. CBC: Recent Labs  Lab 05/06/17 0353 05/07/17 0447  WBC 11.3* 12.6*  NEUTROABS 9.6*  --   HGB 13.2 14.0  HCT 41.4 43.7  MCV 84.7 83.4  PLT 306 285   Cardiac Enzymes: No results for input(s): CKTOTAL, CKMB, CKMBINDEX, TROPONINI in the last 168 hours. BNP: BNP (last 3 results) No results for input(s): BNP in the last 8760 hours.  ProBNP (last 3 results) No results for input(s): PROBNP in the last 8760 hours.  CBG: Recent Labs  Lab 05/06/17 1248 05/06/17 1629 05/06/17  2116 05/07/17 0723 05/07/17 1122  GLUCAP 158* 177* 182* 137* 92       Signed:  [facility] Pager: [phone] 05/07/2017, 4:38 PM

## 2017-05-07 NOTE — Care Management Note (Signed)
Case Management Note  Patient Details  Name: [name] MRN: [id number] Date of Birth: 1950/02/01  Subjective/Objective:    Observation for abdominal pain. From home with spouse, ind pta. Has PCP and insurance with drug coverage. No HH or DME at this time.                 Action/Plan: DC home with self are in next 24 hrs. No CM needs noted at this time. May be formally consulted if needs arise.   Expected Discharge Date:     05/08/2017          Expected Discharge Plan:  Home/Self Care  In-House Referral:  NA  Discharge planning Services  CM Consult  Post Acute Care Choice:  NA Choice offered to:  NA  Status of Service:  Completed, signed off  If discussed at Long Length of Stay Meetings, dates discussed:    Additional Comments:  Sherald Barge, RN 05/07/2017, 10:53 AM

## 2017-09-29 ENCOUNTER — Encounter: Payer: Self-pay | Admitting: Internal Medicine

## 2019-08-28 ENCOUNTER — Other Ambulatory Visit: Payer: Self-pay

## 2019-08-28 ENCOUNTER — Encounter (HOSPITAL_COMMUNITY): Payer: Self-pay

## 2019-08-28 ENCOUNTER — Inpatient Hospital Stay (HOSPITAL_COMMUNITY)
Admission: EM | Admit: 2019-08-28 | Discharge: 2019-09-19 | DRG: 219 | Disposition: A | Discharge status: Home Health | Payer: Medicare Other | Attending: Internal Medicine | Admitting: Internal Medicine

## 2019-08-28 ENCOUNTER — Emergency Department (HOSPITAL_COMMUNITY): Payer: Medicare Other

## 2019-08-28 DIAGNOSIS — Z833 Family history of diabetes mellitus: Secondary | ICD-10-CM

## 2019-08-28 DIAGNOSIS — T783XXA Angioneurotic edema, initial encounter: Secondary | ICD-10-CM | POA: Diagnosis not present

## 2019-08-28 DIAGNOSIS — F17210 Nicotine dependence, cigarettes, uncomplicated: Secondary | ICD-10-CM | POA: Diagnosis present

## 2019-08-28 DIAGNOSIS — I7100 Dissection of unspecified site of aorta: Secondary | ICD-10-CM | POA: Diagnosis not present

## 2019-08-28 DIAGNOSIS — E1165 Type 2 diabetes mellitus with hyperglycemia: Secondary | ICD-10-CM | POA: Diagnosis present

## 2019-08-28 DIAGNOSIS — I350 Nonrheumatic aortic (valve) stenosis: Secondary | ICD-10-CM | POA: Diagnosis present

## 2019-08-28 DIAGNOSIS — D62 Acute posthemorrhagic anemia: Secondary | ICD-10-CM | POA: Diagnosis not present

## 2019-08-28 DIAGNOSIS — Z885 Allergy status to narcotic agent status: Secondary | ICD-10-CM

## 2019-08-28 DIAGNOSIS — K5790 Diverticulosis of intestine, part unspecified, without perforation or abscess without bleeding: Secondary | ICD-10-CM | POA: Diagnosis present

## 2019-08-28 DIAGNOSIS — I4891 Unspecified atrial fibrillation: Secondary | ICD-10-CM | POA: Diagnosis not present

## 2019-08-28 DIAGNOSIS — Z452 Encounter for adjustment and management of vascular access device: Secondary | ICD-10-CM

## 2019-08-28 DIAGNOSIS — I361 Nonrheumatic tricuspid (valve) insufficiency: Secondary | ICD-10-CM | POA: Diagnosis not present

## 2019-08-28 DIAGNOSIS — J449 Chronic obstructive pulmonary disease, unspecified: Secondary | ICD-10-CM | POA: Diagnosis not present

## 2019-08-28 DIAGNOSIS — R579 Shock, unspecified: Secondary | ICD-10-CM | POA: Diagnosis not present

## 2019-08-28 DIAGNOSIS — J810 Acute pulmonary edema: Secondary | ICD-10-CM | POA: Diagnosis not present

## 2019-08-28 DIAGNOSIS — I952 Hypotension due to drugs: Secondary | ICD-10-CM | POA: Diagnosis not present

## 2019-08-28 DIAGNOSIS — J9601 Acute respiratory failure with hypoxia: Secondary | ICD-10-CM | POA: Diagnosis not present

## 2019-08-28 DIAGNOSIS — M109 Gout, unspecified: Secondary | ICD-10-CM | POA: Diagnosis present

## 2019-08-28 DIAGNOSIS — R069 Unspecified abnormalities of breathing: Secondary | ICD-10-CM

## 2019-08-28 DIAGNOSIS — I152 Hypertension secondary to endocrine disorders: Secondary | ICD-10-CM | POA: Diagnosis present

## 2019-08-28 DIAGNOSIS — R0902 Hypoxemia: Secondary | ICD-10-CM

## 2019-08-28 DIAGNOSIS — J432 Centrilobular emphysema: Secondary | ICD-10-CM | POA: Diagnosis present

## 2019-08-28 DIAGNOSIS — G9511 Acute infarction of spinal cord (embolic) (nonembolic): Secondary | ICD-10-CM | POA: Diagnosis not present

## 2019-08-28 DIAGNOSIS — R079 Chest pain, unspecified: Secondary | ICD-10-CM

## 2019-08-28 DIAGNOSIS — I161 Hypertensive emergency: Secondary | ICD-10-CM | POA: Diagnosis not present

## 2019-08-28 DIAGNOSIS — F329 Major depressive disorder, single episode, unspecified: Secondary | ICD-10-CM | POA: Diagnosis present

## 2019-08-28 DIAGNOSIS — Z9889 Other specified postprocedural states: Secondary | ICD-10-CM | POA: Diagnosis not present

## 2019-08-28 DIAGNOSIS — I71019 Dissection of thoracic aorta, unspecified: Secondary | ICD-10-CM

## 2019-08-28 DIAGNOSIS — I7102 Dissection of abdominal aorta: Secondary | ICD-10-CM | POA: Diagnosis not present

## 2019-08-28 DIAGNOSIS — M1990 Unspecified osteoarthritis, unspecified site: Secondary | ICD-10-CM | POA: Diagnosis present

## 2019-08-28 DIAGNOSIS — E1169 Type 2 diabetes mellitus with other specified complication: Secondary | ICD-10-CM | POA: Diagnosis present

## 2019-08-28 DIAGNOSIS — Z8249 Family history of ischemic heart disease and other diseases of the circulatory system: Secondary | ICD-10-CM

## 2019-08-28 DIAGNOSIS — Z7984 Long term (current) use of oral hypoglycemic drugs: Secondary | ICD-10-CM

## 2019-08-28 DIAGNOSIS — Z20822 Contact with and (suspected) exposure to covid-19: Secondary | ICD-10-CM | POA: Diagnosis present

## 2019-08-28 DIAGNOSIS — I7101 Dissection of thoracic aorta: Secondary | ICD-10-CM | POA: Diagnosis present

## 2019-08-28 DIAGNOSIS — I719 Aortic aneurysm of unspecified site, without rupture: Secondary | ICD-10-CM | POA: Diagnosis present

## 2019-08-28 DIAGNOSIS — I495 Sick sinus syndrome: Secondary | ICD-10-CM | POA: Diagnosis present

## 2019-08-28 DIAGNOSIS — G839 Paralytic syndrome, unspecified: Secondary | ICD-10-CM | POA: Diagnosis not present

## 2019-08-28 DIAGNOSIS — F419 Anxiety disorder, unspecified: Secondary | ICD-10-CM | POA: Diagnosis present

## 2019-08-28 DIAGNOSIS — G8929 Other chronic pain: Secondary | ICD-10-CM | POA: Diagnosis present

## 2019-08-28 DIAGNOSIS — Z8042 Family history of malignant neoplasm of prostate: Secondary | ICD-10-CM

## 2019-08-28 DIAGNOSIS — Z882 Allergy status to sulfonamides status: Secondary | ICD-10-CM

## 2019-08-28 DIAGNOSIS — Z7982 Long term (current) use of aspirin: Secondary | ICD-10-CM

## 2019-08-28 DIAGNOSIS — I10 Essential (primary) hypertension: Secondary | ICD-10-CM | POA: Diagnosis present

## 2019-08-28 DIAGNOSIS — Z79899 Other long term (current) drug therapy: Secondary | ICD-10-CM

## 2019-08-28 DIAGNOSIS — L299 Pruritus, unspecified: Secondary | ICD-10-CM | POA: Diagnosis not present

## 2019-08-28 DIAGNOSIS — I712 Thoracic aortic aneurysm, without rupture: Principal | ICD-10-CM | POA: Diagnosis present

## 2019-08-28 DIAGNOSIS — I480 Paroxysmal atrial fibrillation: Secondary | ICD-10-CM | POA: Diagnosis not present

## 2019-08-28 DIAGNOSIS — E785 Hyperlipidemia, unspecified: Secondary | ICD-10-CM | POA: Diagnosis present

## 2019-08-28 DIAGNOSIS — K219 Gastro-esophageal reflux disease without esophagitis: Secondary | ICD-10-CM | POA: Diagnosis present

## 2019-08-28 DIAGNOSIS — E1159 Type 2 diabetes mellitus with other circulatory complications: Secondary | ICD-10-CM | POA: Diagnosis not present

## 2019-08-28 DIAGNOSIS — K5903 Drug induced constipation: Secondary | ICD-10-CM | POA: Diagnosis not present

## 2019-08-28 DIAGNOSIS — E876 Hypokalemia: Secondary | ICD-10-CM | POA: Diagnosis not present

## 2019-08-28 DIAGNOSIS — E039 Hypothyroidism, unspecified: Secondary | ICD-10-CM | POA: Diagnosis present

## 2019-08-28 DIAGNOSIS — G4733 Obstructive sleep apnea (adult) (pediatric): Secondary | ICD-10-CM | POA: Diagnosis present

## 2019-08-28 DIAGNOSIS — M7989 Other specified soft tissue disorders: Secondary | ICD-10-CM | POA: Diagnosis not present

## 2019-08-28 DIAGNOSIS — K5900 Constipation, unspecified: Secondary | ICD-10-CM | POA: Diagnosis not present

## 2019-08-28 DIAGNOSIS — Z888 Allergy status to other drugs, medicaments and biological substances status: Secondary | ICD-10-CM

## 2019-08-28 DIAGNOSIS — T465X1A Poisoning by other antihypertensive drugs, accidental (unintentional), initial encounter: Secondary | ICD-10-CM | POA: Diagnosis not present

## 2019-08-28 DIAGNOSIS — R519 Headache, unspecified: Secondary | ICD-10-CM | POA: Diagnosis not present

## 2019-08-28 DIAGNOSIS — E7800 Pure hypercholesterolemia, unspecified: Secondary | ICD-10-CM | POA: Diagnosis present

## 2019-08-28 DIAGNOSIS — Z79890 Hormone replacement therapy (postmenopausal): Secondary | ICD-10-CM

## 2019-08-28 DIAGNOSIS — Z880 Allergy status to penicillin: Secondary | ICD-10-CM

## 2019-08-28 DIAGNOSIS — Z800 Family history of malignant neoplasm of digestive organs: Secondary | ICD-10-CM

## 2019-08-28 DIAGNOSIS — M549 Dorsalgia, unspecified: Secondary | ICD-10-CM | POA: Diagnosis present

## 2019-08-28 DIAGNOSIS — Z950 Presence of cardiac pacemaker: Secondary | ICD-10-CM

## 2019-08-28 DIAGNOSIS — T465X5A Adverse effect of other antihypertensive drugs, initial encounter: Secondary | ICD-10-CM | POA: Diagnosis not present

## 2019-08-28 DIAGNOSIS — I701 Atherosclerosis of renal artery: Secondary | ICD-10-CM | POA: Diagnosis present

## 2019-08-28 DIAGNOSIS — I808 Phlebitis and thrombophlebitis of other sites: Secondary | ICD-10-CM | POA: Diagnosis not present

## 2019-08-28 DIAGNOSIS — Z886 Allergy status to analgesic agent status: Secondary | ICD-10-CM

## 2019-08-28 DIAGNOSIS — N179 Acute kidney failure, unspecified: Secondary | ICD-10-CM | POA: Diagnosis not present

## 2019-08-28 DIAGNOSIS — T502X5A Adverse effect of carbonic-anhydrase inhibitors, benzothiadiazides and other diuretics, initial encounter: Secondary | ICD-10-CM | POA: Diagnosis not present

## 2019-08-28 LAB — HEPATIC FUNCTION PANEL
ALT: 19 U/L (ref 0–44)
AST: 21 U/L (ref 15–41)
Albumin: 3.7 g/dL (ref 3.5–5.0)
Alkaline Phosphatase: 78 U/L (ref 38–126)
Bilirubin, Direct: 0.1 mg/dL (ref 0.0–0.2)
Indirect Bilirubin: 0.4 mg/dL (ref 0.3–0.9)
Total Bilirubin: 0.5 mg/dL (ref 0.3–1.2)
Total Protein: 7.3 g/dL (ref 6.5–8.1)

## 2019-08-28 LAB — BASIC METABOLIC PANEL
Anion gap: 11 (ref 5–15)
BUN: 16 mg/dL (ref 8–23)
CO2: 27 mmol/L (ref 22–32)
Calcium: 9.3 mg/dL (ref 8.9–10.3)
Chloride: 102 mmol/L (ref 98–111)
Creatinine, Ser: 0.9 mg/dL (ref 0.44–1.00)
GFR calc Af Amer: >60 mL/min (ref >60)
GFR calc non Af Amer: >60 mL/min (ref >60)
Glucose, Bld: 200 mg/dL — ABNORMAL HIGH (ref 70–99)
Potassium: 3.7 mmol/L (ref 3.5–5.1)
Sodium: 140 mmol/L (ref 135–145)

## 2019-08-28 LAB — CBC
HCT: 37.3 % (ref 36.0–46.0)
Hemoglobin: 11.6 g/dL — ABNORMAL LOW (ref 12.0–15.0)
MCH: 26.1 pg (ref 26.0–34.0)
MCHC: 31.1 g/dL (ref 30.0–36.0)
MCV: 84 fL (ref 80.0–100.0)
Platelets: 291 10*3/uL (ref 150–400)
RBC: 4.44 MIL/uL (ref 3.87–5.11)
RDW: 22.2 % — ABNORMAL HIGH (ref 11.5–15.5)
WBC: 8.1 10*3/uL (ref 4.0–10.5)
nRBC: 0 % (ref 0.0–0.2)

## 2019-08-28 LAB — TYPE AND SCREEN
ABO/RH(D): O NEG
Antibody Screen: NEGATIVE

## 2019-08-28 LAB — GLUCOSE, CAPILLARY
Glucose-Capillary: 132 mg/dL — ABNORMAL HIGH (ref 70–99)
Glucose-Capillary: 158 mg/dL — ABNORMAL HIGH (ref 70–99)

## 2019-08-28 LAB — TROPONIN I (HIGH SENSITIVITY)
Troponin I (High Sensitivity): 10 ng/L (ref <18)
Troponin I (High Sensitivity): 9 ng/L (ref <18)

## 2019-08-28 LAB — HEMOGLOBIN A1C
Hgb A1c MFr Bld: 6.9 % — ABNORMAL HIGH (ref 4.8–5.6)
Mean Plasma Glucose: 151.33 mg/dL

## 2019-08-28 LAB — LIPASE, BLOOD: Lipase: 32 U/L (ref 11–51)

## 2019-08-28 LAB — HIV ANTIBODY (ROUTINE TESTING W REFLEX): HIV Screen 4th Generation wRfx: NONREACTIVE

## 2019-08-28 LAB — MRSA PCR SCREENING: MRSA by PCR: NEGATIVE

## 2019-08-28 LAB — SARS CORONAVIRUS 2 BY RT PCR (HOSPITAL ORDER, PERFORMED IN ~~LOC~~ HOSPITAL LAB): SARS Coronavirus 2: NEGATIVE

## 2019-08-28 LAB — TSH: TSH: 0.121 u[IU]/mL — ABNORMAL LOW (ref 0.350–4.500)

## 2019-08-28 LAB — ABO/RH: ABO/RH(D): O NEG

## 2019-08-28 MED ORDER — HYDRALAZINE HCL 20 MG/ML IJ SOLN
5.0000 mg | Freq: Once | INTRAMUSCULAR | Status: AC
  Administered 2019-08-28: 5 mg via INTRAVENOUS

## 2019-08-28 MED ORDER — HYDROMORPHONE HCL 1 MG/ML IJ SOLN
0.5000 mg | Freq: Once | INTRAMUSCULAR | Status: AC
  Administered 2019-08-28: 0.5 mg via INTRAVENOUS
  Filled 2019-08-28: qty 0.5

## 2019-08-28 MED ORDER — HYDROMORPHONE HCL 1 MG/ML IJ SOLN
1.0000 mg | Freq: Once | INTRAMUSCULAR | Status: AC
  Administered 2019-08-28: 1 mg via INTRAVENOUS
  Filled 2019-08-28: qty 1

## 2019-08-28 MED ORDER — CLEVIDIPINE BUTYRATE 0.5 MG/ML IV EMUL
0.0000 mg/h | INTRAVENOUS | Status: DC
  Administered 2019-08-28 (×2): 18 mg/h via INTRAVENOUS
  Administered 2019-08-28: 1 mg/h via INTRAVENOUS
  Administered 2019-08-28: 13 mg/h via INTRAVENOUS
  Administered 2019-08-28: 15 mg/h via INTRAVENOUS
  Administered 2019-08-29 (×2): 19 mg/h via INTRAVENOUS
  Administered 2019-08-29: 17 mg/h via INTRAVENOUS
  Administered 2019-08-29: 6 mg/h via INTRAVENOUS
  Administered 2019-08-29: 14 mg/h via INTRAVENOUS
  Administered 2019-08-29: 8 mg/h via INTRAVENOUS
  Administered 2019-08-29: 18 mg/h via INTRAVENOUS
  Administered 2019-08-29: 19 mg/h via INTRAVENOUS
  Administered 2019-08-29: 13 mg/h via INTRAVENOUS
  Administered 2019-08-29: 17 mg/h via INTRAVENOUS
  Administered 2019-08-29: 19 mg/h via INTRAVENOUS
  Administered 2019-08-29: 9 mg/h via INTRAVENOUS
  Administered 2019-08-30: 12 mg/h via INTRAVENOUS
  Administered 2019-08-30 (×2): 14 mg/h via INTRAVENOUS
  Administered 2019-08-30: 8 mg/h via INTRAVENOUS
  Administered 2019-08-30 – 2019-08-31 (×3): 12 mg/h via INTRAVENOUS
  Administered 2019-08-31: 13 mg/h via INTRAVENOUS
  Administered 2019-08-31: 14 mg/h via INTRAVENOUS
  Administered 2019-08-31: 10 mg/h via INTRAVENOUS
  Administered 2019-08-31: 12 mg/h via INTRAVENOUS
  Administered 2019-08-31: 13 mg/h via INTRAVENOUS
  Administered 2019-09-01: 7 mg/h via INTRAVENOUS
  Administered 2019-09-01: 8 mg/h via INTRAVENOUS
  Administered 2019-09-01: 10 mg/h via INTRAVENOUS
  Administered 2019-09-02: 4 mg/h via INTRAVENOUS
  Administered 2019-09-03: 7 mg/h via INTRAVENOUS
  Administered 2019-09-03: 11 mg/h via INTRAVENOUS
  Administered 2019-09-03 – 2019-09-04 (×4): 9 mg/h via INTRAVENOUS
  Administered 2019-09-04: 12 mg/h via INTRAVENOUS
  Administered 2019-09-05: 13 mg/h via INTRAVENOUS
  Administered 2019-09-05: 14 mg/h via INTRAVENOUS
  Administered 2019-09-05: 13 mg/h via INTRAVENOUS
  Administered 2019-09-05: 14 mg/h via INTRAVENOUS
  Administered 2019-09-05: 10 mg/h via INTRAVENOUS
  Administered 2019-09-05 – 2019-09-06 (×2): 11 mg/h via INTRAVENOUS
  Administered 2019-09-06: 16 mg/h via INTRAVENOUS
  Administered 2019-09-06: 13 mg/h via INTRAVENOUS
  Administered 2019-09-06: 11 mg/h via INTRAVENOUS
  Administered 2019-09-06: 8 mg/h via INTRAVENOUS
  Administered 2019-09-07 (×2): 14 mg/h via INTRAVENOUS
  Administered 2019-09-07 (×2): 12 mg/h via INTRAVENOUS
  Administered 2019-09-07: 15 mg/h via INTRAVENOUS
  Administered 2019-09-07: 13 mg/h via INTRAVENOUS
  Administered 2019-09-08: 4 mg/h via INTRAVENOUS
  Administered 2019-09-08: 14 mg/h via INTRAVENOUS
  Administered 2019-09-08: 13 mg/h via INTRAVENOUS
  Administered 2019-09-08: 15 mg/h via INTRAVENOUS
  Administered 2019-09-08: 13 mg/h via INTRAVENOUS
  Administered 2019-09-09: 9 mg/h via INTRAVENOUS
  Administered 2019-09-10: 14 mg/h via INTRAVENOUS
  Administered 2019-09-10: 18 mg/h via INTRAVENOUS
  Administered 2019-09-10: 13 mg/h via INTRAVENOUS
  Administered 2019-09-10 (×2): 18 mg/h via INTRAVENOUS
  Administered 2019-09-10: 12 mg/h via INTRAVENOUS
  Filled 2019-08-28 (×7): qty 100
  Filled 2019-08-28 (×2): qty 50
  Filled 2019-08-28 (×4): qty 100
  Filled 2019-08-28: qty 50
  Filled 2019-08-28: qty 100
  Filled 2019-08-28: qty 50
  Filled 2019-08-28 (×9): qty 100
  Filled 2019-08-28 (×2): qty 50
  Filled 2019-08-28 (×4): qty 100
  Filled 2019-08-28: qty 50
  Filled 2019-08-28 (×3): qty 100
  Filled 2019-08-28 (×2): qty 50
  Filled 2019-08-28: qty 100
  Filled 2019-08-28: qty 50
  Filled 2019-08-28 (×5): qty 100
  Filled 2019-08-28: qty 50
  Filled 2019-08-28 (×3): qty 100
  Filled 2019-08-28: qty 50
  Filled 2019-08-28 (×5): qty 100
  Filled 2019-08-28: qty 50
  Filled 2019-08-28 (×3): qty 100
  Filled 2019-08-28: qty 50
  Filled 2019-08-28 (×3): qty 100
  Filled 2019-08-28: qty 50
  Filled 2019-08-28: qty 100
  Filled 2019-08-28: qty 50
  Filled 2019-08-28: qty 100
  Filled 2019-08-28: qty 50
  Filled 2019-08-28 (×4): qty 100
  Filled 2019-08-28: qty 50
  Filled 2019-08-28 (×2): qty 100
  Filled 2019-08-28: qty 50
  Filled 2019-08-28 (×8): qty 100

## 2019-08-28 MED ORDER — NITROGLYCERIN 0.4 MG SL SUBL
0.4000 mg | SUBLINGUAL_TABLET | Freq: Once | SUBLINGUAL | Status: AC
  Administered 2019-08-28: 0.4 mg via SUBLINGUAL
  Filled 2019-08-28: qty 1

## 2019-08-28 MED ORDER — PANTOPRAZOLE SODIUM 40 MG IV SOLR
40.0000 mg | Freq: Once | INTRAVENOUS | Status: AC
  Administered 2019-08-28: 40 mg via INTRAVENOUS
  Filled 2019-08-28: qty 40

## 2019-08-28 MED ORDER — FENTANYL CITRATE (PF) 100 MCG/2ML IJ SOLN
25.0000 ug | INTRAMUSCULAR | Status: DC | PRN
  Administered 2019-08-28 – 2019-08-29 (×5): 100 ug via INTRAVENOUS
  Filled 2019-08-28 (×5): qty 2

## 2019-08-28 MED ORDER — INSULIN ASPART 100 UNIT/ML ~~LOC~~ SOLN
0.0000 [IU] | SUBCUTANEOUS | Status: DC
  Administered 2019-08-28: 2 [IU] via SUBCUTANEOUS
  Administered 2019-08-28: 3 [IU] via SUBCUTANEOUS
  Administered 2019-08-29: 2 [IU] via SUBCUTANEOUS

## 2019-08-28 MED ORDER — FENTANYL CITRATE (PF) 100 MCG/2ML IJ SOLN
50.0000 ug | Freq: Once | INTRAMUSCULAR | Status: AC
  Administered 2019-08-28: 50 ug via INTRAVENOUS
  Filled 2019-08-28: qty 2

## 2019-08-28 MED ORDER — FENTANYL CITRATE (PF) 100 MCG/2ML IJ SOLN: 50.0000 ug | Freq: Once | INTRAMUSCULAR | Status: AC

## 2019-08-28 MED ORDER — LORAZEPAM 2 MG/ML IJ SOLN
1.0000 mg | Freq: Once | INTRAMUSCULAR | Status: AC
  Administered 2019-08-28: 1 mg via INTRAVENOUS
  Filled 2019-08-28: qty 1

## 2019-08-28 MED ORDER — PANTOPRAZOLE SODIUM 40 MG IV SOLR
40.0000 mg | INTRAVENOUS | Status: DC
  Administered 2019-08-28: 40 mg via INTRAVENOUS
  Filled 2019-08-28: qty 40

## 2019-08-28 MED ORDER — ESMOLOL HCL-SODIUM CHLORIDE 2000 MG/100ML IV SOLN
25.0000 ug/kg/min | INTRAVENOUS | Status: DC
  Administered 2019-08-28: 25 ug/kg/min via INTRAVENOUS
  Administered 2019-08-28: 250 ug/kg/min via INTRAVENOUS
  Administered 2019-08-28: 200 ug/kg/min via INTRAVENOUS
  Administered 2019-08-29: 225 ug/kg/min via INTRAVENOUS
  Administered 2019-08-29 (×3): 275 ug/kg/min via INTRAVENOUS
  Administered 2019-08-29: 50 ug/kg/min via INTRAVENOUS
  Administered 2019-08-29: 250 ug/kg/min via INTRAVENOUS
  Administered 2019-08-30 (×2): 100 ug/kg/min via INTRAVENOUS
  Administered 2019-08-30: 75 ug/kg/min via INTRAVENOUS
  Administered 2019-08-30 – 2019-08-31 (×2): 100 ug/kg/min via INTRAVENOUS
  Administered 2019-08-31: 75 ug/kg/min via INTRAVENOUS
  Administered 2019-08-31 (×2): 100 ug/kg/min via INTRAVENOUS
  Administered 2019-09-01: 55 ug/kg/min via INTRAVENOUS
  Administered 2019-09-01: 70 ug/kg/min via INTRAVENOUS
  Administered 2019-09-01: 70.259 ug/kg/min via INTRAVENOUS
  Filled 2019-08-28 (×23): qty 100

## 2019-08-28 MED ORDER — LEVOTHYROXINE SODIUM 25 MCG PO TABS: 200.0000 ug | ORAL_TABLET | Freq: Every day | ORAL | Status: DC

## 2019-08-28 MED ORDER — FENTANYL CITRATE (PF) 100 MCG/2ML IJ SOLN
INTRAMUSCULAR | Status: AC
  Administered 2019-08-28: 50 ug via INTRAVENOUS
  Filled 2019-08-28: qty 2

## 2019-08-28 MED ORDER — ONDANSETRON HCL 4 MG/2ML IJ SOLN
4.0000 mg | Freq: Once | INTRAMUSCULAR | Status: AC
  Administered 2019-08-28: 4 mg via INTRAVENOUS
  Filled 2019-08-28: qty 2

## 2019-08-28 MED ORDER — CHLORHEXIDINE GLUCONATE CLOTH 2 % EX PADS
6.0000 | MEDICATED_PAD | Freq: Every day | CUTANEOUS | Status: DC
  Administered 2019-08-29 – 2019-09-17 (×15): 6 via TOPICAL

## 2019-08-28 MED ORDER — DOCUSATE SODIUM 100 MG PO CAPS
100.0000 mg | ORAL_CAPSULE | Freq: Two times a day (BID) | ORAL | Status: DC | PRN
  Filled 2019-08-28: qty 1

## 2019-08-28 MED ORDER — LABETALOL HCL 5 MG/ML IV SOLN
20.0000 mg | Freq: Once | INTRAVENOUS | Status: AC
  Administered 2019-08-28: 20 mg via INTRAVENOUS
  Filled 2019-08-28: qty 4

## 2019-08-28 MED ORDER — LEVOTHYROXINE SODIUM 150 MCG PO TABS
150.0000 ug | ORAL_TABLET | Freq: Every day | ORAL | Status: DC
  Administered 2019-08-29: 150 ug via ORAL
  Filled 2019-08-28: qty 1

## 2019-08-28 MED ORDER — NICARDIPINE HCL IN NACL 20-0.86 MG/200ML-% IV SOLN
3.0000 mg/h | INTRAVENOUS | Status: DC
  Administered 2019-08-28: 12.5 mg/h via INTRAVENOUS
  Administered 2019-08-28: 5 mg/h via INTRAVENOUS
  Filled 2019-08-28 (×3): qty 200

## 2019-08-28 MED ORDER — IOHEXOL 350 MG/ML SOLN
80.0000 mL | Freq: Once | INTRAVENOUS | Status: AC | PRN
  Administered 2019-08-28: 80 mL via INTRAVENOUS

## 2019-08-28 MED ORDER — HYDRALAZINE HCL 20 MG/ML IJ SOLN
10.0000 mg | Freq: Once | INTRAMUSCULAR | Status: DC
  Filled 2019-08-28: qty 1

## 2019-08-28 MED ORDER — POLYETHYLENE GLYCOL 3350 17 G PO PACK: 17.0000 g | PACK | Freq: Every day | ORAL | Status: DC | PRN

## 2019-08-28 MED ORDER — CLONIDINE HCL 0.2 MG PO TABS
0.2000 mg | ORAL_TABLET | Freq: Three times a day (TID) | ORAL | Status: DC
  Administered 2019-08-28 (×2): 0.2 mg via ORAL
  Filled 2019-08-28 (×2): qty 1

## 2019-08-28 MED ORDER — OXYCODONE HCL 5 MG PO TABS
5.0000 mg | ORAL_TABLET | Freq: Once | ORAL | Status: AC | PRN
  Administered 2019-08-28: 5 mg via ORAL
  Filled 2019-08-28: qty 1

## 2019-08-28 MED ORDER — LABETALOL HCL 5 MG/ML IV SOLN
40.0000 mg | Freq: Once | INTRAVENOUS | Status: AC
  Administered 2019-08-28: 40 mg via INTRAVENOUS
  Filled 2019-08-28: qty 8

## 2019-08-28 NOTE — Progress Notes (Signed)
[place] Progress Note Patient Name: [name] DOB: Feb 22, 1950 MRN: [id number]   Date of Service  08/28/2019  HPI/Events of Note  Pain in abdomen, received fenta 100 and on q2 hr fenta prn.Home meds: she takes Oxycodone 10 4 timea a day for her back pain. On 1 to 2 lit o2. Alert. O x 3.  VS stable as per bed side RN.  Vascular surgeon note as below. 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  Maximum aortic diameter is 3.7 cm.  The patient does not have any symptoms of malperfusion.  Therefore we will initially treat her medically with blood pressure control with a target systolic blood pressure less than 130.  She will also receive IV medication for pain control.  Ideally if we can get her blood pressure and pain under control, I would consider endovascular repair in approximately 2 weeks.  If she has persistent pain, repair would be considered at an earlier date.  I plan on repeating her CT scan in approximately 48 hours.  eICU Interventions  - Oxycodone 5 mg IR once prn trial for now.      Intervention Category Intermediate Interventions: Pain - evaluation and management  Elmer Sow 08/28/2019, 8:38 PM

## 2019-08-28 NOTE — ED Notes (Signed)
Report called to 48M

## 2019-08-28 NOTE — ED Notes (Signed)
EDP at bedside to assess pt 

## 2019-08-28 NOTE — Progress Notes (Signed)
[facility] Physician-Brief Progress Note Patient Name: Quenesha Douglass DOB: [birth year] MRN: [id number]   Date of Service  08/28/2019  HPI/Events of Note  New left lateral abd pain. Can she have ice chips.  eICU Interventions  Watch for now. Getting oxycodone IR. If worsening to call back. - ok to have ice chips, asp precautions.      Intervention Category Intermediate Interventions: Pain - evaluation and management  Elmer Sow 08/28/2019, 8:58 PM

## 2019-08-28 NOTE — ED Notes (Addendum)
Pt called out and states pain is worse. RN at bedside. Pt states pain is in center of chest and radiates to her back. EDP made aware pt pain is mid chest and getting worse, radiating to her back, BP 236/115. EKG resent. Orders received for CT.

## 2019-08-28 NOTE — Progress Notes (Signed)
Providers notified:  Pt still 10/10 pain despite 175mcg fent @ 1600.  Clev gtt @ 10 & Esm gtt @ 125.  She is requesting something else for pain.  Received order for 0.5 dil *1

## 2019-08-28 NOTE — ED Provider Notes (Signed)
[facility] EMERGENCY DEPARTMENT Provider Note   CSN: [id number] Arrival date & time: 08/28/19  8315     History Chief Complaint  Patient presents with  . Chest Pain    Kida Digiulio is a 69 y.o. female w PMHx COPD, DM, a fib, HTN, presenting to the ED via EMS with complaint of sharp central chest pain that began about 2 hours prior to arrival. Pt reports she was laying down resting when symptoms began. Pain radiates to her back, with associated nausea, vomiting, upper abdominal pain. She feels short of breath. She denies diaphoresis or radiation to arms/neck/jaw. She has never had similar symptoms. She has not taken her BP medications today, though did take it yesterday.  The history is provided by the patient.       Past Medical History:  Diagnosis Date  . Anemia   . Anxiety   . Arthritis   . Atrial fib/flutter, transient   . COPD (chronic obstructive pulmonary disease) ([place])   . Depression   . Diabetes mellitus   . Diverticulosis   . DJD (degenerative joint disease)   . GERD (gastroesophageal reflux disease)   . Gout   . History of pneumonia   . Hypertension   . Hypothyroidism   . Internal hemorrhoids     Patient Active Problem List   Diagnosis Date Noted  . Hypokalemia 05/06/2017  . Abdominal pain 05/06/2017  . Atrial fibrillation ([place]) 03/21/2014  . Enlarged lymph node 12/29/2011  . OTHER&UNSPECIFIED DISEASES THE ORAL SOFT TISSUES 08/17/2008  . GOUT 07/10/2008  . ANKLE PAIN, RIGHT 07/10/2008  . ASTHMATIC BRONCHITIS, ACUTE 05/10/2008  . ABDOMINAL PAIN, UNSPECIFIED SITE 02/16/2008  . KNEE PAIN, BILATERAL 06/17/2007  . DEPRESSIVE DISORDER 12/25/2006  . Pain in limb 12/25/2006  . Lipoma of unspecified site 09/17/2006  . Hypothyroidism 09/17/2006  . Type 2 diabetes mellitus with hyperlipidemia ([place]) 09/17/2006  . OBSTRUCTIVE SLEEP APNEA 09/17/2006  . Essential hypertension 09/17/2006  . BRADYCARDIA, CHRONIC 09/17/2006  . HERNIA, SITE NOS W/O  OBSTRUCTION/GANGRENE 09/17/2006  . DEGENERATIVE JOINT DISEASE, HANDS 09/17/2006  . 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 CATARACT EXTRACTION W/ INTRAOCULAR LENS IMPLANT Left   . HERNIA REPAIR    . PACEMAKER INSERTION    . TUBAL LIGATION    . VAGINAL HYSTERECTOMY       OB History    Gravida  3   Para      Term      Preterm      AB  1   Living  2     SAB      TAB  1   Ectopic      Multiple      Live Births              Family History  Problem Relation Age of Onset  . Prostate cancer Father   . Hypertension Mother   . Heart attack Mother   . Heart disease Mother   . Stomach cancer Son   . Diabetes Maternal Aunt   . Heart disease Maternal Uncle   . Colon cancer Neg Hx   . Esophageal cancer Neg Hx   . Rectal cancer Neg Hx     Social History   Tobacco Use  . Smoking status: Current Some Day Smoker    Packs/day: 0.50    Years: 30.00    Pack years: 15.00    Types: Cigarettes  . Smokeless tobacco: Never  Used  Substance Use Topics  . Alcohol use: No  . Drug use: No    Home Medications Prior to Admission medications   Medication Sig Start Date End Date Taking? Authorizing Provider  aspirin 81 MG tablet Take 81 mg by mouth every morning.    Yes [provider]  carvedilol (COREG) 25 MG tablet Take 25 mg by mouth 2 (two) times daily with a meal.     Yes [provider]  cholecalciferol (VITAMIN D) 1000 UNITS tablet Take 1,000 Units by mouth every morning.    Yes [provider]  cloNIDine (CATAPRES) 0.2 MG tablet Take 0.2 mg by mouth 3 (three) times daily.     Yes [provider]  hydrALAZINE (APRESOLINE) 100 MG tablet Take 100 mg by mouth 3 (three) times daily. 07/28/19  Yes [provider]  hydrochlorothiazide (HYDRODIURIL) 12.5 MG tablet Take 1 tablet (12.5 mg total) by mouth daily. 05/25/16  Yes Noemi Chapel, MD  levothyroxine (SYNTHROID) 200 MCG tablet Take 200  mcg by mouth daily. 08/15/19  Yes [provider]  lovastatin (MEVACOR) 20 MG tablet Take 20 mg by mouth at bedtime.   Yes [provider]  metFORMIN (GLUCOPHAGE) 500 MG tablet Take 500 mg by mouth 2 (two) times daily.   Yes [provider]  Multiple Vitamin (MULTIVITAMIN) tablet Take 1 tablet by mouth every morning.    Yes [provider]  Oxycodone HCl 10 MG TABS Take 10 mg by mouth 4 (four) times daily as needed. 08/08/19  Yes [provider]  polyethylene glycol (MIRALAX / GLYCOLAX) packet Take 17 g by mouth 2 (two) times daily.    Yes [provider]  potassium chloride SA (KLOR-CON) 20 MEQ tablet Take 20 mEq by mouth daily. 08/15/19  Yes [provider]  sertraline (ZOLOFT) 100 MG tablet Take 50 mg by mouth at bedtime.    Yes [provider]    Allergies    Lisinopril, Codeine phosphate, Morphine and related, Naproxen, Nsaids, Penicillins, Propoxyphene n-acetaminophen, Sulfamethoxazole-trimethoprim, and Flexeril [cyclobenzaprine]  Review of Systems   Review of Systems  Respiratory: Positive for shortness of breath.   Cardiovascular: Positive for chest pain.  Gastrointestinal: Positive for nausea and vomiting.  Neurological: Negative for weakness and numbness.  All other systems reviewed and are negative.   Physical Exam Updated Vital Signs BP 158/117   Pulse 54   Temp 98.9 F (37.2 C) (Oral)   Resp 20   Ht 5' (1.524 m)   Wt 63 kg   SpO2 100%   BMI 27.15 kg/m   Physical Exam Vitals and nursing note reviewed.  Constitutional:      Appearance: She is well-developed.     Comments: Pt appears very uncomfortable, she is actively retching on evaluation- NBNB emesis  HENT:     Head: Normocephalic and atraumatic.  Eyes:     Conjunctiva/sclera: Conjunctivae normal.  Cardiovascular:     Rate and Rhythm: Regular rhythm. Bradycardia present.     Comments: Equal radial pulses BUE Pulmonary:     Effort:  Pulmonary effort is normal. No respiratory distress.     Breath sounds: Normal breath sounds.  Abdominal:     General: Bowel sounds are normal.     Palpations: Abdomen is soft. There is no mass.     Tenderness: There is abdominal tenderness.  Skin:    General: Skin is warm.  Neurological:     Mental Status: She is alert.  Psychiatric:  Mood and Affect: Mood normal.        Behavior: Behavior normal.     ED Results / Procedures / Treatments   Labs (all labs ordered are listed, but only abnormal results are displayed) Labs Reviewed  BASIC METABOLIC PANEL - Abnormal; Notable for the following components:      Result Value   Glucose, Bld 200 (*)    All other components within normal limits  CBC - Abnormal; Notable for the following components:   Hemoglobin 11.6 (*)    RDW 22.2 (*)    All other components within normal limits  SARS CORONAVIRUS 2 BY RT PCR (HOSPITAL ORDER, [facility])  HEPATIC FUNCTION PANEL  LIPASE, BLOOD  TYPE AND SCREEN  ABO/RH  TROPONIN I (HIGH SENSITIVITY)  TROPONIN I (HIGH SENSITIVITY)    EKG EKG Interpretation  Date/Time:  Sunday August 28 2019 10:09:06 EDT Ventricular Rate:  71 PR Interval:    QRS Duration: 89 QT Interval:  418 QTC Calculation: 455 R Axis:   -34 Text Interpretation: Sinus rhythm Prolonged PR interval Left axis deviation Borderline T abnormalities, anterior leads Confirmed by Milton Ferguson [phone]) on 08/28/2019 10:51:45 AM   Radiology DG Chest 2 View  Result Date: 08/28/2019 CLINICAL DATA:  Chest pain EXAM: CHEST - 2 VIEW COMPARISON:  05/27/2016 FINDINGS: Mild cardiomegaly with right chest multi lead pacer fibrillator. Both lungs are clear. Disc degenerative disease thoracic spine. IMPRESSION: Mild cardiomegaly without acute abnormality of the lungs. Electronically Signed   By: Eddie Candle M.D.   On: 08/28/2019 10:56   CT Angio Chest/Abd/Pel for Dissection W and/or W/WO  Result Date:  08/28/2019 CLINICAL DATA:  Chest pain, aortic dissection suspected EXAM: CT ANGIOGRAPHY CHEST, ABDOMEN AND PELVIS TECHNIQUE: Non-contrast CT of the chest was initially obtained. Multidetector CT imaging through the chest, abdomen and pelvis was performed using the standard protocol during bolus administration of intravenous contrast. Multiplanar reconstructed images and MIPs were obtained and reviewed to evaluate the vascular anatomy. CONTRAST:  79mL OMNIPAQUE IOHEXOL 350 MG/ML SOLN COMPARISON:  None. FINDINGS: CTA CHEST FINDINGS Cardiovascular: Preferential opacification of the thoracic aorta. Aortic atherosclerosis. There is a hyperdense, acute appearing intramural hematoma of the distal aortic arch and descending thoracic aorta (originating at or distal to the origin of the left subclavian artery), the vessel caliber aneurysmal, measuring up to 3.7 x 3.5 cm (series 5, image 46). Thickness of hematoma measures up to 1.3 cm. There is a small ulceration at the medial aspect of the proximal descending thoracic aorta, measuring no greater than 5 mm (series 5, image 38). There is no other contrast opacification the ascending thoracic aorta and arch are normal in caliber. Normal heart size. Scattered coronary artery calcifications. No pericardial effusion. Mediastinum/Nodes: No enlarged mediastinal, hilar, or axillary lymph nodes. Thyroid gland, trachea, and esophagus demonstrate no significant findings. Lungs/Pleura: Mild centrilobular emphysema. Bandlike scarring and or atelectasis of the bilateral lung bases. No pleural effusion or pneumothorax. Musculoskeletal: No chest wall abnormality. No acute or significant osseous findings. Review of the MIP images confirms the above findings. CTA ABDOMEN AND PELVIS FINDINGS VASCULAR Intramural hematoma described above extends to approximately the level of the right renal artery, without evident involvement of the branch vessel origins. The abdominal aorta is nonaneurysmal.  Aortic atherosclerosis. Standard branching pattern of the abdominal aorta with solitary bilateral renal arteries. Review of the MIP images confirms the above findings. NON-VASCULAR Hepatobiliary: No solid liver abnormality is seen. No gallstones, gallbladder wall thickening, or biliary  dilatation. Pancreas: Unremarkable. No pancreatic ductal dilatation or surrounding inflammatory changes. Spleen: Normal in size without significant abnormality. Adrenals/Urinary Tract: Adrenal glands are unremarkable. Kidneys are normal, without renal calculi, solid lesion, or hydronephrosis. Bladder is unremarkable. Stomach/Bowel: Stomach is within normal limits. Appendix appears normal. No evidence of bowel wall thickening, distention, or inflammatory changes. Lymphatic: No enlarged abdominal or pelvic lymph nodes. Reproductive: Status post hysterectomy. Other: No abdominal wall hernia or abnormality. No abdominopelvic ascites. Musculoskeletal: No acute or significant osseous findings. Review of the MIP images confirms the above findings. IMPRESSION: 1. 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 Thickness of hematoma measures up to 1.3 cm. 2. There is a small ulceration at the medial aspect of the proximal descending thoracic aorta, measuring no greater than 5 mm. 3. Intramural hematoma described above extends into the abdominal aorta to approximately the level of the right renal artery, without evident involvement of the branch vessel origins. The abdominal aorta is nonaneurysmal. 4. Aortic Atherosclerosis (ICD10-I70.0). 5. Coronary artery disease. These results were called by telephone at the time of interpretation on 08/28/2019 at 12:07 pm to PA [place] Nijah Orlich , who verbally acknowledged these results.  Electronically Signed   By: Eddie Candle M.D.   On: 08/28/2019 12:16    Procedures .Critical Care Performed by: Gladie Gravette, [place] N, PA-C Authorized by: Kyng Matlock, [place] N, PA-C   Critical care provider statement:    Critical care time (minutes):  70   Critical care time was exclusive of:  Separately billable procedures and treating other patients and teaching time   Critical care was necessary to treat or prevent imminent or life-threatening deterioration of the following conditions:  Cardiac failure (hypertensive emergency, aortic dissection)   Critical care was time spent personally by me on the following activities:  Discussions with consultants, evaluation of patient's response to treatment, examination of patient, ordering and performing treatments and interventions, ordering and review of laboratory studies, ordering and review of radiographic studies, pulse oximetry, re-evaluation of patient's condition, obtaining history from patient or surrogate and review of old charts   I assumed direction of critical care for this patient from another provider in my specialty: no     (including critical care time)  Medications Ordered in ED Medications  nicardipine (CARDENE) 20mg  in 0.86% saline 283ml IV infusion (0.1 mg/ml) (10 mg/hr Intravenous Rate/Dose Change 08/28/19 1323)  fentaNYL (SUBLIMAZE) injection 50 mcg (has no administration in time range)  ondansetron (ZOFRAN) injection 4 mg (4 mg Intravenous Given 08/28/19 1028)  nitroGLYCERIN (NITROSTAT) SL tablet 0.4 mg (0.4 mg Sublingual Given 08/28/19 1028)  LORazepam (ATIVAN) injection 1 mg (1 mg Intravenous Given 08/28/19 1052)  fentaNYL (SUBLIMAZE) injection 50 mcg (50 mcg Intravenous Given 08/28/19 1136)  iohexol (OMNIPAQUE) 350 MG/ML injection 80 mL (80 mLs Intravenous Contrast Given 08/28/19 1137)  labetalol (NORMODYNE) injection 20 mg (20 mg Intravenous Given 08/28/19 1207)  HYDROmorphone (DILAUDID) injection 1 mg (1 mg Intravenous Given 08/28/19  1211)  pantoprazole (PROTONIX) injection 40 mg (40 mg Intravenous Given 08/28/19 1216)  hydrALAZINE (APRESOLINE) injection 5 mg (5 mg Intravenous Given 08/28/19 1225)  labetalol (NORMODYNE) injection 40 mg (40 mg Intravenous Given 08/28/19 1238)  HYDROmorphone (DILAUDID) injection 1 mg (1 mg Intravenous Given 08/28/19 1327)    ED Course  I have reviewed the triage vital  signs and the nursing notes.  Pertinent labs & imaging results that were available during my care of the patient were reviewed by me and considered in my medical decision making (see chart for details).  Clinical Course as of Aug 27 1441  Sun Aug 28, 2019  1217 Per radiologist, intramural hematoma of the thoracic aorta, concern for penetrating ulcer   [JR]  1237 Consulted with Dr. Trula Slade with vascular surgery, after discussing with CT surgeon Dr Roxan Hockey. Dr. Trula Slade recommends Target SBP <130, and will need to be admitted to CC for BP management, usually not OR at this time, management BP and pain, stabilize then fix.    [JR]  1300 Pt without much improvement after IV BP medications, will start cardene drip   [JR]    Clinical Course User Index [JR] Kyran Whittier, [place] N, PA-C   MDM Rules/Calculators/A&P                          Patient is 69 year old female presenting via EMS with sudden onset of sharp central chest pain that radiates to her back, began about 2 hours prior to arrival.  She has associated nausea with vomiting and shortness of breath.  On exam, she appears very uncomfortable, she is actively vomiting nonbloody nonbilious emesis.  She does not report any neuro symptoms.  Her initial blood pressure is 993T systolic, however begins to elevate.  She is sent to the CT scanner with concern for aortic dissection.  Pain and nausea treated.  CT imaging reveals intramural hematoma of the descending thoracic aorta to the level of the right renal artery.  There is also small ulceration measuring about 5 mm in size to the  proximal descending thoracic aorta.  On reevaluation, patient continues to appear uncomfortable, blood pressure remains significantly elevated over 701 systolic.  She is treated with labetalol without much improvement, additional dose of labetalol and hydralazine administered.  Consulted with CT surgeon Dr. Roxan Hockey who recommended vascular surgery consult given location of hematoma.  Discussed with Dr. Trula Slade with vascular surgery, recommends admission to critical care and medical management at this time with SBP goal of less than 130 and pain management.  Blood work is unremarkable with normal troponins x2.  Covid test is negative.  EKGs are nonischemic at this time.  Patient's blood pressures with a little improvement after additional doses of labetalol and hydralazine.  Patient started on Cardene drip.  Additional pain medications administered as pain is also difficult to control.  Vomiting has resolved.  Discussed with critical care PA, patient accepted [facility] by Dr. Erskine Emery.  They are currently no ICU beds available at this time therefore patient will be transferred to [facility], ED while awaiting bed availability.  Dr. Laverta Baltimore is accepting transfer.  Final Clinical Impression(s) / ED Diagnoses Final diagnoses:  Chest pain, unspecified type  Dissection of thoracic aorta [facility])    Rx / [place] Orders ED Discharge Orders    None       Maedell Hedger, [place] N, PA-C 08/28/19 1450    Milton Ferguson, MD 08/30/19 1024

## 2019-08-28 NOTE — ED Notes (Signed)
Husband at bedside. Plan of care discussed with patient and family.

## 2019-08-28 NOTE — ED Triage Notes (Signed)
CP ~1.4hours ago from home, hx of arrhthymias w pacemaker, last BP with EMS 168/105, 324 ASA & 2 nitro's with little relief

## 2019-08-28 NOTE — ED Notes (Signed)
Pt arrived from [facility] via [facility]. Pt A & O, rating pain 8/10 to back and chest. Cardene infusing. Dr. Sedonia Small at bedside.

## 2019-08-28 NOTE — ED Notes (Signed)
EDP at bedside to discuss plan of care. Pt verbalized understanding.

## 2019-08-28 NOTE — ED Notes (Signed)
[facility] at bedside 

## 2019-08-28 NOTE — ED Provider Notes (Signed)
  Provider Note MRN:  [id number]  [place] date & time: 08/28/19    ED Course and Medical Decision Making  Assumed care from Dr. Roderic Palau and [place] upon patient transfer.  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 Trula Slade of vascular surgery following.  Patient arrives with continued pain in the chest, blood pressure 130/170, on nicardipine drip.  Critical care is made aware and will come to evaluate the patient, will consider a line placement to help monitor patient's blood pressures more accurately.  .Critical Care Performed by: Maudie Flakes, MD Authorized by: Maudie Flakes, MD   Critical care provider statement:    Critical care time (minutes):  36   Critical care was necessary to treat or prevent imminent or life-threatening deterioration of the following conditions: Aortic dissection.   Critical care was time spent personally by me on the following activities:  Discussions with consultants, evaluation of patient's response to treatment, examination of patient, ordering and performing treatments and interventions, ordering and review of laboratory studies, ordering and review of radiographic studies, pulse oximetry, re-evaluation of patient's condition, obtaining history from patient or surrogate and review of old charts   I assumed direction of critical care for this patient from another provider in my specialty: yes    ARTERIAL LINE  Date/Time: 08/28/2019 4:21 PM Performed by: Maudie Flakes, MD Authorized by: Maudie Flakes, MD   Consent:    Consent obtained:  Verbal and emergent situation   Consent given by:  Patient Indications:    Indications: hemodynamic monitoring   Pre-procedure details:    Skin preparation:  2% Chlorhexidine Anesthesia (see MAR for exact dosages):    Anesthesia method:  None Procedure details:    Location:  R radial   Needle gauge:  20 G   Placement technique:  Ultrasound guided and Seldinger    Number of attempts:  1   Transducer: waveform confirmed   Post-procedure details:    Post-procedure:  Biopatch applied, sterile dressing applied, secured with tape and wrist guard applied   CMS:  Normal   Patient tolerance of procedure:  Tolerated well, no immediate complications    Final Clinical Impressions(s) / ED Diagnoses     ICD-10-CM   1. Chest pain, unspecified type  R07.9   2. Dissection of thoracic aorta (HCC)  I71.01   3. Respiration abnormal  R06.9 DG Chest [facility] 1 View    DG Chest [place] 1 View    ED Discharge Orders    None      Discharge Instructions   None     Barth Kirks. Sedonia Small, [place] [email or web address]    Maudie Flakes, MD 08/28/19 [phone]

## 2019-08-28 NOTE — ED Notes (Signed)
Pt states her pain started at 0900 this morning, not last night at 2100, pt states "I got confused."

## 2019-08-28 NOTE — ED Notes (Signed)
Pt continues to complain of mid chest pain into her back. EDP made aware.

## 2019-08-28 NOTE — ED Notes (Signed)
Pt transported to CT ?

## 2019-08-28 NOTE — H&P (Signed)
NAME:  [name], MRN:  [id number], DOB:  08-27-1950, LOS: 0 ADMISSION DATE:  08/28/2019, CONSULTATION DATE: 08/28/2019 REFERRING MD: Emergency department physician, CHIEF COMPLAINT: Abdominal pain with a descending aortic aneurysm dissecting  Brief History   69 year old female admitted with abdominal pain found to have a aortic descending dissecting aneurysm.  History of present illness   08/28/2019 1530 hrs. 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  She was transferred from [facility] emergency department to [facility] ED vascular surgery has been notified of her arrival.  She presented to [facility] with chief complaints 2 days of abdominal pain.  CT revealed the descending aortic aneurysm and intramural hematoma.  She was urgently transferred to [facility] for surgical review.  She is been placed on the Cleviprex drip with goal to keep her systolic blood pressure less than 130.  We will continue her oral antihypertensive also.  She is a well-documented past medical history that is listed below.  She will be admitted to the intensive care unit for further evaluation and treatment with possible surgical intervention.  Past Medical History  Hypertension Atrial fibrillation Hypothyroidism Diabetes Depressive disorder  Significant Hospital Events   Transferred from [facility] to [facility] ED  Consults:  08/28/2019 vascular surgery  Procedures:    Significant Diagnostic Tests:    Micro Data:    Antimicrobials:    Interim history/subjective:  Transferred to [facility] for dissecting aneurysm  Objective   Blood pressure 134/68, pulse 71, temperature 98.9 F (37.2 C), temperature source Oral, resp. rate (!) 22, height 5' (1.524 m), weight 63 kg, SpO2 99 %.       No intake or output data in the 24 hours ending 08/28/19 1502 Filed Weights   08/28/19  1008  Weight: 63 kg    Examination: General: Somewhat lethargic female following multiple doses of fentanyl and Ativan. HENT: Multiple missing teeth Lungs: Decreased breath sounds in the bases Cardiovascular: Heart sounds are distant Abdomen: Abdomen is tender positive bowel sounds questionable bruit.  Old surgical scars noted below the umbilicus Extremities: Cool but palpable dorsalis pedis pulses Neuro: Somewhat lethargic but grossly intact   Resolved Hospital Problem list     Assessment & Plan:  New diagnosis of descending aortic aneurysm and hematoma 1.3 cm in the setting of severe hypertension. Admit to the intensive care unit Surgical consult with vascular Blood pressure control with Cleviprex goal is less than 130 Monitor for pulse changes lower extremities O2 as needed  Chronic hypertension See above Continue oral agents  History of atrial fibrillation No anticoagulation   Diabetes mellitus CBG (last 3)  No results for input(s): GLUCAP in the last 72 hours.  Sliding-scale insulin protocol  Chronic depression Antidepressants  Best practice:  Diet: npo Pain/Anxiety/Delirium protocol (if indicated): As needed VAP protocol (if indicated): Not indicated DVT prophylaxis: Compression sequential hose GI prophylaxis: PPI Glucose control: Sliding scale insulin protocol Mobility: Bedrest Code Status: Full Family Communication: Patient updated at bedside 08/28/2019 Disposition: Admit to the intensive care unit  Labs   CBC: Recent Labs  Lab 08/28/19 1011  WBC 8.1  HGB 11.6*  HCT 37.3  MCV 84.0  PLT 941    Basic Metabolic Panel: Recent Labs  Lab 08/28/19 1011  NA 140  K 3.7  CL 102  CO2 27  GLUCOSE 200*  BUN 16  CREATININE 0.90  CALCIUM 9.3  GFR: Estimated Creatinine Clearance: 48.9 mL/min (by C-G formula based on SCr of 0.9 mg/dL). Recent Labs  Lab 08/28/19 1011  WBC 8.1    Liver Function Tests: Recent Labs  Lab 08/28/19 1011  AST  21  ALT 19  ALKPHOS 78  BILITOT 0.5  PROT 7.3  ALBUMIN 3.7   Recent Labs  Lab 08/28/19 1011  LIPASE 32   No results for input(s): AMMONIA in the last 168 hours.  ABG    Component Value Date/Time   TCO2 29 02/19/2011 1714     Coagulation Profile: No results for input(s): INR, PROTIME in the last 168 hours.  Cardiac Enzymes: No results for input(s): CKTOTAL, CKMB, CKMBINDEX, TROPONINI in the last 168 hours.  HbA1C: Hgb A1c MFr Bld  Date/Time Value Ref Range Status  01/20/2012 12:34 PM 6.5 (H) <5.7 % Final    Comment:                                                                           According to the ADA Clinical Practice Recommendations for 2011, when HbA1c is used as a screening test:     >=6.5%   Diagnostic of Diabetes Mellitus            (if abnormal result is confirmed)   5.7-6.4%   Increased risk of developing Diabetes Mellitus   References:Diagnosis and Classification of Diabetes Mellitus,Diabetes WYOV,7858,85(OYDXA 1):S62-S69 and Standards of Medical Care in         Diabetes - 2011,Diabetes JOIN,8676,72 (Suppl 1):S11-S61.    08/09/2008 08:59 AM 7.0 (H) 4.6 - 6.5 % Final    Comment:    See lab report for associated comment(s)    CBG: No results for input(s): GLUCAP in the last 168 hours.  Review of Systems:   10 point review of system taken, please see HPI for positives and negatives.   Past Medical History  She,  has a past medical history of Anemia, Anxiety, Arthritis, Atrial fib/flutter, transient, COPD (chronic obstructive pulmonary disease) ([place]), Depression, Diabetes mellitus, Diverticulosis, DJD (degenerative joint disease), GERD (gastroesophageal reflux disease), Gout, History of pneumonia, Hypertension, Hypothyroidism, and Internal hemorrhoids.   Surgical History    Past Surgical History:  Procedure Laterality Date  . CATARACT EXTRACTION W/ INTRAOCULAR LENS IMPLANT Left   . HERNIA REPAIR    . PACEMAKER INSERTION    . TUBAL LIGATION     . VAGINAL HYSTERECTOMY       Social History   reports that she has been smoking cigarettes. She has a 15.00 pack-year smoking history. She has never used smokeless tobacco. She reports that she does not drink alcohol and does not use drugs.   Family History   Her family history includes Diabetes in her maternal aunt; Heart attack in her mother; Heart disease in her maternal uncle and mother; Hypertension in her mother; Prostate cancer in her father; Stomach cancer in her son. There is no history of Colon cancer, Esophageal cancer, or Rectal cancer.   Allergies Allergies  Allergen Reactions  . Lisinopril     angioedema  . Codeine Phosphate Other (See Comments)    REACTION: UNKNOWN  . Morphine And Related Other (See Comments)    Cannot take:Heart problems  .  Naproxen Nausea And Vomiting  . Nsaids Nausea Only  . Penicillins     UNKNOWN REACTION  . Propoxyphene N-Acetaminophen Other (See Comments)    Makes me gag  . Sulfamethoxazole-Trimethoprim Other (See Comments)    Gi upset  . Flexeril [Cyclobenzaprine] Anxiety and Other (See Comments)    Pt states medication gives her nightmares and insomnia.     Home Medications  Prior to Admission medications   Medication Sig Start Date End Date Taking? Authorizing Provider  aspirin 81 MG tablet Take 81 mg by mouth every morning.    Yes [provider]  carvedilol (COREG) 25 MG tablet Take 25 mg by mouth 2 (two) times daily with a meal.     Yes [provider]  cholecalciferol (VITAMIN D) 1000 UNITS tablet Take 1,000 Units by mouth every morning.    Yes [provider]  cloNIDine (CATAPRES) 0.2 MG tablet Take 0.2 mg by mouth 3 (three) times daily.     Yes [provider]  hydrALAZINE (APRESOLINE) 100 MG tablet Take 100 mg by mouth 3 (three) times daily. 07/28/19  Yes [provider]  hydrochlorothiazide (HYDRODIURIL) 12.5 MG tablet Take 1 tablet (12.5 mg total) by mouth daily. 05/25/16  Yes Noemi Chapel, MD  levothyroxine (SYNTHROID) 200 MCG tablet Take 200 mcg by mouth daily. 08/15/19  Yes [provider]  lovastatin (MEVACOR) 20 MG tablet Take 20 mg by mouth at bedtime.   Yes [provider]  metFORMIN (GLUCOPHAGE) 500 MG tablet Take 500 mg by mouth 2 (two) times daily.   Yes [provider]  Multiple Vitamin (MULTIVITAMIN) tablet Take 1 tablet by mouth every morning.    Yes [provider]  Oxycodone HCl 10 MG TABS Take 10 mg by mouth 4 (four) times daily as needed. 08/08/19  Yes [provider]  polyethylene glycol (MIRALAX / GLYCOLAX) packet Take 17 g by mouth 2 (two) times daily.    Yes [provider]  potassium chloride SA (KLOR-CON) 20 MEQ tablet Take 20 mEq by mouth daily. 08/15/19  Yes [provider]  sertraline (ZOLOFT) 100 MG tablet Take 50 mg by mouth at bedtime.    Yes [provider]     Critical care time: 36 min     Richardson Landry Odesser Tourangeau ACNP Acute Care Nurse Practitioner [place] Please consult Amion 08/28/2019, 3:02 PM

## 2019-08-28 NOTE — Consult Note (Signed)
Vascular and Vein Specialist of [place]  Patient name: Rukaya Kleinschmidt MRN: [id number] DOB: 1950/06/30 Sex: female   REQUESTING PROVIDER:    ICU   REASON FOR CONSULT:    [facility]  HISTORY OF PRESENT ILLNESS:   Ceclia Koker is a 69 y.o. female, who presented to the [facility] emergency department with chest and abdominal pain that began about 2 hours prior to her arrival.  She describes the pain as radiating to her back and in her epigastrium.  She was hypertensive in the emergency department.  A CT scan showed thoracic aortic intramural hematoma with ulceration.  She was transferred to [facility] for definitive care.  The patient has a diagnosis of A. fib but is only taking aspirin.  She is a current smoker and has COPD.  She is medically managed for hypertension.  She is a diabetic.  She is medically managed for hypertension.  She takes a statin for hypercholesterolemia.  PAST MEDICAL HISTORY    Past Medical History:  Diagnosis Date  . Anemia   . Anxiety   . Arthritis   . Atrial fib/flutter, transient   . COPD (chronic obstructive pulmonary disease) ([place])   . Depression   . Diabetes mellitus   . Diverticulosis   . DJD (degenerative joint disease)   . GERD (gastroesophageal reflux disease)   . Gout   . History of pneumonia   . Hypertension   . Hypothyroidism   . Internal hemorrhoids      FAMILY HISTORY   Family History  Problem Relation Age of Onset  . Prostate cancer Father   . Hypertension Mother   . Heart attack Mother   . Heart disease Mother   . Stomach cancer Son   . Diabetes Maternal Aunt   . Heart disease Maternal Uncle   . Colon cancer Neg Hx   . Esophageal cancer Neg Hx   . Rectal cancer Neg Hx     SOCIAL HISTORY:   Social History   Socioeconomic History  . Marital status: Married    Spouse name: Not on file  . Number of children: 2  . Years of education: Not on file  . Highest education level:  Not on file  Occupational History  . Occupation: Disabled    Employer: UNEMPLOYED  Tobacco Use  . Smoking status: Current Some Day Smoker    Packs/day: 0.50    Years: 30.00    Pack years: 15.00    Types: Cigarettes  . Smokeless tobacco: Never Used  Substance and Sexual Activity  . Alcohol use: No  . Drug use: No  . Sexual activity: Yes    Birth control/protection: Surgical  Other Topics Concern  . Not on file  Social History Narrative  . Not on file   Social Determinants of Health   Financial Resource Strain:   . Difficulty of Paying Living Expenses:   Food Insecurity:   . Worried About Charity fundraiser in the Last Year:   . Arboriculturist in the Last Year:   Transportation Needs:   . Film/video editor (Medical):   Marland Kitchen Lack of Transportation (Non-Medical):   Physical Activity:   . Days of Exercise per Week:   . Minutes of Exercise per Session:   Stress:   . Feeling of Stress :   Social Connections:   . Frequency of Communication with Friends and Family:   . Frequency of Social Gatherings with Friends and Family:   .  Attends Religious Services:   . Active Member of Clubs or Organizations:   . Attends Archivist Meetings:   Marland Kitchen Marital Status:   Intimate Partner Violence:   . Fear of Current or Ex-Partner:   . Emotionally Abused:   Marland Kitchen Physically Abused:   . Sexually Abused:     ALLERGIES:    Allergies  Allergen Reactions  . Lisinopril     angioedema  . Codeine Phosphate Other (See Comments)    REACTION: UNKNOWN  . Morphine And Related Other (See Comments)    Cannot take:Heart problems  . Naproxen Nausea And Vomiting  . Nsaids Nausea Only  . Penicillins     UNKNOWN REACTION  . Propoxyphene N-Acetaminophen Other (See Comments)    Makes me gag  . Sulfamethoxazole-Trimethoprim Other (See Comments)    Gi upset  . Flexeril [Cyclobenzaprine] Anxiety and Other (See Comments)    Pt states medication gives her nightmares and insomnia.     CURRENT MEDICATIONS:    Current Facility-Administered Medications  Medication Dose Route Frequency Provider Last Rate Last Admin  . clevidipine (CLEVIPREX) infusion 0.5 mg/mL  0-21 mg/hr Intravenous Continuous Candee Furbish, MD 26 mL/hr at 08/28/19 1820 13 mg/hr at 08/28/19 1820  . cloNIDine (CATAPRES) tablet 0.2 mg  0.2 mg Oral TID Candee Furbish, MD   0.2 mg at 08/28/19 1738  . docusate sodium (COLACE) capsule 100 mg  100 mg Oral BID PRN Candee Furbish, MD      . esmolol (BREVIBLOC) 2000 mg / 100 mL (20 mg/mL) infusion  25-300 mcg/kg/min Intravenous Titrated Candee Furbish, MD 37.9 mL/hr at 08/28/19 1922 200 mcg/kg/min at 08/28/19 1922  . fentaNYL (SUBLIMAZE) injection 25-100 mcg  25-100 mcg Intravenous Q2H PRN Minor, Grace Bushy, NP   100 mcg at 08/28/19 1909  . insulin aspart (novoLOG) injection 0-15 Units  0-15 Units Subcutaneous Q4H Candee Furbish, MD      . Derrill Memo ON 08/29/2019] levothyroxine (SYNTHROID) tablet 150 mcg  150 mcg Oral Q0600 Candee Furbish, MD      . pantoprazole (PROTONIX) injection 40 mg  40 mg Intravenous Q24H Minor, Grace Bushy, NP   40 mg at 08/28/19 1737  . polyethylene glycol (MIRALAX / GLYCOLAX) packet 17 g  17 g Oral Daily PRN Candee Furbish, MD        REVIEW OF SYSTEMS:   [X]  denotes positive finding, [ ]  denotes negative finding Cardiac  Comments:  Chest pain or chest pressure: x   Shortness of breath upon exertion: x   Short of breath when lying flat:    Irregular heart rhythm:        Vascular    Pain in calf, thigh, or hip brought on by ambulation:    Pain in feet at night that wakes you up from your sleep:     Blood clot in your veins:    Leg swelling:         Pulmonary    Oxygen at home:    Productive cough:     Wheezing:         Neurologic    Sudden weakness in arms or legs:     Sudden numbness in arms or legs:     Sudden onset of difficulty speaking or slurred speech:    Temporary loss of vision in one eye:     Problems with  dizziness:         Gastrointestinal    Blood in stool:  Vomited blood:         Genitourinary    Burning when urinating:     Blood in urine:        Psychiatric    Major depression:         Hematologic    Bleeding problems:    Problems with blood clotting too easily:        Skin    Rashes or ulcers:        Constitutional    Fever or chills:     PHYSICAL EXAM:   Vitals:   08/28/19 1815 08/28/19 1830 08/28/19 1845 08/28/19 1900  BP:      Pulse: 77 74 83 74  Resp: 19 (!) 24 19 19   Temp:      TempSrc:      SpO2: 90% 94% 96% 94%  Weight:      Height:        GENERAL: The patient is a well-nourished female, in no acute distress. The vital signs are documented above. CARDIAC: There is a regular rate and rhythm.  VASCULAR: Palpable dorsalis pedis pulses bilaterally. PULMONARY: Nonlabored respirations ABDOMEN: Soft and non-tender without distention MUSCULOSKELETAL: There are no major deformities or cyanosis. NEUROLOGIC: No focal weakness or paresthesias are detected. SKIN: There are no ulcers or rashes noted. PSYCHIATRIC: The patient has a normal affect.  STUDIES:   I have reviewed her CTA with the following findings: . 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 Thickness of hematoma measures up to 1.3 cm. 2. There is a small ulceration at the medial aspect of the proximal descending thoracic aorta, measuring no greater than 5 mm. 3. Intramural hematoma described above extends into the abdominal aorta to approximately the level of the right renal artery, without evident involvement of the branch vessel origins. The abdominal aorta is nonaneurysmal. 4. Aortic Atherosclerosis (ICD10-I70.0). 5. Coronary artery disease.  ASSESSMENT and PLAN    Acute thoracic aortic intramural hematoma with penetrating ulcer and subsequent aneurysmal dilatation.  Maximum aortic diameter is 3.7 cm.  The patient does not have any symptoms of malperfusion.  Therefore we will initially treat her medically with blood pressure control with a target systolic blood pressure less than 130.  She will also receive IV medication for pain control.  Ideally if we can get her blood pressure and pain under control, I would consider endovascular repair in approximately 2 weeks.  If she has persistent pain, repair would be considered at an earlier date.  I plan on repeating her CT scan in approximately 48 hours.  I will continue to follow the patient while she is in the hospital.  I appreciate critical care's assistance with management of this patient   Leia Alf, MD, FACS [facility] Specialists of [facility] [phone] Pager [phone]

## 2019-08-29 ENCOUNTER — Inpatient Hospital Stay (HOSPITAL_COMMUNITY): Payer: Medicare Other

## 2019-08-29 DIAGNOSIS — I350 Nonrheumatic aortic (valve) stenosis: Secondary | ICD-10-CM

## 2019-08-29 DIAGNOSIS — I7101 Dissection of thoracic aorta: Secondary | ICD-10-CM | POA: Diagnosis not present

## 2019-08-29 DIAGNOSIS — I361 Nonrheumatic tricuspid (valve) insufficiency: Secondary | ICD-10-CM

## 2019-08-29 LAB — GLUCOSE, CAPILLARY
Glucose-Capillary: 152 mg/dL — ABNORMAL HIGH (ref 70–99)
Glucose-Capillary: 130 mg/dL — ABNORMAL HIGH (ref 70–99)
Glucose-Capillary: 146 mg/dL — ABNORMAL HIGH (ref 70–99)
Glucose-Capillary: 170 mg/dL — ABNORMAL HIGH (ref 70–99)
Glucose-Capillary: 131 mg/dL — ABNORMAL HIGH (ref 70–99)
Glucose-Capillary: 151 mg/dL — ABNORMAL HIGH (ref 70–99)

## 2019-08-29 LAB — BASIC METABOLIC PANEL
Anion gap: 11 (ref 5–15)
BUN: 16 mg/dL (ref 8–23)
CO2: 26 mmol/L (ref 22–32)
Calcium: 9.1 mg/dL (ref 8.9–10.3)
Chloride: 101 mmol/L (ref 98–111)
Creatinine, Ser: 0.92 mg/dL (ref 0.44–1.00)
GFR calc Af Amer: >60 mL/min (ref >60)
GFR calc non Af Amer: >60 mL/min (ref >60)
Glucose, Bld: 127 mg/dL — ABNORMAL HIGH (ref 70–99)
Potassium: 3.1 mmol/L — ABNORMAL LOW (ref 3.5–5.1)
Sodium: 138 mmol/L (ref 135–145)

## 2019-08-29 LAB — ECHOCARDIOGRAM COMPLETE
AR max vel: 1.93 cm2
AV Area VTI: 1.97 cm2
AV Area mean vel: 1.98 cm2
AV Mean grad: 12.5 mmHg
AV Peak grad: 22.1 mmHg
Ao pk vel: 2.35 m/s
Area-P 1/2: 3.4 cm2
Height: 60 in
S' Lateral: 3 cm
Weight: 1844.81 oz

## 2019-08-29 LAB — TYPE AND SCREEN
ABO/RH(D): O NEG
Antibody Screen: NEGATIVE

## 2019-08-29 LAB — MAGNESIUM: Magnesium: 1.4 mg/dL — ABNORMAL LOW (ref 1.7–2.4)

## 2019-08-29 LAB — PHOSPHORUS: Phosphorus: 3.6 mg/dL (ref 2.5–4.6)

## 2019-08-29 MED ORDER — CLONIDINE HCL 0.2 MG PO TABS
0.2000 mg | ORAL_TABLET | Freq: Three times a day (TID) | ORAL | Status: DC
  Administered 2019-08-29 – 2019-09-10 (×36): 0.2 mg via ORAL
  Filled 2019-08-29 (×36): qty 1

## 2019-08-29 MED ORDER — LEVOTHYROXINE SODIUM 200 MCG PO TABS
200.0000 ug | ORAL_TABLET | Freq: Every day | ORAL | Status: DC
  Administered 2019-08-29 – 2019-09-05 (×8): 200 ug via ORAL
  Filled 2019-08-29: qty 8
  Filled 2019-08-29 (×2): qty 2
  Filled 2019-08-29 (×5): qty 8

## 2019-08-29 MED ORDER — POTASSIUM CHLORIDE 20 MEQ/15ML (10%) PO SOLN
40.0000 meq | Freq: Three times a day (TID) | ORAL | Status: AC
  Administered 2019-08-29 (×2): 40 meq via ORAL
  Filled 2019-08-29 (×2): qty 30

## 2019-08-29 MED ORDER — SERTRALINE HCL 50 MG PO TABS
50.0000 mg | ORAL_TABLET | Freq: Every day | ORAL | Status: DC
  Administered 2019-08-29 – 2019-09-18 (×21): 50 mg via ORAL
  Filled 2019-08-29 (×21): qty 1

## 2019-08-29 MED ORDER — CARVEDILOL 25 MG PO TABS
25.0000 mg | ORAL_TABLET | Freq: Two times a day (BID) | ORAL | Status: DC
  Administered 2019-08-29 – 2019-08-31 (×5): 25 mg via ORAL
  Filled 2019-08-29 (×5): qty 1

## 2019-08-29 MED ORDER — ALUM & MAG HYDROXIDE-SIMETH 200-200-20 MG/5ML PO SUSP
30.0000 mL | Freq: Four times a day (QID) | ORAL | Status: DC | PRN
  Administered 2019-08-29: 30 mL via ORAL
  Filled 2019-08-29 (×2): qty 30

## 2019-08-29 MED ORDER — OXYCODONE HCL 5 MG PO TABS
5.0000 mg | ORAL_TABLET | Freq: Once | ORAL | Status: AC | PRN
  Administered 2019-08-29: 5 mg via ORAL
  Filled 2019-08-29: qty 1

## 2019-08-29 MED ORDER — ACETAMINOPHEN 325 MG PO TABS
650.0000 mg | ORAL_TABLET | Freq: Four times a day (QID) | ORAL | Status: DC | PRN
  Administered 2019-09-02 – 2019-09-10 (×4): 650 mg via ORAL
  Filled 2019-08-29 (×4): qty 2

## 2019-08-29 MED ORDER — ONDANSETRON HCL 4 MG/2ML IJ SOLN
4.0000 mg | Freq: Four times a day (QID) | INTRAMUSCULAR | Status: DC | PRN
  Administered 2019-08-29: 4 mg via INTRAVENOUS
  Filled 2019-08-29: qty 2

## 2019-08-29 MED ORDER — HYDRALAZINE HCL 50 MG PO TABS
100.0000 mg | ORAL_TABLET | Freq: Three times a day (TID) | ORAL | Status: DC
  Administered 2019-08-29 – 2019-09-11 (×40): 100 mg via ORAL
  Filled 2019-08-29 (×41): qty 2

## 2019-08-29 MED ORDER — POLYETHYLENE GLYCOL 3350 17 G PO PACK
17.0000 g | PACK | Freq: Two times a day (BID) | ORAL | Status: DC
  Administered 2019-08-29 – 2019-09-17 (×27): 17 g via ORAL
  Filled 2019-08-29 (×34): qty 1

## 2019-08-29 MED ORDER — MAGNESIUM OXIDE 400 (241.3 MG) MG PO TABS
400.0000 mg | ORAL_TABLET | Freq: Three times a day (TID) | ORAL | Status: AC
  Administered 2019-08-29 (×3): 400 mg via ORAL
  Filled 2019-08-29 (×3): qty 1

## 2019-08-29 MED ORDER — HYDROMORPHONE HCL 1 MG/ML IJ SOLN
1.0000 mg | INTRAMUSCULAR | Status: DC | PRN
  Administered 2019-08-29 – 2019-09-09 (×32): 1 mg via INTRAVENOUS
  Filled 2019-08-29 (×35): qty 1

## 2019-08-29 MED ORDER — OXYCODONE HCL 5 MG PO TABS
10.0000 mg | ORAL_TABLET | Freq: Four times a day (QID) | ORAL | Status: DC | PRN
  Administered 2019-08-29 – 2019-09-19 (×34): 10 mg via ORAL
  Filled 2019-08-29 (×36): qty 2

## 2019-08-29 MED ORDER — PRAVASTATIN SODIUM 10 MG PO TABS
20.0000 mg | ORAL_TABLET | Freq: Every day | ORAL | Status: DC
  Administered 2019-08-29 – 2019-09-15 (×18): 20 mg via ORAL
  Filled 2019-08-29 (×18): qty 2

## 2019-08-29 MED ORDER — HYDRALAZINE HCL 50 MG PO TABS: 100.0000 mg | ORAL_TABLET | Freq: Three times a day (TID) | ORAL | Status: DC

## 2019-08-29 MED ORDER — INSULIN ASPART 100 UNIT/ML ~~LOC~~ SOLN
0.0000 [IU] | Freq: Every day | SUBCUTANEOUS | Status: DC
  Administered 2019-09-03: 2 [IU] via SUBCUTANEOUS

## 2019-08-29 MED ORDER — INSULIN ASPART 100 UNIT/ML ~~LOC~~ SOLN
0.0000 [IU] | Freq: Three times a day (TID) | SUBCUTANEOUS | Status: DC
  Administered 2019-08-29 – 2019-08-30 (×4): 2 [IU] via SUBCUTANEOUS
  Administered 2019-08-30 – 2019-09-01 (×5): 3 [IU] via SUBCUTANEOUS
  Administered 2019-09-01 – 2019-09-02 (×3): 2 [IU] via SUBCUTANEOUS
  Administered 2019-09-02: 3 [IU] via SUBCUTANEOUS
  Administered 2019-09-02 – 2019-09-04 (×3): 2 [IU] via SUBCUTANEOUS
  Administered 2019-09-04: 3 [IU] via SUBCUTANEOUS
  Administered 2019-09-04 – 2019-09-05 (×2): 2 [IU] via SUBCUTANEOUS
  Administered 2019-09-05: 5 [IU] via SUBCUTANEOUS
  Administered 2019-09-05 – 2019-09-08 (×6): 2 [IU] via SUBCUTANEOUS
  Administered 2019-09-08 (×2): 3 [IU] via SUBCUTANEOUS
  Administered 2019-09-09 – 2019-09-11 (×4): 2 [IU] via SUBCUTANEOUS
  Administered 2019-09-11 – 2019-09-12 (×2): 3 [IU] via SUBCUTANEOUS
  Administered 2019-09-12 – 2019-09-13 (×2): 2 [IU] via SUBCUTANEOUS
  Administered 2019-09-13 – 2019-09-17 (×4): 3 [IU] via SUBCUTANEOUS
  Administered 2019-09-18: 2 [IU] via SUBCUTANEOUS
  Administered 2019-09-18: 3 [IU] via SUBCUTANEOUS
  Administered 2019-09-19: 2 [IU] via SUBCUTANEOUS

## 2019-08-29 MED ORDER — VITAMIN D 25 MCG (1000 UNIT) PO TABS
1000.0000 [IU] | ORAL_TABLET | Freq: Every morning | ORAL | Status: DC
  Administered 2019-08-29 – 2019-09-19 (×21): 1000 [IU] via ORAL
  Filled 2019-08-29 (×22): qty 1

## 2019-08-29 MED ORDER — WHITE PETROLATUM EX OINT
TOPICAL_OINTMENT | CUTANEOUS | Status: AC
  Filled 2019-08-29: qty 28.35

## 2019-08-29 MED ORDER — NICOTINE 7 MG/24HR TD PT24
7.0000 mg | MEDICATED_PATCH | Freq: Every day | TRANSDERMAL | Status: DC
  Administered 2019-08-29 – 2019-09-19 (×20): 7 mg via TRANSDERMAL
  Filled 2019-08-29 (×22): qty 1

## 2019-08-29 NOTE — Progress Notes (Signed)
[place] Progress Note Patient Name: [name] DOB: 06-22-1950 MRN: [id number]   Date of Service  08/29/2019  HPI/Events of Note  Earlier Oxy IR did work for 4 hrs. Takes 10 at home.   eICU Interventions  Oxy IR 5 mg once re ordered.      Intervention Category Intermediate Interventions: Pain - evaluation and management  Elmer Sow 08/29/2019, 12:51 AM

## 2019-08-29 NOTE — Progress Notes (Signed)
MD Notified:  Dx dissecting AA w/ intramural hematoma on Cleviprex and Esmolol gtts @ high rates.  IV Fent doesn't do much for the pain, used to taking 10mg  oxy 4x/day.  Please consider adding PO with Fent/Dilaudid for breakthru.  Has ALine, may need CVC.  Vasc surg saw her yesterday & said if pain worsened/couldn't get it/BP under control to let them know.  Pt states pain worsening. Also noted K 3.1, Mg 1.4.  0746:  SW consulted - Caregiver needs/ethical consideration:  pt takes care of husband w/ dementia & handicapped who is still driving.  He stayed overnight for safety reasons as pt wasn't admitted to the unit until ~1600.  Yesterday, he got lost in the hospital twice.  Once after getting separated in the ED & volunteers had to bring him to [facility].  Second time, went to [employer] found him and returned him to the unit.

## 2019-08-29 NOTE — Progress Notes (Signed)
  Echocardiogram 2D Echocardiogram has been performed.  [name] 08/29/2019, 2:42 PM

## 2019-08-29 NOTE — Progress Notes (Signed)
NAME:  [name], MRN:  [id number], DOB:  1950/08/12, LOS: 1 ADMISSION DATE:  08/28/2019, CONSULTATION DATE: 08/28/2019 REFERRING MD: Emergency department physician, CHIEF COMPLAINT: Abdominal pain with a descending aortic aneurysm dissecting  Brief History   69 yo female smoker presented to [facility] with abdominal pain x 2 days.  She was found to have descending thoracic aortic aneurysm and transferred to [facility] for further management.  Past Medical History  Hypertension, Atrial fibrillation, Tachy-brady syndrome s/p Pacemaker, Hypothyroidism, Diabetes, Depressive disorder, Chronic opiate medication use  Significant Hospital Events   8/08 transfer from [place]:  08/28/2019 vascular surgery  Procedures:    Significant Diagnostic Tests:   CT angio chest/abd/pel 8/08 >> acute appearing intramural hematoma of distal aortic arch and descending thoracic aorta originating at or distal to Lt [place] artery measuring 3.7 x 3.5 cm and extends to level of Rt renal artery mild centrilobular emphysema, scarring at lung bases  Micro Data:    Antimicrobials:    Interim history/subjective:  C/o abdominal pain.  States she takes 10 to 20 mg oxycodone every 6 hours at home for sciatica.  Nausea better.  Denies chest pain, leg pain.  Objective   Blood pressure (!) 149/55, pulse 75, temperature 98.8 F (37.1 C), temperature source Oral, resp. rate 18, height 5' (1.524 m), weight 52.3 kg, SpO2 97 %.        Intake/Output Summary (Last 24 hours) at 08/29/2019 0740 Last data filed at 08/29/2019 0600 Gross per 24 hour  Intake 1202.49 ml  Output --  Net 1202.49 ml   Filed Weights   08/28/19 1008 08/29/19 0426  Weight: 63 kg 52.3 kg    Examination:  General - alert Eyes - pupils reactive ENT - no sinus tenderness, no stridor Cardiac - regular rate/rhythm, no murmur, dorsal pedal pulses palpable b/l Chest - equal breath sounds b/l, no wheezing or rales Abdomen - soft, non tender, + bowel  sounds Extremities - no cyanosis, clubbing, or edema Skin - no rashes Neuro - normal strength, moves extremities, follows commands Psych - normal mood and behavior   Resolved Hospital Problem list     Assessment & Plan:   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 Hx of HTN, HLD. - vascular surgery consulted >> would like to defer intervention until her BP is better controlled if possible - if pain symptoms progress, then repeat CT imaging in PM of 8/09 >> otherwise wait until 8/10 - clevidipine/esmolol for goal SBP < 120, HR < 70 - restart outpt coreg, catapres, hydralazine, pravachol in place of mevacor while in hospital - hold outpt HCTZ, ASA for now - f/u Echo - increase dose of prn oxycodone and add prn tylenol and dilaudid  Tobacco abuse with changes of emphysema on CT imaging. - discussed importance of smoking cessation - add nicotine patch  Hx of hypothyroidism. - continue synthroid  Chronic back pain, hx of depression. - adjusting pain medications - resume zoloft  Hypokalemia, hypomagnesemia. - replace electrolytes  DM type 2 poorly controlled with hyperglycemia. - SSI - hold outpt metformin   Best practice:  Diet: clear liquids DVT prophylaxis: Compression sequential hose GI prophylaxis: not indicated Mobility: Bedrest Code Status: Full Disposition: ICU  D/w Dr. Trula Slade  Labs    CMP Latest Ref Rng & Units 08/29/2019 08/28/2019 05/07/2017  Glucose 70 - 99 mg/dL 127(H) 200(H) 130(H)  BUN 8 - 23 mg/dL 16 16 12   Creatinine 0.44 - 1.00 mg/dL 0.92 0.90  0.66  Sodium 135 - 145 mmol/L 138 140 134(L)  Potassium 3.5 - 5.1 mmol/L 3.1(L) 3.7 3.3(L)  Chloride 98 - 111 mmol/L 101 102 97(L)  CO2 22 - 32 mmol/L 26 27 24   Calcium 8.9 - 10.3 mg/dL 9.1 9.3 9.0  Total Protein 6.5 - 8.1 g/dL - 7.3 8.2(H)  Total Bilirubin 0.3 - 1.2 mg/dL - 0.5 0.7  Alkaline Phos 38 - 126 U/L - 78 77  AST 15 - 41 U/L - 21 24  ALT 0 - 44 U/L - 19 16    CBC  Latest Ref Rng & Units 08/28/2019 05/07/2017 05/06/2017  WBC 4.0 - 10.5 K/uL 8.1 12.6(H) 11.3(H)  Hemoglobin 12.0 - 15.0 g/dL 11.6(L) 14.0 13.2  Hematocrit 36 - 46 % 37.3 43.7 41.4  Platelets 150 - 400 K/uL 291 285 306    CBG (last 3)  Recent Labs    08/28/19 2352 08/29/19 0357 08/29/19 0739  GLUCAP 132* 130* 146*     Critical care time: 34 minutes  Chesley Mires, MD [place] Pager - [phone] 08/29/2019, 8:17 AM

## 2019-08-29 NOTE — Progress Notes (Signed)
    Subjective  -   Complaining of worse left sided abdominal pain Chest pain has improved  Physical Exam:  Abdomen is soft Palpable dorsalis pedis pulses laterally No focal neurologic deficits   Assessment/Plan:    Intramural aortic hematoma/penetrating aortic ulcer: Blood pressure is improved today, however still elevated at 140-150.  She needs a target pressure less than 130.  Critical care is restarting her home meds.  We are also increasing her narcotics to help with pain control.  If the patient does not clinically improve I may consider repeating her CT scan this evening, if not I will repeat it tomorrow.  Wells Santos Sollenberger 08/29/2019 8:25 AM --  Vitals:   08/29/19 0600 08/29/19 0741  BP:    Pulse: 75   Resp: 18   Temp:  98.5 F (36.9 C)  SpO2: 97%     Intake/Output Summary (Last 24 hours) at 08/29/2019 0825 Last data filed at 08/29/2019 0600 Gross per 24 hour  Intake 1202.49 ml  Output --  Net 1202.49 ml     Laboratory CBC    Component Value Date/Time   WBC 8.1 08/28/2019 1011   HGB 11.6 (L) 08/28/2019 1011   HCT 37.3 08/28/2019 1011   PLT 291 08/28/2019 1011    BMET    Component Value Date/Time   NA 138 08/29/2019 0613   K 3.1 (L) 08/29/2019 0613   CL 101 08/29/2019 0613   CO2 26 08/29/2019 0613   GLUCOSE 127 (H) 08/29/2019 0613   BUN 16 08/29/2019 0613   CREATININE 0.92 08/29/2019 0613   CALCIUM 9.1 08/29/2019 0613   GFRNONAA >60 08/29/2019 0613   GFRAA >60 08/29/2019 0613    COAG Lab Results  Component Value Date   INR 0.92 04/04/2010   INR 1.0 08/26/2007   INR 1.0 RATIO 05/19/2007   No results found for: PTT  Antibiotics Anti-infectives (From admission, onward)   None       V. Leia Alf, M.D., [facility] [place] Office: [phone] Pager:  [phone]

## 2019-08-30 ENCOUNTER — Inpatient Hospital Stay (HOSPITAL_COMMUNITY): Payer: Medicare Other

## 2019-08-30 DIAGNOSIS — I7101 Dissection of thoracic aorta: Secondary | ICD-10-CM | POA: Diagnosis not present

## 2019-08-30 LAB — GLUCOSE, CAPILLARY
Glucose-Capillary: 142 mg/dL — ABNORMAL HIGH (ref 70–99)
Glucose-Capillary: 130 mg/dL — ABNORMAL HIGH (ref 70–99)
Glucose-Capillary: 126 mg/dL — ABNORMAL HIGH (ref 70–99)
Glucose-Capillary: 151 mg/dL — ABNORMAL HIGH (ref 70–99)

## 2019-08-30 LAB — CBC
HCT: 33.6 % — ABNORMAL LOW (ref 36.0–46.0)
Hemoglobin: 10.3 g/dL — ABNORMAL LOW (ref 12.0–15.0)
MCH: 25.3 pg — ABNORMAL LOW (ref 26.0–34.0)
MCHC: 30.7 g/dL (ref 30.0–36.0)
MCV: 82.6 fL (ref 80.0–100.0)
Platelets: 244 10*3/uL (ref 150–400)
RBC: 4.07 MIL/uL (ref 3.87–5.11)
RDW: 21.3 % — ABNORMAL HIGH (ref 11.5–15.5)
WBC: 11.9 10*3/uL — ABNORMAL HIGH (ref 4.0–10.5)
nRBC: 0 % (ref 0.0–0.2)

## 2019-08-30 LAB — BASIC METABOLIC PANEL
Anion gap: 9 (ref 5–15)
BUN: 20 mg/dL (ref 8–23)
CO2: 25 mmol/L (ref 22–32)
Calcium: 9.2 mg/dL (ref 8.9–10.3)
Chloride: 100 mmol/L (ref 98–111)
Creatinine, Ser: 0.94 mg/dL (ref 0.44–1.00)
GFR calc Af Amer: >60 mL/min (ref >60)
GFR calc non Af Amer: >60 mL/min (ref >60)
Glucose, Bld: 145 mg/dL — ABNORMAL HIGH (ref 70–99)
Potassium: 4.2 mmol/L (ref 3.5–5.1)
Sodium: 134 mmol/L — ABNORMAL LOW (ref 135–145)

## 2019-08-30 LAB — TRIGLYCERIDES: Triglycerides: 133 mg/dL (ref <150)

## 2019-08-30 LAB — MAGNESIUM: Magnesium: 2.1 mg/dL (ref 1.7–2.4)

## 2019-08-30 MED ORDER — ONDANSETRON 4 MG PO TBDP
4.0000 mg | ORAL_TABLET | Freq: Four times a day (QID) | ORAL | Status: DC | PRN
  Administered 2019-09-01: 4 mg via ORAL
  Filled 2019-08-30 (×2): qty 1

## 2019-08-30 MED ORDER — ALUM & MAG HYDROXIDE-SIMETH 200-200-20 MG/5ML PO SUSP
30.0000 mL | ORAL | Status: DC | PRN
  Administered 2019-08-30 – 2019-09-14 (×6): 30 mL via ORAL
  Filled 2019-08-30 (×8): qty 30

## 2019-08-30 MED ORDER — SODIUM CHLORIDE 0.9 % IV SOLN
INTRAVENOUS | Status: DC | PRN
  Administered 2019-08-30: 1000 mL via INTRAVENOUS

## 2019-08-30 MED ORDER — FAMOTIDINE 20 MG PO TABS
20.0000 mg | ORAL_TABLET | Freq: Every day | ORAL | Status: DC
  Administered 2019-08-31 – 2019-09-19 (×19): 20 mg via ORAL
  Filled 2019-08-30 (×19): qty 1

## 2019-08-30 MED ORDER — HYDROCHLOROTHIAZIDE 12.5 MG PO CAPS
12.5000 mg | ORAL_CAPSULE | Freq: Every day | ORAL | Status: DC
  Administered 2019-08-30 – 2019-09-01 (×3): 12.5 mg via ORAL
  Filled 2019-08-30 (×4): qty 1

## 2019-08-30 MED ORDER — FAMOTIDINE IN NACL 20-0.9 MG/50ML-% IV SOLN
20.0000 mg | Freq: Two times a day (BID) | INTRAVENOUS | Status: DC
  Administered 2019-08-30 (×2): 20 mg via INTRAVENOUS
  Filled 2019-08-30 (×2): qty 50

## 2019-08-30 MED ORDER — IOHEXOL 350 MG/ML SOLN
100.0000 mL | Freq: Once | INTRAVENOUS | Status: AC | PRN
  Administered 2019-08-30: 100 mL via INTRAVENOUS

## 2019-08-30 NOTE — Progress Notes (Signed)
I have reviewed her CT scan from this afternoon.  There is a stable appearance of her intramural hematoma and penetrating ulcer.  I recommend continuing with her current plan of pain control and blood pressure control.  I will follow up with her in the morning.  I updated her son Clifton James this evening  [name]

## 2019-08-30 NOTE — Progress Notes (Signed)
[facility] Progress Note Patient Name: [name] DOB: 1950/05/09 MRN: [id number]   Date of Service  08/30/2019  HPI/Events of Note  Patient c/o indigestion.  eICU Interventions  Plan: 1. Increase Maalox to Q 4 hours PRN. 2. Pepcid 20 mg IV now and Q 12 hours.      Intervention Category Major Interventions: Other:  Martinez Boxx Cornelia Copa 08/30/2019, 12:08 AM

## 2019-08-30 NOTE — Progress Notes (Signed)
CSW received consult for patient for possible safety concerns for the patient's husband who has dementia and is at home alone. CSW met with patient at bedside to discuss the safety concerns - patient confirmed that her patient was driving yesterday but states her neighbors came to get the car and the keys yesterday from the [facility] ED parking lot. Patient reports her husband is safe at home and that the neighbors got him groceries so that he doesn't need to go out of the home for food. Patient reports she has been in communication with the neighbors who are keep a close eye on the husband. Patient reports there are no safety concerns at this time. CSW encouraged patient to reach out if concerns or questions arise, she was agreeable.  Madilyn Fireman, MSW, LCSW-A Transitions of Care  Clinical Social Worker  [facility] Emergency Departments  Medical ICU [phone]

## 2019-08-30 NOTE — Progress Notes (Signed)
    Subjective  -   Overall, pain is improved, still with abdominal pain   Physical Exam:  Palpable DP pulses bilaterally abd soft Neuro intact       Assessment/Plan:    Aortic IMH/PAU:  Blood pressure is better controlled today, as is her pain.  I will plan on repeating her CTA today.  Continue with BP and pain control.  Surgical timing will be based on her symptoms and appearance of repeat CTA  Wells Tristyn Pharris 08/30/2019 9:31 AM --  Vitals:   08/30/19 0700 08/30/19 0800  BP: 116/64 103/67  Pulse: 72 70  Resp: 15 15  Temp: 98.6 F (37 C)   SpO2: 95% 96%    Intake/Output Summary (Last 24 hours) at 08/30/2019 0931 Last data filed at 08/30/2019 0800 Gross per 24 hour  Intake 2414.78 ml  Output --  Net 2414.78 ml     Laboratory CBC    Component Value Date/Time   WBC 11.9 (H) 08/30/2019 0410   HGB 10.3 (L) 08/30/2019 0410   HCT 33.6 (L) 08/30/2019 0410   PLT 244 08/30/2019 0410    BMET    Component Value Date/Time   NA 134 (L) 08/30/2019 0410   K 4.2 08/30/2019 0410   CL 100 08/30/2019 0410   CO2 25 08/30/2019 0410   GLUCOSE 145 (H) 08/30/2019 0410   BUN 20 08/30/2019 0410   CREATININE 0.94 08/30/2019 0410   CALCIUM 9.2 08/30/2019 0410   GFRNONAA >60 08/30/2019 0410   GFRAA >60 08/30/2019 0410    COAG Lab Results  Component Value Date   INR 0.92 04/04/2010   INR 1.0 08/26/2007   INR 1.0 RATIO 05/19/2007   No results found for: PTT  Antibiotics Anti-infectives (From admission, onward)   None       V. Leia Alf, M.D., [facility] [place] Office: [phone] Pager:  [phone]

## 2019-08-30 NOTE — Progress Notes (Signed)
NAME:  [name], MRN:  [id number], DOB:  [birth year], LOS: 2 ADMISSION DATE:  08/28/2019, CONSULTATION DATE:  08/28/2019 REFERRING MD:  ED Provider, CHIEF COMPLAINT:  Abdominal pain 2/2 descending aortic aneurysm    Brief History   69 year old female with PMHx of hypertension, tobacco use disorder, atrial fibrillation, hypothyroidism presented to [facility] with abdominal pain for 2 days. She was found to have a descending thoracic aortic aneurysm and was transferred to [facility] for further management.   Past Medical History   Past Medical History:  Diagnosis Date  . Anemia   . Anxiety   . Arthritis   . Atrial fib/flutter, transient   . COPD (chronic obstructive pulmonary disease) ([place])   . Depression   . Diabetes mellitus   . Diverticulosis   . DJD (degenerative joint disease)   . GERD (gastroesophageal reflux disease)   . Gout   . History of pneumonia   . Hypertension   . Hypothyroidism   . Internal hemorrhoids    Significant Hospital Events   8/8 transferred from [facility]  Consults:  Vascular surgery  Procedures:  None  Significant Diagnostic Tests:   CT angio chest/abd/pel 8/08 >> acute appearing intramural hematoma of distal aortic arch and descending thoracic aorta originating at or distal to Lt [place] artery measuring 3.7 x 3.5 cm and extends to level of Rt renal artery mild centrilobular emphysema, scarring at lung bases  Micro Data:  COVID 8/8 > negative MRSA 8/8 > negative  Antimicrobials:  None   Interim history/subjective:  Patient with improved blood pressure control this morning with SBP 120s. Overnight, complaining of indigestion. No acute concerns this morning.   Objective   Blood pressure 116/64, pulse 72, temperature 98.6 F (37 C), temperature source Oral, resp. rate 15, height 5' (1.524 m), weight 57.8 kg, SpO2 95 %.        Intake/Output Summary (Last 24 hours) at 08/30/2019 7829 Last data filed at 08/30/2019 0700 Gross per 24 hour  Intake 2271.88 ml   Output --  Net 2271.88 ml   Filed Weights   08/28/19 1008 08/29/19 0426 08/30/19 0407  Weight: 63 kg 52.3 kg 57.8 kg    Examination: General: thin appearing female, no acute distress HENT: [place]/AT, EOMI nl, PERRL Lungs: Clear to auscultation bilaterally Cardiovascular: RRR, S1 and S2 present, No m/r/g Abdomen: soft, nontender, nondistended, normoactive bowel sounds  Extremities: warm and dry, distal pulses intact Neuro: awake, alert oriented, no focal deficits noted   Assessment & Plan:  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 Patient has history of tobacco use and hypertension - Vascular surgery consulted - Repeat CTA today to assess for progression and timing of intervention - BP control improved today with clevedipine, esmolol and resumption of outpatient coreg, clonidine and hydralazine - Pending CTA results, will consider resumption of her HCTZ to wean clevidipine and esmolol gtt as tolerated to maintain SBP <130  Hx of tobacco use disorder - Continue to encourage smoking cessation  Hypothyroidism: - Continue synthroid   Chronic back pain - Oxycodone 10mg  q4h prn - Continue zoloft  Hyperglycemia 2/2 poorly controlled type II diabetes mellitus: - Continue SSI for goal CBGs <180  Best practice:  Diet: carb modified  Pain/Anxiety/Delirium protocol (if indicated): oxycodone VAP protocol (if indicated): none DVT prophylaxis: SCDs GI prophylaxis: pepcid  Glucose control: SSI Mobility: Bed rest  Code Status: FULL  Family Communication: patient and husband updated via phone at bedside  Disposition: ICU  Critical care time: 35 minutes     Harvie Heck, MD Internal Medicine, PGY-2 08/30/19 1:45 PM Pager # [phone]

## 2019-08-31 DIAGNOSIS — I7101 Dissection of thoracic aorta: Secondary | ICD-10-CM | POA: Diagnosis not present

## 2019-08-31 LAB — GLUCOSE, CAPILLARY
Glucose-Capillary: 126 mg/dL — ABNORMAL HIGH (ref 70–99)
Glucose-Capillary: 127 mg/dL — ABNORMAL HIGH (ref 70–99)
Glucose-Capillary: 139 mg/dL — ABNORMAL HIGH (ref 70–99)
Glucose-Capillary: 166 mg/dL — ABNORMAL HIGH (ref 70–99)
Glucose-Capillary: 176 mg/dL — ABNORMAL HIGH (ref 70–99)
Glucose-Capillary: 183 mg/dL — ABNORMAL HIGH (ref 70–99)
Glucose-Capillary: 199 mg/dL — ABNORMAL HIGH (ref 70–99)

## 2019-08-31 LAB — CBC
HCT: 31.4 % — ABNORMAL LOW (ref 36.0–46.0)
Hemoglobin: 9.9 g/dL — ABNORMAL LOW (ref 12.0–15.0)
MCH: 25.8 pg — ABNORMAL LOW (ref 26.0–34.0)
MCHC: 31.5 g/dL (ref 30.0–36.0)
MCV: 82 fL (ref 80.0–100.0)
Platelets: 223 10*3/uL (ref 150–400)
RBC: 3.83 MIL/uL — ABNORMAL LOW (ref 3.87–5.11)
RDW: 21.4 % — ABNORMAL HIGH (ref 11.5–15.5)
WBC: 11.4 10*3/uL — ABNORMAL HIGH (ref 4.0–10.5)
nRBC: 0 % (ref 0.0–0.2)

## 2019-08-31 LAB — BASIC METABOLIC PANEL
Anion gap: 7 (ref 5–15)
BUN: 22 mg/dL (ref 8–23)
CO2: 25 mmol/L (ref 22–32)
Calcium: 9 mg/dL (ref 8.9–10.3)
Chloride: 100 mmol/L (ref 98–111)
Creatinine, Ser: 1.12 mg/dL — ABNORMAL HIGH (ref 0.44–1.00)
GFR calc Af Amer: 58 mL/min — ABNORMAL LOW (ref >60)
GFR calc non Af Amer: 50 mL/min — ABNORMAL LOW (ref >60)
Glucose, Bld: 143 mg/dL — ABNORMAL HIGH (ref 70–99)
Potassium: 4.1 mmol/L (ref 3.5–5.1)
Sodium: 132 mmol/L — ABNORMAL LOW (ref 135–145)

## 2019-08-31 MED ORDER — CARVEDILOL 25 MG PO TABS
50.0000 mg | ORAL_TABLET | Freq: Two times a day (BID) | ORAL | Status: DC
  Administered 2019-08-31 – 2019-09-01 (×2): 50 mg via ORAL
  Filled 2019-08-31 (×2): qty 2

## 2019-08-31 MED ORDER — LOSARTAN POTASSIUM 25 MG PO TABS
25.0000 mg | ORAL_TABLET | Freq: Every day | ORAL | Status: DC
  Administered 2019-08-31 – 2019-09-01 (×2): 25 mg via ORAL
  Filled 2019-08-31 (×2): qty 1

## 2019-08-31 NOTE — Progress Notes (Signed)
    Subjective  -   Pain is better controlled today.  She is complaining of some indigestion.   Physical Exam:  Abdomen is soft and nontender. Palpable dorsalis pedis pulses bilaterally. Neurologically intact       Assessment/Plan:    Aortic intramural hematoma with penetrating ulcer: Her pain is improved with blood pressure control.  CT scan from yesterday shows stable appearance of her acute aortic syndrome.  She has no signs of malperfusion.  I discussed with the patient and her husband at the bedside that we will continue medical management and plan for surgical repair, likely next week.  Wells Phoenicia Pirie 08/31/2019 8:17 PM --  Vitals:   08/31/19 1600 08/31/19 2002  BP: 121/72   Pulse: 75   Resp: (!) 21   Temp:  (!) 101.1 F (38.4 C)  SpO2: 90%     Intake/Output Summary (Last 24 hours) at 08/31/2019 2017 Last data filed at 08/31/2019 1800 Gross per 24 hour  Intake 985.92 ml  Output 450 ml  Net 535.92 ml     Laboratory CBC    Component Value Date/Time   WBC 11.4 (H) 08/31/2019 0301   HGB 9.9 (L) 08/31/2019 0301   HCT 31.4 (L) 08/31/2019 0301   PLT 223 08/31/2019 0301    BMET    Component Value Date/Time   NA 132 (L) 08/31/2019 0301   K 4.1 08/31/2019 0301   CL 100 08/31/2019 0301   CO2 25 08/31/2019 0301   GLUCOSE 143 (H) 08/31/2019 0301   BUN 22 08/31/2019 0301   CREATININE 1.12 (H) 08/31/2019 0301   CALCIUM 9.0 08/31/2019 0301   GFRNONAA 50 (L) 08/31/2019 0301   GFRAA 58 (L) 08/31/2019 0301    COAG Lab Results  Component Value Date   INR 0.92 04/04/2010   INR 1.0 08/26/2007   INR 1.0 RATIO 05/19/2007   No results found for: PTT  Antibiotics Anti-infectives (From admission, onward)   None       V. Leia Alf, M.D., [facility] [place] Office: [phone] Pager:  [phone]

## 2019-08-31 NOTE — Progress Notes (Signed)
NAME:  [name], MRN:  [id number], DOB:  Jul 03, 1950, LOS: 3 ADMISSION DATE:  08/28/2019, CONSULTATION DATE:  08/28/2019 REFERRING MD:  ED Provider, CHIEF COMPLAINT:  Abdominal pain 2/2 descending aortic aneurysm    Brief History   69 year old female with PMHx of hypertension, tobacco use disorder, atrial fibrillation, hypothyroidism presented to [facility] with abdominal pain for 2 days. She was found to have a descending thoracic aortic aneurysm and was transferred to [facility] for further management.   Past Medical History   Past Medical History:  Diagnosis Date  . Anemia   . Anxiety   . Arthritis   . Atrial fib/flutter, transient   . COPD (chronic obstructive pulmonary disease) ([place])   . Depression   . Diabetes mellitus   . Diverticulosis   . DJD (degenerative joint disease)   . GERD (gastroesophageal reflux disease)   . Gout   . History of pneumonia   . Hypertension   . Hypothyroidism   . Internal hemorrhoids    Significant Hospital Events   8/8 transferred from [facility]  Consults:  Vascular surgery  Procedures:  None  Significant Diagnostic Tests:   CT angio chest/abd/pel 8/08 >> acute appearing intramural hematoma of distal aortic arch and descending thoracic aorta originating at or distal to Lt [place] artery measuring 3.7 x 3.5 cm and extends to level of Rt renal artery mild centrilobular emphysema, scarring at lung bases  CT angio chest/abd/pel 8/11 >> stable appearance of acute intramural hematoma arising distal to the takeoff of the left subclavian artery and terminating at the level of the proximal abdominal aorta; previously demonstrated possible penetrating ulcer arising from the descending thoracic aorta no longer well appreciated and likely resolved in the interim; bilateral pleural effusions with mild interlobular septal thickening suggestive of volume overload  Micro Data:  COVID 8/8 > negative MRSA 8/8 > negative  Antimicrobials:  None   Interim  history/subjective:  Patient with improved blood pressure control. No acute overnight events. Denies any chest pain or shortness of breath.   Objective   Blood pressure 97/65, pulse 67, temperature 98.5 F (36.9 C), temperature source Oral, resp. rate 16, height 5' (1.524 m), weight 58 kg, SpO2 94 %.        Intake/Output Summary (Last 24 hours) at 08/31/2019 0900 Last data filed at 08/31/2019 0600 Gross per 24 hour  Intake 1453.58 ml  Output 675 ml  Net 778.58 ml   Filed Weights   08/29/19 0426 08/30/19 0407 08/31/19 0404  Weight: 52.3 kg 57.8 kg 58 kg    Examination: General: thin appearing female, no acute distress HENT: [place]/AT, EOMI nl, PERRL Lungs: Clear to auscultation bilaterally Cardiovascular: RRR, S1 and S2 present, No m/r/g Abdomen: soft, nontender, nondistended, normoactive bowel sounds  Extremities: warm and dry, distal pulses intact Neuro: awake, alert oriented, no focal deficits noted   Assessment & Plan:  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 CTA chest/abd/pelvis with stable intramural hematoma from left subclavian to proximal abdomimal aorta and prior penetrating ulcer from descending thoracic aorta appears resolved.  - Vascular surgery consulted - Patient on clevedipine and esmolol gtt as well as coreg, clonidine, HCTZ and hydralazine. Hx of angioedema with lisinopril.  - Will increase coreg at this time and addition of losartan to wean off of clevedipine and esmolol gtt as able.   Hx of angioedema with ACEi: - Will start losartan 25mg  today.  - Monitor  for any angioedema  Hx of tobacco use disorder - Continue to encourage smoking cessation  Hypothyroidism: - Continue synthroid   Chronic back pain - Oxycodone 10mg  q4h prn - Continue zoloft  Hyperglycemia 2/2 poorly controlled type II diabetes mellitus: - Continue SSI for goal CBGs <180  Best practice:  Diet: carb  modified  Pain/Anxiety/Delirium protocol (if indicated): oxycodone VAP protocol (if indicated): none DVT prophylaxis: SCDs GI prophylaxis: pepcid  Glucose control: SSI Mobility: Bed rest  Code Status: FULL  Family Communication: patient and husband updated via phone at bedside  Disposition: ICU  Critical care time: 35 minutes     Harvie Heck, MD Internal Medicine, PGY-2 08/31/19 9:00 AM Pager # [phone]

## 2019-09-01 DIAGNOSIS — I7101 Dissection of thoracic aorta: Secondary | ICD-10-CM | POA: Diagnosis not present

## 2019-09-01 LAB — BASIC METABOLIC PANEL
Anion gap: 8 (ref 5–15)
BUN: 25 mg/dL — ABNORMAL HIGH (ref 8–23)
CO2: 24 mmol/L (ref 22–32)
Calcium: 8.9 mg/dL (ref 8.9–10.3)
Chloride: 100 mmol/L (ref 98–111)
Creatinine, Ser: 1.25 mg/dL — ABNORMAL HIGH (ref 0.44–1.00)
GFR calc Af Amer: 51 mL/min — ABNORMAL LOW (ref >60)
GFR calc non Af Amer: 44 mL/min — ABNORMAL LOW (ref >60)
Glucose, Bld: 153 mg/dL — ABNORMAL HIGH (ref 70–99)
Potassium: 4.2 mmol/L (ref 3.5–5.1)
Sodium: 132 mmol/L — ABNORMAL LOW (ref 135–145)

## 2019-09-01 LAB — CBC
HCT: 29.4 % — ABNORMAL LOW (ref 36.0–46.0)
Hemoglobin: 9.3 g/dL — ABNORMAL LOW (ref 12.0–15.0)
MCH: 25.5 pg — ABNORMAL LOW (ref 26.0–34.0)
MCHC: 31.6 g/dL (ref 30.0–36.0)
MCV: 80.5 fL (ref 80.0–100.0)
Platelets: 227 10*3/uL (ref 150–400)
RBC: 3.65 MIL/uL — ABNORMAL LOW (ref 3.87–5.11)
RDW: 21.2 % — ABNORMAL HIGH (ref 11.5–15.5)
WBC: 11.2 10*3/uL — ABNORMAL HIGH (ref 4.0–10.5)
nRBC: 0 % (ref 0.0–0.2)

## 2019-09-01 LAB — GLUCOSE, CAPILLARY
Glucose-Capillary: 122 mg/dL — ABNORMAL HIGH (ref 70–99)
Glucose-Capillary: 135 mg/dL — ABNORMAL HIGH (ref 70–99)
Glucose-Capillary: 167 mg/dL — ABNORMAL HIGH (ref 70–99)
Glucose-Capillary: 171 mg/dL — ABNORMAL HIGH (ref 70–99)

## 2019-09-01 MED ORDER — DOCUSATE SODIUM 100 MG PO CAPS
100.0000 mg | ORAL_CAPSULE | Freq: Two times a day (BID) | ORAL | Status: AC
  Administered 2019-09-01 (×2): 100 mg via ORAL
  Filled 2019-09-01 (×2): qty 1

## 2019-09-01 MED ORDER — LABETALOL HCL 300 MG PO TABS
300.0000 mg | ORAL_TABLET | Freq: Three times a day (TID) | ORAL | Status: DC
  Administered 2019-09-01 – 2019-09-05 (×14): 300 mg via ORAL
  Filled 2019-09-01 (×15): qty 1

## 2019-09-01 NOTE — Progress Notes (Addendum)
NAME:  [name], MRN:  [id number], DOB:  14-Apr-1950, LOS: 4 ADMISSION DATE:  08/28/2019, CONSULTATION DATE:  08/28/2019 REFERRING MD:  ED Provider, CHIEF COMPLAINT:  Abdominal pain 2/2 descending aortic aneurysm    Brief History   69 year old female with PMHx of hypertension, tobacco use disorder, atrial fibrillation, hypothyroidism presented to [facility] with abdominal pain for 2 days. She was found to have a descending thoracic aortic aneurysm and was transferred to [facility] for further management.   Past Medical History   Past Medical History:  Diagnosis Date  . Anemia   . Anxiety   . Arthritis   . Atrial fib/flutter, transient   . COPD (chronic obstructive pulmonary disease) ([place])   . Depression   . Diabetes mellitus   . Diverticulosis   . DJD (degenerative joint disease)   . GERD (gastroesophageal reflux disease)   . Gout   . History of pneumonia   . Hypertension   . Hypothyroidism   . Internal hemorrhoids    Significant Hospital Events   8/8 transferred from [facility]  Consults:  Vascular surgery  Procedures:  None  Significant Diagnostic Tests:   CT angio chest/abd/pel 8/08 >> acute appearing intramural hematoma of distal aortic arch and descending thoracic aorta originating at or distal to Lt [place] artery measuring 3.7 x 3.5 cm and extends to level of Rt renal artery mild centrilobular emphysema, scarring at lung bases  CT angio chest/abd/pel 8/11 >> stable appearance of acute intramural hematoma arising distal to the takeoff of the left subclavian artery and terminating at the level of the proximal abdominal aorta; previously demonstrated possible penetrating ulcer arising from the descending thoracic aorta no longer well appreciated and likely resolved in the interim; bilateral pleural effusions with mild interlobular septal thickening suggestive of volume overload  Micro Data:  COVID 8/8 > negative MRSA 8/8 > negative  Antimicrobials:  None   Interim  history/subjective:  Overnight, no acute events. Patient endorses improved overall pain control but still has some indigestion.   Objective   Blood pressure 109/66, pulse 68, temperature 99 F (37.2 C), temperature source Oral, resp. rate 19, height 5' (1.524 m), weight 58 kg, SpO2 91 %.        Intake/Output Summary (Last 24 hours) at 09/01/2019 1107 Last data filed at 09/01/2019 0800 Gross per 24 hour  Intake 782.23 ml  Output 400 ml  Net 382.23 ml   Filed Weights   08/29/19 0426 08/30/19 0407 08/31/19 0404  Weight: 52.3 kg 57.8 kg 58 kg    Examination: General: thin appearing female, no acute distress HENT: [place]/AT, EOMI nl, PERRL Lungs: Clear to auscultation bilaterally Cardiovascular: RRR, S1 and S2 present, No m/r/g Abdomen: soft, nontender, nondistended, normoactive bowel sounds  Extremities: warm and dry, distal pulses intact Neuro: awake, alert oriented, no focal deficits noted   Assessment & Plan:  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 CTA chest/abd/pelvis with stable intramural hematoma from left subclavian to proximal abdomimal aorta and prior penetrating ulcer from descending thoracic aorta appears resolved.  - Vascular surgery consulted - Patient on clevedipine and esmolol gtt as well as coreg, clonidine, HCTZ, losartan and hydralazine. Hx of angioedema with lisinopril.  - At this time, will switch coreg to labetalol 300mg  q8h to wean off clevedipine and esmolol gtt as able.  - Plans for surgery next week per Dr Trula Slade   Hx of angioedema with ACEi: - Noted  to have angioedema with current dose of losartan as well - Will discontinue losartan and monitor for any airway compromise   Hx of tobacco use disorder - Continue to encourage smoking cessation  Hypothyroidism: - Continue synthroid   Chronic back pain - Oxycodone 10mg  q4h prn - Continue zoloft  Hyperglycemia 2/2 poorly  controlled type II diabetes mellitus: - Continue SSI for goal CBGs <180  Best practice:  Diet: carb modified, heart healthy Pain/Anxiety/Delirium protocol (if indicated): oxycodone VAP protocol (if indicated): none DVT prophylaxis: SCDs GI prophylaxis: pepcid  Glucose control: SSI Mobility: Bed rest  Code Status: FULL  Family Communication: patient and husband updated via phone at bedside  Disposition: ICU  Critical care time: 35 minutes     Harvie Heck, MD Internal Medicine, PGY-2 09/01/19 11:07 AM Pager # [phone]

## 2019-09-01 NOTE — Progress Notes (Addendum)
[facility] [place]  Subjective  - Chest and abdominal discomfort without much change, controled with pain medication.   Objective 118/73 69 98.5 F (36.9 C) (Oral) 19 91%  Intake/Output Summary (Last 24 hours) at 09/01/2019 0821 Last data filed at 09/01/2019 0600 Gross per 24 hour  Intake 843.97 ml  Output 400 ml  Net 443.97 ml    Palpable pedal pulses B LE, sensation and motor intact B LE. Abdomin soft without TTP. Lungs non labored breathing  Assessment/Planning: Aortic intramural hematoma with penetrating ulcer: Palpable pedal pulses without signs of mal perfusion.   Cont. Medical management with BP control.  Plan for surgical intervention next week per Dr. Stephens Shire assessment.    [name] 09/01/2019 8:21 AM --  Laboratory Lab Results: Recent Labs    08/31/19 0301 09/01/19 0300  WBC 11.4* 11.2*  HGB 9.9* 9.3*  HCT 31.4* 29.4*  PLT 223 227   BMET Recent Labs    08/31/19 0301 09/01/19 0300  NA 132* 132*  K 4.1 4.2  CL 100 100  CO2 25 24  GLUCOSE 143* 153*  BUN 22 25*  CREATININE 1.12* 1.25*  CALCIUM 9.0 8.9    COAG Lab Results  Component Value Date   INR 0.92 04/04/2010   INR 1.0 08/26/2007   INR 1.0 RATIO 05/19/2007   No results found for: PTT   I agree with the above.  I have seen and evaluated the patient.  She continues to improve from a pain management perspective.  She is still having difficulty to control blood pressure.  Recent CT scan shows stable appearance of her dissection.  I am tentatively planning repair next week.  She may require an iliac conduit.  [name]

## 2019-09-02 ENCOUNTER — Inpatient Hospital Stay (HOSPITAL_COMMUNITY): Payer: Medicare Other

## 2019-09-02 DIAGNOSIS — I10 Essential (primary) hypertension: Secondary | ICD-10-CM | POA: Diagnosis not present

## 2019-09-02 DIAGNOSIS — M7989 Other specified soft tissue disorders: Secondary | ICD-10-CM | POA: Diagnosis not present

## 2019-09-02 DIAGNOSIS — I7100 Dissection of unspecified site of aorta: Secondary | ICD-10-CM | POA: Diagnosis not present

## 2019-09-02 DIAGNOSIS — I712 Thoracic aortic aneurysm, without rupture: Secondary | ICD-10-CM | POA: Diagnosis not present

## 2019-09-02 LAB — GLUCOSE, CAPILLARY
Glucose-Capillary: 147 mg/dL — ABNORMAL HIGH (ref 70–99)
Glucose-Capillary: 173 mg/dL — ABNORMAL HIGH (ref 70–99)
Glucose-Capillary: 167 mg/dL — ABNORMAL HIGH (ref 70–99)
Glucose-Capillary: 165 mg/dL — ABNORMAL HIGH (ref 70–99)

## 2019-09-02 LAB — CBC
HCT: 30.6 % — ABNORMAL LOW (ref 36.0–46.0)
Hemoglobin: 9.4 g/dL — ABNORMAL LOW (ref 12.0–15.0)
MCH: 25 pg — ABNORMAL LOW (ref 26.0–34.0)
MCHC: 30.7 g/dL (ref 30.0–36.0)
MCV: 81.4 fL (ref 80.0–100.0)
Platelets: 260 10*3/uL (ref 150–400)
RBC: 3.76 MIL/uL — ABNORMAL LOW (ref 3.87–5.11)
RDW: 21.2 % — ABNORMAL HIGH (ref 11.5–15.5)
WBC: 12.3 10*3/uL — ABNORMAL HIGH (ref 4.0–10.5)
nRBC: 0 % (ref 0.0–0.2)

## 2019-09-02 LAB — BASIC METABOLIC PANEL
Anion gap: 7 (ref 5–15)
BUN: 21 mg/dL (ref 8–23)
CO2: 25 mmol/L (ref 22–32)
Calcium: 8.8 mg/dL — ABNORMAL LOW (ref 8.9–10.3)
Chloride: 105 mmol/L (ref 98–111)
Creatinine, Ser: 1.14 mg/dL — ABNORMAL HIGH (ref 0.44–1.00)
GFR calc Af Amer: 57 mL/min — ABNORMAL LOW (ref >60)
GFR calc non Af Amer: 49 mL/min — ABNORMAL LOW (ref >60)
Glucose, Bld: 141 mg/dL — ABNORMAL HIGH (ref 70–99)
Potassium: 4.3 mmol/L (ref 3.5–5.1)
Sodium: 137 mmol/L (ref 135–145)

## 2019-09-02 MED ORDER — PNEUMOCOCCAL VAC POLYVALENT 25 MCG/0.5ML IJ INJ
0.5000 mL | INJECTION | INTRAMUSCULAR | Status: DC | PRN
  Filled 2019-09-02: qty 0.5

## 2019-09-02 MED ORDER — LIDOCAINE 4 % EX CREA
TOPICAL_CREAM | Freq: Four times a day (QID) | CUTANEOUS | Status: DC | PRN
  Administered 2019-09-02 – 2019-09-06 (×2): 1 via TOPICAL
  Filled 2019-09-02: qty 5

## 2019-09-02 MED ORDER — BISACODYL 10 MG RE SUPP
10.0000 mg | Freq: Once | RECTAL | Status: AC
  Administered 2019-09-02: 10 mg via RECTAL
  Filled 2019-09-02: qty 1

## 2019-09-02 MED ORDER — HYDROCHLOROTHIAZIDE 25 MG PO TABS: 25.0000 mg | ORAL_TABLET | Freq: Every day | ORAL | Status: DC

## 2019-09-02 MED ORDER — HYDROCHLOROTHIAZIDE 25 MG PO TABS
25.0000 mg | ORAL_TABLET | Freq: Every day | ORAL | Status: DC
  Administered 2019-09-02 – 2019-09-03 (×2): 25 mg via ORAL
  Filled 2019-09-02 (×2): qty 1

## 2019-09-02 NOTE — Progress Notes (Signed)
NAME:  [name], MRN:  [id number], DOB:  [birth year], LOS: 5 ADMISSION DATE:  08/28/2019, CONSULTATION DATE:  08/28/2019 REFERRING MD:  ED Provider, CHIEF COMPLAINT:  Abdominal pain 2/2 descending aortic aneurysm    Brief History   69 year old female with PMHx of hypertension, tobacco use disorder, atrial fibrillation, hypothyroidism presented to [facility] with abdominal pain for 2 days. She was found to have a descending thoracic aortic aneurysm and was transferred to [facility] for further management.   Past Medical History   Past Medical History:  Diagnosis Date  . Anemia   . Anxiety   . Arthritis   . Atrial fib/flutter, transient   . COPD (chronic obstructive pulmonary disease) ([place])   . Depression   . Diabetes mellitus   . Diverticulosis   . DJD (degenerative joint disease)   . GERD (gastroesophageal reflux disease)   . Gout   . History of pneumonia   . Hypertension   . Hypothyroidism   . Internal hemorrhoids    Significant Hospital Events   8/8 transferred from [facility]  Consults:  Vascular surgery  Procedures:  None  Significant Diagnostic Tests:   CT angio chest/abd/pel 8/08 >> acute appearing intramural hematoma of distal aortic arch and descending thoracic aorta originating at or distal to Lt [place] artery measuring 3.7 x 3.5 cm and extends to level of Rt renal artery mild centrilobular emphysema, scarring at lung bases  CT angio chest/abd/pel 8/11 >> stable appearance of acute intramural hematoma arising distal to the takeoff of the left subclavian artery and terminating at the level of the proximal abdominal aorta; previously demonstrated possible penetrating ulcer arising from the descending thoracic aorta no longer well appreciated and likely resolved in the interim; bilateral pleural effusions with mild interlobular septal thickening suggestive of volume overload  Micro Data:  COVID 8/8 > negative MRSA 8/8 > negative  Antimicrobials:  None   Interim  history/subjective:  Overnight, no acute events. No bowel movement in 4 days; reports some abdominal discomfort Now off the esmolol gtt  Objective   Blood pressure (!) 145/81, pulse 75, temperature 98.7 F (37.1 C), temperature source Oral, resp. rate (!) 21, height 5' (1.524 m), weight 58 kg, SpO2 95 %.        Intake/Output Summary (Last 24 hours) at 09/02/2019 0730 Last data filed at 09/02/2019 0600 Gross per 24 hour  Intake 521.58 ml  Output 1550 ml  Net -1028.42 ml   Filed Weights   08/29/19 0426 08/30/19 0407 08/31/19 0404  Weight: 52.3 kg 57.8 kg 58 kg    Examination: General: thin appearing female, no acute distress HENT: 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 CTA chest/abd/pelvis with stable intramural hematoma from left subclavian to proximal abdomimal aorta and prior penetrating ulcer from descending thoracic aorta appears resolved.  - Patient on clevedipine gtt as well as coreg, clonidine, HCTZ, hydralazine.  - Hx of angioedema with lisinopril and amlodipine per pt.  - Will increase  hctz at this time to improve BP control with goal to get pt off of the cleviprex gtt - Will consider increase in labetalol if remains above goal of SBP  120 - Plans for surgery next week per Dr Trula Slade   Hx of angioedema with ACEi: - Noted to have angioedema with losartan as well during admission.   Hx of tobacco use disorder - Continue to encourage smoking cessation  Hypothyroidism: - Continue synthroid   Chronic back pain - Oxycodone 10mg  q4h prn - Continue zoloft  Hyperglycemia 2/2  poorly controlled type II diabetes mellitus: - Continue SSI for goal CBGs <180  Constipation: Miralax 12g bid, Colace daily prn Will add suppository at this time   Best practice:  Diet: carb modified, heart healthy Pain/Anxiety/Delirium protocol (if indicated): oxycodone VAP protocol (if indicated): none DVT prophylaxis: SCDs GI prophylaxis: pepcid  Glucose control: SSI Mobility: Bed rest  Code Status: FULL  Family Communication: patient updated at bedside; attempted to contact patient's son, Steffanie Dunn. Unable to reach at this time.  Disposition: ICU  Critical care time: 35 minutes     Harvie Heck, MD Internal Medicine, PGY-2 09/02/19 7:30 AM Pager # [phone]

## 2019-09-02 NOTE — Progress Notes (Signed)
Upper extremity venous has been completed.   Preliminary results in CV Proc.   Abram Sander 09/02/2019 3:31 PM

## 2019-09-02 NOTE — Progress Notes (Addendum)
Progress Note    09/02/2019 7:25 AM * No surgery found *  Subjective:  My first examination of patient. She is awake, alert on phone with family member. Primary complaint this morning is LUE where IV evidently infiltrated and is constipated.  She got up to [facility] overnight without results.  No back or chest pain. No SOB.   Vitals:   09/02/19 0530 09/02/19 0600  BP:    Pulse: 73 75  Resp: 14 (!) 21  Temp:    SpO2: 95% 95%    Physical Exam: Cardiac:  RRR Lungs: CTAB Extremities: right radial art line. 5/5 bil hand grip strength. 2+ left radial pulse. 1-2+ bilateral DP pulses. LE motor and sensation intact Abdomen:  Soft, ND, NT  CBC    Component Value Date/Time   WBC 12.3 (H) 09/02/2019 0612   RBC 3.76 (L) 09/02/2019 0612   HGB 9.4 (L) 09/02/2019 0612   HCT 30.6 (L) 09/02/2019 0612   PLT 260 09/02/2019 0612   MCV 81.4 09/02/2019 0612   MCH 25.0 (L) 09/02/2019 0612   MCHC 30.7 09/02/2019 0612   RDW 21.2 (H) 09/02/2019 0612   LYMPHSABS 1.3 05/06/2017 0353   MONOABS 0.3 05/06/2017 0353   EOSABS 0.0 05/06/2017 0353   BASOSABS 0.0 05/06/2017 0353    BMET    Component Value Date/Time   NA 137 09/02/2019 0612   K 4.3 09/02/2019 0612   CL 105 09/02/2019 0612   CO2 25 09/02/2019 0612   GLUCOSE 141 (H) 09/02/2019 0612   BUN 21 09/02/2019 0612   CREATININE 1.14 (H) 09/02/2019 0612   CALCIUM 8.8 (L) 09/02/2019 0612   GFRNONAA 49 (L) 09/02/2019 0612   GFRAA 57 (L) 09/02/2019 0612     Intake/Output Summary (Last 24 hours) at 09/02/2019 0725 Last data filed at 09/02/2019 0600 Gross per 24 hour  Intake 521.58 ml  Output 1550 ml  Net -1028.42 ml    HOSPITAL MEDICATIONS Scheduled Meds: . Chlorhexidine Gluconate Cloth  6 each Topical Daily  . cholecalciferol  1,000 Units Oral q morning - 10a  . cloNIDine  0.2 mg Oral TID  . famotidine  20 mg Oral Daily  . hydrALAZINE  100 mg Oral Q8H  . hydrochlorothiazide  12.5 mg Oral Daily  . insulin aspart  0-15 Units  Subcutaneous TID WC  . insulin aspart  0-5 Units Subcutaneous QHS  . labetalol  300 mg Oral TID  . levothyroxine  200 mcg Oral QAC breakfast  . nicotine  7 mg Transdermal Daily  . polyethylene glycol  17 g Oral BID  . pravastatin  20 mg Oral q1800  . sertraline  50 mg Oral QHS   Continuous Infusions: . sodium chloride Stopped (08/30/19 0233)  . clevidipine 5 mg/hr (09/02/19 0600)  . esmolol Stopped (09/02/19 0240)   PRN Meds:.sodium chloride, acetaminophen, alum & mag hydroxide-simeth, docusate sodium, HYDROmorphone (DILAUDID) injection, ondansetron, oxyCODONE  Assessment: Aortic intramural hematoma with penetrating ulcer. Remains on clevidipine (currently 6mg /hr) and scheduled PO BP meds. Hgb stable. Scr 1.14   Plan: -Continue current treatment plan. - Surgical intervention likely early next week>as per Dr. Einar Gip, PA-C [facility] [phone] 09/02/2019  7:25 AM    I agree with the above.  I have seen and examined the patient.  She is improving with regards to her pain, however her BP still requires IV meds for control.  She isOK to ambulate.  Plan for TEVAR late next week or the following  week.  She can go home prior to surgery.  She would benefit from referral to HTN clinic at discharge  [facility]

## 2019-09-02 NOTE — Progress Notes (Signed)
Patient ambulated independently with walker for assistance, oxygen via [place] @4L /min maintained, pt talkative and pleasant throughout walk and appreciative to get out of bed. SpO2 90-94% while ambulating. Patient now in recliner chair and comforable.

## 2019-09-03 ENCOUNTER — Inpatient Hospital Stay (HOSPITAL_COMMUNITY): Payer: Medicare Other

## 2019-09-03 DIAGNOSIS — I7100 Dissection of unspecified site of aorta: Secondary | ICD-10-CM | POA: Diagnosis not present

## 2019-09-03 DIAGNOSIS — I712 Thoracic aortic aneurysm, without rupture: Secondary | ICD-10-CM | POA: Diagnosis not present

## 2019-09-03 DIAGNOSIS — I10 Essential (primary) hypertension: Secondary | ICD-10-CM | POA: Diagnosis not present

## 2019-09-03 LAB — GLUCOSE, CAPILLARY
Glucose-Capillary: 96 mg/dL (ref 70–99)
Glucose-Capillary: 121 mg/dL — ABNORMAL HIGH (ref 70–99)
Glucose-Capillary: 112 mg/dL — ABNORMAL HIGH (ref 70–99)
Glucose-Capillary: 201 mg/dL — ABNORMAL HIGH (ref 70–99)

## 2019-09-03 LAB — BASIC METABOLIC PANEL
Anion gap: 7 (ref 5–15)
BUN: 22 mg/dL (ref 8–23)
CO2: 27 mmol/L (ref 22–32)
Calcium: 8.9 mg/dL (ref 8.9–10.3)
Chloride: 101 mmol/L (ref 98–111)
Creatinine, Ser: 1.19 mg/dL — ABNORMAL HIGH (ref 0.44–1.00)
GFR calc Af Amer: 54 mL/min — ABNORMAL LOW (ref >60)
GFR calc non Af Amer: 47 mL/min — ABNORMAL LOW (ref >60)
Glucose, Bld: 122 mg/dL — ABNORMAL HIGH (ref 70–99)
Potassium: 3.7 mmol/L (ref 3.5–5.1)
Sodium: 135 mmol/L (ref 135–145)

## 2019-09-03 LAB — CBC
HCT: 29.1 % — ABNORMAL LOW (ref 36.0–46.0)
Hemoglobin: 8.8 g/dL — ABNORMAL LOW (ref 12.0–15.0)
MCH: 25 pg — ABNORMAL LOW (ref 26.0–34.0)
MCHC: 30.2 g/dL (ref 30.0–36.0)
MCV: 82.7 fL (ref 80.0–100.0)
Platelets: 242 10*3/uL (ref 150–400)
RBC: 3.52 MIL/uL — ABNORMAL LOW (ref 3.87–5.11)
RDW: 21.2 % — ABNORMAL HIGH (ref 11.5–15.5)
WBC: 10.9 10*3/uL — ABNORMAL HIGH (ref 4.0–10.5)
nRBC: 0 % (ref 0.0–0.2)

## 2019-09-03 MED ORDER — DIPHENHYDRAMINE HCL 12.5 MG/5ML PO ELIX
12.5000 mg | ORAL_SOLUTION | Freq: Four times a day (QID) | ORAL | Status: DC | PRN
  Administered 2019-09-03 – 2019-09-08 (×15): 12.5 mg via ORAL
  Filled 2019-09-03 (×16): qty 10

## 2019-09-03 MED ORDER — SPIRONOLACTONE 25 MG PO TABS
25.0000 mg | ORAL_TABLET | Freq: Every day | ORAL | Status: DC
  Administered 2019-09-03 – 2019-09-04 (×2): 25 mg via ORAL
  Filled 2019-09-03 (×2): qty 1

## 2019-09-03 MED ORDER — IOHEXOL 350 MG/ML SOLN
75.0000 mL | Freq: Once | INTRAVENOUS | Status: AC | PRN
  Administered 2019-09-03: 75 mL via INTRAVENOUS

## 2019-09-03 MED ORDER — CHLORTHALIDONE 25 MG PO TABS
25.0000 mg | ORAL_TABLET | Freq: Every day | ORAL | Status: DC
  Administered 2019-09-03 – 2019-09-11 (×8): 25 mg via ORAL
  Filled 2019-09-03 (×10): qty 1

## 2019-09-03 MED ORDER — SODIUM CHLORIDE 0.9 % IV SOLN: INTRAVENOUS | Status: AC

## 2019-09-03 NOTE — Progress Notes (Signed)
Verbal order from Dr. Ander Slade to turn off Cleviprex.

## 2019-09-03 NOTE — Progress Notes (Signed)
[facility] [place]  Subjective  - no back or chest pain today feet feel ok   Objective 133/71 68 99.3 F (37.4 C) (Oral) 17 92%  Intake/Output Summary (Last 24 hours) at 09/03/2019 0940 Last data filed at 09/03/2019 0900 Gross per 24 hour  Intake 242.99 ml  Output --  Net 242.99 ml    Assessment/Planning: Superficial thrombophlebitis left arm avoid IVs warm compresses  Aortic intramural hematoma currently asymptomatic repair scheduled per Dr Trula Slade  BP reasonable this morning medical management per [facility] 09/03/2019 9:40 AM --  Laboratory Lab Results: Recent Labs    09/02/19 0612 09/03/19 0529  WBC 12.3* 10.9*  HGB 9.4* 8.8*  HCT 30.6* 29.1*  PLT 260 242   BMET Recent Labs    09/02/19 0612 09/03/19 0529  NA 137 135  K 4.3 3.7  CL 105 101  CO2 25 27  GLUCOSE 141* 122*  BUN 21 22  CREATININE 1.14* 1.19*  CALCIUM 8.8* 8.9    COAG Lab Results  Component Value Date   INR 0.92 04/04/2010   INR 1.0 08/26/2007   INR 1.0 RATIO 05/19/2007   No results found for: PTT

## 2019-09-03 NOTE — Progress Notes (Signed)
[place] Progress Note Patient Name: Jovon Streetman DOB: 05/02/1950 MRN: [id number]   Date of Service  09/03/2019  HPI/Events of Note  Pruritus.  eICU Interventions  Benadryl 12.5 mg po Q 6 hours PRN itching.        Kerry Kass Rakiyah Esch 09/03/2019, 12:39 AM

## 2019-09-03 NOTE — Progress Notes (Signed)
Patient had reported mid chest pain this afternoon.  Blood pressure did spike failure following taken off Cleviprex  Cleviprex restarted  Stat CT chest with contrast did not reveal any new finding-stable aneurysmal dilatation of a ascending aorta, stable aortic hematoma Small pleural effusion with some atelectasis, patient has no symptoms of infection  Continue Cleviprex Continue other blood pressure medications

## 2019-09-03 NOTE — Progress Notes (Addendum)
Dr. Ander Slade informed of patient's chest pain. He stated to increase Cleviprex as needed to get patient's BP below 917 systolic.

## 2019-09-03 NOTE — Progress Notes (Signed)
NAME:  [name], MRN:  [id number], DOB:  [birth year], LOS: 6 ADMISSION DATE:  08/28/2019, CONSULTATION DATE:  08/28/2019 REFERRING MD:  ED Provider, CHIEF COMPLAINT:  Abdominal pain 2/2 descending aortic aneurysm    Brief History   69 year old female with PMHx of hypertension, tobacco use disorder, atrial fibrillation, hypothyroidism presented to [facility] with abdominal pain for 2 days. She was found to have a descending thoracic aortic aneurysm and was transferred to [facility] for further management.   Past Medical History   Past Medical History:  Diagnosis Date  . Anemia   . Anxiety   . Arthritis   . Atrial fib/flutter, transient   . COPD (chronic obstructive pulmonary disease) ([place])   . Depression   . Diabetes mellitus   . Diverticulosis   . DJD (degenerative joint disease)   . GERD (gastroesophageal reflux disease)   . Gout   . History of pneumonia   . Hypertension   . Hypothyroidism   . Internal hemorrhoids    Significant Hospital Events   8/8 transferred from [facility]  Consults:  Vascular surgery  Procedures:  None  Significant Diagnostic Tests:   CT angio chest/abd/pel 8/08 >> acute appearing intramural hematoma of distal aortic arch and descending thoracic aorta originating at or distal to Lt [place] artery measuring 3.7 x 3.5 cm and extends to level of Rt renal artery mild centrilobular emphysema, scarring at lung bases  Echo 8/9 >> LVEF 65-70% without RWMA; grade I diastolic dyusfunction; trivial mitral regurgitation   CT angio chest/abd/pel 8/11 >> stable appearance of acute intramural hematoma arising distal to the takeoff of the left subclavian artery and terminating at the level of the proximal abdominal aorta; previously demonstrated possible penetrating ulcer arising from the descending thoracic aorta no longer well appreciated and likely resolved in the interim; bilateral pleural effusions with mild interlobular septal thickening suggestive of volume overload  LUE  Vascular US 8/13 >> No DVT. Acute superficial vein thrombosis of L basilic vein and L cephalic vein; acute occlusive superficial thrombophlebitis in mid basilic vein and cephalic vein   Micro Data:  COVID 8/8 > negative MRSA 8/8 > negative  Antimicrobials:  None   Interim history/subjective:  Noted to have left arm pain for which LUE vascular US reveal superficial venous thrombophlebitis. Overnight, no acute concerns. Able to ambulate around unit yesterday.   Objective   Blood pressure 118/63, pulse 60, temperature 98.3 F (36.8 C), temperature source Oral, resp. rate 13, height 5' (1.524 m), weight 58.2 kg, SpO2 99 %.        Intake/Output Summary (Last 24 hours) at 09/03/2019 0733 Last data filed at 09/03/2019 0700 Gross per 24 hour  Intake 260.97 ml  Output --  Net 260.97 ml   Filed Weights   08/30/19 0407 08/31/19 0404 09/03/19 0355  Weight: 57.8 kg 58 kg 58.2 kg    Examination: General: thin appearing female, no acute distress HENT: [place]/AT, EOMI nl, PERRL Lungs: Clear to auscultation bilaterally Cardiovascular: RRR, S1 and S2 present, No m/r/g Abdomen: soft, nontender, mildly distended, normoactive bowel sounds  Extremities: warm and dry, distal pulses intact; left upper extremity with mild edema  Neuro: awake, alert oriented, no focal deficits noted   Assessment & Plan:  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 CTA chest/abd/pelvis with stable intramural hematoma from left subclavian to proximal abdomimal aorta and prior penetrating ulcer from descending thoracic aorta appears resolved.  -  Patient on clevedipine gtt as well as coreg, clonidine, HCTZ, hydralazine.  - Will add spironolactone 25mg  daily to wean off the cleviprex gtt.  - Hx of angioedema with lisinopril and amlodipine per pt.  - Plans for surgery next week per Dr Trula Slade   Hx of angioedema with ACEi - Noted to have  angioedema with losartan as well during admission.   Superficial thrombophlebitis of left cephalic and basilic vein  - Warm compresses  - Pain control   Hx of tobacco use disorder - Continue to encourage smoking cessation  Hypothyroidism: - Continue synthroid   Chronic back pain - Oxycodone 10mg  q4h prn - Continue zoloft  Hyperglycemia 2/2 poorly controlled type II diabetes mellitus: - Continue SSI for goal CBGs <180  Constipation: Has not had BM yet despite suppository  Miralax 12g bid, Colace daily prn Will add enema at this time   Best practice:  Diet: carb modified, heart healthy Pain/Anxiety/Delirium protocol (if indicated): oxycodone VAP protocol (if indicated): none DVT prophylaxis: SCDs GI prophylaxis: pepcid  Glucose control: SSI Mobility: Bed rest  Code Status: FULL  Family Communication: patient updated at bedside; attempted to contact patient's son, Steffanie Dunn.  Disposition: ICU  Critical care time: 35 minutes     Harvie Heck, MD Internal Medicine, PGY-2 09/03/19 7:33 AM Pager # [phone]

## 2019-09-03 NOTE — Progress Notes (Signed)
The patient was resting comfortably. This RN went into her room to administer scheduled oral medications and asked her about her pain. She reports the pain she experienced in her chest and back is gone, but she reports pain to left upper chest, under axilla but only with inspiration. Dr. Ander Slade informed. CTA ordered.

## 2019-09-03 NOTE — Progress Notes (Signed)
Pt transported to and from CT with this RN and Estate manager/land agent without incident.

## 2019-09-03 NOTE — Progress Notes (Signed)
Pt reports sudden onset of central chest pain that radiates to her back "feels tight." Cleviprex turned back on and Dr. Ander Slade paged. EKG to be done. BP 166-94, HR-67, RR-20

## 2019-09-04 DIAGNOSIS — I10 Essential (primary) hypertension: Secondary | ICD-10-CM | POA: Diagnosis not present

## 2019-09-04 DIAGNOSIS — I712 Thoracic aortic aneurysm, without rupture: Secondary | ICD-10-CM | POA: Diagnosis not present

## 2019-09-04 DIAGNOSIS — I7100 Dissection of unspecified site of aorta: Secondary | ICD-10-CM | POA: Diagnosis not present

## 2019-09-04 LAB — BASIC METABOLIC PANEL
Anion gap: 8 (ref 5–15)
BUN: 15 mg/dL (ref 8–23)
CO2: 29 mmol/L (ref 22–32)
Calcium: 9.1 mg/dL (ref 8.9–10.3)
Chloride: 100 mmol/L (ref 98–111)
Creatinine, Ser: 1.02 mg/dL — ABNORMAL HIGH (ref 0.44–1.00)
GFR calc Af Amer: >60 mL/min (ref >60)
GFR calc non Af Amer: 56 mL/min — ABNORMAL LOW (ref >60)
Glucose, Bld: 119 mg/dL — ABNORMAL HIGH (ref 70–99)
Potassium: 3.6 mmol/L (ref 3.5–5.1)
Sodium: 137 mmol/L (ref 135–145)

## 2019-09-04 LAB — CBC
HCT: 30.4 % — ABNORMAL LOW (ref 36.0–46.0)
Hemoglobin: 9.4 g/dL — ABNORMAL LOW (ref 12.0–15.0)
MCH: 25.2 pg — ABNORMAL LOW (ref 26.0–34.0)
MCHC: 30.9 g/dL (ref 30.0–36.0)
MCV: 81.5 fL (ref 80.0–100.0)
Platelets: 300 10*3/uL (ref 150–400)
RBC: 3.73 MIL/uL — ABNORMAL LOW (ref 3.87–5.11)
RDW: 20.9 % — ABNORMAL HIGH (ref 11.5–15.5)
WBC: 10.4 10*3/uL (ref 4.0–10.5)
nRBC: 0 % (ref 0.0–0.2)

## 2019-09-04 LAB — GLUCOSE, CAPILLARY
Glucose-Capillary: 121 mg/dL — ABNORMAL HIGH (ref 70–99)
Glucose-Capillary: 165 mg/dL — ABNORMAL HIGH (ref 70–99)
Glucose-Capillary: 127 mg/dL — ABNORMAL HIGH (ref 70–99)
Glucose-Capillary: 183 mg/dL — ABNORMAL HIGH (ref 70–99)

## 2019-09-04 MED ORDER — SPIRONOLACTONE 25 MG PO TABS
50.0000 mg | ORAL_TABLET | Freq: Every day | ORAL | Status: DC
  Administered 2019-09-05 – 2019-09-11 (×6): 50 mg via ORAL
  Filled 2019-09-04 (×7): qty 2

## 2019-09-04 NOTE — Progress Notes (Signed)
NAME:  [name], MRN:  [id number], DOB:  1950/08/14, LOS: 7 ADMISSION DATE:  08/28/2019, CONSULTATION DATE:  08/28/2019 REFERRING MD:  ED Provider, CHIEF COMPLAINT:  Abdominal pain 2/2 descending aortic aneurysm    Brief History   69 year old female with PMHx of hypertension, tobacco use disorder, atrial fibrillation, hypothyroidism presented to [facility] with abdominal pain for 2 days. She was found to have a descending thoracic aortic aneurysm and was transferred to [facility] for further management.   Past Medical History   Past Medical History:  Diagnosis Date  . Anemia   . Anxiety   . Arthritis   . Atrial fib/flutter, transient   . COPD (chronic obstructive pulmonary disease) ([place])   . Depression   . Diabetes mellitus   . Diverticulosis   . DJD (degenerative joint disease)   . GERD (gastroesophageal reflux disease)   . Gout   . History of pneumonia   . Hypertension   . Hypothyroidism   . Internal hemorrhoids    Significant Hospital Events   8/8 transferred from [facility]  Consults:  Vascular surgery  Procedures:  None  Significant Diagnostic Tests:   CT angio chest/abd/pel 8/08 >> acute appearing intramural hematoma of distal aortic arch and descending thoracic aorta originating at or distal to Lt [place] artery measuring 3.7 x 3.5 cm and extends to level of Rt renal artery mild centrilobular emphysema, scarring at lung bases  Echo 8/9 >> LVEF 65-70% without RWMA; grade I diastolic dyusfunction; trivial mitral regurgitation   CT angio chest/abd/pel 8/11 >> stable appearance of acute intramural hematoma arising distal to the takeoff of the left subclavian artery and terminating at the level of the proximal abdominal aorta; previously demonstrated possible penetrating ulcer arising from the descending thoracic aorta no longer well appreciated and likely resolved in the interim; bilateral pleural effusions with mild interlobular septal thickening suggestive of volume overload  LUE  Vascular US 8/13 >> No DVT. Acute superficial vein thrombosis of L basilic vein and L cephalic vein; acute occlusive superficial thrombophlebitis in mid basilic vein and cephalic vein   Micro Data:  COVID 8/8 > negative MRSA 8/8 > negative  Antimicrobials:  None   Interim history/subjective:  It chest pain yesterday, repeat CT scan of the chest is stable Having some chest discomfort this morning as well, will medicate with pain medications Still requiring IV Cleviprex  Objective   Blood pressure 120/73, pulse 72, temperature 98.9 F (37.2 C), temperature source Oral, resp. rate (!) 21, height 5' (1.524 m), weight 66.8 kg, SpO2 91 %.        Intake/Output Summary (Last 24 hours) at 09/04/2019 1023 Last data filed at 09/04/2019 0700 Gross per 24 hour  Intake 1632.28 ml  Output 2300 ml  Net -667.72 ml   Filed Weights   08/31/19 0404 09/03/19 0355 09/04/19 0113  Weight: 58 kg 58.2 kg 66.8 kg    Examination: General: Does not appear to be in distress HENT: /AT, EOMI nl, PERRL Lungs: Clear to auscultation, decreased air entry at the bases Cardiovascular: S1-S2 appreciated  abdomen: Soft, nontender  extremities: warm and dry, distal pulses intact; left upper extremity with mild edema  Neuro: awake, alert oriented, no focal deficits noted   Assessment & Plan:  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 CTA chest/abd/pelvis with stable intramural hematoma from left subclavian to proximal abdomimal aorta and prior penetrating ulcer from descending thoracic aorta  appears resolved.  - Patient on clevedipine gtt as well as coreg, clonidine, chlorthalidone, hydralazine, spironolactone added.  -We will continue to attempt to wean off Cleviprex - Hx of angioedema with lisinopril and amlodipine per pt.  - Plans for surgery next week per Dr Trula Slade   Resistant hypertension -On chlorthalidone 25  mg -Labetalol 300 mg p.o. 3 times daily -Spironolactone 50 mg daily -Clonidine 0.23 times daily -Cleviprex IV at 9 mg/hr -  Hx of angioedema with ACEi - Noted to have angioedema with losartan as well during admission.  -She maintains she did have tongue swelling with amlodipine as well  Superficial thrombophlebitis of left cephalic and basilic vein  - Warm compresses, appears to be better - Pain control   Hx of tobacco use disorder - Continue to encourage smoking cessation  Hypothyroidism: - Continue synthroid   Chronic back pain - Oxycodone 10mg  q4h prn - Continue zoloft  Hyperglycemia 2/2 poorly controlled type II diabetes mellitus: - Continue SSI for goal CBGs <180  Constipation: -Continue MiraLAX -Continue Colace   Best practice:  Diet: carb modified, heart healthy Pain/Anxiety/Delirium protocol (if indicated): oxycodone VAP protocol (if indicated): none DVT prophylaxis: SCDs GI prophylaxis: pepcid  Glucose control: SSI Mobility: Bed rest  Code Status: FULL  Family Communication: patient updated at bedside; attempted to contact patient's son, Steffanie Dunn.  Disposition: ICU  The patient is critically ill with multiple organ systems failure and requires high complexity decision making for assessment and support, frequent evaluation and titration of therapies, application of advanced monitoring technologies and extensive interpretation of multiple databases. Critical Care Time devoted to patient care services described in this note independent of APP/resident time (if applicable)  is 32 minutes.   Sherrilyn Rist MD [facility] Personal pager: [phone] If unanswered, please page CCM On-call: [phone]

## 2019-09-05 ENCOUNTER — Encounter (HOSPITAL_COMMUNITY): Payer: Medicare Other

## 2019-09-05 ENCOUNTER — Inpatient Hospital Stay: Payer: Self-pay

## 2019-09-05 DIAGNOSIS — I7100 Dissection of unspecified site of aorta: Secondary | ICD-10-CM | POA: Diagnosis not present

## 2019-09-05 DIAGNOSIS — I10 Essential (primary) hypertension: Secondary | ICD-10-CM | POA: Diagnosis not present

## 2019-09-05 DIAGNOSIS — I712 Thoracic aortic aneurysm, without rupture: Secondary | ICD-10-CM | POA: Diagnosis not present

## 2019-09-05 LAB — BASIC METABOLIC PANEL
Anion gap: 10 (ref 5–15)
BUN: 12 mg/dL (ref 8–23)
CO2: 26 mmol/L (ref 22–32)
Calcium: 9.4 mg/dL (ref 8.9–10.3)
Chloride: 100 mmol/L (ref 98–111)
Creatinine, Ser: 1.03 mg/dL — ABNORMAL HIGH (ref 0.44–1.00)
GFR calc Af Amer: >60 mL/min (ref >60)
GFR calc non Af Amer: 55 mL/min — ABNORMAL LOW (ref >60)
Glucose, Bld: 162 mg/dL — ABNORMAL HIGH (ref 70–99)
Potassium: 3.7 mmol/L (ref 3.5–5.1)
Sodium: 136 mmol/L (ref 135–145)

## 2019-09-05 LAB — CBC
HCT: 31.6 % — ABNORMAL LOW (ref 36.0–46.0)
Hemoglobin: 9.8 g/dL — ABNORMAL LOW (ref 12.0–15.0)
MCH: 25.4 pg — ABNORMAL LOW (ref 26.0–34.0)
MCHC: 31 g/dL (ref 30.0–36.0)
MCV: 81.9 fL (ref 80.0–100.0)
Platelets: 291 10*3/uL (ref 150–400)
RBC: 3.86 MIL/uL — ABNORMAL LOW (ref 3.87–5.11)
RDW: 20.8 % — ABNORMAL HIGH (ref 11.5–15.5)
WBC: 10.6 10*3/uL — ABNORMAL HIGH (ref 4.0–10.5)
nRBC: 0 % (ref 0.0–0.2)

## 2019-09-05 LAB — TSH: TSH: 0.111 u[IU]/mL — ABNORMAL LOW (ref 0.350–4.500)

## 2019-09-05 LAB — T4, FREE: Free T4: 2.04 ng/dL — ABNORMAL HIGH (ref 0.61–1.12)

## 2019-09-05 LAB — GLUCOSE, CAPILLARY
Glucose-Capillary: 130 mg/dL — ABNORMAL HIGH (ref 70–99)
Glucose-Capillary: 123 mg/dL — ABNORMAL HIGH (ref 70–99)
Glucose-Capillary: 224 mg/dL — ABNORMAL HIGH (ref 70–99)
Glucose-Capillary: 133 mg/dL — ABNORMAL HIGH (ref 70–99)

## 2019-09-05 MED ORDER — NIFEDIPINE ER OSMOTIC RELEASE 60 MG PO TB24
60.0000 mg | ORAL_TABLET | Freq: Every day | ORAL | Status: DC
  Administered 2019-09-05 – 2019-09-06 (×2): 60 mg via ORAL
  Filled 2019-09-05 (×4): qty 1

## 2019-09-05 MED ORDER — LEVOTHYROXINE SODIUM 25 MCG PO TABS: 100.0000 ug | ORAL_TABLET | Freq: Every day | ORAL | Status: DC

## 2019-09-05 NOTE — Progress Notes (Signed)
Reviewed patient for PICC insertion.  Not able to proceed with PICC placement as patient has right sided pacer with is counter indicated for PICC insertion.   Left arm restricted leaving no arm to place PICC.  If central access required would suggest a central line or referral to interventional radiology

## 2019-09-05 NOTE — Progress Notes (Addendum)
NAME:  [name], MRN:  [id number], DOB:  Apr 26, 1950, LOS: 8 ADMISSION DATE:  08/28/2019, CONSULTATION DATE:  08/28/2019 REFERRING MD:  ED Provider, CHIEF COMPLAINT:  Abdominal pain 2/2 descending aortic aneurysm    Brief History   69 year old female with PMHx of hypertension, tobacco use disorder, atrial fibrillation, hypothyroidism presented to [facility] with abdominal pain for 2 days. She was found to have a descending thoracic aortic aneurysm and was transferred to [facility] for further management.   Past Medical History   Past Medical History:  Diagnosis Date  . Anemia   . Anxiety   . Arthritis   . Atrial fib/flutter, transient   . COPD (chronic obstructive pulmonary disease) ([place])   . Depression   . Diabetes mellitus   . Diverticulosis   . DJD (degenerative joint disease)   . GERD (gastroesophageal reflux disease)   . Gout   . History of pneumonia   . Hypertension   . Hypothyroidism   . Internal hemorrhoids    Significant Hospital Events   8/8 transferred from [facility]  Consults:  Vascular surgery  Procedures:  None  Significant Diagnostic Tests:   CT angio chest/abd/pel 8/08 >> acute appearing intramural hematoma of distal aortic arch and descending thoracic aorta originating at or distal to Lt [place] artery measuring 3.7 x 3.5 cm and extends to level of Rt renal artery mild centrilobular emphysema, scarring at lung bases  Echo 8/9 >> LVEF 65-70% without RWMA; grade I diastolic dyusfunction; trivial mitral regurgitation   CT angio chest/abd/pel 8/11 >> stable appearance of acute intramural hematoma arising distal to the takeoff of the left subclavian artery and terminating at the level of the proximal abdominal aorta; previously demonstrated possible penetrating ulcer arising from the descending thoracic aorta no longer well appreciated and likely resolved in the interim; bilateral pleural effusions with mild interlobular septal thickening suggestive of volume overload  LUE  Vascular US 8/13 >> No DVT. Acute superficial vein thrombosis of L basilic vein and L cephalic vein; acute occlusive superficial thrombophlebitis in mid basilic vein and cephalic vein   CTA chest 8/14 >> stable known intramural thoracic aortic hematoma with stable aneurysm dilation of the ascending thoracic aorta measuring 4 cm in AP diameter.  Slight worsening of small simple left pleural effusion with associated by basilar atelectasis.  Slightly nodular component to some of this posterior dependent density over the mid lung as cannot exclude superimposed infection   Micro Data:  COVID 8/8 > negative MRSA 8/8 > negative  Antimicrobials:  None   Interim history/subjective:  No acute event overnight  Patient is seen at bedside today.  She is pleasant and in no acute distress.  She denies chest pains, shortness of breath or headache.  She complains of ongoing pain on her left elbow due to superficial thrombophlebitis which is better with ice compression.  Patient states that her surgery is possibly planned for Friday.  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 rate (!) 21, height 5' (1.524 m), weight 66.7 kg, SpO2 91 %.        Intake/Output Summary (Last 24 hours) at 09/05/2019 0912 Last data filed at 09/05/2019 0622 Gross per 24 hour  Intake 1362 ml  Output 2200 ml  Net -838 ml   Filed Weights   09/03/19 0355 09/04/19  0113 09/05/19 0357  Weight: 58.2 kg 66.8 kg 66.7 kg    Physical Exam Constitutional:      General: She is not in acute distress.    Appearance: Normal appearance. She is not toxic-appearing.  HENT:     Head: Normocephalic.  Eyes:     General: No scleral icterus.       Right eye: No discharge.        Left eye: No discharge.     Conjunctiva/sclera: Conjunctivae normal.   Cardiovascular:     Rate and Rhythm: Normal rate and regular rhythm.     Heart sounds: Murmur (3 out of 6 systolic murmur heard at upper and lower sternal border) heard.      Comments: Pacemaker in place for episode of bradycardia years ago Pulmonary:     Effort: Pulmonary effort is normal. No respiratory distress.     Breath sounds: Normal breath sounds.  Abdominal:     General: There is no distension.     Palpations: Abdomen is soft.     Tenderness: There is no abdominal tenderness.  Musculoskeletal:     Cervical back: Normal range of motion.     Right lower leg: No edema.     Left lower leg: No edema.     Comments: Mild swelling near the left elbow, tender to palpation, no erythema  Neurological:     Mental Status: She is alert.  Psychiatric:        Mood and Affect: Mood normal.     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 CTA 8/14 shows stable known intramural thoracic aortic hematoma with stable aneurysm dilation of the ascending thoracic aorta measuring 4 cm in AP diameter.  Planned surgery later this week.  Continue to control hypertension, goal systolic blood pressures < 120.  Unknown cause of this resistant hypertension, no work up found in chart. She sees Dr. Sheryle Hail in [place]? last month. No record of these visits.  -Continue Cleviprex, currently 12, wean as tolerated -Continue labetalol 300 mg p.o. 3 times daily -Continue chlorthalidone 25 mg daily -Continue spironolactone 50 mg daily -Continue clonidine 0.2 mg 3 times  daily -Avoid lisinopril and amlodipine due to history of angioedema -Planned surgery later this week   Superficial thrombophlebitis of left cephalic and basilic vein  Swelling appears to be mild, no erythema noted -Continue ice and pain control with Tylenol   Hx of tobacco use disorder -Smoking cessation   Hypothyroidism: TSH of 0.121 on 8/8. Will lower dose of Synthroid and recheck TSH and T4. --Reduce levothyroxine to 100 mcg p.o. daily   Chronic back pain -Dilaudid 4 mg every 3 hours as needed -Oxycodone 10mg  q4h prn -Continue zoloft   Hyperglycemia 2/2 poorly controlled type II diabetes mellitus: CBG within good range -Continue SSI for goal CBGs <180   Constipation: -Continue MiraLAX -Continue Colace   Best practice:  Diet: carb modified, heart healthy Pain/Anxiety/Delirium protocol (if indicated): oxycodone, Dilaudid VAP protocol (if indicated): none DVT prophylaxis: SCDs GI prophylaxis: pepcid  Glucose control: SSI Mobility: Bed rest  Code Status: FULL  Family Communication: We'll attempt to contact patient's son, Steffanie Dunn.  Disposition: ICU  Critical care times: 20 minutes   Gaylan Gerold, DO Internal Medicine Residency My pager: [phone]

## 2019-09-05 NOTE — Progress Notes (Addendum)
  Progress Note    09/05/2019 7:46 AM * No surgery found *  Subjective:  Mild chest pain no worse than it has been. States that her feet feel fine. Sitting up in bed eating breakfast  Yesterday afternoon events noted. Had sudden onset of central chest pain, increased BP when Cleviprex turned off. Stat CT chest showed stable findings. Cleviprex restarted   Vitals:   09/05/19 0600 09/05/19 0630  BP: 127/68 137/70  Pulse: 68 65  Resp: 15 (!) 21  Temp:    SpO2: 100% 91%   Physical Exam: Cardiac: regular Lungs: non labored Extremities:  2+ radial pulses, 2+ DP pulses bilaterally. Bilateral lower extremities well perfused and warm Abdomen:  Soft, non tender Neurologic:alert and oriented  CBC    Component Value Date/Time   WBC 10.4 09/04/2019 1759   RBC 3.73 (L) 09/04/2019 1759   HGB 9.4 (L) 09/04/2019 1759   HCT 30.4 (L) 09/04/2019 1759   PLT 300 09/04/2019 1759   MCV 81.5 09/04/2019 1759   MCH 25.2 (L) 09/04/2019 1759   MCHC 30.9 09/04/2019 1759   RDW 20.9 (H) 09/04/2019 1759   LYMPHSABS 1.3 05/06/2017 0353   MONOABS 0.3 05/06/2017 0353   EOSABS 0.0 05/06/2017 0353   BASOSABS 0.0 05/06/2017 0353    BMET    Component Value Date/Time   NA 137 09/04/2019 1759   K 3.6 09/04/2019 1759   CL 100 09/04/2019 1759   CO2 29 09/04/2019 1759   GLUCOSE 119 (H) 09/04/2019 1759   BUN 15 09/04/2019 1759   CREATININE 1.02 (H) 09/04/2019 1759   CALCIUM 9.1 09/04/2019 1759   GFRNONAA 56 (L) 09/04/2019 1759   GFRAA >60 09/04/2019 1759    INR    Component Value Date/Time   INR 0.92 04/04/2010 0023     Intake/Output Summary (Last 24 hours) at 09/05/2019 0746 Last data filed at 09/05/2019 0622 Gross per 24 hour  Intake 1398 ml  Output 2200 ml  Net -802 ml     Assessment/Plan:  69 y.o. female  With aortic intramural hematoma currently stable. Repair by Dr. Trula Slade planned for later this week. Minimal chest pain currently. No bilateral feet pain. BP good this morning.  Continue good blood pressure control. Medical management per [place], [place] [facility] [phone] 09/05/2019 7:46 AM    Surgery tentatively scheduled for Friday  [facility]

## 2019-09-05 NOTE — Progress Notes (Signed)
PCCM INTERVAL PROGRESS NOTE   Called to bedside to evaluate for CVL after PICC team was unable to place line (pacemaker on R and restricted extremity on L). Patient has two working IVs although they are marginal at times. She is not fond of the idea of a central line. Left EJ 20 gauge PIV placed. Good blood return and flushes easily.     Georgann Housekeeper, AGACNP-BC [place]  See Amion for personal pager PCCM on call pager [phone]  09/05/2019 9:00 PM

## 2019-09-06 ENCOUNTER — Encounter (HOSPITAL_COMMUNITY): Payer: Medicare Other

## 2019-09-06 DIAGNOSIS — I7101 Dissection of thoracic aorta: Secondary | ICD-10-CM

## 2019-09-06 DIAGNOSIS — I71019 Dissection of thoracic aorta, unspecified: Secondary | ICD-10-CM

## 2019-09-06 DIAGNOSIS — E1159 Type 2 diabetes mellitus with other circulatory complications: Secondary | ICD-10-CM

## 2019-09-06 LAB — CBC
HCT: 29.4 % — ABNORMAL LOW (ref 36.0–46.0)
Hemoglobin: 9.2 g/dL — ABNORMAL LOW (ref 12.0–15.0)
MCH: 25.3 pg — ABNORMAL LOW (ref 26.0–34.0)
MCHC: 31.3 g/dL (ref 30.0–36.0)
MCV: 81 fL (ref 80.0–100.0)
Platelets: 323 10*3/uL (ref 150–400)
RBC: 3.63 MIL/uL — ABNORMAL LOW (ref 3.87–5.11)
RDW: 20.8 % — ABNORMAL HIGH (ref 11.5–15.5)
WBC: 9.4 10*3/uL (ref 4.0–10.5)
nRBC: 0 % (ref 0.0–0.2)

## 2019-09-06 LAB — BASIC METABOLIC PANEL
Anion gap: 10 (ref 5–15)
BUN: 13 mg/dL (ref 8–23)
CO2: 28 mmol/L (ref 22–32)
Calcium: 9.3 mg/dL (ref 8.9–10.3)
Chloride: 100 mmol/L (ref 98–111)
Creatinine, Ser: 1.08 mg/dL — ABNORMAL HIGH (ref 0.44–1.00)
GFR calc Af Amer: >60 mL/min (ref >60)
GFR calc non Af Amer: 52 mL/min — ABNORMAL LOW (ref >60)
Glucose, Bld: 127 mg/dL — ABNORMAL HIGH (ref 70–99)
Potassium: 3.9 mmol/L (ref 3.5–5.1)
Sodium: 138 mmol/L (ref 135–145)

## 2019-09-06 LAB — GLUCOSE, CAPILLARY
Glucose-Capillary: 121 mg/dL — ABNORMAL HIGH (ref 70–99)
Glucose-Capillary: 136 mg/dL — ABNORMAL HIGH (ref 70–99)
Glucose-Capillary: 116 mg/dL — ABNORMAL HIGH (ref 70–99)
Glucose-Capillary: 147 mg/dL — ABNORMAL HIGH (ref 70–99)

## 2019-09-06 LAB — TRIGLYCERIDES: Triglycerides: 44 mg/dL (ref <150)

## 2019-09-06 MED ORDER — LABETALOL HCL 200 MG PO TABS
400.0000 mg | ORAL_TABLET | Freq: Three times a day (TID) | ORAL | Status: DC
  Administered 2019-09-06 – 2019-09-10 (×11): 400 mg via ORAL
  Filled 2019-09-06 (×14): qty 2

## 2019-09-06 NOTE — Progress Notes (Addendum)
NAME:  [name], MRN:  [id number], DOB:  1950/05/29, LOS: 9 ADMISSION DATE:  08/28/2019, CONSULTATION DATE:  08/28/2019 REFERRING MD:  ED Provider, CHIEF COMPLAINT:  Abdominal pain 2/2 descending aortic aneurysm    Brief History   69 year old female with PMHx of hypertension, tobacco use disorder, atrial fibrillation, hypothyroidism presented to [facility] with abdominal pain for 2 days. She was found to have a descending thoracic aortic aneurysm and was transferred to [facility] for further management.   Past Medical History   Past Medical History:  Diagnosis Date  . Anemia   . Anxiety   . Arthritis   . Atrial fib/flutter, transient   . COPD (chronic obstructive pulmonary disease) ([place])   . Depression   . Diabetes mellitus   . Diverticulosis   . DJD (degenerative joint disease)   . GERD (gastroesophageal reflux disease)   . Gout   . History of pneumonia   . Hypertension   . Hypothyroidism   . Internal hemorrhoids    Significant Hospital Events   8/8 transferred from [facility]  Consults:  Vascular surgery  Procedures:  None  Significant Diagnostic Tests:   CT angio chest/abd/pel 8/08 >> acute appearing intramural hematoma of distal aortic arch and descending thoracic aorta originating at or distal to Lt [place] artery measuring 3.7 x 3.5 cm and extends to level of Rt renal artery mild centrilobular emphysema, scarring at lung bases  Echo 8/9 >> LVEF 65-70% without RWMA; grade I diastolic dyusfunction; trivial mitral regurgitation   CT angio chest/abd/pel 8/11 >> stable appearance of acute intramural hematoma arising distal to the takeoff of the left subclavian artery and terminating at the level of the proximal abdominal aorta; previously demonstrated possible penetrating ulcer arising from the descending thoracic aorta no longer well appreciated and likely resolved in the interim; bilateral pleural effusions with mild interlobular septal thickening suggestive of volume overload  LUE  Vascular US 8/13 >> No DVT. Acute superficial vein thrombosis of L basilic vein and L cephalic vein; acute occlusive superficial thrombophlebitis in mid basilic vein and cephalic vein   CTA chest 8/14 >> stable known intramural thoracic aortic hematoma with stable aneurysm dilation of the ascending thoracic aorta measuring 4 cm in AP diameter.  Slight worsening of small simple left pleural effusion with associated by basilar atelectasis.  Slightly nodular component to some of this posterior dependent density over the mid lung as cannot exclude superimposed infection   Micro Data:  COVID 8/8 > negative MRSA 8/8 > negative  Antimicrobials:  None   Interim history/subjective:  No acute event overnight.  IR was consulted for PICC line placed.  Unable to place PICC line given pacemaker on the right with restricted extremity on the left.  A left EJ 20-gauge PIV was placed.  Patient might ultimately need a central line for the surgery.  Patient is seen at bedside.  She is pleasant and in no acute distress.  She denies chest pains or shortness of breath or headache today.  She still complaining of left arm pain.  Plan for surgery on Friday.  Objective   Blood pressure 118/72, pulse 83, temperature 98.2 F (36.8 C), temperature source Oral, resp. rate (!) 22, height 5' (1.524 m), weight 66.3 kg, SpO2 97 %.        Intake/Output Summary (Last 24 hours) at 09/06/2019 0741 Last data filed at 09/06/2019 0700 Gross per 24 hour  Intake 613.76 ml  Output 600 ml  Net 13.76 ml  Filed Weights   09/04/19 0113 09/05/19 0357 09/06/19 0437  Weight: 66.8 kg 66.7 kg 66.3 kg    Physical Exam Constitutional:      General: She is not in acute distress.    Appearance: She is not toxic-appearing.  Eyes:     General: No scleral icterus.       Right eye: No discharge.     Conjunctiva/sclera: Conjunctivae normal.  Cardiovascular:     Rate and Rhythm: Normal rate and regular rhythm.     Heart sounds:  Murmur (3/6 systolic murmur noted at upper sternal border) heard.   Pulmonary:     Effort: Pulmonary effort is normal. No respiratory distress.     Breath sounds: Normal breath sounds.  Abdominal:     General: There is no distension.     Palpations: Abdomen is soft.     Tenderness: There is no abdominal tenderness.  Musculoskeletal:     Cervical back: Normal range of motion.     Right lower leg: No edema.     Left lower leg: No edema.  Skin:    General: Skin is warm.     Coloration: Skin is not jaundiced.  Neurological:     General: No focal deficit present.     Mental Status: She is alert.  Psychiatric:        Mood and Affect: Mood normal.     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 CTA 8/14 shows stable known intramural thoracic aortic hematoma with stable aneurysm dilation of the ascending thoracic aorta measuring 4 cm in AP diameter.  Planned surgery on Friday.  Continue to control hypertension, goal systolic blood pressures < 120.  Consider outpatient eval of resistant hypertension.  Aldosterone/renal ratio may not be helpful at the moment because patient is already on spironolactone. -Continue Cleviprex, currently 8, wean as tolerated  -Add nifedipine 60 mg daily -Increase labetalol 400 mg p.o. 3 times daily -Continue hydralazine 100 mg p.o. 3 times daily daily -Continue chlorthalidone 25 mg daily -Continue spironolactone 50 mg daily -Continue clonidine 0.2 mg 3 times daily -Avoid lisinopril and amlodipine due to history of  angioedema -Planned surgery Friday 8/20 -Unsuccessful attempt to place PICC line.  Patient currently has 3 IVs.  She may need an central line placed with the surgery.   Hypothyroidism TSH of 0.121 on 8/8.  Repeat TSH of 0.11 and free T4 of 2.04.  Will DC levothyroxine.  This may help with her blood pressure. -Continue holding levothyroxine -Will restart at a lower dose after surgery   Superficial thrombophlebitis of left cephalic and basilic vein  Swelling appears to be mild, no erythema noted -Continue ice and pain control with Tylenol   Hx of tobacco use disorder -Smoking cessation   Chronic back pain -Dilaudid 4 mg every 3 hours as needed -Oxycodone 10mg  q4h prn -Continue zoloft   Hyperglycemia 2/2 poorly controlled type II diabetes mellitus: CBG within good range -Continue SSI for goal CBGs <180   Constipation: -Continue MiraLAX -Continue Colace   Best practice:  Diet: carb modified, heart healthy Pain/Anxiety/Delirium protocol (if indicated): oxycodone, Dilaudid VAP protocol (if indicated): none DVT prophylaxis:  SCDs GI prophylaxis: pepcid  Glucose control: SSI Mobility: Bed rest  Code Status: FULL  Family Communication: Update son  Disposition: ICU  Critical care times: 20 minutes   Gaylan Gerold, DO Internal Medicine Residency My pager: [phone]

## 2019-09-06 NOTE — Progress Notes (Signed)
    Subjective  -   Frustrated over  IV access and blood draws   Physical Exam:  abd soft Palpable pedal pulses Neuro intact       Assessment/Plan:    Plan for TEVAR with iliac conduit on Friday Will discuss with son later today Continue BP control.  Hopefully can wean off IV meds  Wells Meeah Totino 09/06/2019 7:43 AM --  Vitals:   09/06/19 0700 09/06/19 0706  BP:  118/72  Pulse: 75 83  Resp: (!) 25 (!) 22  Temp:    SpO2: 93% 97%    Intake/Output Summary (Last 24 hours) at 09/06/2019 0743 Last data filed at 09/06/2019 0700 Gross per 24 hour  Intake 613.76 ml  Output 600 ml  Net 13.76 ml     Laboratory CBC    Component Value Date/Time   WBC 10.6 (H) 09/05/2019 0931   HGB 9.8 (L) 09/05/2019 0931   HCT 31.6 (L) 09/05/2019 0931   PLT 291 09/05/2019 0931    BMET    Component Value Date/Time   NA 136 09/05/2019 0931   K 3.7 09/05/2019 0931   CL 100 09/05/2019 0931   CO2 26 09/05/2019 0931   GLUCOSE 162 (H) 09/05/2019 0931   BUN 12 09/05/2019 0931   CREATININE 1.03 (H) 09/05/2019 0931   CALCIUM 9.4 09/05/2019 0931   GFRNONAA 55 (L) 09/05/2019 0931   GFRAA >60 09/05/2019 0931    COAG Lab Results  Component Value Date   INR 0.92 04/04/2010   INR 1.0 08/26/2007   INR 1.0 RATIO 05/19/2007   No results found for: PTT  Antibiotics Anti-infectives (From admission, onward)   None       V. Leia Alf, M.D., [facility] [place] Office: [phone] Pager:  [phone]

## 2019-09-07 ENCOUNTER — Inpatient Hospital Stay (HOSPITAL_COMMUNITY): Payer: Medicare Other

## 2019-09-07 DIAGNOSIS — R0902 Hypoxemia: Secondary | ICD-10-CM

## 2019-09-07 LAB — GLUCOSE, CAPILLARY
Glucose-Capillary: 134 mg/dL — ABNORMAL HIGH (ref 70–99)
Glucose-Capillary: 125 mg/dL — ABNORMAL HIGH (ref 70–99)
Glucose-Capillary: 120 mg/dL — ABNORMAL HIGH (ref 70–99)
Glucose-Capillary: 134 mg/dL — ABNORMAL HIGH (ref 70–99)

## 2019-09-07 LAB — CBC
HCT: 29.2 % — ABNORMAL LOW (ref 36.0–46.0)
Hemoglobin: 9.4 g/dL — ABNORMAL LOW (ref 12.0–15.0)
MCH: 25.5 pg — ABNORMAL LOW (ref 26.0–34.0)
MCHC: 32.2 g/dL (ref 30.0–36.0)
MCV: 79.1 fL — ABNORMAL LOW (ref 80.0–100.0)
Platelets: 302 10*3/uL (ref 150–400)
RBC: 3.69 MIL/uL — ABNORMAL LOW (ref 3.87–5.11)
RDW: 20.4 % — ABNORMAL HIGH (ref 11.5–15.5)
WBC: 11.2 10*3/uL — ABNORMAL HIGH (ref 4.0–10.5)
nRBC: 0 % (ref 0.0–0.2)

## 2019-09-07 LAB — BASIC METABOLIC PANEL
Anion gap: 9 (ref 5–15)
BUN: 16 mg/dL (ref 8–23)
CO2: 27 mmol/L (ref 22–32)
Calcium: 9.2 mg/dL (ref 8.9–10.3)
Chloride: 99 mmol/L (ref 98–111)
Creatinine, Ser: 1.22 mg/dL — ABNORMAL HIGH (ref 0.44–1.00)
GFR calc Af Amer: 52 mL/min — ABNORMAL LOW (ref >60)
GFR calc non Af Amer: 45 mL/min — ABNORMAL LOW (ref >60)
Glucose, Bld: 151 mg/dL — ABNORMAL HIGH (ref 70–99)
Potassium: 3.8 mmol/L (ref 3.5–5.1)
Sodium: 135 mmol/L (ref 135–145)

## 2019-09-07 MED ORDER — HEPARIN SODIUM (PORCINE) 5000 UNIT/ML IJ SOLN: 5000.0000 [IU] | Freq: Three times a day (TID) | INTRAMUSCULAR | Status: DC

## 2019-09-07 MED ORDER — RAMELTEON 8 MG PO TABS: 8.0000 mg | ORAL_TABLET | Freq: Every day | ORAL | Status: DC

## 2019-09-07 MED ORDER — MELATONIN 3 MG PO TABS
9.0000 mg | ORAL_TABLET | Freq: Every day | ORAL | Status: DC
  Administered 2019-09-07 – 2019-09-18 (×11): 9 mg via ORAL
  Filled 2019-09-07 (×11): qty 3

## 2019-09-07 MED ORDER — NIFEDIPINE ER OSMOTIC RELEASE 90 MG PO TB24
90.0000 mg | ORAL_TABLET | Freq: Every day | ORAL | Status: DC
  Administered 2019-09-07 – 2019-09-11 (×4): 90 mg via ORAL
  Filled 2019-09-07 (×6): qty 1

## 2019-09-07 NOTE — Progress Notes (Addendum)
  Progress Note    09/07/2019 9:06 AM * No surgery date entered *  Subjective:  No chest or back pain this morning   Vitals:   09/07/19 0718 09/07/19 0800  BP:  104/69  Pulse:  80  Resp:  17  Temp: 99.1 F (37.3 C)   SpO2:  93%   Physical Exam: Lungs:  Non labored Extremities:  Symmetrical radial pulses; 1+ R DP, 2+ L DP Abdomen:  Soft, NT, ND Neurologic: A&O  CBC    Component Value Date/Time   WBC 11.2 (H) 09/07/2019 0111   RBC 3.69 (L) 09/07/2019 0111   HGB 9.4 (L) 09/07/2019 0111   HCT 29.2 (L) 09/07/2019 0111   PLT 302 09/07/2019 0111   MCV 79.1 (L) 09/07/2019 0111   MCH 25.5 (L) 09/07/2019 0111   MCHC 32.2 09/07/2019 0111   RDW 20.4 (H) 09/07/2019 0111   LYMPHSABS 1.3 05/06/2017 0353   MONOABS 0.3 05/06/2017 0353   EOSABS 0.0 05/06/2017 0353   BASOSABS 0.0 05/06/2017 0353    BMET    Component Value Date/Time   NA 135 09/07/2019 0111   K 3.8 09/07/2019 0111   CL 99 09/07/2019 0111   CO2 27 09/07/2019 0111   GLUCOSE 151 (H) 09/07/2019 0111   BUN 16 09/07/2019 0111   CREATININE 1.22 (H) 09/07/2019 0111   CALCIUM 9.2 09/07/2019 0111   GFRNONAA 45 (L) 09/07/2019 0111   GFRAA 52 (L) 09/07/2019 0111    INR    Component Value Date/Time   INR 0.92 04/04/2010 0023     Intake/Output Summary (Last 24 hours) at 09/07/2019 0906 Last data filed at 09/07/2019 0518 Gross per 24 hour  Intake 468.22 ml  Output 1500 ml  Net -1031.78 ml     Assessment/Plan:  69 y.o. female with aortic intramural hematoma  No signs or symptoms of malperfusion Plan is for TEVAR with iliac conduit on Friday 8/20 with Dr. Trula Slade Continue BP control   Dagoberto Ligas, PA-C [facility] [phone] 09/07/2019 9:06 AM  I agree with the above.  I again discussed details of surgery as well as risks and benefits.  I am planning on Friday am.  [name]

## 2019-09-07 NOTE — Progress Notes (Signed)
eLink Physician-Brief Progress Note Patient Name: [name] DOB: August 04, 1950 MRN: [id number]   Date of Service  09/07/2019  HPI/Events of Note  Pacemaker is not capturing .  eICU Interventions  EP consultation.        Kerry Kass Steffon Gladu 09/07/2019, 8:23 PM

## 2019-09-07 NOTE — Progress Notes (Signed)
NAME:  Lulie Hurd, MRN:  [id number], DOB:  [birth year], LOS: 87 ADMISSION DATE:  08/28/2019, CONSULTATION DATE:  08/28/2019 REFERRING MD:  ED Provider, CHIEF COMPLAINT:  Abdominal pain 2/2 descending aortic aneurysm    Brief History   69 year old female with PMHx of hypertension, tobacco use disorder, atrial fibrillation, hypothyroidism presented to [facility] with abdominal pain for 2 days. She was found to have a descending thoracic aortic aneurysm and was transferred to [facility] for further management.   Past Medical History   Past Medical History:  Diagnosis Date  . Anemia   . Anxiety   . Arthritis   . Atrial fib/flutter, transient   . COPD (chronic obstructive pulmonary disease) ([place])   . Depression   . Diabetes mellitus   . Diverticulosis   . DJD (degenerative joint disease)   . GERD (gastroesophageal reflux disease)   . Gout   . History of pneumonia   . Hypertension   . Hypothyroidism   . Internal hemorrhoids    Significant Hospital Events   8/8 transferred from [facility]  Consults:  Vascular surgery  Procedures:  None  Significant Diagnostic Tests:   CT angio chest/abd/pel 8/08 >> acute appearing intramural hematoma of distal aortic arch and descending thoracic aorta originating at or distal to Lt [place] artery measuring 3.7 x 3.5 cm and extends to level of Rt renal artery mild centrilobular emphysema, scarring at lung bases  Echo 8/9 >> LVEF 65-70% without RWMA; grade I diastolic dyusfunction; trivial mitral regurgitation   CT angio chest/abd/pel 8/11 >> stable appearance of acute intramural hematoma arising distal to the takeoff of the left subclavian artery and terminating at the level of the proximal abdominal aorta; previously demonstrated possible penetrating ulcer arising from the descending thoracic aorta no longer well appreciated and likely resolved in the interim; bilateral pleural effusions with mild interlobular septal thickening suggestive of volume overload  LUE  Vascular US 8/13 >> No DVT. Acute superficial vein thrombosis of L basilic vein and L cephalic vein; acute occlusive superficial thrombophlebitis in mid basilic vein and cephalic vein   CTA chest 8/14 >> stable known intramural thoracic aortic hematoma with stable aneurysm dilation of the ascending thoracic aorta measuring 4 cm in AP diameter.  Slight worsening of small simple left pleural effusion with associated by basilar atelectasis.  Slightly nodular component to some of this posterior dependent density over the mid lung as cannot exclude superimposed infection   Micro Data:  COVID 8/8 > negative MRSA 8/8 > negative  Antimicrobials:  None   Interim history/subjective:  No acute event overnight.  Patient is seen at bedside.  She states that she did not have a good night sleep which contribute to her elevated blood pressure last night.  She denies chest pain, headache, or shortness of breath.  She complains of pain of left posterior ribs which were resolved with massaging. Patient states that she quit smoking 52 years ago.  She did not think that she was diagnosed with COPD.  She reportedly does not use oxygen at home  Objective   Blood pressure 104/69, pulse 80, temperature 99.1 F (37.3 C), temperature source Oral, resp. rate 17, height 5' (1.524 m), weight 54.1 kg, SpO2 93 %.        Intake/Output Summary (Last 24 hours) at 09/07/2019 0831 Last data filed at 09/07/2019 0518 Gross per 24 hour  Intake 481.96 ml  Output 1500 ml  Net -1018.04 ml   Filed Weights   09/05/19 0357  09/06/19 0437 09/07/19 0500  Weight: 66.7 kg 66.3 kg 54.1 kg    Physical Exam Constitutional:      General: She is not in acute distress.    Appearance: She is not toxic-appearing.  Eyes:     General: No scleral icterus.       Right eye: No discharge.     Conjunctiva/sclera: Conjunctivae normal.  Cardiovascular:     Rate and Rhythm: Normal rate and regular rhythm.     Heart sounds: Murmur (3/6  systolic murmur noted at upper sternal border) heard.   Pulmonary:     Effort: Pulmonary effort is normal. No respiratory distress.     Comments: Mild diminished breath sounds at bilateral lung base Abdominal:     General: There is no distension.     Palpations: Abdomen is soft.     Tenderness: There is no abdominal tenderness.  Musculoskeletal:     Cervical back: Normal range of motion.     Right lower leg: No edema.     Left lower leg: No edema.  Skin:    General: Skin is warm.     Coloration: Skin is not jaundiced.  Neurological:     General: No focal deficit present.     Mental Status: She is alert.  Psychiatric:        Mood and Affect: Mood normal.     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 CTA 8/14 shows stable known intramural thoracic aortic hematoma with stable aneurysm dilation of the ascending thoracic aorta measuring 4 cm in AP diameter.  Planned surgery on Friday.  Continue to control hypertension, goal systolic blood pressures < 120.  Consider outpatient eval of resistant hypertension.  Aldosterone/renal ratio may not be helpful at the moment because patient is already on spironolactone. -Continue Cleviprex, currently 14, wean as tolerated  -Increase nifedipine 90 mg daily -Continue labetalol 400 mg p.o. 3 times daily -Continue hydralazine 100 mg p.o. 3 times daily daily -Continue chlorthalidone 25 mg daily -Continue spironolactone 50  mg daily -Continue clonidine 0.2 mg 3 times daily -Avoid lisinopril and amlodipine due to history of angioedema -Planned surgery Friday 8/20   Acute hypoxic respiratory failure requiring nasal cannula Patient is satting at 93% on 5 L nasal cannula.  Attempt to wean down to 4 L but her sat dropped in the 86%.  We will continue 5 L of nasal cannula.  Chest x-ray shows emphysematous change likely COPD related. -Continue 5 L nasal cannula.  Wean as tolerated   Elevated creatinine Baseline creatinine of 0.9.  Creatinine elevated 1.03 - 1.08 - 1.22.  Patient reports good urine output.  We will check urine sodium and creatinine to calculate FeNa.  -Continue trending creatinine -BMP in the morning   Hypothyroidism TSH of 0.121 on 8/8.  Repeat TSH of 0.11 and free T4 of 2.04.  Will DC levothyroxine.  This may help with her blood pressure. -Continue holding levothyroxine -Will restart at a lower dose after surgery   Superficial thrombophlebitis of left cephalic and basilic vein  Swelling appears to be mild, no erythema noted -  Continue ice and pain control with Tylenol   Chronic back pain -Dilaudid 4 mg every 3 hours as needed -Oxycodone 10mg  hypoxic prn -Continue zoloft   Hyperglycemia 2/2 poorly controlled type II diabetes mellitus: CBG within good range -Continue SSI for goal CBGs <180   Constipation: -Continue MiraLAX -Continue Colace   Best practice:  Diet: carb modified, heart healthy Pain/Anxiety/Delirium protocol (if indicated): oxycodone, Dilaudid VAP protocol (if indicated): none DVT prophylaxis: SCDs GI prophylaxis: pepcid  Glucose control: SSI Mobility: As tolerated Code Status: FULL  Family Communication: Update son  Disposition: ICU for Cleviprex drip  Critical care times: 20 minutes   Gaylan Gerold, DO Internal Medicine Residency My pager: [phone]

## 2019-09-08 LAB — BASIC METABOLIC PANEL
Anion gap: 10 (ref 5–15)
BUN: 16 mg/dL (ref 8–23)
CO2: 28 mmol/L (ref 22–32)
Calcium: 9 mg/dL (ref 8.9–10.3)
Chloride: 99 mmol/L (ref 98–111)
Creatinine, Ser: 1.18 mg/dL — ABNORMAL HIGH (ref 0.44–1.00)
GFR calc Af Amer: 54 mL/min — ABNORMAL LOW (ref >60)
GFR calc non Af Amer: 47 mL/min — ABNORMAL LOW (ref >60)
Glucose, Bld: 140 mg/dL — ABNORMAL HIGH (ref 70–99)
Potassium: 4.2 mmol/L (ref 3.5–5.1)
Sodium: 137 mmol/L (ref 135–145)

## 2019-09-08 LAB — GLUCOSE, CAPILLARY
Glucose-Capillary: 170 mg/dL — ABNORMAL HIGH (ref 70–99)
Glucose-Capillary: 168 mg/dL — ABNORMAL HIGH (ref 70–99)
Glucose-Capillary: 121 mg/dL — ABNORMAL HIGH (ref 70–99)
Glucose-Capillary: 117 mg/dL — ABNORMAL HIGH (ref 70–99)

## 2019-09-08 LAB — CBC
HCT: 27.8 % — ABNORMAL LOW (ref 36.0–46.0)
Hemoglobin: 8.7 g/dL — ABNORMAL LOW (ref 12.0–15.0)
MCH: 25.3 pg — ABNORMAL LOW (ref 26.0–34.0)
MCHC: 31.3 g/dL (ref 30.0–36.0)
MCV: 80.8 fL (ref 80.0–100.0)
Platelets: 327 10*3/uL (ref 150–400)
RBC: 3.44 MIL/uL — ABNORMAL LOW (ref 3.87–5.11)
RDW: 21.5 % — ABNORMAL HIGH (ref 11.5–15.5)
WBC: 10.4 10*3/uL (ref 4.0–10.5)
nRBC: 0 % (ref 0.0–0.2)

## 2019-09-08 MED ORDER — MINOXIDIL 2.5 MG PO TABS
5.0000 mg | ORAL_TABLET | Freq: Every day | ORAL | Status: DC
  Administered 2019-09-08: 5 mg via ORAL
  Filled 2019-09-08 (×2): qty 2

## 2019-09-08 MED ORDER — VANCOMYCIN HCL IN DEXTROSE 1-5 GM/200ML-% IV SOLN
1000.0000 mg | INTRAVENOUS | Status: AC
  Filled 2019-09-08 (×2): qty 200

## 2019-09-08 MED ORDER — DIPHENHYDRAMINE HCL 25 MG PO CAPS
25.0000 mg | ORAL_CAPSULE | Freq: Four times a day (QID) | ORAL | Status: DC | PRN
  Administered 2019-09-08 – 2019-09-14 (×3): 25 mg via ORAL
  Filled 2019-09-08 (×3): qty 1

## 2019-09-08 NOTE — Progress Notes (Signed)
NAME:  [name], MRN:  [id number], DOB:  1951-01-14, LOS: 3 ADMISSION DATE:  08/28/2019, CONSULTATION DATE:  08/28/2019 REFERRING MD:  ED Provider, CHIEF COMPLAINT:  Abdominal pain 2/2 descending aortic aneurysm    Brief History   69 year old female with PMHx of hypertension, tobacco use disorder, atrial fibrillation, hypothyroidism presented to [facility] with abdominal pain for 2 days. She was found to have a descending thoracic aortic aneurysm and was transferred to [facility] for further management.   Past Medical History   Past Medical History:  Diagnosis Date  . Anemia   . Anxiety   . Arthritis   . Atrial fib/flutter, transient   . COPD (chronic obstructive pulmonary disease) ([place])   . Depression   . Diabetes mellitus   . Diverticulosis   . DJD (degenerative joint disease)   . GERD (gastroesophageal reflux disease)   . Gout   . History of pneumonia   . Hypertension   . Hypothyroidism   . Internal hemorrhoids    Significant Hospital Events   8/8 transferred from [facility]  Consults:  Vascular surgery  Procedures:  None  Significant Diagnostic Tests:   CT angio chest/abd/pel 8/08 >> acute appearing intramural hematoma of distal aortic arch and descending thoracic aorta originating at or distal to Lt [place] artery measuring 3.7 x 3.5 cm and extends to level of Rt renal artery mild centrilobular emphysema, scarring at lung bases  Echo 8/9 >> LVEF 65-70% without RWMA; grade I diastolic dyusfunction; trivial mitral regurgitation   CT angio chest/abd/pel 8/11 >> stable appearance of acute intramural hematoma arising distal to the takeoff of the left subclavian artery and terminating at the level of the proximal abdominal aorta; previously demonstrated possible penetrating ulcer arising from the descending thoracic aorta no longer well appreciated and likely resolved in the interim; bilateral pleural effusions with mild interlobular septal thickening suggestive of volume overload  LUE  Vascular US 8/13 >> No DVT. Acute superficial vein thrombosis of L basilic vein and L cephalic vein; acute occlusive superficial thrombophlebitis in mid basilic vein and cephalic vein   CTA chest 8/14 >> stable known intramural thoracic aortic hematoma with stable aneurysm dilation of the ascending thoracic aorta measuring 4 cm in AP diameter.  Slight worsening of small simple left pleural effusion with associated by basilar atelectasis.  Slightly nodular component to some of this posterior dependent density over the mid lung as cannot exclude superimposed infection   Micro Data:  COVID 8/8 > negative MRSA 8/8 > negative  Antimicrobials:  None   Interim history/subjective:  No acute event overnight.  Patient is seen at bedside.  Patient states that she is doing well.  She states that she did not had a good night sleep last night.  She denies chest pain, shortness of breath or headache.  Plan surgery on Friday.  Objective   Blood pressure (!) 156/64, pulse 69, temperature 98.3 F (36.8 C), temperature source Oral, resp. rate (!) 22, height 5' (1.524 m), weight 54.9 kg, SpO2 90 %.        Intake/Output Summary (Last 24 hours) at 09/08/2019 0839 Last data filed at 09/08/2019 0435 Gross per 24 hour  Intake 1626.76 ml  Output 725 ml  Net 901.76 ml   Filed Weights   09/06/19 0437 09/07/19 0500 09/08/19 0500  Weight: 66.3 kg 54.1 kg 54.9 kg    Physical Exam Constitutional:      General: She is not in acute distress.    Appearance: She  is not toxic-appearing.  Eyes:     General: No scleral icterus.       Right eye: No discharge.     Conjunctiva/sclera: Conjunctivae normal.  Cardiovascular:     Rate and Rhythm: Normal rate and regular rhythm.     Heart sounds: Murmur (3/6 systolic murmur noted at upper sternal border) heard.   Pulmonary:     Effort: Pulmonary effort is normal. No respiratory distress.  Abdominal:     General: Bowel sounds are normal.  Musculoskeletal:      Cervical back: Normal range of motion.     Right lower leg: No edema.     Left lower leg: No edema.  Skin:    General: Skin is warm.     Coloration: Skin is not jaundiced.  Neurological:     General: No focal deficit present.     Mental Status: She is alert.  Psychiatric:        Mood and Affect: Mood normal.     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 CTA 8/14 shows stable known intramural thoracic aortic hematoma with stable aneurysm dilation of the ascending thoracic aorta measuring 4 cm in AP diameter.  Planned surgery on Friday.  Continue to control hypertension, goal systolic blood pressures < 120.  Consider outpatient eval of resistant hypertension.  -Continue Cleviprex, currently 15, wean as tolerated  -Added minoxidil 5 mg daily -Continue nifedipine 90 mg daily -Continue labetalol 400 mg p.o. 3 times daily -Continue hydralazine 100 mg p.o. 3 times daily daily -Continue chlorthalidone 25 mg daily -Continue spironolactone 50 mg daily -Continue clonidine 0.2 mg 3 times daily -Avoid lisinopril and amlodipine due to history of angioedema -Planned surgery Friday 8/20   Acute hypoxic respiratory failure requiring nasal cannula Patient is satting at 93% on 5 L nasal cannula.  Attempt to wean down to 4 L but her sat dropped in the 86%.  We will continue 5 L of nasal cannula.  Chest x-ray shows emphysematous change likely COPD related given history of  smoking. -Continue 5 L nasal cannula.  Wean as tolerated   Elevated creatinine Baseline creatinine of 0.9.  Creatinine trending down 1.03 - 1.08 - 1.22 - 1.18.  Patient reports good urine output.  We will check urine sodium and creatinine to calculate FeNa.  Encourage more p.o. fluid intake. -Continue trending creatinine -BMP in the morning   Hypothyroidism TSH of 0.121 on 8/8.  Repeat TSH of 0.11 and free T4 of 2.04.  Will DC levothyroxine.  This may help with her blood pressure. -Continue holding levothyroxine -Will restart at a lower dose after surgery   Superficial thrombophlebitis of left cephalic and basilic vein  Swelling appears to be mild, no erythema noted -Continue ice and pain control with Tylenol   Chronic back pain -Dilaudid 4 mg every 3 hours as needed -Oxycodone 10mg  hypoxic prn -Continue zoloft   Hyperglycemia 2/2 poorly controlled type II diabetes mellitus: CBG within good range -Continue SSI for goal CBGs <180   Constipation: -Continue MiraLAX -Continue Colace   Best practice:  Diet: carb modified, heart healthy  Pain/Anxiety/Delirium protocol (if indicated): oxycodone, Dilaudid VAP protocol (if indicated): none DVT prophylaxis: SCDs GI prophylaxis: pepcid  Glucose control: SSI Mobility: As tolerated Code Status: FULL  Family Communication: Update son  Disposition: ICU for Cleviprex drip  Critical care times: 20 minutes   Gaylan Gerold, DO Internal Medicine Residency My pager: [phone]

## 2019-09-08 NOTE — H&P (View-Only) (Signed)
  Progress Note    09/08/2019 7:27 AM * No surgery date entered *  Subjective: No complaints of pain currently. Sitting up in bed eating breakfast and conversing.  She ambulated around the unit yesterday without problems.  Had some fleeting anterior left chest pain last night that she said felt like gas pressure and resolved within minutes without pain med.   Vitals:   09/08/19 0600 09/08/19 0615  BP: (!) 156/64   Pulse: 71 69  Resp: (!) 21 (!) 22  Temp:    SpO2: 94% 90%    Physical Exam: Cardiac:  RRR Lungs:  CTAB Extremities:  Warm and well perfused. Radial pulses 2+ bilaterally. 2+ left DP pulse; unable to palpate right DP. Sensation and motor intact Abdomen:  Soft, ND, NT  CBC    Component Value Date/Time   WBC 10.4 09/08/2019 0110   RBC 3.44 (L) 09/08/2019 0110   HGB 8.7 (L) 09/08/2019 0110   HCT 27.8 (L) 09/08/2019 0110   PLT 327 09/08/2019 0110   MCV 80.8 09/08/2019 0110   MCH 25.3 (L) 09/08/2019 0110   MCHC 31.3 09/08/2019 0110   RDW 21.5 (H) 09/08/2019 0110   LYMPHSABS 1.3 05/06/2017 0353   MONOABS 0.3 05/06/2017 0353   EOSABS 0.0 05/06/2017 0353   BASOSABS 0.0 05/06/2017 0353    BMET    Component Value Date/Time   NA 137 09/08/2019 0110   K 4.2 09/08/2019 0110   CL 99 09/08/2019 0110   CO2 28 09/08/2019 0110   GLUCOSE 140 (H) 09/08/2019 0110   BUN 16 09/08/2019 0110   CREATININE 1.18 (H) 09/08/2019 0110   CALCIUM 9.0 09/08/2019 0110   GFRNONAA 47 (L) 09/08/2019 0110   GFRAA 54 (L) 09/08/2019 0110     Intake/Output Summary (Last 24 hours) at 09/08/2019 0727 Last data filed at 09/08/2019 0435 Gross per 24 hour  Intake 1653.53 ml  Output 725 ml  Net 928.53 ml    HOSPITAL MEDICATIONS Scheduled Meds: . Chlorhexidine Gluconate Cloth  6 each Topical Daily  . chlorthalidone  25 mg Oral Daily  . cholecalciferol  1,000 Units Oral q morning - 10a  . cloNIDine  0.2 mg Oral TID  . famotidine  20 mg Oral Daily  . hydrALAZINE  100 mg Oral Q8H  .  insulin aspart  0-15 Units Subcutaneous TID WC  . insulin aspart  0-5 Units Subcutaneous QHS  . labetalol  400 mg Oral TID  . melatonin  9 mg Oral QHS  . nicotine  7 mg Transdermal Daily  . NIFEdipine  90 mg Oral Daily  . polyethylene glycol  17 g Oral BID  . pravastatin  20 mg Oral q1800  . sertraline  50 mg Oral QHS  . spironolactone  50 mg Oral Daily   Continuous Infusions: . sodium chloride Stopped (08/30/19 0233)  . clevidipine 13 mg/hr (09/08/19 0613)   PRN Meds:.sodium chloride, acetaminophen, alum & mag hydroxide-simeth, diphenhydrAMINE, docusate sodium, HYDROmorphone (DILAUDID) injection, lidocaine, ondansetron, oxyCODONE, pneumococcal 23 valent vaccine  Assessment:  69 y.o. female with aortic intramural hematoma continues to require IV antihypertensives and increasing oral agents. Good UOP. Scr = 1.18  Plan: -Continue medical management and planning TEVAR with iliac artery conduit tomorrow.  -DVT prophylaxis:  SCDs   Risa Grill, PA-C [facility] [phone] 09/08/2019  7:27 AM

## 2019-09-08 NOTE — Progress Notes (Addendum)
  Progress Note    09/08/2019 7:27 AM * No surgery date entered *  Subjective: No complaints of pain currently. Sitting up in bed eating breakfast and conversing.  She ambulated around the unit yesterday without problems.  Had some fleeting anterior left chest pain last night that she said felt like gas pressure and resolved within minutes without pain med.   Vitals:   09/08/19 0600 09/08/19 0615  BP: (!) 156/64   Pulse: 71 69  Resp: (!) 21 (!) 22  Temp:    SpO2: 94% 90%    Physical Exam: Cardiac:  RRR Lungs:  CTAB Extremities:  Warm and well perfused. Radial pulses 2+ bilaterally. 2+ left DP pulse; unable to palpate right DP. Sensation and motor intact Abdomen:  Soft, ND, NT  CBC    Component Value Date/Time   WBC 10.4 09/08/2019 0110   RBC 3.44 (L) 09/08/2019 0110   HGB 8.7 (L) 09/08/2019 0110   HCT 27.8 (L) 09/08/2019 0110   PLT 327 09/08/2019 0110   MCV 80.8 09/08/2019 0110   MCH 25.3 (L) 09/08/2019 0110   MCHC 31.3 09/08/2019 0110   RDW 21.5 (H) 09/08/2019 0110   LYMPHSABS 1.3 05/06/2017 0353   MONOABS 0.3 05/06/2017 0353   EOSABS 0.0 05/06/2017 0353   BASOSABS 0.0 05/06/2017 0353    BMET    Component Value Date/Time   NA 137 09/08/2019 0110   K 4.2 09/08/2019 0110   CL 99 09/08/2019 0110   CO2 28 09/08/2019 0110   GLUCOSE 140 (H) 09/08/2019 0110   BUN 16 09/08/2019 0110   CREATININE 1.18 (H) 09/08/2019 0110   CALCIUM 9.0 09/08/2019 0110   GFRNONAA 47 (L) 09/08/2019 0110   GFRAA 54 (L) 09/08/2019 0110     Intake/Output Summary (Last 24 hours) at 09/08/2019 0727 Last data filed at 09/08/2019 0435 Gross per 24 hour  Intake 1653.53 ml  Output 725 ml  Net 928.53 ml    HOSPITAL MEDICATIONS Scheduled Meds: . Chlorhexidine Gluconate Cloth  6 each Topical Daily  . chlorthalidone  25 mg Oral Daily  . cholecalciferol  1,000 Units Oral q morning - 10a  . cloNIDine  0.2 mg Oral TID  . famotidine  20 mg Oral Daily  . hydrALAZINE  100 mg Oral Q8H  .  insulin aspart  0-15 Units Subcutaneous TID WC  . insulin aspart  0-5 Units Subcutaneous QHS  . labetalol  400 mg Oral TID  . melatonin  9 mg Oral QHS  . nicotine  7 mg Transdermal Daily  . NIFEdipine  90 mg Oral Daily  . polyethylene glycol  17 g Oral BID  . pravastatin  20 mg Oral q1800  . sertraline  50 mg Oral QHS  . spironolactone  50 mg Oral Daily   Continuous Infusions: . sodium chloride Stopped (08/30/19 0233)  . clevidipine 13 mg/hr (09/08/19 0613)   PRN Meds:.sodium chloride, acetaminophen, alum & mag hydroxide-simeth, diphenhydrAMINE, docusate sodium, HYDROmorphone (DILAUDID) injection, lidocaine, ondansetron, oxyCODONE, pneumococcal 23 valent vaccine  Assessment:  69 y.o. female with aortic intramural hematoma continues to require IV antihypertensives and increasing oral agents. Good UOP. Scr = 1.18  Plan: -Continue medical management and planning TEVAR with iliac artery conduit tomorrow.  -DVT prophylaxis:  SCDs   Risa Grill, PA-C [facility] [phone] 09/08/2019  7:27 AM

## 2019-09-09 ENCOUNTER — Inpatient Hospital Stay (HOSPITAL_COMMUNITY): Payer: Medicare Other

## 2019-09-09 ENCOUNTER — Inpatient Hospital Stay (HOSPITAL_COMMUNITY): Payer: Medicare Other | Admitting: Certified Registered Nurse Anesthetist

## 2019-09-09 ENCOUNTER — Encounter: Payer: Self-pay | Admitting: Surgery

## 2019-09-09 ENCOUNTER — Encounter (HOSPITAL_COMMUNITY)
Admission: EM | Disposition: A | Discharge status: Home Health | Payer: Self-pay | Source: Home / Self Care | Attending: Critical Care Medicine

## 2019-09-09 ENCOUNTER — Encounter (HOSPITAL_COMMUNITY): Payer: Self-pay | Admitting: Internal Medicine

## 2019-09-09 HISTORY — PX: ULTRASOUND GUIDANCE FOR VASCULAR ACCESS: SHX6516

## 2019-09-09 HISTORY — PX: ENDARTERECTOMY: SHX5162

## 2019-09-09 HISTORY — PX: INSERTION OF ILIAC STENT: SHX6256

## 2019-09-09 LAB — BASIC METABOLIC PANEL
Anion gap: 10 (ref 5–15)
Anion gap: 13 (ref 5–15)
BUN: 17 mg/dL (ref 8–23)
BUN: 16 mg/dL (ref 8–23)
CO2: 25 mmol/L (ref 22–32)
CO2: 24 mmol/L (ref 22–32)
Calcium: 9.1 mg/dL (ref 8.9–10.3)
Calcium: 9.1 mg/dL (ref 8.9–10.3)
Chloride: 99 mmol/L (ref 98–111)
Chloride: 99 mmol/L (ref 98–111)
Creatinine, Ser: 1.52 mg/dL — ABNORMAL HIGH (ref 0.44–1.00)
Creatinine, Ser: 1.46 mg/dL — ABNORMAL HIGH (ref 0.44–1.00)
GFR calc Af Amer: 40 mL/min — ABNORMAL LOW (ref >60)
GFR calc Af Amer: 42 mL/min — ABNORMAL LOW (ref >60)
GFR calc non Af Amer: 35 mL/min — ABNORMAL LOW (ref >60)
GFR calc non Af Amer: 36 mL/min — ABNORMAL LOW (ref >60)
Glucose, Bld: 153 mg/dL — ABNORMAL HIGH (ref 70–99)
Glucose, Bld: 160 mg/dL — ABNORMAL HIGH (ref 70–99)
Potassium: 4 mmol/L (ref 3.5–5.1)
Potassium: 4.6 mmol/L (ref 3.5–5.1)
Sodium: 134 mmol/L — ABNORMAL LOW (ref 135–145)
Sodium: 136 mmol/L (ref 135–145)

## 2019-09-09 LAB — TYPE AND SCREEN
ABO/RH(D): O NEG
Antibody Screen: NEGATIVE

## 2019-09-09 LAB — POCT I-STAT 7, (LYTES, BLD GAS, ICA,H+H)
Acid-Base Excess: 2 mmol/L (ref 0.0–2.0)
Acid-Base Excess: 4 mmol/L — ABNORMAL HIGH (ref 0.0–2.0)
Bicarbonate: 27.5 mmol/L (ref 20.0–28.0)
Bicarbonate: 29.2 mmol/L — ABNORMAL HIGH (ref 20.0–28.0)
Calcium, Ion: 1.01 mmol/L — ABNORMAL LOW (ref 1.15–1.40)
Calcium, Ion: 1.22 mmol/L (ref 1.15–1.40)
HCT: 28 % — ABNORMAL LOW (ref 36.0–46.0)
HCT: 27 % — ABNORMAL LOW (ref 36.0–46.0)
Hemoglobin: 9.5 g/dL — ABNORMAL LOW (ref 12.0–15.0)
Hemoglobin: 9.2 g/dL — ABNORMAL LOW (ref 12.0–15.0)
O2 Saturation: 92 %
O2 Saturation: 93 %
Potassium: 4.1 mmol/L (ref 3.5–5.1)
Potassium: 4.5 mmol/L (ref 3.5–5.1)
Sodium: 137 mmol/L (ref 135–145)
Sodium: 136 mmol/L (ref 135–145)
TCO2: 29 mmol/L (ref 22–32)
TCO2: 31 mmol/L (ref 22–32)
pCO2 arterial: 45.6 mmHg (ref 32.0–48.0)
pCO2 arterial: 48.6 mmHg — ABNORMAL HIGH (ref 32.0–48.0)
pH, Arterial: 7.389 (ref 7.350–7.450)
pH, Arterial: 7.386 (ref 7.350–7.450)
pO2, Arterial: 65 mmHg — ABNORMAL LOW (ref 83.0–108.0)
pO2, Arterial: 68 mmHg — ABNORMAL LOW (ref 83.0–108.0)

## 2019-09-09 LAB — CBC
HCT: 28.4 % — ABNORMAL LOW (ref 36.0–46.0)
HCT: 30 % — ABNORMAL LOW (ref 36.0–46.0)
Hemoglobin: 8.7 g/dL — ABNORMAL LOW (ref 12.0–15.0)
Hemoglobin: 9.2 g/dL — ABNORMAL LOW (ref 12.0–15.0)
MCH: 24.4 pg — ABNORMAL LOW (ref 26.0–34.0)
MCH: 24.7 pg — ABNORMAL LOW (ref 26.0–34.0)
MCHC: 30.6 g/dL (ref 30.0–36.0)
MCHC: 30.7 g/dL (ref 30.0–36.0)
MCV: 79.8 fL — ABNORMAL LOW (ref 80.0–100.0)
MCV: 80.4 fL (ref 80.0–100.0)
Platelets: 299 10*3/uL (ref 150–400)
Platelets: 372 10*3/uL (ref 150–400)
RBC: 3.56 MIL/uL — ABNORMAL LOW (ref 3.87–5.11)
RBC: 3.73 MIL/uL — ABNORMAL LOW (ref 3.87–5.11)
RDW: 20.6 % — ABNORMAL HIGH (ref 11.5–15.5)
RDW: 20.7 % — ABNORMAL HIGH (ref 11.5–15.5)
WBC: 9.5 10*3/uL (ref 4.0–10.5)
WBC: 14.3 10*3/uL — ABNORMAL HIGH (ref 4.0–10.5)
nRBC: 0 % (ref 0.0–0.2)
nRBC: 0 % (ref 0.0–0.2)

## 2019-09-09 LAB — GLUCOSE, CAPILLARY
Glucose-Capillary: 134 mg/dL — ABNORMAL HIGH (ref 70–99)
Glucose-Capillary: 142 mg/dL — ABNORMAL HIGH (ref 70–99)
Glucose-Capillary: 147 mg/dL — ABNORMAL HIGH (ref 70–99)
Glucose-Capillary: 148 mg/dL — ABNORMAL HIGH (ref 70–99)
Glucose-Capillary: 125 mg/dL — ABNORMAL HIGH (ref 70–99)

## 2019-09-09 LAB — POCT ACTIVATED CLOTTING TIME
Activated Clotting Time: 197 seconds
Activated Clotting Time: 208 seconds
Activated Clotting Time: 208 seconds
Activated Clotting Time: 202 seconds
Activated Clotting Time: 213 seconds

## 2019-09-09 LAB — PROTIME-INR
INR: 1.1 (ref 0.8–1.2)
INR: 1.2 (ref 0.8–1.2)
Prothrombin Time: 13.8 seconds (ref 11.4–15.2)
Prothrombin Time: 14.5 seconds (ref 11.4–15.2)

## 2019-09-09 LAB — APTT: aPTT: 36 seconds (ref 24–36)

## 2019-09-09 LAB — MAGNESIUM: Magnesium: 1.7 mg/dL (ref 1.7–2.4)

## 2019-09-09 SURGERY — INSERTION, ENDOVASCULAR STENT GRAFT, AORTA, THORACIC
Anesthesia: General | Site: Groin

## 2019-09-09 MED ORDER — HEPARIN SODIUM (PORCINE) 1000 UNIT/ML IJ SOLN
INTRAMUSCULAR | Status: DC | PRN
  Administered 2019-09-09 (×3): 1000 [IU] via INTRAVENOUS
  Administered 2019-09-09: 6000 [IU] via INTRAVENOUS
  Administered 2019-09-09: 2000 [IU] via INTRAVENOUS

## 2019-09-09 MED ORDER — MIDAZOLAM HCL 2 MG/2ML IJ SOLN
INTRAMUSCULAR | Status: AC
  Filled 2019-09-09: qty 2

## 2019-09-09 MED ORDER — SODIUM CHLORIDE 0.9 % IV SOLN
INTRAVENOUS | Status: DC | PRN
  Administered 2019-09-09 (×2): 500 mL

## 2019-09-09 MED ORDER — LABETALOL HCL 5 MG/ML IV SOLN
10.0000 mg | INTRAVENOUS | Status: AC | PRN
  Administered 2019-09-14 (×4): 10 mg via INTRAVENOUS
  Filled 2019-09-09 (×4): qty 4

## 2019-09-09 MED ORDER — LACTATED RINGERS IV SOLN: INTRAVENOUS | Status: DC

## 2019-09-09 MED ORDER — HYDRALAZINE HCL 20 MG/ML IJ SOLN
5.0000 mg | INTRAMUSCULAR | Status: AC | PRN
  Administered 2019-09-10 – 2019-09-14 (×2): 5 mg via INTRAVENOUS
  Filled 2019-09-09 (×2): qty 1

## 2019-09-09 MED ORDER — VANCOMYCIN HCL 1000 MG IV SOLR
INTRAVENOUS | Status: DC | PRN
  Administered 2019-09-09: 1000 mg via INTRAVENOUS

## 2019-09-09 MED ORDER — ONDANSETRON HCL 4 MG/2ML IJ SOLN: 4.0000 mg | Freq: Once | INTRAMUSCULAR | Status: DC | PRN

## 2019-09-09 MED ORDER — SODIUM CHLORIDE 0.9 % IV SOLN
INTRAVENOUS | Status: AC
  Filled 2019-09-09: qty 1.2

## 2019-09-09 MED ORDER — METOPROLOL TARTRATE 5 MG/5ML IV SOLN: 2.0000 mg | INTRAVENOUS | Status: DC | PRN

## 2019-09-09 MED ORDER — ONDANSETRON HCL 4 MG/2ML IJ SOLN: 4.0000 mg | Freq: Four times a day (QID) | INTRAMUSCULAR | Status: DC | PRN

## 2019-09-09 MED ORDER — PROPOFOL 10 MG/ML IV BOLUS
INTRAVENOUS | Status: AC
  Filled 2019-09-09: qty 20

## 2019-09-09 MED ORDER — PROPOFOL 10 MG/ML IV BOLUS
INTRAVENOUS | Status: DC | PRN
  Administered 2019-09-09: 200 mg via INTRAVENOUS

## 2019-09-09 MED ORDER — 0.9 % SODIUM CHLORIDE (POUR BTL) OPTIME
TOPICAL | Status: DC | PRN
  Administered 2019-09-09: 2000 mL

## 2019-09-09 MED ORDER — GUAIFENESIN-DM 100-10 MG/5ML PO SYRP
15.0000 mL | ORAL_SOLUTION | ORAL | Status: DC | PRN
  Administered 2019-09-14: 15 mL via ORAL
  Filled 2019-09-09: qty 15

## 2019-09-09 MED ORDER — FENTANYL CITRATE (PF) 100 MCG/2ML IJ SOLN
INTRAMUSCULAR | Status: DC | PRN
  Administered 2019-09-09: 150 ug via INTRAVENOUS
  Administered 2019-09-09: 50 ug via INTRAVENOUS

## 2019-09-09 MED ORDER — ROCURONIUM BROMIDE 10 MG/ML (PF) SYRINGE
PREFILLED_SYRINGE | INTRAVENOUS | Status: DC | PRN
  Administered 2019-09-09 (×2): 20 mg via INTRAVENOUS
  Administered 2019-09-09: 50 mg via INTRAVENOUS

## 2019-09-09 MED ORDER — SODIUM CHLORIDE 0.9 % IV SOLN
0.0125 ug/kg/min | INTRAVENOUS | Status: AC
  Administered 2019-09-09: .15 ug/kg/min via INTRAVENOUS
  Filled 2019-09-09: qty 2000

## 2019-09-09 MED ORDER — CHLORHEXIDINE GLUCONATE 0.12 % MT SOLN
OROMUCOSAL | Status: AC
  Filled 2019-09-09: qty 15

## 2019-09-09 MED ORDER — MIDAZOLAM HCL 5 MG/5ML IJ SOLN
INTRAMUSCULAR | Status: DC | PRN
  Administered 2019-09-09: 2 mg via INTRAVENOUS

## 2019-09-09 MED ORDER — FENTANYL CITRATE (PF) 250 MCG/5ML IJ SOLN
INTRAMUSCULAR | Status: AC
  Filled 2019-09-09: qty 5

## 2019-09-09 MED ORDER — DIPHENHYDRAMINE HCL 12.5 MG/5ML PO ELIX: 12.5000 mg | ORAL_SOLUTION | Freq: Four times a day (QID) | ORAL | Status: DC | PRN

## 2019-09-09 MED ORDER — HEPARIN SODIUM (PORCINE) 1000 UNIT/ML IJ SOLN
INTRAMUSCULAR | Status: AC
  Filled 2019-09-09: qty 1

## 2019-09-09 MED ORDER — LIDOCAINE 2% (20 MG/ML) 5 ML SYRINGE
INTRAMUSCULAR | Status: AC
  Filled 2019-09-09: qty 5

## 2019-09-09 MED ORDER — FENTANYL CITRATE (PF) 100 MCG/2ML IJ SOLN
INTRAMUSCULAR | Status: AC
  Filled 2019-09-09: qty 2

## 2019-09-09 MED ORDER — PHENOL 1.4 % MT LIQD: 1.0000 | OROMUCOSAL | Status: DC | PRN

## 2019-09-09 MED ORDER — ONDANSETRON HCL 4 MG/2ML IJ SOLN
INTRAMUSCULAR | Status: DC | PRN
  Administered 2019-09-09: 4 mg via INTRAVENOUS

## 2019-09-09 MED ORDER — SODIUM CHLORIDE 0.9 % IV SOLN: 500.0000 mL | Freq: Once | INTRAVENOUS | Status: DC | PRN

## 2019-09-09 MED ORDER — MINOXIDIL 10 MG PO TABS
10.0000 mg | ORAL_TABLET | Freq: Every day | ORAL | Status: DC
  Administered 2019-09-10 – 2019-09-11 (×2): 10 mg via ORAL
  Filled 2019-09-09 (×3): qty 1

## 2019-09-09 MED ORDER — FENTANYL CITRATE (PF) 100 MCG/2ML IJ SOLN
25.0000 ug | INTRAMUSCULAR | Status: DC | PRN
  Administered 2019-09-09 (×3): 50 ug via INTRAVENOUS

## 2019-09-09 MED ORDER — IODIXANOL 320 MG/ML IV SOLN
INTRAVENOUS | Status: DC | PRN
  Administered 2019-09-09: 94.5 mL

## 2019-09-09 MED ORDER — SODIUM CHLORIDE 0.9% FLUSH: 9.0000 mL | INTRAVENOUS | Status: DC | PRN

## 2019-09-09 MED ORDER — NALOXONE HCL 0.4 MG/ML IJ SOLN: 0.4000 mg | INTRAMUSCULAR | Status: DC | PRN

## 2019-09-09 MED ORDER — HYDROMORPHONE 1 MG/ML IV SOLN
INTRAVENOUS | Status: DC
  Administered 2019-09-09: 4.8 mg via INTRAVENOUS
  Administered 2019-09-09: 30 mg via INTRAVENOUS
  Administered 2019-09-10: 1.2 mg via INTRAVENOUS
  Administered 2019-09-10: 9 mg via INTRAVENOUS
  Administered 2019-09-10: 3.9 mg via INTRAVENOUS
  Administered 2019-09-10: 19 mg via INTRAVENOUS
  Administered 2019-09-11: 5 mg via INTRAVENOUS
  Administered 2019-09-11: 6 mg via INTRAVENOUS
  Administered 2019-09-11: 1.2 mg via INTRAVENOUS
  Administered 2019-09-11: 4.2 mg via INTRAVENOUS
  Administered 2019-09-11: 30 mg via INTRAVENOUS
  Administered 2019-09-11: 1.8 mg via INTRAVENOUS
  Administered 2019-09-12: 0.9 mg via INTRAVENOUS
  Administered 2019-09-12: 1.2 mg via INTRAVENOUS
  Administered 2019-09-12: 2.7 mg via INTRAVENOUS
  Administered 2019-09-12: 1.5 mg via INTRAVENOUS
  Administered 2019-09-12: 0.3 mg via INTRAVENOUS
  Administered 2019-09-12: 3 mg via INTRAVENOUS
  Administered 2019-09-13: 1.5 mg via INTRAVENOUS
  Administered 2019-09-13: 0.6 mg via INTRAVENOUS
  Administered 2019-09-13: 0.3 mg via INTRAVENOUS
  Administered 2019-09-13: 2.1 mg via INTRAVENOUS
  Administered 2019-09-13: 1.5 mg via INTRAVENOUS
  Administered 2019-09-13: 0.6 mg via INTRAVENOUS
  Administered 2019-09-14: 0.9 mg via INTRAVENOUS
  Administered 2019-09-14 (×3): 0.3 mg via INTRAVENOUS
  Administered 2019-09-14: 0.9 mg via INTRAVENOUS
  Administered 2019-09-14: 30 mg via INTRAVENOUS
  Administered 2019-09-14: 0.6 mg via INTRAVENOUS
  Administered 2019-09-15: 2.1 mg via INTRAVENOUS
  Filled 2019-09-09 (×3): qty 30

## 2019-09-09 MED ORDER — HEMOSTATIC AGENTS (NO CHARGE) OPTIME
TOPICAL | Status: DC | PRN
  Administered 2019-09-09: 1 via TOPICAL

## 2019-09-09 MED ORDER — HYDROMORPHONE HCL 1 MG/ML IJ SOLN
1.0000 mg | INTRAMUSCULAR | Status: DC | PRN
  Administered 2019-09-09: 1 mg via INTRAVENOUS
  Filled 2019-09-09: qty 1

## 2019-09-09 MED ORDER — SUGAMMADEX SODIUM 200 MG/2ML IV SOLN
INTRAVENOUS | Status: DC | PRN
  Administered 2019-09-09: 200 mg via INTRAVENOUS

## 2019-09-09 MED ORDER — ESMOLOL HCL 100 MG/10ML IV SOLN
INTRAVENOUS | Status: AC
  Filled 2019-09-09: qty 10

## 2019-09-09 MED ORDER — FENTANYL CITRATE (PF) 100 MCG/2ML IJ SOLN
INTRAMUSCULAR | Status: AC
Start: 2019-09-09 — End: 2019-09-10
  Filled 2019-09-09: qty 2

## 2019-09-09 MED ORDER — MAGNESIUM SULFATE 2 GM/50ML IV SOLN: 2.0000 g | Freq: Every day | INTRAVENOUS | Status: DC | PRN

## 2019-09-09 MED ORDER — IPRATROPIUM-ALBUTEROL 0.5-2.5 (3) MG/3ML IN SOLN
RESPIRATORY_TRACT | Status: AC
  Administered 2019-09-09: 3 mL
  Filled 2019-09-09: qty 3

## 2019-09-09 MED ORDER — LACTATED RINGERS IV SOLN: INTRAVENOUS | Status: DC | PRN

## 2019-09-09 MED ORDER — LIDOCAINE 2% (20 MG/ML) 5 ML SYRINGE
INTRAMUSCULAR | Status: DC | PRN
  Administered 2019-09-09: 80 mg via INTRAVENOUS

## 2019-09-09 MED ORDER — DIPHENHYDRAMINE HCL 50 MG/ML IJ SOLN: 12.5000 mg | Freq: Four times a day (QID) | INTRAMUSCULAR | Status: DC | PRN

## 2019-09-09 MED ORDER — ESMOLOL HCL 100 MG/10ML IV SOLN
INTRAVENOUS | Status: DC | PRN
  Administered 2019-09-09: 30 mg via INTRAVENOUS
  Administered 2019-09-09: 40 mg via INTRAVENOUS
  Administered 2019-09-09: 30 mg via INTRAVENOUS

## 2019-09-09 MED ORDER — POTASSIUM CHLORIDE CRYS ER 20 MEQ PO TBCR
20.0000 meq | EXTENDED_RELEASE_TABLET | Freq: Every day | ORAL | Status: AC | PRN
  Administered 2019-09-14: 20 meq via ORAL

## 2019-09-09 MED ORDER — CHLORHEXIDINE GLUCONATE 0.12 % MT SOLN
15.0000 mL | OROMUCOSAL | Status: AC
  Filled 2019-09-09 (×2): qty 15

## 2019-09-09 MED ORDER — PROTAMINE SULFATE 10 MG/ML IV SOLN
INTRAVENOUS | Status: DC | PRN
  Administered 2019-09-09: 20 mg via INTRAVENOUS
  Administered 2019-09-09: 10 mg via INTRAVENOUS
  Administered 2019-09-09: 20 mg via INTRAVENOUS

## 2019-09-09 MED ORDER — DEXAMETHASONE SODIUM PHOSPHATE 10 MG/ML IJ SOLN
INTRAMUSCULAR | Status: DC | PRN
  Administered 2019-09-09: 5 mg via INTRAVENOUS

## 2019-09-09 SURGICAL SUPPLY — 95 items
BAG DECANTER FOR FLEXI CONT (MISCELLANEOUS) ×1 IMPLANT
BALLN MUSTANG 9X40X75 (BALLOONS) ×4
BALLOON MUSTANG 9X40X75 (BALLOONS) IMPLANT
CANISTER SUCT 3000ML PPV (MISCELLANEOUS) ×4 IMPLANT
CATH ACCU-VU SIZ PIG 5F 100CM (CATHETERS) ×1 IMPLANT
CATH BEACON 5 .035 65 KMP TIP (CATHETERS) ×1 IMPLANT
CATH OMNI FLUSH .035X70CM (CATHETERS) ×1 IMPLANT
CLOSURE MYNX CONTROL 6F/7F (Vascular Products) ×1 IMPLANT
CNTNR URN SCR LID CUP LEK RST (MISCELLANEOUS) IMPLANT
CONT SPEC 4OZ STRL OR WHT (MISCELLANEOUS) ×4
COVER PROBE W GEL 5X96 (DRAPES) ×4 IMPLANT
COVER WAND RF STERILE (DRAPES) ×3 IMPLANT
DERMABOND ADVANCED (GAUZE/BANDAGES/DRESSINGS) ×2
DERMABOND ADVANCED .7 DNX12 (GAUZE/BANDAGES/DRESSINGS) ×3 IMPLANT
DEVICE CLOSURE PERCLS PRGLD 6F (VASCULAR PRODUCTS) IMPLANT
DRAPE ZERO GRAVITY STERILE (DRAPES) ×3 IMPLANT
DRSG TEGADERM 2-3/8X2-3/4 SM (GAUZE/BANDAGES/DRESSINGS) ×4 IMPLANT
DRYSEAL FLEXSHEATH 20FR 33CM (SHEATH) ×1
ELECT BLADE 4.0 EZ CLEAN MEGAD (MISCELLANEOUS) ×4
ELECT CAUTERY BLADE 6.4 (BLADE) ×4 IMPLANT
ELECT REM PT RETURN 9FT ADLT (ELECTROSURGICAL) ×8
ELECTRODE BLDE 4.0 EZ CLN MEGD (MISCELLANEOUS) IMPLANT
ELECTRODE REM PT RTRN 9FT ADLT (ELECTROSURGICAL) ×6 IMPLANT
FELT TEFLON 1X6 (MISCELLANEOUS) ×1 IMPLANT
GAUZE SPONGE 2X2 8PLY STRL LF (GAUZE/BANDAGES/DRESSINGS) IMPLANT
GLOVE BIO SURGEON STRL SZ 6 (GLOVE) ×2 IMPLANT
GLOVE BIO SURGEON STRL SZ7.5 (GLOVE) ×1 IMPLANT
GLOVE BIOGEL PI IND STRL 6.5 (GLOVE) IMPLANT
GLOVE BIOGEL PI IND STRL 7.5 (GLOVE) ×3 IMPLANT
GLOVE BIOGEL PI INDICATOR 6.5 (GLOVE) ×1
GLOVE BIOGEL PI INDICATOR 7.5 (GLOVE) ×1
GLOVE SS BIOGEL STRL SZ 6.5 (GLOVE) IMPLANT
GLOVE SUPERSENSE BIOGEL SZ 6.5 (GLOVE) ×1
GLOVE SURG SS PI 7.5 STRL IVOR (GLOVE) ×4 IMPLANT
GOWN SPEC L3 XXLG W/TWL (GOWN DISPOSABLE) ×1 IMPLANT
GOWN STRL REUS W/ TWL LRG LVL3 (GOWN DISPOSABLE) ×6 IMPLANT
GOWN STRL REUS W/ TWL XL LVL3 (GOWN DISPOSABLE) ×3 IMPLANT
GOWN STRL REUS W/TWL LRG LVL3 (GOWN DISPOSABLE) ×16
GOWN STRL REUS W/TWL XL LVL3 (GOWN DISPOSABLE) ×8
GRAFT BALLN CATH 65CM (STENTS) IMPLANT
GRAFT CV 30X10STRG TUBE (Vascular Products) IMPLANT
GRAFT HEMASHIELD 10MM (Vascular Products) ×4 IMPLANT
HEMOSTAT SNOW SURGICEL 2X4 (HEMOSTASIS) IMPLANT
INSERT FOGARTY 61MM (MISCELLANEOUS) ×1 IMPLANT
INSERT FOGARTY SM (MISCELLANEOUS) ×2 IMPLANT
KIT BASIN OR (CUSTOM PROCEDURE TRAY) ×4 IMPLANT
KIT ENCORE 26 ADVANTAGE (KITS) ×1 IMPLANT
KIT TURNOVER KIT B (KITS) ×4 IMPLANT
NDL PERC 18GX7CM (NEEDLE) IMPLANT
NEEDLE PERC 18GX7CM (NEEDLE) IMPLANT
NS IRRIG 1000ML POUR BTL (IV SOLUTION) ×4 IMPLANT
PACK ENDOVASCULAR (PACKS) ×4 IMPLANT
PAD ARMBOARD 7.5X6 YLW CONV (MISCELLANEOUS) ×8 IMPLANT
PENCIL BUTTON HOLSTER BLD 10FT (ELECTRODE) ×4 IMPLANT
PERCLOSE PROGLIDE 6F (VASCULAR PRODUCTS)
SET MICROPUNCTURE 5F STIFF (MISCELLANEOUS) ×1 IMPLANT
SHEATH AVANTI 11CM 8FR (SHEATH) IMPLANT
SHEATH DRYSEAL FLEX 20FR 33CM (SHEATH) IMPLANT
SHEATH PINNACLE 6F 10CM (SHEATH) ×1 IMPLANT
SHIELD RADPAD ABSORB 11X34 (MISCELLANEOUS) ×1 IMPLANT
SHIELD RADPAD SCOOP 12X17 (MISCELLANEOUS) ×8 IMPLANT
SPONGE GAUZE 2X2 STER 10/PKG (GAUZE/BANDAGES/DRESSINGS) ×1
STENT EPIC VASCULAR 10X60X75 (Permanent Stent) ×1 IMPLANT
STENT GRAFT BALLN CATH 65CM (STENTS)
STENT GRFT THORAC ACS 31X31X15 (Endovascular Graft) ×1 IMPLANT
STENT GRFT THORAC ACS 31X31X20 (Endovascular Graft) ×1 IMPLANT
STOPCOCK MORSE 400PSI 3WAY (MISCELLANEOUS) ×5 IMPLANT
SURGIFLO W/THROMBIN 8M KIT (HEMOSTASIS) ×1 IMPLANT
SUT ETHILON 3 0 PS 1 (SUTURE) IMPLANT
SUT PDS AB 1 CTX 36 (SUTURE) ×1 IMPLANT
SUT PROLENE 5 0 C 1 24 (SUTURE) ×3 IMPLANT
SUT SILK 0 TIES 10X30 (SUTURE) ×1 IMPLANT
SUT SILK 2 0 (SUTURE) ×4
SUT SILK 2-0 18XBRD TIE 12 (SUTURE) IMPLANT
SUT SILK 3 0 (SUTURE) ×4
SUT SILK 3-0 18XBRD TIE 12 (SUTURE) IMPLANT
SUT SILK 4 0 (SUTURE) ×4
SUT SILK 4-0 18XBRD TIE 12 (SUTURE) IMPLANT
SUT VIC AB 2-0 CT1 27 (SUTURE)
SUT VIC AB 2-0 CT1 TAPERPNT 27 (SUTURE) IMPLANT
SUT VIC AB 3-0 SH 27 (SUTURE)
SUT VIC AB 3-0 SH 27X BRD (SUTURE) IMPLANT
SUT VICRYL 4-0 PS2 18IN ABS (SUTURE) ×1 IMPLANT
SYR 10ML LL (SYRINGE) ×3 IMPLANT
SYR 30ML LL (SYRINGE) IMPLANT
SYR 50ML LL SCALE MARK (SYRINGE) ×4 IMPLANT
SYR MEDRAD MARK V 150ML (SYRINGE) ×1 IMPLANT
TOWEL GREEN STERILE (TOWEL DISPOSABLE) ×4 IMPLANT
TRAY FOLEY MTR SLVR 16FR STAT (SET/KITS/TRAYS/PACK) ×4 IMPLANT
TUBING HIGH PRESSURE 120CM (CONNECTOR) ×4 IMPLANT
TUBING INJECTOR 48 (MISCELLANEOUS) ×1 IMPLANT
WIRE AMPLATZ SS-J .035X180CM (WIRE) ×2 IMPLANT
WIRE BENTSON .035X145CM (WIRE) ×1 IMPLANT
WIRE HI TORQ VERSACORE J 260CM (WIRE) ×1 IMPLANT
WIRE STIFF LUNDERQUIST 260CM (WIRE) ×1 IMPLANT

## 2019-09-09 NOTE — Interval H&P Note (Signed)
History and Physical Interval Note:  09/09/2019 9:34 AM  [name]  has presented today for surgery, with the diagnosis of thoracic aortic ulcer.  The various methods of treatment have been discussed with the patient and family. After consideration of risks, benefits and other options for treatment, the patient has consented to  Procedure(s): THORACIC AORTIC ENDOVASCULAR STENT GRAFT WITH ILIAC CONDUIT (N/A) as a surgical intervention.  The patient's history has been reviewed, patient examined, no change in status, stable for surgery.  I have reviewed the patient's chart and labs.  Questions were answered to the patient's satisfaction.     [name]

## 2019-09-09 NOTE — Anesthesia Procedure Notes (Signed)
Procedure Name: Intubation Date/Time: 09/09/2019 10:58 AM Performed by: Candis Shine, CRNA Pre-anesthesia Checklist: Patient identified, Emergency Drugs available, Suction available and Patient being monitored Patient Re-evaluated:Patient Re-evaluated prior to induction Oxygen Delivery Method: Circle System Utilized Preoxygenation: Pre-oxygenation with 100% oxygen Induction Type: IV induction Ventilation: Mask ventilation without difficulty Laryngoscope Size: Mac and 3 Grade View: Grade I Tube type: Oral Tube size: 7.0 mm Number of attempts: 1 Airway Equipment and Method: Stylet Placement Confirmation: ETT inserted through vocal cords under direct vision,  positive ETCO2 and breath sounds checked- equal and bilateral Secured at: 22 cm Tube secured with: Tape Dental Injury: Teeth and Oropharynx as per pre-operative assessment

## 2019-09-09 NOTE — Anesthesia Preprocedure Evaluation (Signed)
Anesthesia Evaluation  Patient identified by MRN, date of birth, ID band Patient awake    Reviewed: Allergy & Precautions, NPO status , Patient's Chart, lab work & pertinent test results  Airway Mallampati: II  TM Distance: >3 FB Neck ROM: Full    Dental  (+) Dental Advisory Given   Pulmonary sleep apnea , COPD, Current Smoker,    breath sounds clear to auscultation       Cardiovascular hypertension, Pt. on medications and Pt. on home beta blockers + angina  Rhythm:Regular Rate:Normal     Neuro/Psych negative neurological ROS     GI/Hepatic Neg liver ROS, GERD  ,  Endo/Other  diabetes, Type 2, Oral Hypoglycemic AgentsHypothyroidism   Renal/GU negative Renal ROS     Musculoskeletal  (+) Arthritis ,   Abdominal   Peds  Hematology  (+) anemia ,   Anesthesia Other Findings   Reproductive/Obstetrics                             Lab Results  Component Value Date   WBC 9.5 09/09/2019   HGB 8.7 (L) 09/09/2019   HCT 28.4 (L) 09/09/2019   MCV 79.8 (L) 09/09/2019   PLT 299 09/09/2019   Lab Results  Component Value Date   CREATININE 1.52 (H) 09/09/2019   BUN 17 09/09/2019   NA 134 (L) 09/09/2019   K 4.0 09/09/2019   CL 99 09/09/2019   CO2 25 09/09/2019    Anesthesia Physical Anesthesia Plan  ASA: III  Anesthesia Plan: General   Post-op Pain Management:    Induction: Intravenous  PONV Risk Score and Plan: 2 and Dexamethasone, Ondansetron and Treatment may vary due to age or medical condition  Airway Management Planned: Oral ETT  Additional Equipment: Arterial line  Intra-op Plan:   Post-operative Plan: Extubation in OR  Informed Consent: I have reviewed the patients History and Physical, chart, labs and discussed the procedure including the risks, benefits and alternatives for the proposed anesthesia with the patient or authorized representative who has indicated his/her  understanding and acceptance.     Dental advisory given  Plan Discussed with: CRNA  Anesthesia Plan Comments:         Anesthesia Quick Evaluation

## 2019-09-09 NOTE — Op Note (Signed)
Patient name: [name] MRN: [id number] DOB: [birth year] Sex: female  09/09/2019 Pre-operative Diagnosis: Thoracic aortic ulcer with intramural hematoma Post-operative diagnosis:  Same Surgeon:  [name] Assistants: Gae Gallop, Risa Grill Procedure:   #1: Endovascular repair of thoracic aortic ulcer/aneurysm without coverage of left subclavian   #2: Left external iliac conduit (10 mm dacryon)   #3: Left external iliac artery endarterectomy   #4: Thoracic and abdominal aortogram   #5: Radiology supervision interpretation   #6: Ultrasound-guided access, left common femoral artery   #7: Left external iliac stent (10x60)   #8: Closure device, Mynx  Anesthesia: General Blood Loss: Minimal Specimens: None  Devices used: Gore CTAG 31 x 20, 31 x 15  Findings: Upon opening the left external iliac artery for conduit, there was significant plaque which obliterated the lumen.  I performed a endarterectomy of the artery in this area.  I tacked down the distal plaque.  A 10 mm dacryon graft was sewn on as a conduit.  Successful thoracic aortic stent grafting was performed from the left subclavian to just above the celiac artery.  Completion imaging showed excellent result.  There was however a stenosis at the distal endpoint of the endarterectomy and so I elected to stent this using a 10 x 60 stent  Indications: The patient presented to the emergency department with severe back pain.  She was found to have a thoracic aortic penetrating ulcer with significant intramural hematoma and aneurysmal change.  She was initially managed with blood pressure and pain control.  Her pain resolved however she had difficult to control blood pressure.  We discussed proceeding with repair.  Procedure:  The patient was identified in the holding area and taken to [place] [facility]  The patient was then placed supine on the table. general anesthesia was administered.  The patient was prepped and draped in  the usual sterile fashion.  A time out was called and antibiotics were administered.  A left lower quadrant oblique incision was made below the umbilicus beginning in the midline extending laterally.  Cautery was used divide subcutaneous tissue down to the fascia which was opened with cautery.  I began lateral to the rectus muscle and entered the retroperitoneal space.  The peritoneal contents were then mobilized cephalad and medial.  I exposed the external iliac artery.  This was a soft artery however there was some plaque posteriorly.  I mobilized the artery up to the hypogastric origin.  The ureter was visualized and protected.  Once I had adequate exposure the patient was fully heparinized.  Heparin levels were checked with ACT measurements and redosed appropriately.  The iliac artery was then occluded with vascular clamps and a #11 blade was used to make an arteriotomy which was extended longitudinally with Potts scissors.  There was a significant amount of plaque circumferentially around the iliac artery.  I had difficulty identifying a lumen.  I ultimately ended up doing an endarterectomy.  I tacked the plaque distally with several 5-0 Prolene sutures.  A 10 mm dacryon graft was then beveled to fit the size of the arteriotomy and a running anastomosis was created with 5-0 Prolene.  Clamps were then released.  I made a counterincision below the previous incision and brought the graft out through this.  I then cannulated the graft with a micropuncture needle and wire and inserted a micropuncture sheath.  I then advanced a Bentson wire into the aorta.  A Omni Flush catheter was placed through  an 8 French sheath and directed the wire into the ascending aorta.  A Lunderquist double curve wire was then inserted.  Next, the 8 French sheath was removed and a 20 [place] dry seal sheath was advanced into the aorta.  The initial piece was prepared on the back table and inserted.  This was a Gore 31 x 20 device.  A second  access through the sheath was performed and a pigtail catheter was advanced into the ascending aorta.  The image detector was rotated to 60 degrees LAO.  A aortic arch angiogram was performed locating the great vessels.  The device was then deployed landing at the level of the left subclavian artery without coverage.  This device was then removed as was the pigtail catheter.  A second device was inserted.  This was a Gore 31 x 15 device.  The pigtail catheter was then advanced into the existing device.  The image detector was rotated to a RAO 50 projection and contrast injection was performed which located the origin of the celiac artery.  I deployed the second device landing just above the celiac artery.  Completion imaging was then performed which showed excellent position of the graft with preserved patency of the great vessels as well as the celiac artery.  The device and pigtail catheter were then removed.  The sheath was then withdrawn into the conduit and a retrograde injection was performed.  This showed that the iliac artery was patent however there did appear to be a significant stenosis where the plaque had been tacked up.  I felt that this was hemodynamically significant, greater than 70% and needed to be addressed.  I placed a clamp on the proximal portion of the iliac conduit and transected it at this level.  It was oversewn with 5-0 Prolene in 2 layers.  I then placed a 2-0 silk tie around the origin of the graft.  Next attention was turned towards the left groin.  The left common femoral artery was evaluated with ultrasound it was found to be patent with mild calcification.  It was then cannulated under ultrasound guidance with a micropuncture needle.  A 018 wire was advanced without resistance followed by placing a micropuncture sheath.  Next a versa core wire was inserted without resistance into the aorta and a 6 French sheath was placed.  The image detector was rotated to a RAO position and a  retrograde injection was performed locating the level of stenosis.  I selected a 10 x 60 self-expanding stent and deployed this within the external iliac artery and molded it with a 9 mm balloon.  Follow-up imaging revealed resolution of the stenosis.  The sheath was then removed and a minx was used for closure.  Attention was then turned back towards the retroperitoneal incision.  This was irrigated and inspected and found to be hemostatic.  The fascia was reapproximated with #1 PDS suture.  The subcutaneous tissue was then closed with a layer of 2 oh and a layer of 3-0 Vicryl.  The skin was closed with 4-0 Vicryl.  The counterincision was closed with a layer of 4-0 Vicryl.  Dermabond was placed on all the incisions.  The patient had brisk bilateral pedal Doppler signals.  She was successfully extubated, found be moving all 4 extremities to command.  She was taken to recovery in stable condition there were no immediate complications.   Disposition: To PACU stable.   Theotis Burrow, M.D., FACS [facility]  Office: [phone] Pager:  [phone]

## 2019-09-09 NOTE — Progress Notes (Signed)
NAME:  [name], MRN:  [id number], DOB:  [birth year], LOS: 12 ADMISSION DATE:  08/28/2019, CONSULTATION DATE:  08/28/2019 REFERRING MD:  ED Provider, CHIEF COMPLAINT:  Abdominal pain 2/2 descending aortic aneurysm    Brief History   69 year old female with PMHx of hypertension, tobacco use disorder, atrial fibrillation, hypothyroidism presented to [facility] with abdominal pain for 2 days. She was found to have a descending thoracic aortic aneurysm and was transferred to [facility] for further management.   Past Medical History   Past Medical History:  Diagnosis Date  . Anemia   . Anxiety   . Arthritis   . Atrial fib/flutter, transient   . COPD (chronic obstructive pulmonary disease) ([place])   . Depression   . Diabetes mellitus   . Diverticulosis   . DJD (degenerative joint disease)   . GERD (gastroesophageal reflux disease)   . Gout   . History of pneumonia   . Hypertension   . Hypothyroidism   . Internal hemorrhoids    Significant Hospital Events   8/8 transferred from [facility]  Consults:  Vascular surgery  Procedures:  None  Significant Diagnostic Tests:   CT angio chest/abd/pel 8/08 >> acute appearing intramural hematoma of distal aortic arch and descending thoracic aorta originating at or distal to Lt [place] artery measuring 3.7 x 3.5 cm and extends to level of Rt renal artery mild centrilobular emphysema, scarring at lung bases  Echo 8/9 >> LVEF 65-70% without RWMA; grade I diastolic dyusfunction; trivial mitral regurgitation   CT angio chest/abd/pel 8/11 >> stable appearance of acute intramural hematoma arising distal to the takeoff of the left subclavian artery and terminating at the level of the proximal abdominal aorta; previously demonstrated possible penetrating ulcer arising from the descending thoracic aorta no longer well appreciated and likely resolved in the interim; bilateral pleural effusions with mild interlobular septal thickening suggestive of volume overload  LUE  Vascular US 8/13 >> No DVT. Acute superficial vein thrombosis of L basilic vein and L cephalic vein; acute occlusive superficial thrombophlebitis in mid basilic vein and cephalic vein   CTA chest 8/14 >> stable known intramural thoracic aortic hematoma with stable aneurysm dilation of the ascending thoracic aorta measuring 4 cm in AP diameter.  Slight worsening of small simple left pleural effusion with associated by basilar atelectasis.  Slightly nodular component to some of this posterior dependent density over the mid lung as cannot exclude superimposed infection   Micro Data:  COVID 8/8 > negative MRSA 8/8 > negative  Antimicrobials:  None   Interim history/subjective:  No acute event overnight.  Patient is seen at bedside.  She is pleasant and in no acute distress.  She states that she has a good night sleep.  Denies other problem.  Surgery today  Objective   Blood pressure 127/63, pulse 85, temperature 98.7 F (37.1 C), temperature source Oral, resp. rate (!) 23, height 5' (1.524 m), weight 58.7 kg, SpO2 94 %.        Intake/Output Summary (Last 24 hours) at 09/09/2019 1016 Last data filed at 09/09/2019 0900 Gross per 24 hour  Intake 383.63 ml  Output 1726 ml  Net -1342.37 ml   Filed Weights   09/08/19 0500 09/09/19 0645 09/09/19 0945  Weight: 54.9 kg 58.7 kg 58.7 kg    Physical Exam Constitutional:      General: She is not in acute distress.    Appearance: She is not toxic-appearing.  Eyes:     General: No scleral  icterus.       Right eye: No discharge.     Conjunctiva/sclera: Conjunctivae normal.  Cardiovascular:     Rate and Rhythm: Normal rate and regular rhythm.     Heart sounds: Murmur (3/6 systolic murmur noted at upper sternal border) heard.   Pulmonary:     Effort: Pulmonary effort is normal. No respiratory distress.     Comments: Dotting well on 4 L Abdominal:     General: Bowel sounds are normal.  Musculoskeletal:     Cervical back: Normal range of  motion.     Right lower leg: No edema.     Left lower leg: No edema.  Skin:    General: Skin is warm.     Coloration: Skin is not jaundiced.  Neurological:     General: No focal deficit present.     Mental Status: She is alert.  Psychiatric:        Mood and Affect: Mood normal.     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 CTA 8/14 shows stable known intramural thoracic aortic hematoma with stable aneurysm dilation of the ascending thoracic aorta measuring 4 cm in AP diameter.  Surgery today.  Continue to control hypertension, goal systolic blood pressures < 120.  Consider outpatient eval of resistant hypertension.  -Continue Cleviprex, currently 3, wean as tolerated  -Increased minoxidil 10 mg daily -Continue nifedipine 90 mg daily -Continue labetalol 400 mg p.o. 3 times daily -Continue hydralazine 100 mg p.o. 3 times daily daily -Continue chlorthalidone 25 mg daily -Continue spironolactone 50 mg daily -Continue clonidine 0.2 mg 3 times daily -Avoid lisinopril and amlodipine due to history of angioedema   Acute hypoxic respiratory failure requiring nasal cannula Patient is satting at 93% on 5 L nasal cannula.  Attempt to wean down to 4 L but her sat dropped in the 86%.  We will continue 5 L of nasal cannula.  Chest x-ray shows emphysematous change likely COPD related given history of smoking.  Patient has episode of the satting to the mid 80s when she is  sleep, suspected underlying sleep apnea. -Continue 4 L nasal cannula.  Wean as tolerated   Elevated creatinine Baseline creatinine of 0.9.  Creatinine trending down 1.03 - 1.08 - 1.22 - 1.18 - 1.52.  She had about 1100 cc of output yesterday.  Pending FeNa.  Encourage more p.o. fluid intake. -Continue trending creatinine -BMP in the morning   Hypothyroidism TSH of 0.121 on 8/8.  Repeat TSH of 0.11 and free T4 of 2.04.  Will DC levothyroxine.  This may help with her blood pressure. -Continue holding levothyroxine -Will restart at a lower dose after surgery   Superficial thrombophlebitis of left cephalic and basilic vein  Swelling appears to be mild, no erythema noted -Continue ice and pain control with Tylenol   Chronic back pain -Dilaudid 4 mg every 3 hours as needed -Oxycodone 10mg  hypoxic prn -Continue zoloft   Hyperglycemia 2/2 poorly controlled type II diabetes mellitus: CBG within good range -Continue SSI for goal CBGs <180   Constipation: -Continue MiraLAX -Continue Colace   Best  practice:  Diet: carb modified, heart healthy Pain/Anxiety/Delirium protocol (if indicated): oxycodone, Dilaudid VAP protocol (if indicated): none DVT prophylaxis: SCDs GI prophylaxis: pepcid  Glucose control: SSI Mobility: As tolerated Code Status: FULL  Family Communication: Update son  Disposition: ICU for Cleviprex drip  Critical care times: 20 minutes   Gaylan Gerold, DO Internal Medicine Residency My pager: [phone]

## 2019-09-09 NOTE — Transfer of Care (Signed)
Immediate Anesthesia Transfer of Care Note  Patient: [name]  Procedure(s) Performed: THORACIC AORTIC ENDOVASCULAR STENT GRAFT WITH LEFT ILIAC CONDUIT (N/A ) ULTRASOUND GUIDANCE FOR VASCULAR ACCESS (Left Groin) INSERTION OF LEFT ILIAC STENT (Left ) LEFT ILIAC ENDARTERECTOMY (Left )  Patient Location: PACU  Anesthesia Type:General  Level of Consciousness: drowsy  Airway & Oxygen Therapy: Patient Spontanous Breathing and on NRB O2 mask. Duo neb treatment started per Dr. Ola Spurr.   Post-op Assessment: Report given to RN and Post -op Vital signs reviewed and stable  Post vital signs: Reviewed and stable  Last Vitals:  Vitals Value Taken Time  BP 113/73 09/09/19 1446  Temp    Pulse 80 09/09/19 1448  Resp 20 09/09/19 1448  SpO2 93 % 09/09/19 1448  Vitals shown include unvalidated device data.  Last Pain:  Vitals:   09/09/19 0830  TempSrc:   PainSc: 4       Patients Stated Pain Goal: 3 (75/79/72 8206)  Complications: No complications documented.

## 2019-09-09 NOTE — Progress Notes (Signed)
  Day of Surgery Note    Subjective:  Significant post-op pain. Being medicated.   Vitals:   09/09/19 1500 09/09/19 1515  BP: (!) 118/59 119/62  Pulse: 78 79  Resp: 16 (!) 22  Temp:    SpO2: 93% 94%    Incisions:   Left lower abd and left groin puncture sites x 2 well approximated, no bleeding or hematoma  Extremities:  Moves all well. Feet warm with intact motor and sensation Cardiac:  RRR Lungs:  nonlabored Abdomen:  Soft, ND Clear yellow urine in Foley collection bag  Assessment/Plan:  This is a 69 y.o. female who is s/p TEVAR with left iliac artery conduit and left EIA stent placement. Hemodynamically stable. No neuro deficits.  Risa Grill, PA-C 09/09/2019 3:22 PM [phone]

## 2019-09-10 LAB — BASIC METABOLIC PANEL
Anion gap: 10 (ref 5–15)
BUN: 21 mg/dL (ref 8–23)
CO2: 25 mmol/L (ref 22–32)
Calcium: 9.2 mg/dL (ref 8.9–10.3)
Chloride: 99 mmol/L (ref 98–111)
Creatinine, Ser: 1.51 mg/dL — ABNORMAL HIGH (ref 0.44–1.00)
GFR calc Af Amer: 40 mL/min — ABNORMAL LOW (ref >60)
GFR calc non Af Amer: 35 mL/min — ABNORMAL LOW (ref >60)
Glucose, Bld: 147 mg/dL — ABNORMAL HIGH (ref 70–99)
Potassium: 4.3 mmol/L (ref 3.5–5.1)
Sodium: 134 mmol/L — ABNORMAL LOW (ref 135–145)

## 2019-09-10 LAB — GLUCOSE, CAPILLARY
Glucose-Capillary: 139 mg/dL — ABNORMAL HIGH (ref 70–99)
Glucose-Capillary: 146 mg/dL — ABNORMAL HIGH (ref 70–99)
Glucose-Capillary: 149 mg/dL — ABNORMAL HIGH (ref 70–99)
Glucose-Capillary: 89 mg/dL (ref 70–99)
Glucose-Capillary: 153 mg/dL — ABNORMAL HIGH (ref 70–99)

## 2019-09-10 LAB — CBC
HCT: 28.3 % — ABNORMAL LOW (ref 36.0–46.0)
Hemoglobin: 8.5 g/dL — ABNORMAL LOW (ref 12.0–15.0)
MCH: 24.4 pg — ABNORMAL LOW (ref 26.0–34.0)
MCHC: 30 g/dL (ref 30.0–36.0)
MCV: 81.3 fL (ref 80.0–100.0)
Platelets: 350 10*3/uL (ref 150–400)
RBC: 3.48 MIL/uL — ABNORMAL LOW (ref 3.87–5.11)
RDW: 20.9 % — ABNORMAL HIGH (ref 11.5–15.5)
WBC: 17.4 10*3/uL — ABNORMAL HIGH (ref 4.0–10.5)
nRBC: 0 % (ref 0.0–0.2)

## 2019-09-10 MED ORDER — LEVOTHYROXINE SODIUM 25 MCG PO TABS
25.0000 ug | ORAL_TABLET | Freq: Every day | ORAL | Status: DC
  Administered 2019-09-10 – 2019-09-19 (×10): 25 ug via ORAL
  Filled 2019-09-10 (×10): qty 1

## 2019-09-10 MED ORDER — GABAPENTIN 100 MG PO CAPS
100.0000 mg | ORAL_CAPSULE | Freq: Three times a day (TID) | ORAL | Status: DC
  Administered 2019-09-10 – 2019-09-14 (×14): 100 mg via ORAL
  Filled 2019-09-10 (×15): qty 1

## 2019-09-10 MED ORDER — CLONIDINE HCL 0.2 MG PO TABS
0.3000 mg | ORAL_TABLET | Freq: Four times a day (QID) | ORAL | Status: DC
  Administered 2019-09-10 – 2019-09-11 (×4): 0.3 mg via ORAL
  Filled 2019-09-10 (×4): qty 1

## 2019-09-10 MED ORDER — CLONIDINE HCL 0.2 MG PO TABS: 0.3000 mg | ORAL_TABLET | Freq: Three times a day (TID) | ORAL | Status: DC

## 2019-09-10 MED ORDER — LABETALOL HCL 300 MG PO TABS
600.0000 mg | ORAL_TABLET | Freq: Three times a day (TID) | ORAL | Status: DC
  Administered 2019-09-10 – 2019-09-11 (×4): 600 mg via ORAL
  Filled 2019-09-10 (×4): qty 2

## 2019-09-10 NOTE — Progress Notes (Signed)
   VASCULAR SURGERY ASSESSMENT & PLAN:   POD 1 -ENDOVASCULAR REPAIR THORACIC AORTIC ULCER: Patient is doing well.  Her pain is under better control on a PCA pump.  We will advance her diet slowly.  Her blood pressure is under much better control.  We will start Plavix on Sunday.  SUBJECTIVE:   Complains of gas.  No nausea.  She does state that she is hungry.  PHYSICAL EXAM:   Vitals:   09/10/19 0410 09/10/19 0500 09/10/19 0600 09/10/19 0645  BP:  121/62 134/68   Pulse:  63 68   Resp: 14 20 18    Temp:    98.2 F (36.8 C)  TempSrc:    Axillary  SpO2: 94% 96% 93%   Weight:  69.7 kg    Height:       Palpable left dorsalis pedis pulse. Her left groin site looks fine without hematoma. Her left retroperitoneal incision looks fine. NEURO: No focal weakness or paresthesias.  LABS:   Lab Results  Component Value Date   WBC 17.4 (H) 09/10/2019   HGB 8.5 (L) 09/10/2019   HCT 28.3 (L) 09/10/2019   MCV 81.3 09/10/2019   PLT 350 09/10/2019   Lab Results  Component Value Date   CREATININE 1.51 (H) 09/10/2019   Lab Results  Component Value Date   INR 1.2 09/09/2019   CBG (last 3)  Recent Labs    09/09/19 1742 09/09/19 2204 09/10/19 0652  GLUCAP 148* 125* 139*    PROBLEM LIST:    Active Problems:   Hypertension associated with diabetes ([place])   Intramural aortic hematoma ([facility])   Aortic aneurysm ([facility])   Dissection of thoracic aorta ([facility])   Hypoxia   CURRENT MEDS:   . chlorhexidine  15 mL Mouth/Throat NOW  . Chlorhexidine Gluconate Cloth  6 each Topical Daily  . chlorthalidone  25 mg Oral Daily  . cholecalciferol  1,000 Units Oral q morning - 10a  . cloNIDine  0.2 mg Oral TID  . famotidine  20 mg Oral Daily  . hydrALAZINE  100 mg Oral Q8H  . HYDROmorphone   Intravenous Q4H  . insulin aspart  0-15 Units Subcutaneous TID WC  . insulin aspart  0-5 Units Subcutaneous QHS  . labetalol  400 mg Oral TID  . melatonin  9 mg Oral QHS  . minoxidil  10 mg Oral Daily   . nicotine  7 mg Transdermal Daily  . NIFEdipine  90 mg Oral Daily  . polyethylene glycol  17 g Oral BID  . pravastatin  20 mg Oral q1800  . sertraline  50 mg Oral QHS  . spironolactone  50 mg Oral Daily    [name] Office: [phone] 09/10/2019

## 2019-09-10 NOTE — Progress Notes (Signed)
NAME:  [name], MRN:  [id number], DOB:  [birth year], LOS: 75 ADMISSION DATE:  08/28/2019, CONSULTATION DATE:  08/28/2019 REFERRING MD:  ED Provider, CHIEF COMPLAINT:  Abdominal pain 2/2 descending aortic aneurysm    Brief History   69 year old female with PMHx of hypertension, tobacco use disorder, atrial fibrillation, hypothyroidism presented to [facility] with abdominal pain for 2 days. She was found to have a descending thoracic aortic aneurysm and was transferred to [facility] for further management.   Past Medical History   Past Medical History:  Diagnosis Date  . Anemia   . Anxiety   . Arthritis   . Atrial fib/flutter, transient   . COPD (chronic obstructive pulmonary disease) ([place])   . Depression   . Diabetes mellitus   . Diverticulosis   . DJD (degenerative joint disease)   . GERD (gastroesophageal reflux disease)   . Gout   . History of pneumonia   . Hypertension   . Hypothyroidism   . Internal hemorrhoids    Significant Hospital Events   8/8 transferred from [facility] 8/20   Consults:  Vascular surgery  Procedures:  8/20 :   #1: Endovascular repair of thoracic aortic ulcer/aneurysm without coverage of left subclavian                         #2: Left external iliac conduit (10 mm dacryon)                         #3: Left external iliac artery endarterectomy                         #4: Thoracic and abdominal aortogram                         #5: Radiology supervision interpretation                         #6: Ultrasound-guided access, left common femoral artery                         #7: Left external iliac stent (10x60)                         #8: Closure device, Mynx        Significant Diagnostic Tests:   CT angio chest/abd/pel 8/08 >> acute appearing intramural hematoma of distal aortic arch and descending thoracic aorta originating at or distal to Lt [place] artery measuring 3.7 x 3.5 cm and extends to level of Rt renal artery mild centrilobular emphysema, scarring at lung  bases  Echo 8/9 >> LVEF 65-70% without RWMA; grade I diastolic dyusfunction; trivial mitral regurgitation   CT angio chest/abd/pel 8/11 >> stable appearance of acute intramural hematoma arising distal to the takeoff of the left subclavian artery and terminating at the level of the proximal abdominal aorta; previously demonstrated possible penetrating ulcer arising from the descending thoracic aorta no longer well appreciated and likely resolved in the interim; bilateral pleural effusions with mild interlobular septal thickening suggestive of volume overload  LUE Vascular US 8/13 >> No DVT. Acute superficial vein thrombosis of L basilic vein and L cephalic vein; acute occlusive superficial thrombophlebitis in mid basilic vein and cephalic vein   CTA chest 8/14 >> stable known intramural thoracic aortic  hematoma with stable aneurysm dilation of the ascending thoracic aorta measuring 4 cm in AP diameter.  Slight worsening of small simple left pleural effusion with associated by basilar atelectasis.  Slightly nodular component to some of this posterior dependent density over the mid lung as cannot exclude superimposed infection   Micro Data:  COVID 8/8 > negative MRSA 8/8 > negative  Antimicrobials:  None   Interim history/subjective:  No events. Still c/o diffuse body pain.  Per vascular note this is better than usual. On PCA.  Objective   Blood pressure 133/70, pulse 72, temperature 98.6 F (37 C), temperature source Oral, resp. rate (!) 23, height 5' (1.524 m), weight 69.7 kg, SpO2 (!) 89 %.        Intake/Output Summary (Last 24 hours) at 09/10/2019 0946 Last data filed at 09/10/2019 6283 Gross per 24 hour  Intake 2097.2 ml  Output 815 ml  Net 1282.2 ml   Filed Weights   09/09/19 0645 09/09/19 0945 09/10/19 0500  Weight: 58.7 kg 58.7 kg 69.7 kg   GEN: no acute distress lying in bed HEENT: [place] and EtCO2 in place, MM dry CV: RRR, ext warm PULM: shallow inspiratory effort causing  decreased breath sounds at bases GI: soft, hypoactive BS EXT: no edema NEURO: moves all 4 ext to command PSYCH: anxious SKIN: no rashes  Stable Cr Na a bit lower WBC slightly up post op H/H and plts look fine  Assessment & Plan:  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 CTA 8/14 shows stable known intramural thoracic aortic hematoma with stable aneurysm dilation of the ascending thoracic aorta measuring 4 cm in AP diameter.  Surgery today.  Continue to control hypertension, goal systolic blood pressures < 120.    - Wean cleviprex as able -Continue nifedipine 90 mg daily, minoxidil 10mg  daily, hydralazine 100 mg p.o. 3 times daily daily, chlorthalidone 25 mg daily, spironolactone 50 mg daily - Increase clonidine to 0.3 mg 4 times daily from 0.2 TID, increase from labetalol 400 to 600 mg p.o. 3 times daily, - Consider outpatient eval of resistant hypertension. - Avoid lisinopril and amlodipine due to history of angioedema  Acute hypoxic respiratory failure requiring nasal cannula Patient is satting at 93% on 5 L nasal cannula.  Attempt to wean down to 4 L but her sat dropped in the 86%.  We will continue 5 L of nasal cannula.  Chest x-ray shows emphysematous change likely COPD related given history of smoking.  Patient has episode of the satting to the mid 80s when she is sleep, suspected underlying sleep apnea. -Continue 4 L nasal cannula.  Wean as tolerated - Add IS  AKI-  stable, watch for now and avoid nephrotoxic agents  Hypothyroidism- TSH mildly low but in setting of critical illness; thyroid hormone not ideal with severe refractory hypertension - Restart synthroid at 47mcg/day, would check TSH in a week or so  Chronic back pain- complicated hypertension -On PCA - Trial of gabapentin, titrate up - Need to restart home pain meds at some point and wean off PCA -Continue zoloft   Hyperglycemia 2/2 poorly controlled type  II diabetes mellitus: CBG within good range -Continue SSI for goal CBGs <180  Constipation: -Continue MiraLAX -Continue Colace   Best practice:  Diet: carb modified, heart healthy Pain/Anxiety/Delirium protocol (if indicated): PCA VAP protocol (if indicated): none DVT prophylaxis: SCDs GI prophylaxis: pepcid  Glucose control: SSI Mobility: As tolerated Code Status: FULL  Family Communication: updated patient Disposition: ICU for Cleviprex drip    The patient is critically ill with multiple organ systems failure and requires high complexity decision making for assessment and support, frequent evaluation and titration of therapies, application of advanced monitoring technologies and extensive interpretation of multiple databases. Critical Care Time devoted to patient care services described in this note independent of APP/resident time (if applicable)  is 31 minutes.   Erskine Emery MD [facility] 09/10/2019 10:07 AM Personal pager: [phone] If unanswered, please page CCM On-call: [phone]

## 2019-09-11 ENCOUNTER — Inpatient Hospital Stay (HOSPITAL_COMMUNITY): Payer: Medicare Other | Admitting: Certified Registered"

## 2019-09-11 ENCOUNTER — Inpatient Hospital Stay (HOSPITAL_COMMUNITY): Payer: Medicare Other | Admitting: Anesthesiology

## 2019-09-11 ENCOUNTER — Encounter (HOSPITAL_COMMUNITY): Payer: Self-pay | Admitting: Certified Registered"

## 2019-09-11 LAB — CBC
HCT: 25.4 % — ABNORMAL LOW (ref 36.0–46.0)
Hemoglobin: 7.9 g/dL — ABNORMAL LOW (ref 12.0–15.0)
MCH: 25 pg — ABNORMAL LOW (ref 26.0–34.0)
MCHC: 31.1 g/dL (ref 30.0–36.0)
MCV: 80.4 fL (ref 80.0–100.0)
Platelets: 335 10*3/uL (ref 150–400)
RBC: 3.16 MIL/uL — ABNORMAL LOW (ref 3.87–5.11)
RDW: 20.4 % — ABNORMAL HIGH (ref 11.5–15.5)
WBC: 16.5 10*3/uL — ABNORMAL HIGH (ref 4.0–10.5)
nRBC: 0 % (ref 0.0–0.2)

## 2019-09-11 LAB — BASIC METABOLIC PANEL
Anion gap: 11 (ref 5–15)
BUN: 22 mg/dL (ref 8–23)
CO2: 24 mmol/L (ref 22–32)
Calcium: 8.9 mg/dL (ref 8.9–10.3)
Chloride: 96 mmol/L — ABNORMAL LOW (ref 98–111)
Creatinine, Ser: 1.42 mg/dL — ABNORMAL HIGH (ref 0.44–1.00)
GFR calc Af Amer: 44 mL/min — ABNORMAL LOW (ref >60)
GFR calc non Af Amer: 38 mL/min — ABNORMAL LOW (ref >60)
Glucose, Bld: 145 mg/dL — ABNORMAL HIGH (ref 70–99)
Potassium: 4.3 mmol/L (ref 3.5–5.1)
Sodium: 131 mmol/L — ABNORMAL LOW (ref 135–145)

## 2019-09-11 LAB — GLUCOSE, CAPILLARY
Glucose-Capillary: 145 mg/dL — ABNORMAL HIGH (ref 70–99)
Glucose-Capillary: 111 mg/dL — ABNORMAL HIGH (ref 70–99)
Glucose-Capillary: 154 mg/dL — ABNORMAL HIGH (ref 70–99)
Glucose-Capillary: 145 mg/dL — ABNORMAL HIGH (ref 70–99)

## 2019-09-11 MED ORDER — CEFAZOLIN SODIUM-DEXTROSE 2-4 GM/100ML-% IV SOLN
2.0000 g | Freq: Three times a day (TID) | INTRAVENOUS | Status: AC
  Administered 2019-09-11 (×2): 2 g via INTRAVENOUS
  Filled 2019-09-11 (×2): qty 100

## 2019-09-11 MED ORDER — PHENOL 1.4 % MT LIQD: 1.0000 | OROMUCOSAL | Status: DC | PRN

## 2019-09-11 MED ORDER — ALUM & MAG HYDROXIDE-SIMETH 200-200-20 MG/5ML PO SUSP: 15.0000 mL | ORAL | Status: DC | PRN

## 2019-09-11 MED ORDER — NOREPINEPHRINE 4 MG/250ML-% IV SOLN
0.0000 ug/min | INTRAVENOUS | Status: DC
  Administered 2019-09-11: 6 ug/min via INTRAVENOUS
  Administered 2019-09-12: 10 ug/min via INTRAVENOUS
  Filled 2019-09-11 (×4): qty 250

## 2019-09-11 MED ORDER — HYDRALAZINE HCL 20 MG/ML IJ SOLN: 5.0000 mg | INTRAMUSCULAR | Status: DC | PRN

## 2019-09-11 MED ORDER — PHENYLEPHRINE HCL-NACL 10-0.9 MG/250ML-% IV SOLN
0.0000 ug/min | INTRAVENOUS | Status: DC
Start: 2019-09-11 — End: 2019-09-11

## 2019-09-11 MED ORDER — MAGNESIUM SULFATE 2 GM/50ML IV SOLN: 2.0000 g | Freq: Every day | INTRAVENOUS | Status: DC | PRN

## 2019-09-11 MED ORDER — METOPROLOL TARTRATE 5 MG/5ML IV SOLN: 2.0000 mg | INTRAVENOUS | Status: DC | PRN

## 2019-09-11 MED ORDER — ACETAMINOPHEN 325 MG PO TABS
325.0000 mg | ORAL_TABLET | ORAL | Status: DC | PRN
  Administered 2019-09-11 – 2019-09-17 (×6): 650 mg via ORAL
  Filled 2019-09-11 (×7): qty 2

## 2019-09-11 MED ORDER — POTASSIUM CHLORIDE CRYS ER 20 MEQ PO TBCR: 20.0000 meq | EXTENDED_RELEASE_TABLET | Freq: Every day | ORAL | Status: DC | PRN

## 2019-09-11 MED ORDER — NOREPINEPHRINE 4 MG/250ML-% IV SOLN
INTRAVENOUS | Status: AC
  Filled 2019-09-11: qty 250

## 2019-09-11 MED ORDER — ACETAMINOPHEN 325 MG RE SUPP
325.0000 mg | RECTAL | Status: DC | PRN
  Filled 2019-09-11: qty 2

## 2019-09-11 MED ORDER — PANTOPRAZOLE SODIUM 40 MG PO TBEC
40.0000 mg | DELAYED_RELEASE_TABLET | Freq: Every day | ORAL | Status: DC
  Administered 2019-09-11 – 2019-09-19 (×9): 40 mg via ORAL
  Filled 2019-09-11 (×9): qty 1

## 2019-09-11 MED ORDER — GUAIFENESIN-DM 100-10 MG/5ML PO SYRP: 15.0000 mL | ORAL_SOLUTION | ORAL | Status: DC | PRN

## 2019-09-11 MED ORDER — LABETALOL HCL 5 MG/ML IV SOLN: 10.0000 mg | INTRAVENOUS | Status: DC | PRN

## 2019-09-11 MED ORDER — SODIUM CHLORIDE 0.9 % IV SOLN: 500.0000 mL | Freq: Once | INTRAVENOUS | Status: DC | PRN

## 2019-09-11 MED ORDER — ONDANSETRON HCL 4 MG/2ML IJ SOLN
4.0000 mg | Freq: Four times a day (QID) | INTRAMUSCULAR | Status: DC | PRN
  Administered 2019-09-14 – 2019-09-15 (×2): 4 mg via INTRAVENOUS
  Filled 2019-09-11 (×3): qty 2

## 2019-09-11 MED ORDER — DOCUSATE SODIUM 100 MG PO CAPS
100.0000 mg | ORAL_CAPSULE | Freq: Every day | ORAL | Status: DC
  Administered 2019-09-12 – 2019-09-17 (×6): 100 mg via ORAL
  Filled 2019-09-11 (×7): qty 1

## 2019-09-11 NOTE — Anesthesia Procedure Notes (Signed)
Lumbar Drain  Patient location during procedure: pre-op Start time: 09/11/2019 7:00 AM End time: 09/11/2019 7:15 AM Reason for block: at surgeon's request Staffing Performed: anesthesiologist  Anesthesiologist: Roderic Palau, MD Preanesthetic Checklist Completed: patient identified, IV checked, site marked, risks and benefits discussed, surgical consent, monitors and equipment checked, pre-op evaluation and timeout performed Lumbar Puncture:  Patient position: sitting Prep: DuraPrep Patient monitoring: heart rate, cardiac monitor, continuous pulse ox and blood pressure Approach: midline Location: L3-4 Injection technique: single-shot Needle Needle type: Tuohy  Needle gauge: 14 G Needle length: 9 cm Catheter type: closed end flexible Catheter size: 19 g Catheter at skin depth: 10 cm Assessment Events: cerebrospinal fluid Attempts: 1 CSF: clear Post Procedure: drain attached to lumbar drainage system, site cleaned and sterile dressing applied

## 2019-09-11 NOTE — Progress Notes (Addendum)
   VASCULAR SURGERY ASSESSMENT & PLAN:   POD 2 - ENDOVASCULAR REPAIR THORACIC AORTIC ANEURYSM: Nurses checked on patient at 6:20 AM and she stated that she was unable to move her legs and had no feeling in her legs. I just happened to be coming to make rounds. Discussed with Dr. Ola Spurr and will proceed with urgent placement of spinal drain. She is not on any anticoagulants. Also updated son Clifton James [phone]).   ADDENDUM:   7:30 AM: moving left foot some now.  I will discuss with critical care medicine and have them keep her blood pressure higher.  9:30 AM: Now moving both LE's  SUBJECTIVE:   Unable to move legs.   PHYSICAL EXAM:   Vitals:   09/11/19 0415 09/11/19 0430 09/11/19 0445 09/11/19 0500  BP:    (!) 115/48  Pulse: 79 81 81 84  Resp: 16 16 (!) 22 17  Temp:      TempSrc:      SpO2: 96% 95% 94% 97%  Weight:    68.4 kg  Height:       Unable to move legs.  No sensation both legs.  Palpable pedal pulses.   LABS:   Lab Results  Component Value Date   WBC 16.5 (H) 09/11/2019   HGB 7.9 (L) 09/11/2019   HCT 25.4 (L) 09/11/2019   MCV 80.4 09/11/2019   PLT 335 09/11/2019   Lab Results  Component Value Date   CREATININE 1.42 (H) 09/11/2019   Lab Results  Component Value Date   INR 1.2 09/09/2019   CBG (last 3)  Recent Labs    09/10/19 1158 09/10/19 1554 09/10/19 2131  GLUCAP 149* 89 153*    PROBLEM LIST:    Active Problems:   Hypertension associated with diabetes ([place])   Intramural aortic hematoma ([facility])   Aortic aneurysm ([facility])   Dissection of thoracic aorta ([facility])   Hypoxia   CURRENT MEDS:   . Chlorhexidine Gluconate Cloth  6 each Topical Daily  . chlorthalidone  25 mg Oral Daily  . cholecalciferol  1,000 Units Oral q morning - 10a  . cloNIDine  0.3 mg Oral Q6H  . famotidine  20 mg Oral Daily  . gabapentin  100 mg Oral TID  . hydrALAZINE  100 mg Oral Q8H  . HYDROmorphone   Intravenous Q4H  . insulin aspart  0-15 Units Subcutaneous TID  WC  . insulin aspart  0-5 Units Subcutaneous QHS  . labetalol  600 mg Oral TID  . levothyroxine  25 mcg Oral Q0600  . melatonin  9 mg Oral QHS  . minoxidil  10 mg Oral Daily  . nicotine  7 mg Transdermal Daily  . NIFEdipine  90 mg Oral Daily  . polyethylene glycol  17 g Oral BID  . pravastatin  20 mg Oral q1800  . sertraline  50 mg Oral QHS  . spironolactone  50 mg Oral Daily    [name] Office: [phone] 09/11/2019

## 2019-09-11 NOTE — Progress Notes (Addendum)
Patient began complaining that she is unable to feel or move her lower extremities, or void at 6:20am. Promptly notified Dr. Scot Dock. Patient was moved to short stay for drain insertion.

## 2019-09-11 NOTE — Progress Notes (Signed)
NAME:  [name], MRN:  [id number], DOB:  08-01-1950, LOS: 52 ADMISSION DATE:  08/28/2019, CONSULTATION DATE:  08/28/2019 REFERRING MD:  ED Provider, CHIEF COMPLAINT:  Abdominal pain 2/2 descending aortic aneurysm    Brief History   69 year old female with PMHx of hypertension, tobacco use disorder, atrial fibrillation, hypothyroidism presented to [facility] with abdominal pain for 2 days. She was found to have a descending thoracic aortic aneurysm and was transferred to [facility] for further management.   Past Medical History   Past Medical History:  Diagnosis Date  . Anemia   . Anxiety   . Arthritis   . Atrial fib/flutter, transient   . COPD (chronic obstructive pulmonary disease) ([place])   . Depression   . Diabetes mellitus   . Diverticulosis   . DJD (degenerative joint disease)   . GERD (gastroesophageal reflux disease)   . Gout   . History of pneumonia   . Hypertension   . Hypothyroidism   . Internal hemorrhoids    Significant Hospital Events   8/8 transferred from [facility] 8/20   Consults:  Vascular surgery  Procedures:  8/20 :   #1: Endovascular repair of thoracic aortic ulcer/aneurysm without coverage of left subclavian                         #2: Left external iliac conduit (10 mm dacryon)                         #3: Left external iliac artery endarterectomy                         #4: Thoracic and abdominal aortogram                         #5: Radiology supervision interpretation                         #6: Ultrasound-guided access, left common femoral artery                         #7: Left external iliac stent (10x60)                         #8: Closure device, Mynx      8/22 Lumbar drain placed for LE paralysis   Significant Diagnostic Tests:   CT angio chest/abd/pel 8/08 >> acute appearing intramural hematoma of distal aortic arch and descending thoracic aorta originating at or distal to Lt [place] artery measuring 3.7 x 3.5 cm and extends to level of Rt renal artery  mild centrilobular emphysema, scarring at lung bases  Echo 8/9 >> LVEF 65-70% without RWMA; grade I diastolic dyusfunction; trivial mitral regurgitation   CT angio chest/abd/pel 8/11 >> stable appearance of acute intramural hematoma arising distal to the takeoff of the left subclavian artery and terminating at the level of the proximal abdominal aorta; previously demonstrated possible penetrating ulcer arising from the descending thoracic aorta no longer well appreciated and likely resolved in the interim; bilateral pleural effusions with mild interlobular septal thickening suggestive of volume overload  LUE Vascular US 8/13 >> No DVT. Acute superficial vein thrombosis of L basilic vein and L cephalic vein; acute occlusive superficial thrombophlebitis in mid basilic vein and cephalic vein   CTA chest  8/14 >> stable known intramural thoracic aortic hematoma with stable aneurysm dilation of the ascending thoracic aorta measuring 4 cm in AP diameter.  Slight worsening of small simple left pleural effusion with associated by basilar atelectasis.  Slightly nodular component to some of this posterior dependent density over the mid lung as cannot exclude superimposed infection   Micro Data:  COVID 8/8 > negative MRSA 8/8 > negative  Antimicrobials:  None   Interim history/subjective:  Developed acute LE weakness/numbness this AM Lumbar drain emergently placed with improvement in symptoms   Objective    Today's Vitals   09/11/19 0854 09/11/19 0900 09/11/19 0902 09/11/19 0905  BP:  (!) 108/52 (!) 145/52 (!) 145/63  Pulse:  74    Resp: 15 17    Temp:      TempSrc:      SpO2: 95% 95%    Weight:      Height:      PainSc: 7       Body mass index is 29.45 kg/m.  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 Resistant hypertension- on multiple meds History of angioedema with lisinopril and amlodipine -Continue nifedipine 90 mg daily, minoxidil 10mg  daily, hydralazine 100 mg p.o. 3 times daily daily, chlorthalidone 25 mg daily, spironolactone 50 mg daily, clonidine to 0.3 mg 4 times daily from 0.2 TID, increase from labetalol 400 to 600 mg p.o. 3 times daily.  Goal SBP 140-160 due to spinal cord ischemia symptoms - Consider outpatient eval of resistant hypertension. - Avoid lisinopril and amlodipine due to history of angioedema  Spinal cord ischemia post TEVAR- rapidly improved with lumbar drain -  Avoid hypotension - Lumbar drain management per vascular surgery  Acute hypoxic respiratory failure requiring nasal cannula Patient is satting at 93% on 5 L nasal cannula.  Attempt to wean down to 4 L but her sat dropped in the 86%.  We will continue 5 L of nasal cannula.  Chest x-ray shows emphysematous change likely COPD related given history of smoking.  Patient has episode of the satting to the mid 80s when she is sleep, suspected underlying sleep apnea. -Continue 4 L nasal cannula.  Wean as tolerated - IS  AKI- stable, watch for now and avoid nephrotoxic agents  Hypothyroidism- TSH mildly low but in setting of critical illness; thyroid hormone not ideal with severe refractory hypertension - Continue synthroid at 60mcg/day, would check TSH in a week or so  Chronic back pain- complicating hypertension -On PCA - Trial of gabapentin, titrate up - Need to restart home pain meds at some point and wean off PCA -Continue zoloft   Hyperglycemia 2/2 poorly controlled type II diabetes mellitus: CBG within good range -Continue SSI for goal CBGs  <180  Constipation: -Continue MiraLAX -Continue Colace   Best practice:  Diet: carb modified, heart healthy Pain/Anxiety/Delirium protocol (if indicated): PCA VAP protocol (if indicated): none DVT prophylaxis: SCDs GI prophylaxis: pepcid  Glucose control: SSI Mobility: As tolerated Code Status: FULL  Family Communication: updated patient Disposition: ICU for neuro checks  and maintaining SBP    The patient is critically ill with multiple organ systems failure and requires high complexity decision making for assessment and support, frequent evaluation and titration of therapies, application of advanced monitoring technologies and extensive interpretation of multiple databases. Critical Care Time devoted to patient care services described in this note independent of APP/resident time (if applicable)  is 35 minutes.   Erskine Emery MD [place] [facility] 09/11/2019 7:18 AM Personal pager: [phone] If unanswered, please page CCM On-call: [phone]

## 2019-09-11 NOTE — Progress Notes (Signed)
BP still a bit low (SBP 120s), will hold clonidine for now

## 2019-09-12 ENCOUNTER — Inpatient Hospital Stay (HOSPITAL_COMMUNITY): Payer: Medicare Other

## 2019-09-12 ENCOUNTER — Encounter (HOSPITAL_COMMUNITY): Payer: Self-pay | Admitting: Surgery

## 2019-09-12 DIAGNOSIS — R579 Shock, unspecified: Secondary | ICD-10-CM

## 2019-09-12 DIAGNOSIS — Z452 Encounter for adjustment and management of vascular access device: Secondary | ICD-10-CM

## 2019-09-12 DIAGNOSIS — I952 Hypotension due to drugs: Secondary | ICD-10-CM

## 2019-09-12 LAB — CBC
HCT: 23.7 % — ABNORMAL LOW (ref 36.0–46.0)
Hemoglobin: 7.4 g/dL — ABNORMAL LOW (ref 12.0–15.0)
MCH: 24.3 pg — ABNORMAL LOW (ref 26.0–34.0)
MCHC: 31.2 g/dL (ref 30.0–36.0)
MCV: 78 fL — ABNORMAL LOW (ref 80.0–100.0)
Platelets: 356 10*3/uL (ref 150–400)
RBC: 3.04 MIL/uL — ABNORMAL LOW (ref 3.87–5.11)
RDW: 19.9 % — ABNORMAL HIGH (ref 11.5–15.5)
WBC: 15.1 10*3/uL — ABNORMAL HIGH (ref 4.0–10.5)
nRBC: 0 % (ref 0.0–0.2)

## 2019-09-12 LAB — BASIC METABOLIC PANEL
Anion gap: 13 (ref 5–15)
BUN: 27 mg/dL — ABNORMAL HIGH (ref 8–23)
CO2: 20 mmol/L — ABNORMAL LOW (ref 22–32)
Calcium: 8.6 mg/dL — ABNORMAL LOW (ref 8.9–10.3)
Chloride: 97 mmol/L — ABNORMAL LOW (ref 98–111)
Creatinine, Ser: 1.54 mg/dL — ABNORMAL HIGH (ref 0.44–1.00)
GFR calc Af Amer: 39 mL/min — ABNORMAL LOW (ref >60)
GFR calc non Af Amer: 34 mL/min — ABNORMAL LOW (ref >60)
Glucose, Bld: 153 mg/dL — ABNORMAL HIGH (ref 70–99)
Potassium: 4 mmol/L (ref 3.5–5.1)
Sodium: 130 mmol/L — ABNORMAL LOW (ref 135–145)

## 2019-09-12 LAB — GLUCOSE, CAPILLARY
Glucose-Capillary: 152 mg/dL — ABNORMAL HIGH (ref 70–99)
Glucose-Capillary: 147 mg/dL — ABNORMAL HIGH (ref 70–99)
Glucose-Capillary: 159 mg/dL — ABNORMAL HIGH (ref 70–99)
Glucose-Capillary: 158 mg/dL — ABNORMAL HIGH (ref 70–99)

## 2019-09-12 MED ORDER — NOREPINEPHRINE 16 MG/250ML-% IV SOLN
0.0000 ug/min | INTRAVENOUS | Status: DC
  Administered 2019-09-12: 8 ug/min via INTRAVENOUS
  Filled 2019-09-12: qty 250

## 2019-09-12 MED ORDER — SODIUM CHLORIDE 0.9 % IV BOLUS
500.0000 mL | Freq: Once | INTRAVENOUS | Status: AC
  Administered 2019-09-12: 500 mL via INTRAVENOUS

## 2019-09-12 MED ORDER — VASOPRESSIN 20 UNITS/100 ML INFUSION FOR SHOCK
0.0000 [IU]/min | INTRAVENOUS | Status: DC
  Administered 2019-09-12 (×2): 0.03 [IU]/min via INTRAVENOUS
  Filled 2019-09-12 (×4): qty 100

## 2019-09-12 MED ORDER — SODIUM CHLORIDE 0.9 % IV BOLUS: 250.0000 mL | Freq: Once | INTRAVENOUS | Status: DC

## 2019-09-12 NOTE — Progress Notes (Signed)
NAME:  [name], MRN:  [id number], DOB:  05-12-1950, LOS: 43 ADMISSION DATE:  08/28/2019, CONSULTATION DATE:  08/28/2019 REFERRING MD:  ED Provider, CHIEF COMPLAINT:  Abdominal pain 2/2 descending aortic aneurysm    Brief History   69 year old female with PMHx of hypertension, tobacco use disorder, atrial fibrillation, hypothyroidism presented to [facility] with abdominal pain for 2 days. She was found to have a descending thoracic aortic aneurysm and was transferred to [facility] for further management.   Past Medical History   Past Medical History:  Diagnosis Date  . Anemia   . Anxiety   . Arthritis   . Atrial fib/flutter, transient   . COPD (chronic obstructive pulmonary disease) ([place])   . Depression   . Diabetes mellitus   . Diverticulosis   . DJD (degenerative joint disease)   . GERD (gastroesophageal reflux disease)   . Gout   . History of pneumonia   . Hypertension   . Hypothyroidism   . Internal hemorrhoids    Significant Hospital Events   8/8 transferred from [facility] 8/20 PCCM consulted for difficult to control HTN. to OR for endovascular repair.  8/22 unable to move legs. back to OR for LE paralysis  8/23 hypotensive. Antihypertensives stopped   Consults:  Vascular surgery  Procedures:  8/20 :   #1: Endovascular repair of thoracic aortic ulcer/aneurysm without coverage of left subclavian                         #2: Left external iliac conduit (10 mm dacryon)                         #3: Left external iliac artery endarterectomy                         #4: Thoracic and abdominal aortogram                         #5: Radiology supervision interpretation                         #6: Ultrasound-guided access, left common femoral artery                         #7: Left external iliac stent (10x60)                         #8: Closure device, Mynx      8/22 Lumbar drain placed for LE paralysis  8/23 right radial aline 8/23 right IJ CVL>>> Significant Diagnostic Tests:   CT  angio chest/abd/pel 8/08 >> acute appearing intramural hematoma of distal aortic arch and descending thoracic aorta originating at or distal to Lt  artery measuring 3.7 x 3.5 cm and extends to level of Rt renal artery mild centrilobular emphysema, scarring at lung bases  Echo 8/9 >> LVEF 65-70% without RWMA; grade I diastolic dyusfunction; trivial mitral regurgitation   CT angio chest/abd/pel 8/11 >> stable appearance of acute intramural hematoma arising distal to the takeoff of the left subclavian artery and terminating at the level of the proximal abdominal aorta; previously demonstrated possible penetrating ulcer arising from the descending thoracic aorta no longer well appreciated and likely resolved in the interim; bilateral pleural effusions with mild interlobular septal thickening suggestive of volume  overload  LUE Vascular US 8/13 >> No DVT. Acute superficial vein thrombosis of L basilic vein and L cephalic vein; acute occlusive superficial thrombophlebitis in mid basilic vein and cephalic vein   CTA chest 8/14 >> stable known intramural thoracic aortic hematoma with stable aneurysm dilation of the ascending thoracic aorta measuring 4 cm in AP diameter.  Slight worsening of small simple left pleural effusion with associated by basilar atelectasis.  Slightly nodular component to some of this posterior dependent density over the mid lung as cannot exclude superimposed infection   Micro Data:  COVID 8/8 > negative MRSA 8/8 > negative  Antimicrobials:  None   Interim history/subjective:   No distress. Still has some right sided weakness.    Objective    Today's Vitals   09/12/19 0600 09/12/19 0615 09/12/19 0630 09/12/19 0645  BP: (Abnormal) 89/43 (Abnormal) 94/45    Pulse: 69 65 64 64  Resp: 20 (Abnormal) 21 18 16   Temp:      TempSrc:      SpO2: 91% 90% 90% 91%  Weight:      Height:      PainSc:       Body mass index is 29.79 kg/m.  General: this is a 69 year old female  resting in bed.  HENT NCAT. Mild JVD. MMM now w/ right IJ CVL Pulm clear. Still on 5 liters no accessory use.  Card RRR w/ soft holosystolic murmur abd not tender + bowel sounds Neuro awake and alert. Right lower extremity is weak. Lumbar drain w/ clear CSF Ext warm and dry. The right upper ext is a little more swollen and painful to art puncture site.   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  Plan Holding nifedipine 90mg /d, minoxidil 10mg d, hydralazine 100mg  tid, chlorthalaidone 25mg /d, spironolactone 71mfd, clonidine 0.3 qid, and labetolol 600mg  tid. -->we will need to resume these at lower dosing as BP returns Titrate norepi for SBP 140-160 (for ischemic cord symptoms) Avoiding lisinopril and amlodipine w/ h/o angioedema)  Spinal cord ischemia post TEVAR- rapidly improved with lumbar drain Plan Avoid hypotension Lumbar drain management per vascular   Acute hypoxic respiratory failure requiring nasal cannula   Chest x-ray shows emphysematous change likely COPD related given history of smoking.  Patient has episode of the satting to the mid 80s when she is sleep, suspected underlying sleep apnea. Plan Wean oxygen Repeat CXR Scheduled BDs   AKI- slightly worse Plan Cont to titrate pressors for MAP > 65 Renal dose meds Strict I&O Serial chemistries Assess CVP  Fluid and Electrolyte imbalance Plan Trend chemistries and replace as indicated.   Leukocytosis-->improving Plan Trend cbc and fever curve  Anemia w/out evidence of bleeding Plan Trend cbc Transfuse for hgb < 7  Hypothyroidism- TSH mildly low but in setting of critical illness; thyroid hormone not ideal with severe refractory hypertension Plan Cont synthroid at 25  mcg/d TSH next week (week of 30th)  Chronic back pain- complicating hypertension Plan Cont PCA Cont gabapentin (new this admit) Cont zoloft.  Eventually resume home meds   Hyperglycemia 2/2 poorly controlled type II diabetes mellitus: cbg w/in goal Plan Cont ssi w/ cbg goal 140-180   Constipation: Plan Cont Miralax and colase    Best practice:  Diet: carb modified, heart healthy Pain/Anxiety/Delirium protocol (if indicated): PCA  VAP protocol (if indicated): none DVT prophylaxis: SCDs GI prophylaxis: pepcid  Glucose control: SSI Mobility: As tolerated Code Status: FULL  Family Communication: updated patient Disposition: ICU for neuro checks and maintaining SBP

## 2019-09-12 NOTE — Progress Notes (Addendum)
Notified Dr. Jeannene Patella ([place]) that patients SBP dropped to low 100's. Arterial line correlates with cuff pressure. Will administer bolus. Also okay to increase levophed to 12 mcg/min through peripheral line.

## 2019-09-12 NOTE — Progress Notes (Addendum)
[facility] much better this am HA is gone and she is able to move all ext.   Objective (!) 94/45 64 98.7 F (37.1 C) (Oral) 16 91%  Intake/Output Summary (Last 24 hours) at 09/12/2019 0801 Last data filed at 09/12/2019 0743 Gross per 24 hour  Intake 1654 ml  Output 1888 ml  Net -234 ml    Spinal drain in place BP goal > 707<867, systolic currently 544 while in room A & O x 3 Moving all 4 ext. Feet warm well perfused with intact sensation to light touch   Assessment/Planning: 09/09/19 TEVAR 09/11/19 replacement of spinal drain for spinal cord ischemia.  Moving all 4 ext and HA is gone. BP systolic goals 920-100 range   [name] 09/12/2019 8:01 AM --  Laboratory Lab Results: Recent Labs    09/11/19 0445 09/12/19 0322  WBC 16.5* 15.1*  HGB 7.9* 7.4*  HCT 25.4* 23.7*  PLT 335 356   BMET Recent Labs    09/11/19 0445 09/12/19 0322  NA 131* 130*  K 4.3 4.0  CL 96* 97*  CO2 24 20*  GLUCOSE 145* 153*  BUN 22 27*  CREATININE 1.42* 1.54*  CALCIUM 8.9 8.6*    COAG Lab Results  Component Value Date   INR 1.2 09/09/2019   INR 1.1 09/09/2019   INR 0.92 04/04/2010   No results found for: PTT  HA has returned.  Requiring pressors to maintain adequate SBP.  Lumbar drain still with high output.  Will keep lumbar drain in and open today and reassess tomorrow.  I will need to confirm with anesthesia regarding SQ heparin while drain is in place.  Otherwise, she will need to wear SCD's for DVT prophylaxis.  No neuro deficits this am, however she says right foot feels a little off, but there is normal motor and sensory function intact  [place] Justin Meisenheimer

## 2019-09-12 NOTE — Progress Notes (Signed)
Discussed SBP goal with Mickel Baas Gleason. Patient on 10 mcg levophed with SBP 100-110. Patient bolused 1L of NS. Dr. Scot Dock called to clarify SBP goal from vascular perspective. Okay to have SBP 120's-130's.

## 2019-09-12 NOTE — Anesthesia Postprocedure Evaluation (Signed)
Anesthesia Post Note  Patient: [name]  Procedure(s) Performed: THORACIC AORTIC ENDOVASCULAR STENT GRAFT WITH LEFT ILIAC CONDUIT (N/A ) ULTRASOUND GUIDANCE FOR VASCULAR ACCESS (Left Groin) INSERTION OF LEFT ILIAC STENT (Left ) LEFT ILIAC ENDARTERECTOMY (Left )     Patient location during evaluation: PACU Anesthesia Type: General Level of consciousness: awake and alert Pain management: pain level controlled Vital Signs Assessment: post-procedure vital signs reviewed and stable Respiratory status: spontaneous breathing, nonlabored ventilation, respiratory function stable and patient connected to nasal cannula oxygen Cardiovascular status: blood pressure returned to baseline and stable Postop Assessment: no apparent nausea or vomiting Anesthetic complications: no   No complications documented.  Last Vitals:  Vitals:   09/12/19 1745 09/12/19 1800  BP: (!) 156/65 (!) 159/65  Pulse: 75 77  Resp: 16 19  Temp:    SpO2: 93% 97%    Last Pain:  Vitals:   09/12/19 1639  TempSrc:   PainSc: 0-No pain                 Tiajuana Amass

## 2019-09-12 NOTE — Progress Notes (Addendum)
[facility] Progress Note Patient Name: [name] DOB: July 15, 1950 MRN: [id number]   Date of Service  09/12/2019  HPI/Events of Note  This is a post TEVAR , who then had spinal ischemia. Goal systolics is above 546. RN is on 10 mic of levophed via periphera line since 6 pm. Comfortable on camera with 3 liter nasal o2.    eICU Interventions  Try NS bolus x 1 Is on max dose Levophed through peripheral line  Check AM labs now Notified ground team, will need a central line so higher dose pressor can be infused      Intervention Category Major Interventions: Hypotension - evaluation and management  Hildegarde Dunaway G Acxel Dingee 09/12/2019, 2:29 AM   6.30 am SBP 98-99 No distress O2 sat 96 Responded to fluids earlier Bedside team aware, will try another small bolus Will need central line placed this AM - have discussed with bedside team

## 2019-09-12 NOTE — Procedures (Signed)
Central Venous Catheter Insertion Procedure Note  Zariah Cavendish  [id number]  03-Jun-1950  Date:09/12/19  Time:12:17 PM   Provider Performing:Pete Johnette Abraham Kary Kos   Procedure: Insertion of Non-tunneled Central Venous [phone]) with US guidance ([id number])   Indication(s) Medication administration and Difficult access  Consent Risks of the procedure as well as the alternatives and risks of each were explained to the patient and/or caregiver.  Consent for the procedure was obtained and is signed in the bedside chart  Anesthesia Topical only with 1% lidocaine   Timeout Verified patient identification, verified procedure, site/side was marked, verified correct patient position, special equipment/implants available, medications/allergies/relevant history reviewed, required imaging and test results available.  Sterile Technique Maximal sterile technique including full sterile barrier drape, hand hygiene, sterile gown, sterile gloves, mask, hair covering, sterile ultrasound probe cover (if used).  Procedure Description Area of catheter insertion was cleaned with chlorhexidine and draped in sterile fashion.  With real-time ultrasound guidance a central venous catheter was placed into the right internal jugular vein. Nonpulsatile blood flow and easy flushing noted in all ports.  The catheter was sutured in place and sterile dressing applied.  Complications/Tolerance None; patient tolerated the procedure well. Chest X-ray is ordered to verify placement for internal jugular or subclavian cannulation.   Chest x-ray is not ordered for femoral cannulation.  EBL Minimal  Specimen(s) None  Erick Colace ACNP-BC Reading Pager # [phone] OR # [phone] if no answer

## 2019-09-12 NOTE — Anesthesia Post-op Follow-up Note (Signed)
  Anesthesia Follow-up Note  Patient: [name]  Day #: 1  Date of Follow-up: 09/12/2019 Time: 11:16 AM  Last Vitals:  Vitals:   09/12/19 0841 09/12/19 0900  BP:  (!) 112/50  Pulse:  65  Resp: 15 17  Temp:    SpO2: 94% 96%    Level of Consciousness: alert  Side Effects:None  Catheter Site Exam: Clean, dry, intact. Non-tender, non-indurated.  Comments: Subjectively vast improvement from prior to drain placement yesterday. Patient happily moving all 4 extremities, denies headache at this time. No complaints.  Plan: Continue use of lumbar drain per vascular surgery request.  Audry Pili

## 2019-09-12 NOTE — Progress Notes (Signed)
Assisted tele visit to patient with family member.  Marvon Shillingburg McEachran, RN  

## 2019-09-13 ENCOUNTER — Inpatient Hospital Stay (HOSPITAL_COMMUNITY): Payer: Medicare Other

## 2019-09-13 DIAGNOSIS — G839 Paralytic syndrome, unspecified: Secondary | ICD-10-CM

## 2019-09-13 LAB — CBC
HCT: 21.3 % — ABNORMAL LOW (ref 36.0–46.0)
Hemoglobin: 6.7 g/dL — CL (ref 12.0–15.0)
MCH: 25.1 pg — ABNORMAL LOW (ref 26.0–34.0)
MCHC: 31.5 g/dL (ref 30.0–36.0)
MCV: 79.8 fL — ABNORMAL LOW (ref 80.0–100.0)
Platelets: 359 10*3/uL (ref 150–400)
RBC: 2.67 MIL/uL — ABNORMAL LOW (ref 3.87–5.11)
RDW: 20.5 % — ABNORMAL HIGH (ref 11.5–15.5)
WBC: 13.3 10*3/uL — ABNORMAL HIGH (ref 4.0–10.5)
nRBC: 0.2 % (ref 0.0–0.2)

## 2019-09-13 LAB — BASIC METABOLIC PANEL
Anion gap: 9 (ref 5–15)
Anion gap: 11 (ref 5–15)
BUN: 21 mg/dL (ref 8–23)
BUN: 20 mg/dL (ref 8–23)
CO2: 25 mmol/L (ref 22–32)
CO2: 25 mmol/L (ref 22–32)
Calcium: 9.1 mg/dL (ref 8.9–10.3)
Calcium: 9.3 mg/dL (ref 8.9–10.3)
Chloride: 100 mmol/L (ref 98–111)
Chloride: 100 mmol/L (ref 98–111)
Creatinine, Ser: 1.17 mg/dL — ABNORMAL HIGH (ref 0.44–1.00)
Creatinine, Ser: 1.05 mg/dL — ABNORMAL HIGH (ref 0.44–1.00)
GFR calc Af Amer: 55 mL/min — ABNORMAL LOW (ref >60)
GFR calc Af Amer: >60 mL/min (ref >60)
GFR calc non Af Amer: 48 mL/min — ABNORMAL LOW (ref >60)
GFR calc non Af Amer: 54 mL/min — ABNORMAL LOW (ref >60)
Glucose, Bld: 145 mg/dL — ABNORMAL HIGH (ref 70–99)
Glucose, Bld: 132 mg/dL — ABNORMAL HIGH (ref 70–99)
Potassium: 3.9 mmol/L (ref 3.5–5.1)
Potassium: 4.3 mmol/L (ref 3.5–5.1)
Sodium: 134 mmol/L — ABNORMAL LOW (ref 135–145)
Sodium: 136 mmol/L (ref 135–145)

## 2019-09-13 LAB — PREPARE RBC (CROSSMATCH)

## 2019-09-13 LAB — HEMOGLOBIN AND HEMATOCRIT, BLOOD
HCT: 30.4 % — ABNORMAL LOW (ref 36.0–46.0)
Hemoglobin: 9.4 g/dL — ABNORMAL LOW (ref 12.0–15.0)

## 2019-09-13 LAB — GLUCOSE, CAPILLARY
Glucose-Capillary: 127 mg/dL — ABNORMAL HIGH (ref 70–99)
Glucose-Capillary: 115 mg/dL — ABNORMAL HIGH (ref 70–99)
Glucose-Capillary: 160 mg/dL — ABNORMAL HIGH (ref 70–99)
Glucose-Capillary: 107 mg/dL — ABNORMAL HIGH (ref 70–99)

## 2019-09-13 MED ORDER — SODIUM CHLORIDE 0.9% IV SOLUTION: Freq: Once | INTRAVENOUS | Status: AC

## 2019-09-13 MED ORDER — FUROSEMIDE 10 MG/ML IJ SOLN
40.0000 mg | Freq: Once | INTRAMUSCULAR | Status: AC
  Administered 2019-09-13: 40 mg via INTRAVENOUS
  Filled 2019-09-13: qty 4

## 2019-09-13 NOTE — Progress Notes (Signed)
[facility] Progress Note Patient Name: [name] DOB: Dec 30, 1950 MRN: [id number]   Date of Service  09/13/2019  HPI/Events of Note  Hemoglobin 6.7, slow drop noted over last couple days. Levophed needs in fact reducing. No bleeding reported.   eICU Interventions  1 unit RBC and post transfusion CBC ordered RN to get consent     Intervention Category Major Interventions: Hemorrhage - evaluation and management  Margaretmary Lombard 09/13/2019, 4:58 AM

## 2019-09-13 NOTE — Progress Notes (Signed)
    Subjective  - POD #5  Complaining of back pain at the catheter insertion site.  She also has a headache.  She does not endorse any leg complaints today.   Physical Exam:  Normal motor and sensory function of the bilateral lower extremities.  She has palpable pedal pulses. Slight blood-tinged spinal fluid from lumbar drain      Assessment/Plan:  POD #4  I suspect her headaches are related to her lumbar drain.  I am going to Her drain with serial neurologic evaluation.  If she develops recurrent symptoms we will need to start draining her fluid again.  If she does well over the next 24 hours, this can be removed.  Continue to support her blood pressure as needed.  DVT prophylaxis is with SCDs.    [name] 09/13/2019 5:25 PM --  Vitals:   09/13/19 1618 09/13/19 1645  BP:    Pulse:    Resp: 18   Temp:  99.9 F (37.7 C)  SpO2: 97%     Intake/Output Summary (Last 24 hours) at 09/13/2019 1725 Last data filed at 09/13/2019 1600 Gross per 24 hour  Intake 1372.41 ml  Output 2397 ml  Net -1024.59 ml     Laboratory CBC    Component Value Date/Time   WBC 13.3 (H) 09/13/2019 0346   HGB 9.4 (L) 09/13/2019 1548   HCT 30.4 (L) 09/13/2019 1548   PLT 359 09/13/2019 0346    BMET    Component Value Date/Time   NA 134 (L) 09/13/2019 0346   K 3.9 09/13/2019 0346   CL 100 09/13/2019 0346   CO2 25 09/13/2019 0346   GLUCOSE 145 (H) 09/13/2019 0346   BUN 21 09/13/2019 0346   CREATININE 1.17 (H) 09/13/2019 0346   CALCIUM 9.1 09/13/2019 0346   GFRNONAA 48 (L) 09/13/2019 0346   GFRAA 55 (L) 09/13/2019 0346    COAG Lab Results  Component Value Date   INR 1.2 09/09/2019   INR 1.1 09/09/2019   INR 0.92 04/04/2010   No results found for: PTT  Antibiotics Anti-infectives (From admission, onward)   Start     Dose/Rate Route Frequency Ordered Stop   09/11/19 1000  ceFAZolin (ANCEF) IVPB 2g/100 mL premix        2 g 200 mL/hr over 30 Minutes Intravenous Every 8  hours 09/11/19 0838 09/11/19 1845   09/09/19 0000  vancomycin (VANCOCIN) IVPB 1000 mg/200 mL premix        1,000 mg 200 mL/hr over 60 Minutes Intravenous To Surgery 09/08/19 1038 09/10/19 0000       V. Leia Alf, M.D., [facility] [place] Office: [phone] Pager:  [phone]

## 2019-09-13 NOTE — Progress Notes (Signed)
While assessing patient, RN noticed IV tubing bunched up behind the patients shoulder. While readjusting the tubing RN noticed the metal connection to the lumbar drain exposed. Clear fluid was witnessed dripping out of the white tubing. Within seconds the RN pinched off the white tubing and anesthesia was contacted. Between the disconnection and pinching off the tubing the patient complained of headache pain. The pain resolved after tubing was clamped for >42min. Anesthesia at bedside and vascular made aware of situation. Anesthesia removed lumbar drain from patients back.

## 2019-09-13 NOTE — Progress Notes (Signed)
NAME:  [name], MRN:  [id number], DOB:  [birth year], LOS: 75 ADMISSION DATE:  08/28/2019, CONSULTATION DATE:  08/28/2019 REFERRING MD:  ED Provider, CHIEF COMPLAINT:  Abdominal pain 2/2 descending aortic aneurysm    Brief History   69 year old female with PMHx of hypertension, tobacco use disorder, atrial fibrillation, hypothyroidism presented to [facility] with abdominal pain for 2 days. She was found to have a descending thoracic aortic aneurysm and was transferred to [facility] for further management.   Past Medical History   Past Medical History:  Diagnosis Date  . Anemia   . Anxiety   . Arthritis   . Atrial fib/flutter, transient   . COPD (chronic obstructive pulmonary disease) ([place])   . Depression   . Diabetes mellitus   . Diverticulosis   . DJD (degenerative joint disease)   . GERD (gastroesophageal reflux disease)   . Gout   . History of pneumonia   . Hypertension   . Hypothyroidism   . Internal hemorrhoids    Significant Hospital Events   8/8 transferred from [facility] 8/20 PCCM consulted for difficult to control HTN. to OR for endovascular repair.  8/22 unable to move legs. back to OR for LE paralysis  8/23 hypotensive. Antihypertensives stopped   Consults:  Vascular surgery  Procedures:  8/20 :   #1: Endovascular repair of thoracic aortic ulcer/aneurysm without coverage of left subclavian                         #2: Left external iliac conduit (10 mm dacryon)                         #3: Left external iliac artery endarterectomy                         #4: Thoracic and abdominal aortogram                         #5: Radiology supervision interpretation                         #6: Ultrasound-guided access, left common femoral artery                         #7: Left external iliac stent (10x60)                         #8: Closure device, Mynx      8/22 Lumbar drain placed for LE paralysis  8/23 right radial aline 8/23 right IJ CVL>>> Significant Diagnostic Tests:   CT  angio chest/abd/pel 8/08 >> acute appearing intramural hematoma of distal aortic arch and descending thoracic aorta originating at or distal to Lt [place] artery measuring 3.7 x 3.5 cm and extends to level of Rt renal artery mild centrilobular emphysema, scarring at lung bases  Echo 8/9 >> LVEF 65-70% without RWMA; grade I diastolic dyusfunction; trivial mitral regurgitation   CT angio chest/abd/pel 8/11 >> stable appearance of acute intramural hematoma arising distal to the takeoff of the left subclavian artery and terminating at the level of the proximal abdominal aorta; previously demonstrated possible penetrating ulcer arising from the descending thoracic aorta no longer well appreciated and likely resolved in the interim; bilateral pleural effusions with mild interlobular septal thickening suggestive of volume  overload  LUE Vascular US 8/13 >> No DVT. Acute superficial vein thrombosis of L basilic vein and L cephalic vein; acute occlusive superficial thrombophlebitis in mid basilic vein and cephalic vein   CTA chest 8/14 >> stable known intramural thoracic aortic hematoma with stable aneurysm dilation of the ascending thoracic aorta measuring 4 cm in AP diameter.  Slight worsening of small simple left pleural effusion with associated by basilar atelectasis.  Slightly nodular component to some of this posterior dependent density over the mid lung as cannot exclude superimposed infection   Micro Data:  COVID 8/8 > negative MRSA 8/8 > negative  Antimicrobials:  None   Interim history/subjective:  Complains of back pain.  No other acute complaints.  Her strength has normalized   Objective    Today's Vitals   09/13/19 0636 09/13/19 0700 09/13/19 0805 09/13/19 0806  BP: (Abnormal) 137/59 (Abnormal) 144/67    Pulse: 74 68    Resp: 20 18  18   Temp: 99.1 F (37.3 C)  99.2 F (37.3 C)   TempSrc:   Oral   SpO2:  95%  90%  Weight:      Height:      PainSc:    0-No pain  CVP:  [14 mmHg-20  mmHg] 14 mmHg Body mass index is 30.01 kg/m.  General this is a pleasant 69 year old black female she is resting in bed currently in no acute distress HEENT normocephalic atraumatic no jugular venous distention is appreciated Pulmonary: No accessory use, supplemental oxygen requirements had increased up to 6 L.  Does exhibit some basilar rales bilaterally Cardiac systolic murmur, nonradiating.  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  -> We are titrating vasoactive drips down -> CVP suggests at least euvolemic if not a little bit volume overloaded Plan Discontinue vasopressin, continue to titrate norepinephrine for systolic blood pressure goal 140-160, she has had significant improvement in her symptoms once this goal was obtained  Holding her nifedipine 90 mg a day, minoxidil 10 mg a day, hydralazine 100 mg 3 times a day, chlorthalidone 25 mg a day, spironolactone 50 mg a day, clonidine 0.3 mg 3 times a day, and labetalol 600 mg 3 times a day' Avoiding lisinopril and amlodipine altogether due to history of angioedema We will continue to watch closely, anticipate will need to slowly add back her antihypertensives in the near future.    Spinal cord ischemia post TEVAR- rapidly improved with lumbar drain Plan Avoid hypotension Lumbar drain per vascular   Acute hypoxic respiratory failure requiring nasal cannula PCXR personally reviewed: new right > left airspace disease. Looks like a mix of asymmetric edema and atelectasis   Plan Cont to wean oxygen Mobilize when able Pulse ox IS Daily assessment for diuretics, will hold off for today    AKI- scr normalizing  Plan Ensure euvolemia Avoid hypotension AND hypertension Renal dose meds Strict I&O Am chemistry  Fluid and Electrolyte imbalance Plan Trend and replace as indicated  Leukocytosis-->improving Plan Cont to trend cbc and fever curve  Anemia w/out evidence of bleeding-->hgb did drop to 6.7 8/24 Plan Transfuse 1 unit  Repeat cbc am  Hypothyroidism- TSH mildly low but in setting of critical illness; thyroid hormone not ideal with severe refractory hypertension Plan Cont synthroid at 25 mcg/d-->TSH week of 30th   Chronic back pain- complicating hypertension Plan Cont PCA Cont Gabapentin -->this is new med  Cont zoloft  Eventually resume her home meds    Hyperglycemia 2/2 poorly controlled type II diabetes mellitus: cbg w/in goal Plan Cont ssi w/ cbg 140-180  Constipation: Plan Cont Miralax and Colase      Best practice:  Diet: carb modified, heart healthy Pain/Anxiety/Delirium protocol (if indicated): PCA VAP protocol (if indicated): none DVT prophylaxis: SCDs GI prophylaxis: pepcid  Glucose control: SSI Mobility: As tolerated Code Status: FULL  Family Communication: updated patient Disposition: ICU for neuro checks and maintaining SBP    My cct 32 minutes  Erick Colace ACNP-BC [place] Pager # [phone] OR # [phone] if no answer

## 2019-09-13 NOTE — Anesthesia Post-op Follow-up Note (Signed)
  Anesthesia Lumbar Drain Follow-up Note  Patient: [name]  Day #: 2  Date of Follow-up: 09/13/2019 Time: 10:26 AM  Last Vitals:  Vitals:   09/13/19 0805 09/13/19 0806  BP:    Pulse:    Resp:  18  Temp: 37.3 C   SpO2:  90%    Level of Consciousness: alert  Pain: mild   Side Effects:None  Catheter Site Exam:clean, dry, no drainage Some pink tinge in the CSF    Plan: Continue present management Recommend no heparin while catheter is in place.  Can start once removed.  Kymani Shimabukuro DANIEL

## 2019-09-13 NOTE — Anesthesia Post-op Follow-up Note (Signed)
  Anesthesia Pain Follow-up Note  Patient: [name]  Day #: 2  Date of Follow-up: 09/13/2019 Time: 10:39 PM  Last Vitals:  Vitals:   09/13/19 2053 09/13/19 2100  BP:  (!) 177/74  Pulse:  86  Resp: 20 (!) 22  Temp:    SpO2: 98% 97%    Level of Consciousness: alert  Pain: mild   Side Effects:None  Catheter Site Exam:clean, dry, no drainage     Plan: Catheter removed/tip intact at surgeon's request and D/C from anesthesia care at surgeon's request. The drain tubing became separated from the connection piece and CSF was draining into the bed. With an increased risk for infection, after discussion with Dr. Trula Slade, the decision was made to remove the spinal drain. The patient had regained movement to the lower extremities and was c/o headaches due to CSF drainage. If neurologic deficits were to return, we could place another drain at that time. The patient has not been on any anticoagulants.  Danikah Budzik,W. EDMOND

## 2019-09-14 DIAGNOSIS — K5903 Drug induced constipation: Secondary | ICD-10-CM

## 2019-09-14 DIAGNOSIS — I161 Hypertensive emergency: Secondary | ICD-10-CM

## 2019-09-14 LAB — BASIC METABOLIC PANEL
Anion gap: 9 (ref 5–15)
BUN: 16 mg/dL (ref 8–23)
CO2: 25 mmol/L (ref 22–32)
Calcium: 9.2 mg/dL (ref 8.9–10.3)
Chloride: 102 mmol/L (ref 98–111)
Creatinine, Ser: 0.99 mg/dL (ref 0.44–1.00)
GFR calc Af Amer: >60 mL/min (ref >60)
GFR calc non Af Amer: 58 mL/min — ABNORMAL LOW (ref >60)
Glucose, Bld: 121 mg/dL — ABNORMAL HIGH (ref 70–99)
Potassium: 3.7 mmol/L (ref 3.5–5.1)
Sodium: 136 mmol/L (ref 135–145)

## 2019-09-14 LAB — TYPE AND SCREEN
ABO/RH(D): O NEG
Antibody Screen: NEGATIVE
Unit division: 0

## 2019-09-14 LAB — CBC
HCT: 29.5 % — ABNORMAL LOW (ref 36.0–46.0)
Hemoglobin: 9.6 g/dL — ABNORMAL LOW (ref 12.0–15.0)
MCH: 26.8 pg (ref 26.0–34.0)
MCHC: 32.5 g/dL (ref 30.0–36.0)
MCV: 82.4 fL (ref 80.0–100.0)
Platelets: 445 10*3/uL — ABNORMAL HIGH (ref 150–400)
RBC: 3.58 MIL/uL — ABNORMAL LOW (ref 3.87–5.11)
RDW: 21.8 % — ABNORMAL HIGH (ref 11.5–15.5)
WBC: 16.5 10*3/uL — ABNORMAL HIGH (ref 4.0–10.5)
nRBC: 0.1 % (ref 0.0–0.2)

## 2019-09-14 LAB — BPAM RBC
Blood Product Expiration Date: 202109192359
ISSUE DATE / TIME: 202108240613
Unit Type and Rh: 9500

## 2019-09-14 LAB — GLUCOSE, CAPILLARY
Glucose-Capillary: 110 mg/dL — ABNORMAL HIGH (ref 70–99)
Glucose-Capillary: 101 mg/dL — ABNORMAL HIGH (ref 70–99)
Glucose-Capillary: 112 mg/dL — ABNORMAL HIGH (ref 70–99)
Glucose-Capillary: 129 mg/dL — ABNORMAL HIGH (ref 70–99)

## 2019-09-14 MED ORDER — FUROSEMIDE 10 MG/ML IJ SOLN
40.0000 mg | Freq: Once | INTRAMUSCULAR | Status: AC
  Administered 2019-09-14: 40 mg via INTRAVENOUS
  Filled 2019-09-14: qty 4

## 2019-09-14 MED ORDER — CLEVIDIPINE BUTYRATE 0.5 MG/ML IV EMUL
0.0000 mg/h | INTRAVENOUS | Status: DC
  Administered 2019-09-14: 1 mg/h via INTRAVENOUS
  Administered 2019-09-14 – 2019-09-15 (×2): 7 mg/h via INTRAVENOUS
  Administered 2019-09-15: 2 mg/h via INTRAVENOUS
  Administered 2019-09-15: 7 mg/h via INTRAVENOUS
  Filled 2019-09-14 (×2): qty 50
  Filled 2019-09-14 (×2): qty 100
  Filled 2019-09-14: qty 50
  Filled 2019-09-14: qty 100

## 2019-09-14 MED ORDER — MINOXIDIL 10 MG PO TABS
10.0000 mg | ORAL_TABLET | Freq: Every day | ORAL | Status: DC
  Administered 2019-09-14 – 2019-09-15 (×2): 10 mg via ORAL
  Filled 2019-09-14 (×2): qty 1

## 2019-09-14 MED ORDER — HYDRALAZINE HCL 50 MG PO TABS
100.0000 mg | ORAL_TABLET | Freq: Three times a day (TID) | ORAL | Status: DC
  Administered 2019-09-14 – 2019-09-15 (×5): 100 mg via ORAL
  Filled 2019-09-14 (×5): qty 2

## 2019-09-14 MED ORDER — POTASSIUM CHLORIDE CRYS ER 20 MEQ PO TBCR
40.0000 meq | EXTENDED_RELEASE_TABLET | Freq: Once | ORAL | Status: AC
  Administered 2019-09-14: 40 meq via ORAL
  Filled 2019-09-14: qty 2

## 2019-09-14 MED ORDER — HEPARIN SODIUM (PORCINE) 5000 UNIT/ML IJ SOLN
5000.0000 [IU] | Freq: Three times a day (TID) | INTRAMUSCULAR | Status: DC
  Administered 2019-09-14 – 2019-09-19 (×15): 5000 [IU] via SUBCUTANEOUS
  Filled 2019-09-14 (×16): qty 1

## 2019-09-14 MED ORDER — FLEET ENEMA 7-19 GM/118ML RE ENEM
1.0000 | ENEMA | Freq: Every day | RECTAL | Status: DC | PRN
  Filled 2019-09-14: qty 1

## 2019-09-14 NOTE — Progress Notes (Addendum)
  Progress Note    09/14/2019 7:25 AM 5 Days Post-Op  Subjective:  Spinal drain removed overnight.  Headache resolved.  Moving all extremities well.  Denies chest and back pain.     Vitals:   09/14/19 0505 09/14/19 0511  BP: (!) 196/77   Pulse: 70   Resp: 20 18  Temp:    SpO2: 93% 93%   Physical Exam: Lungs:  Non labored Incisions:  L iliac incision c/d/i Extremities:  Palpable DP pulses bilaterally Abdomen:  soft Neurologic: A&O  CBC    Component Value Date/Time   WBC 16.5 (H) 09/14/2019 0500   RBC 3.58 (L) 09/14/2019 0500   HGB 9.6 (L) 09/14/2019 0500   HCT 29.5 (L) 09/14/2019 0500   PLT 445 (H) 09/14/2019 0500   MCV 82.4 09/14/2019 0500   MCH 26.8 09/14/2019 0500   MCHC 32.5 09/14/2019 0500   RDW 21.8 (H) 09/14/2019 0500   LYMPHSABS 1.3 05/06/2017 0353   MONOABS 0.3 05/06/2017 0353   EOSABS 0.0 05/06/2017 0353   BASOSABS 0.0 05/06/2017 0353    BMET    Component Value Date/Time   NA 136 09/14/2019 0500   K 3.7 09/14/2019 0500   CL 102 09/14/2019 0500   CO2 25 09/14/2019 0500   GLUCOSE 121 (H) 09/14/2019 0500   BUN 16 09/14/2019 0500   CREATININE 0.99 09/14/2019 0500   CALCIUM 9.2 09/14/2019 0500   GFRNONAA 58 (L) 09/14/2019 0500   GFRAA >60 09/14/2019 0500    INR    Component Value Date/Time   INR 1.2 09/09/2019 1450     Intake/Output Summary (Last 24 hours) at 09/14/2019 0725 Last data filed at 09/14/2019 0511 Gross per 24 hour  Intake 783.65 ml  Output 2150 ml  Net -1366.35 ml     Assessment/Plan:  69 y.o. female is s/p TEVAR with iliac conduit 5 Days Post-Op   BLE well perfused with palpable DP pulses Spinal drain removed last night; patient moving all extremities well BP support as needed   Dagoberto Ligas, PA-C [facility] [phone] 09/14/2019 7:25 AM  I agree with the above.  Have seen and evaluated the patient.  Her headaches have resolved.  Her lumbar drain was removed last night due to concerns over  infection since it became disconnected and was draining into the bed.  She has not had any further issues with leg numbness or weakness.  She is complaining of lower abdominal pain consistent with constipation.  She is getting a bowel regimen today.  She can now get out of bed since her lumbar drain is out.  This should help with GI motility.  I am starting her on subcutaneous heparin today for DVT prophylaxis.  Case discussed with critical care  [name]

## 2019-09-14 NOTE — Progress Notes (Addendum)
NAME:  [name], MRN:  [id number], DOB:  1950/08/06, LOS: 22 ADMISSION DATE:  08/28/2019, CONSULTATION DATE:  08/28/2019 REFERRING MD:  ED Provider, CHIEF COMPLAINT:  Abdominal pain 2/2 descending aortic aneurysm    Brief History   69 year old female with PMHx of hypertension, tobacco use disorder, atrial fibrillation, hypothyroidism presented to [facility] with abdominal pain for 2 days. She was found to have a descending thoracic aortic aneurysm and was transferred to [facility] for further management.   Past Medical History   Past Medical History:  Diagnosis Date  . Anemia   . Anxiety   . Arthritis   . Atrial fib/flutter, transient   . COPD (chronic obstructive pulmonary disease) ([place])   . Depression   . Diabetes mellitus   . Diverticulosis   . DJD (degenerative joint disease)   . GERD (gastroesophageal reflux disease)   . Gout   . History of pneumonia   . Hypertension   . Hypothyroidism   . Internal hemorrhoids    Significant Hospital Events   8/8 transferred from [facility] 8/20 PCCM consulted for difficult to control HTN. to OR for endovascular repair.  8/22 unable to move legs. back to OR for LE paralysis lumbar drain placed 8/23 hypotensive. Antihypertensives stopped placed on norepinephrine drip with goal to keep blood pressure 140-160, had complete resolution of paralysis and equal strength bilateral lower extremities 8/24: Received 1 unit of blood in the morning.  Lumbar drain clamped and later removed inadvertently by the patient, however was stable for removal,  unfortunately did have spinal headache which is since with resolved.  Norepinephrine was weaned off, required as needed antihypertensives.required lasix for what seemed like volume overload and edema after blood.  8/25 adding back hydralazine. Getting OOB. Getting lasix again       Consults:  Vascular surgery  Procedures:  8/20 :   #1: Endovascular repair of thoracic aortic ulcer/aneurysm without coverage of left  subclavian                         #2: Left external iliac conduit (10 mm dacryon)                         #3: Left external iliac artery endarterectomy                         #4: Thoracic and abdominal aortogram                         #5: Radiology supervision interpretation                         #6: Ultrasound-guided access, left common femoral artery                         #7: Left external iliac stent (10x60)                         #8: Closure device, Mynx      8/22 Lumbar drain placed for LE paralysis  8/23 right radial aline 8/23 right IJ CVL>>> Significant Diagnostic Tests:   CT angio chest/abd/pel 8/08 >> acute appearing intramural hematoma of distal aortic arch and descending thoracic aorta originating at or distal to Lt [place] artery measuring 3.7 x 3.5 cm and  extends to level of Rt renal artery mild centrilobular emphysema, scarring at lung bases  Echo 8/9 >> LVEF 65-70% without RWMA; grade I diastolic dyusfunction; trivial mitral regurgitation   CT angio chest/abd/pel 8/11 >> stable appearance of acute intramural hematoma arising distal to the takeoff of the left subclavian artery and terminating at the level of the proximal abdominal aorta; previously demonstrated possible penetrating ulcer arising from the descending thoracic aorta no longer well appreciated and likely resolved in the interim; bilateral pleural effusions with mild interlobular septal thickening suggestive of volume overload  LUE Vascular US 8/13 >> No DVT. Acute superficial vein thrombosis of L basilic vein and L cephalic vein; acute occlusive superficial thrombophlebitis in mid basilic vein and cephalic vein   CTA chest 8/14 >> stable known intramural thoracic aortic hematoma with stable aneurysm dilation of the ascending thoracic aorta measuring 4 cm in AP diameter.  Slight worsening of small simple left pleural effusion with associated by basilar atelectasis.  Slightly nodular component to some of this  posterior dependent density over the mid lung as cannot exclude superimposed infection   Micro Data:  COVID 8/8 > negative MRSA 8/8 > negative  Antimicrobials:  None   Interim history/subjective:  No head ache; pain controlled.    Objective    Today's Vitals   09/14/19 0700 09/14/19 0753 09/14/19 0800 09/14/19 0900  BP: (Abnormal) 161/58  (Abnormal) 179/67 (Abnormal) 176/87  Pulse: 76  83 70  Resp: 17  15 17   Temp:  99.5 F (37.5 C)    TempSrc:  Oral    SpO2: 91%  90% 91%  Weight:      Height:      PainSc:   0-No pain   CVP:  [5 mmHg-16 mmHg] 10 mmHg Body mass index is 29.92 kg/m.  General this a very pleasant 69 year old black female she is resting in bed, currently in no acute distress. 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  Eventually will need to decide about her chlorthalidone 25 mg a day, spironolactone 50 mg a day, clonidine 0.3 mg a day, nifedipine 90 mg a day, and minoxidil 10 mg a day She can move out of the  intensive care later this afternoon if she is not requiring frequent as needed medications  Spinal cord ischemia post TEVAR- rapidly improved with lumbar drain Plan Avoiding hypotension  Acute hypoxic respiratory failure requiring nasal cannula PCXR personally reviewed: new right > left airspace disease. Looks like a mix of asymmetric edema and atelectasis  -Oxygen requirements improved following diuresis on 8/24, however still on 5 L via nasal cannula Plan Repeating Lasix Wean oxygen Incentive spirometry Continuous  pulse oximetry Mobilization   Leukocytosis--> etiology unclear, spiked fever max of 100.8 on 8/24 but none since Plan Continue to trend CBC and fever curve Remove Foley catheter  Anemia w/out evidence of bleeding-->hgb did drop to 6.7 8/24, received 1 unit of blood; hemoglobin stable since Plan A.m. CBC Starting [place] heparin   Hypothyroidism- TSH mildly low but in setting of critical illness; thyroid hormone not ideal with severe refractory hypertension Plan cont synthroid at 25 mcg/d-->TSH week of 30th   Chronic back pain- complicating hypertension Plan Continue PCA, gabapentin, and Zoloft  She has as needed oral analgesia ordered, may be able to discontinue PCA later today    Hyperglycemia 2/2 poorly controlled type II diabetes mellitus: cbg w/in goal Plan Sliding scale insulin with goal 140-180  Constipation: Plan Continue MiraLAX and Colace     Best practice:  Diet: carb modified, heart healthy Pain/Anxiety/Delirium protocol (if indicated): PCA VAP protocol (if indicated): none DVT prophylaxis: SCDs GI prophylaxis: pepcid  Glucose control: SSI Mobility: As tolerated Code Status: FULL  Family Communication: updated patient Disposition: ICU for neuro checks and maintaining SBP; may be able to move to progressive care later today 32-minute critical care time  Erick Colace ACNP-BC [place] Pager # [phone] OR #  [phone] if no answer

## 2019-09-15 ENCOUNTER — Inpatient Hospital Stay (HOSPITAL_COMMUNITY): Payer: Medicare Other

## 2019-09-15 LAB — BASIC METABOLIC PANEL
Anion gap: 10 (ref 5–15)
BUN: 13 mg/dL (ref 8–23)
CO2: 28 mmol/L (ref 22–32)
Calcium: 9 mg/dL (ref 8.9–10.3)
Chloride: 100 mmol/L (ref 98–111)
Creatinine, Ser: 1.08 mg/dL — ABNORMAL HIGH (ref 0.44–1.00)
GFR calc Af Amer: >60 mL/min (ref >60)
GFR calc non Af Amer: 52 mL/min — ABNORMAL LOW (ref >60)
Glucose, Bld: 109 mg/dL — ABNORMAL HIGH (ref 70–99)
Potassium: 3.8 mmol/L (ref 3.5–5.1)
Sodium: 138 mmol/L (ref 135–145)

## 2019-09-15 LAB — CBC
HCT: 33.2 % — ABNORMAL LOW (ref 36.0–46.0)
Hemoglobin: 10.4 g/dL — ABNORMAL LOW (ref 12.0–15.0)
MCH: 26.2 pg (ref 26.0–34.0)
MCHC: 31.3 g/dL (ref 30.0–36.0)
MCV: 83.6 fL (ref 80.0–100.0)
Platelets: 501 10*3/uL — ABNORMAL HIGH (ref 150–400)
RBC: 3.97 MIL/uL (ref 3.87–5.11)
RDW: 22.1 % — ABNORMAL HIGH (ref 11.5–15.5)
WBC: 16.6 10*3/uL — ABNORMAL HIGH (ref 4.0–10.5)
nRBC: 0.1 % (ref 0.0–0.2)

## 2019-09-15 LAB — GLUCOSE, CAPILLARY
Glucose-Capillary: 105 mg/dL — ABNORMAL HIGH (ref 70–99)
Glucose-Capillary: 155 mg/dL — ABNORMAL HIGH (ref 70–99)
Glucose-Capillary: 115 mg/dL — ABNORMAL HIGH (ref 70–99)
Glucose-Capillary: 134 mg/dL — ABNORMAL HIGH (ref 70–99)

## 2019-09-15 LAB — BRAIN NATRIURETIC PEPTIDE: B Natriuretic Peptide: 527.8 pg/mL — ABNORMAL HIGH (ref 0.0–100.0)

## 2019-09-15 LAB — MAGNESIUM: Magnesium: 1.7 mg/dL (ref 1.7–2.4)

## 2019-09-15 MED ORDER — MAGNESIUM SULFATE 2 GM/50ML IV SOLN
2.0000 g | Freq: Once | INTRAVENOUS | Status: AC
  Administered 2019-09-15: 2 g via INTRAVENOUS
  Filled 2019-09-15: qty 50

## 2019-09-15 MED ORDER — CARVEDILOL 25 MG PO TABS
25.0000 mg | ORAL_TABLET | Freq: Two times a day (BID) | ORAL | Status: DC
  Administered 2019-09-15: 25 mg via ORAL
  Filled 2019-09-15: qty 1

## 2019-09-15 MED ORDER — CLONIDINE HCL 0.1 MG PO TABS: 0.1000 mg | ORAL_TABLET | Freq: Three times a day (TID) | ORAL | Status: DC

## 2019-09-15 MED ORDER — CHLORTHALIDONE 25 MG PO TABS
25.0000 mg | ORAL_TABLET | Freq: Every day | ORAL | Status: DC
  Administered 2019-09-15: 25 mg via ORAL
  Filled 2019-09-15 (×2): qty 1

## 2019-09-15 MED ORDER — SPIRONOLACTONE 25 MG PO TABS: 50.0000 mg | ORAL_TABLET | Freq: Every day | ORAL | Status: DC

## 2019-09-15 MED ORDER — CLONIDINE HCL 0.2 MG PO TABS
0.2000 mg | ORAL_TABLET | Freq: Three times a day (TID) | ORAL | Status: DC
  Administered 2019-09-15 (×2): 0.2 mg via ORAL
  Filled 2019-09-15 (×2): qty 1

## 2019-09-15 MED ORDER — LABETALOL HCL 300 MG PO TABS: 600.0000 mg | ORAL_TABLET | Freq: Three times a day (TID) | ORAL | Status: DC

## 2019-09-15 MED ORDER — NIFEDIPINE ER OSMOTIC RELEASE 90 MG PO TB24
90.0000 mg | ORAL_TABLET | Freq: Every day | ORAL | Status: DC
  Filled 2019-09-15: qty 1

## 2019-09-15 MED ORDER — GABAPENTIN 100 MG PO CAPS
100.0000 mg | ORAL_CAPSULE | Freq: Three times a day (TID) | ORAL | Status: DC
  Administered 2019-09-15 – 2019-09-19 (×13): 100 mg via ORAL
  Filled 2019-09-15 (×13): qty 1

## 2019-09-15 NOTE — Evaluation (Signed)
Physical Therapy Evaluation Patient Details Name: [name] MRN: [id number] DOB: 02-Jan-1951 Today's Date: 09/15/2019   History of Present Illness  69 yo admitted to [facility] on 8/8 with abdominal and back pain found to have thoracic aortic dissection with intramural hematoma and HTN transferred to [facility] s/p TEVAR with spinal cord ischemia with LE paralysis with was treated with EVD with improvement. PMHx: HTN, DM, gout, asthma, Afib  Clinical Impression  Pt very pleasant and eager to get OOB. 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 Pt reports she has been moving legs in bed and was thrilled to walk this session. Pt able to walk 200' with RW on 6L with inconsistent pleth throughout. Pt with 5/5 strength and WFL sensation bil LE with pt reporting complete resolution of symptoms prior to EVD. Pt with decreased activity and function who will benefit from acute therapy to maximize mobility for return home.   Supine 129/55 (75) Sitting 113/64 (78) After gait 123/56 (73) HR 84     Follow Up Recommendations Home health PT    Equipment Recommendations  Rolling walker with 5" wheels    Recommendations for Other Services       Precautions / Restrictions Precautions Precautions: Fall Precaution Comments: watch sats Restrictions Weight Bearing Restrictions: Yes      Mobility  Bed Mobility Overal bed mobility: Needs Assistance Bed Mobility: Supine to Sit     Supine to sit: Supervision     General bed mobility comments: cues for sequence and safety with supervision for lines  Transfers Overall transfer level: Needs assistance   Transfers: Sit to/from Stand Sit to Stand: Supervision         General transfer comment: supervision for lines and safety  Ambulation/Gait Ambulation/Gait assistance: Min guard Gait Distance (Feet): 200 Feet Assistive device: Rolling walker (2 wheeled) Gait Pattern/deviations: Step-through pattern;Decreased stride  length   Gait velocity interpretation: >2.62 ft/sec, indicative of community ambulatory General Gait Details: pt with good stability with RW with cues for proximity and direction.  Stairs            Wheelchair Mobility    Modified Rankin (Stroke Patients Only)       Balance Overall balance assessment: Mild deficits observed, not formally tested                                           Pertinent Vitals/Pain Pain Assessment: No/denies pain    Home Living Family/patient expects to be discharged to:: Private residence Living Arrangements: Spouse/significant other Available Help at Discharge: Family;Available PRN/intermittently Type of Home: Mobile home Home Access: Ramped entrance     Home Layout: One level Home Equipment: Shower seat      Prior Function Level of Independence: Independent         Comments: 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 son currently visiting from [place]        Extremity/Trunk Assessment   Upper Extremity Assessment Upper Extremity Assessment: Overall WFL for tasks assessed    Lower Extremity Assessment Lower Extremity Assessment: Overall WFL for tasks assessed    Cervical / Trunk Assessment Cervical / Trunk Assessment: Normal  Communication   Communication: No difficulties  Cognition Arousal/Alertness: Awake/alert Behavior During Therapy: WFL for tasks assessed/performed Overall Cognitive Status: Within Functional Limits for tasks assessed  General Comments      Exercises     Assessment/Plan    PT Assessment Patient needs continued PT services  PT Problem List Decreased mobility;Decreased activity tolerance;Decreased knowledge of use of DME;Cardiopulmonary status limiting activity       PT Treatment Interventions Gait training;Functional mobility training;Therapeutic activities;Patient/family  education;DME instruction;Therapeutic exercise    PT Goals (Current goals can be found in the Care Plan section)  Acute Rehab PT Goals Patient Stated Goal: return home to take care of spouse PT Goal Formulation: With patient Time For Goal Achievement: 09/29/19 Potential to Achieve Goals: Fair    Frequency Min 3X/week   Barriers to discharge Decreased caregiver support      Co-evaluation               AM-PAC PT "6 Clicks" Mobility  Outcome Measure Help needed turning from your back to your side while in a flat bed without using bedrails?: None Help needed moving from lying on your back to sitting on the side of a flat bed without using bedrails?: A Little Help needed moving to and from a bed to a chair (including a wheelchair)?: A Little Help needed standing up from a chair using your arms (e.g., wheelchair or bedside chair)?: A Little Help needed to walk in hospital room?: A Little Help needed climbing 3-5 steps with a railing? : A Little 6 Click Score: 19    End of Session Equipment Utilized During Treatment: Gait belt;Oxygen Activity Tolerance: Patient tolerated treatment well Patient left: in chair;with call bell/phone within reach;with chair alarm set Nurse Communication: Mobility status PT Visit Diagnosis: Other abnormalities of gait and mobility (R26.89)    Time: 9323-5573 PT Time Calculation (min) (ACUTE ONLY): 33 min   Charges:   PT Evaluation $PT Eval Moderate Complexity: 1 Mod PT Treatments $Gait Training: 8-22 mins        Xiao Graul P, PT Acute Rehabilitation Services Pager: [phone] Office: [place] 09/15/2019, 10:58 AM

## 2019-09-15 NOTE — Evaluation (Signed)
Occupational Therapy Evaluation Patient Details Name: [name] MRN: [id number] DOB: [birth year] Today's Date: 09/15/2019    History of Present Illness 69 yo admitted to [facility] on 8/8 with abdominal and back pain found to have thoracic aortic dissection with intramural hematoma and HTN transferred to [facility] s/p TEVAR with spinal cord ischemia with LE paralysis with was treated with EVD with improvement. PMHx: HTN, DM, gout, asthma, Afib   Clinical Impression   This 69 y/o female presents with the above. 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 Pt very pleasant and willing to work with therapy. She currently requires minA for functional transfers, and grossly minA for LB/toileting ADL. Pt to benefit from continued acute OT services and currently recommend follow up [facility] services after discharge to maximize her safety and independence with ADL and mobility.   BP start of session: 112/55 (68) End of session: 118/51 (68) SpO2 >90% on supplemental O2 (7L), 100% end of session HR 80s    Follow Up Recommendations  Home health OT    Equipment Recommendations  3 in 1 bedside commode           Precautions / Restrictions Precautions Precautions: Fall Precaution Comments: watch sats Restrictions Weight Bearing Restrictions: No      Mobility Bed Mobility Overal bed mobility: Needs Assistance Bed Mobility: Supine to Sit     Supine to sit: Supervision     General bed mobility comments: OOB in recliner   Transfers Overall transfer level: Needs assistance   Transfers: Sit to/from Stand;Stand Pivot Transfers Sit to Stand: Min guard Stand pivot transfers: Min assist       General transfer comment: steadying assist with transitions, assist for lines and balance    Balance Overall balance assessment: Needs assistance Sitting-balance support: Feet supported Sitting balance-Leahy  Scale: Good     Standing balance support: Single extremity supported Standing balance-Leahy Scale: Poor                             ADL either performed or assessed with clinical judgement   ADL Overall ADL's : Needs assistance/impaired Eating/Feeding: Modified independent   Grooming: Set up;Sitting   Upper Body Bathing: Set up;Sitting   Lower Body Bathing: Minimal assistance;Sit to/from stand   Upper Body Dressing : Set up;Sitting   Lower Body Dressing: Minimal assistance;Sit to/from stand Lower Body Dressing Details (indicate cue type and reason): pt using figure 4 to adjust socks while seated in recliner  Toilet Transfer: Minimal assistance;BSC;Stand-pivot   Toileting- Clothing Manipulation and Hygiene: Minimal assistance;Sitting/lateral lean;Sit to/from stand       Functional mobility during ADLs: Minimal assistance (stand pivot )       Vision         Perception     Praxis      Pertinent Vitals/Pain Pain Assessment: No/denies pain     Hand Dominance     Extremity/Trunk Assessment Upper Extremity Assessment Upper Extremity Assessment: Overall WFL for tasks assessed   Lower Extremity Assessment Lower Extremity Assessment: Defer to PT evaluation   Cervical / Trunk Assessment Cervical / Trunk Assessment: Normal   Communication Communication Communication: No difficulties   Cognition Arousal/Alertness: Awake/alert Behavior During Therapy: WFL for tasks assessed/performed Overall Cognitive Status: Within Functional Limits for tasks assessed  General Comments       Exercises     Shoulder Instructions      Home Living Family/patient expects to be discharged to:: Private residence Living Arrangements: Spouse/significant other Available Help at Discharge: Family;Available PRN/intermittently Type of Home: Mobile home Home Access: Ramped entrance     Home Layout: One level      Bathroom Shower/Tub: Teacher, early years/pre: Standard     Home Equipment: Shower seat          Prior Functioning/Environment Level of Independence: Independent        Comments: 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 son currently visiting from [place]        OT Problem List: Decreased strength;Decreased range of motion;Decreased activity tolerance;Impaired balance (sitting and/or standing);Decreased knowledge of use of DME or AE;Decreased knowledge of precautions      OT Treatment/Interventions: Self-care/ADL training;Therapeutic exercise;Energy conservation;DME and/or AE instruction;Therapeutic activities;Patient/family education;Balance training    OT Goals(Current goals can be found in the care plan section) Acute Rehab OT Goals Patient Stated Goal: return home to take care of spouse OT Goal Formulation: With patient Time For Goal Achievement: 09/29/19 Potential to Achieve Goals: Good  OT Frequency: Min 2X/week   Barriers to D/C:            Co-evaluation              AM-PAC OT "6 Clicks" Daily Activity     Outcome Measure Help from another person eating meals?: None Help from another person taking care of personal grooming?: A Little Help from another person toileting, which includes using toliet, bedpan, or urinal?: A Little Help from another person bathing (including washing, rinsing, drying)?: A Little Help from another person to put on and taking off regular upper body clothing?: A Little Help from another person to put on and taking off regular lower body clothing?: A Little 6 Click Score: 19   End of Session Equipment Utilized During Treatment: Oxygen Nurse Communication: Mobility status  Activity Tolerance: Patient tolerated treatment well Patient left: in chair;with call bell/phone within reach;with chair alarm set  OT Visit Diagnosis: Unsteadiness on feet (R26.81);Other abnormalities of gait and mobility (R26.89)                 Time: 7989-2119 OT Time Calculation (min): 23 min Charges:  OT General Charges $OT Visit: 1 Visit OT Evaluation $OT Eval Moderate Complexity: 1 Mod OT Treatments $Self Care/Home Management : 8-22 mins  Lou Cal, OT Acute Rehabilitation Services Pager [phone] Office [place] 09/15/2019, 12:35 PM

## 2019-09-15 NOTE — Consult Note (Addendum)
Cardiology Consultation:   Patient ID: [name]; [id number]; 11/01/1950   Admit date: 08/28/2019 Date of Consult: 09/15/2019  Primary Care Provider: Antonietta Jewel, MD Primary Cardiologist: New  Primary Electrophysiologist:  Dr. Lovena Le (remote 2016)  Patient Profile:   [name] is a 69 y.o. 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   History of Present Illness:   [name] is a 69 year old female with a history stated above who presented to [facility] emergency department 08/28/2019 with acute onset of chest and abdominal pain which began approximately 2 hours prior to her arrival. On ED arrival, CT scan revealed thoracic aortic intramural hematoma/aneurysm with a maximum aortic diameter at 3.7 with ulceration therefore she was transferred to [facility]. [facility]VS was consulted with plans to initially treat medically with aggressive BP control with consideration for endovascular repair at a later time. Repeat CTA performed 08/30/2019 was found to be stable when compared to prior imaging with plans for surgical intervention with endovascular repair, ultimately performed 1/60/1093 with complication including spinal cord ischemia which rapidly improved with lumbar drain placement which has since been discontinued without LE deficit.  Cardiology has been asked to follow given resistant hypertension on multiple medications including hydralazine 100mg  TID, Minoxidil 10 QD, labetalol 600mg  TID, spironolactone 50mg  QD, chlorothiazide 25mg  QD. In the post operative setting patient was doing well and home medications were restarted however resulted in the need for vasopressor support  with norepinephrine and vasopressin. She has since been weaned off these medications given resultant hypertension. PCCM wishes to move slowly with re-initiation of antihypertensives at this point as to avoid over compensation. Initial plan was to restart nifedipine 90 mg QD today however will now be added at a later time if necessary. Patient has a history of angioedema with lisinopril and amlodipine which makes medication titration difficult to manage. BPs have now stabilized with the addition of Cleviprex at 7mg /hr at 123/56>131/66>120/58. Previous BPs noted to be 186/78>178/157>190/157 on 09/14/19. She denies chest pain, SOB, back pain, dizziness, headaches or syncope.   In chart review, it appears BPs were elevated on ED arrival at 130/170 however stabilized during her course until recently. 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  She has remotely been followed by Dr. Lovena Le symptomatic bradycardia now status post dual-chamber Medtronic PPM placement for which she was last seen 03/2014 with normal functioning pacemaker and adequate parameters.  Was noted to have episodic atrial fibrillation with recommendations for watchful waiting.  Dr. Lovena Le recommended systemic anticoagulation if longer durations, greater than 1 hour.  She has not been seen since that time.  Past Medical History:  Diagnosis Date  . Anemia   . Anxiety   . Arthritis   . Atrial fib/flutter, transient   . COPD (chronic obstructive pulmonary disease) ([place])   . Depression   . Diabetes mellitus   . Diverticulosis   . DJD (degenerative joint disease)   . GERD (gastroesophageal reflux  disease)   . Gout   .  History of pneumonia   . Hypertension   . Hypothyroidism   . Internal hemorrhoids     Past Surgical History:  Procedure Laterality Date  . CATARACT EXTRACTION W/ INTRAOCULAR LENS IMPLANT Left   . ENDARTERECTOMY Left 09/09/2019   Procedure: LEFT ILIAC ENDARTERECTOMY;  Surgeon: Serafina Mitchell, MD;  Location: [place];  Service: Vascular;  Laterality: Left;  . HERNIA REPAIR    . INSERTION OF ILIAC STENT Left 09/09/2019   Procedure: INSERTION OF LEFT ILIAC STENT;  Surgeon: Serafina Mitchell, MD;  Location: [place];  Service: Vascular;  Laterality: Left;  . PACEMAKER INSERTION    . THORACIC AORTIC ENDOVASCULAR STENT GRAFT N/A 09/09/2019   Procedure: THORACIC AORTIC ENDOVASCULAR STENT GRAFT WITH LEFT ILIAC CONDUIT;  Surgeon: Serafina Mitchell, MD;  Location: [facility];  Service: Vascular;  Laterality: N/A;  . TUBAL LIGATION    . ULTRASOUND GUIDANCE FOR VASCULAR ACCESS Left 09/09/2019   Procedure: ULTRASOUND GUIDANCE FOR VASCULAR ACCESS;  Surgeon: Serafina Mitchell, MD;  Location: [place];  Service: Vascular;  Laterality: Left;  Marland Kitchen VAGINAL HYSTERECTOMY       Prior to Admission medications   Medication Sig Start Date End Date Taking? Authorizing Provider  aspirin 81 MG tablet Take 81 mg by mouth every morning.    Yes [provider]  carvedilol (COREG) 25 MG tablet Take 25 mg by mouth 2 (two) times daily with a meal.     Yes [provider]  cholecalciferol (VITAMIN D) 1000 UNITS tablet Take 1,000 Units by mouth every morning.    Yes [provider]  cloNIDine (CATAPRES) 0.2 MG tablet Take 0.2 mg by mouth 3 (three) times daily.     Yes [provider]  hydrALAZINE (APRESOLINE) 100 MG tablet Take 100 mg by mouth 3 (three) times daily. 07/28/19  Yes [provider]  hydrochlorothiazide (HYDRODIURIL) 12.5 MG tablet Take 1 tablet (12.5 mg total) by mouth daily. 05/25/16  Yes Noemi Chapel, MD  levothyroxine (SYNTHROID) 200 MCG tablet Take 200 mcg by mouth daily. 08/15/19   Yes [provider]  lovastatin (MEVACOR) 20 MG tablet Take 20 mg by mouth at bedtime.   Yes [provider]  metFORMIN (GLUCOPHAGE) 500 MG tablet Take 500 mg by mouth 2 (two) times daily.   Yes [provider]  Multiple Vitamin (MULTIVITAMIN) tablet Take 1 tablet by mouth every morning.    Yes [provider]  Oxycodone HCl 10 MG TABS Take 10 mg by mouth 4 (four) times daily as needed. 08/08/19  Yes [provider]  polyethylene glycol (MIRALAX / GLYCOLAX) packet Take 17 g by mouth 2 (two) times daily.    Yes [provider]  potassium chloride SA (KLOR-CON) 20 MEQ tablet Take 20 mEq by mouth daily. 08/15/19  Yes [provider]  sertraline (ZOLOFT) 100 MG tablet Take 50 mg by mouth at bedtime.    Yes [provider]    Inpatient Medications: Scheduled Meds: . Chlorhexidine Gluconate Cloth  6 each Topical Daily  . chlorthalidone  25 mg Oral Daily  . cholecalciferol  1,000 Units Oral q morning - 10a  . docusate sodium  100 mg Oral Daily  . famotidine  20 mg Oral Daily  . gabapentin  100 mg Oral TID  . heparin injection (subcutaneous)  5,000 Units Subcutaneous Q8H  . hydrALAZINE  100 mg Oral Q8H  . insulin aspart  0-15 Units Subcutaneous TID  WC  . insulin aspart  0-5 Units Subcutaneous QHS  . labetalol  600 mg Oral TID  . levothyroxine  25 mcg Oral Q0600  . melatonin  9 mg Oral QHS  . minoxidil  10 mg Oral Daily  . nicotine  7 mg Transdermal Daily  . NIFEdipine  90 mg Oral Daily  . pantoprazole  40 mg Oral Daily  . polyethylene glycol  17 g Oral BID  . pravastatin  20 mg Oral q1800  . sertraline  50 mg Oral QHS  . spironolactone  50 mg Oral Daily   Continuous Infusions: . sodium chloride Stopped (08/30/19 0233)  . clevidipine 7 mg/hr (09/15/19 0600)  . lactated ringers    . magnesium sulfate bolus IVPB     PRN Meds: sodium chloride, acetaminophen **OR** acetaminophen, alum & mag hydroxide-simeth,  diphenhydrAMINE, docusate sodium, guaiFENesin-dextromethorphan, HYDROmorphone (DILAUDID) injection, lidocaine, magnesium sulfate bolus IVPB, metoprolol tartrate, ondansetron, ondansetron, oxyCODONE, phenol, pneumococcal 23 valent vaccine, sodium phosphate  Allergies:    Allergies  Allergen Reactions  . Amlodipine Swelling    Started very close to episode of angioedema to ACE, unknown if this added effects or just residual  . Lisinopril     angioedema  . Losartan Swelling    Lip swelling   . Codeine Phosphate Other (See Comments)    REACTION: UNKNOWN  . Morphine And Related Other (See Comments)    Cannot take:Heart problems  . Naproxen Nausea And Vomiting  . Nsaids Nausea Only  . Penicillins     UNKNOWN REACTION  . Propoxyphene N-Acetaminophen Other (See Comments)    Makes me gag  . Sulfamethoxazole-Trimethoprim Other (See Comments)    Gi upset  . Flexeril [Cyclobenzaprine] Anxiety and Other (See Comments)    Pt states medication gives her nightmares and insomnia.    Social History:   Social History   Socioeconomic History  . Marital status: Married    Spouse name: Not on file  . Number of children: 2  . Years of education: Not on file  . Highest education level: Not on file  Occupational History  . Occupation: Disabled    Employer: UNEMPLOYED  Tobacco Use  . Smoking status: Current Some Day Smoker    Packs/day: 0.50    Years: 30.00    Pack years: 15.00    Types: Cigarettes  . Smokeless tobacco: Never Used  Substance and Sexual Activity  . Alcohol use: No  . Drug use: No  . Sexual activity: Yes    Birth control/protection: Surgical  Other Topics Concern  . Not on file  Social History Narrative  . Not on file   Social Determinants of Health   Financial Resource Strain:   . Difficulty of Paying Living Expenses: Not on file  Food Insecurity:   . Worried About Charity fundraiser in the Last Year: Not on file  . Ran Out of Food in the Last Year: Not on file   Transportation Needs:   . Lack of Transportation (Medical): Not on file  . Lack of Transportation (Non-Medical): Not on file  Physical Activity:   . Days of Exercise per Week: Not on file  . Minutes of Exercise per Session: Not on file  Stress:   . Feeling of Stress : Not on file  Social Connections:   . Frequency of Communication with Friends and Family: Not on file  . Frequency of Social Gatherings with Friends and Family: Not on file  . Attends Religious Services: Not on  file  . Active Member of Clubs or Organizations: Not on file  . Attends Archivist Meetings: Not on file  . Marital Status: Not on file  Intimate Partner Violence:   . Fear of Current or Ex-Partner: Not on file  . Emotionally Abused: Not on file  . Physically Abused: Not on file  . Sexually Abused: Not on file    Family History:   Family History  Problem Relation Age of Onset  . Prostate cancer Father   . Hypertension Mother   . Heart attack Mother   . Heart disease Mother   . Stomach cancer Son   . Diabetes Maternal Aunt   . Heart disease Maternal Uncle   . Colon cancer Neg Hx   . Esophageal cancer Neg Hx   . Rectal cancer Neg Hx    Family Status:  Family Status  Relation Name Status  . Father  (Not Specified)  . Mother  (Not Specified)  . Son  (Not Specified)  . Mat Aunt  (Not Specified)  . Mat Uncle  (Not Specified)  . Neg Hx  (Not Specified)    ROS:  Please see the history of present illness.  All other ROS reviewed and negative.     Physical Exam/Data:   Vitals:   09/15/19 0530 09/15/19 0554 09/15/19 0600 09/15/19 0700  BP: 131/68 131/68 (!) 120/58 131/66  Pulse: 86  83 88  Resp: 15  16 20   Temp:      TempSrc:    Oral  SpO2: 91%  (!) 88% (!) 89%  Weight:      Height:        Intake/Output Summary (Last 24 hours) at 09/15/2019 1031 Last data filed at 09/15/2019 0700 Gross per 24 hour  Intake 494.97 ml  Output 4800 ml  Net -4305.03 ml   Filed Weights   09/13/19  0600 09/14/19 0500 09/15/19 0500  Weight: 69.7 kg 69.5 kg 66.3 kg   Body mass index is 28.55 kg/m.   General: Elderly, NAD Skin: Warm, dry, intact  Neck: Negative for carotid bruits. Mild JVD Lungs:Bilateral LL rales with no wheezes. Breathing is unlabored. Cardiovascular: RRR with S1 S2. No murmurs Abdomen: Soft, non-tender, non-distended. No obvious abdominal masses. Extremities: No edema. Radial pulses 2+ bilaterally Neuro: Alert and oriented. No focal deficits. No facial asymmetry. MAE spontaneously. Psych: Responds to questions appropriately with normal affect.    EKG:  The EKG was personally reviewed and demonstrates: 09/03/19 NSR with HR 70bpm and no acute ST changes  Telemetry:  Telemetry was personally reviewed and demonstrates: 09/15/19 NSR with rates in the 80-90's   Relevant CV Studies:  Abdominal/pelvic/chest CTA 08/30/2019:  IMPRESSION: 1. Overall stable appearance of acute intramural hematoma arising distal to the takeoff of the left subclavian artery and terminating at the level of the proximal abdominal aorta. 2. Previously demonstrated possible penetrating ulcer arising from the descending thoracic aorta is no longer well appreciated and has likely resolved in the interim. 3. Small bilateral pleural effusions with mild interlobular septal thickening suggestive of volume overload. 4. 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 5. Mild diffuse wall thickening of the esophagus may be secondary to underdistention or esophagitis.  Echocardiogram 08/29/2019: 1. Left ventricular ejection fraction, by estimation, is 65 to 70%. The  left ventricle has normal function. The left ventricle has no regional  wall motion abnormalities. There is mild concentric left ventricular  hypertrophy. Left ventricular diastolic  parameters are consistent with Grade I  diastolic dysfunction (impaired  relaxation).  2. Right ventricular systolic function is normal. The  right ventricular  size is normal. There is moderately elevated pulmonary artery systolic  pressure. The estimated right ventricular systolic pressure is 63.0 mmHg.  3. Left atrial size was mild to moderately dilated.  4. Right atrial size was mild to moderately dilated.  5. The mitral valve is degenerative. Trivial mitral valve regurgitation.  No evidence of mitral stenosis.  6. The aortic valve is tricuspid. Aortic valve regurgitation is not  visualized. Mild aortic valve stenosis. Aortic valve area, by VTI measures  1.97 cm. Aortic valve mean gradient measures 12.5 mmHg. Aortic valve Vmax  measures 2.35 m/s.  7. The inferior vena cava is dilated in size with >50% respiratory  variability, suggesting right atrial pressure of 8 mmHg.   Laboratory Data:  Chemistry Recent Labs  Lab 09/13/19 2208 09/14/19 0500 09/15/19 0256  NA 136 136 138  K 4.3 3.7 3.8  CL 100 102 100  CO2 25 25 28   GLUCOSE 132* 121* 109*  BUN 20 16 13   CREATININE 1.05* 0.99 1.08*  CALCIUM 9.3 9.2 9.0  GFRNONAA 54* 58* 52*  GFRAA >60 >60 >60  ANIONGAP 11 9 10     Total Protein  Date Value Ref Range Status  08/28/2019 7.3 6.5 - 8.1 g/dL Final   Albumin  Date Value Ref Range Status  08/28/2019 3.7 3.5 - 5.0 g/dL Final   AST  Date Value Ref Range Status  08/28/2019 21 15 - 41 U/L Final   ALT  Date Value Ref Range Status  08/28/2019 19 0 - 44 U/L Final   Alkaline Phosphatase  Date Value Ref Range Status  08/28/2019 78 38 - 126 U/L Final   Total Bilirubin  Date Value Ref Range Status  08/28/2019 0.5 0.3 - 1.2 mg/dL Final   Hematology Recent Labs  Lab 09/13/19 0346 09/13/19 0346 09/13/19 1548 09/14/19 0500 09/15/19 0256  WBC 13.3*  --   --  16.5* 16.6*  RBC 2.67*  --   --  3.58* 3.97  HGB 6.7*   < > 9.4* 9.6* 10.4*  HCT 21.3*   < > 30.4* 29.5* 33.2*  MCV 79.8*  --   --  82.4 83.6  MCH 25.1*  --   --  26.8 26.2  MCHC 31.5  --   --  32.5 31.3  RDW 20.5*  --   --  21.8* 22.1*  PLT  359  --   --  445* 501*   < > = values in this interval not displayed.   Cardiac EnzymesNo results for input(s): TROPONINI in the last 168 hours. No results for input(s): TROPIPOC in the last 168 hours.  BNPNo results for input(s): BNP, PROBNP in the last 168 hours.  DDimer No results for input(s): DDIMER in the last 168 hours. TSH:  Lab Results  Component Value Date   TSH 0.111 (L) 09/05/2019   Lipids: Lab Results  Component Value Date   CHOL 158 08/09/2008   HDL 55.00 08/09/2008   LDLCALC 89 08/09/2008   LDLDIRECT 128.9 05/21/2006   TRIG 44 09/06/2019   CHOLHDL 3 08/09/2008   HgbA1c: Lab Results  Component Value Date   HGBA1C 6.9 (H) 08/28/2019    Radiology/Studies:  DG CHEST PORT 1 VIEW  Result Date: 09/13/2019 CLINICAL DATA:  Hypoxia EXAM: PORTABLE CHEST 1 VIEW COMPARISON:  September 12, 2019 FINDINGS: There is patchy airspace opacity in the medial left base, stable. There are new  areas of ill-defined opacity throughout the right lung as well as a degree of somewhat generalized interstitial edema. Heart is enlarged with pacemaker leads stable in position. Aortic arch and descending thoracic aortic stent present, unchanged. No appreciable adenopathy. No bone lesions. Central catheter tip is in the superior vena cava. No pneumothorax. IMPRESSION: New areas of patchy airspace opacity in the right lung, likely in large part due to edema. Somewhat generalized interstitial edema is present. Consolidation in the medial left base raises concern for superimposed pneumonia. There is stable cardiomegaly. Stable aortic stent as well as pacemaker leads. Stable central catheter position. No pneumothorax. Electronically Signed   By: Lowella Grip III M.D.   On: 09/13/2019 08:15   DG Chest Port 1 View  Result Date: 09/12/2019 CLINICAL DATA:  Central line placement EXAM: PORTABLE CHEST 1 VIEW COMPARISON:  09/07/2019 FINDINGS: Interval placement of right IJ approach central venous catheter with  distal tip terminating at the level of the SVC. Interval placement of thoracic aortic stent graft. Right-sided implanted cardiac device. Stable cardiomegaly. Lungs are clear. No pleural effusion or pneumothorax. IMPRESSION: Interval placement of right IJ approach central venous catheter and aortic stent graft. No pneumothorax. Electronically Signed   By: Davina Poke D.O.   On: 09/12/2019 09:32   Assessment and Plan:   Pt a 69 yo with hx of HTN, PVOD, DM and bradycardia (s/p PPM)   Admitted with CP   Treated for intramural hematoma of aorta.   Post procedure had labile blood pressures   1  Blood pressure   Prior to admission the patient was on multiple meds for BP  Post op, after initiation of home meds the pt became hyptensive and transiently required pressors  These were weaned and now pt is on Cleviprex and some oral meds    Cardiology consulted to help with transition  ON review I would try to wean Cleviprex Would switch patient off of labetalol and place on carvedilol which she had been on preo  The pt had been on Clonidine   WOuld resume at 0.2 mg Would stop Minoxidl Continue hydralazine (had been on )  Continue on chlorthalidone (new) as well as ad spironolactone (new) for now    2.  Thoracic aortic aneurysm with extension.  Pt is  s/p endovascular repair:   Note CT shows RA stenosis, not felt to be significant      Other medical issues per primary team: -Leukocytosis -Chronic back pain -Hypothyroidism -DM2 -Constipation -Acute hypoxic respiratory failure   For questions or updates, please contact [facility] Please consult [email or web address] for contact info under Cardiology/STEMI.   Kristy Safe NP-C [place] Pager: [phone] 09/15/2019 10:31 AM   Patient seen and examined  I hae amended note above by [place] Pt is post endovascular repair of aorta   BP has been labile   Improved now with IV and po meds  ON exam, pt is comfortable   Lungs are  relatively clear Cardiac exam:   RRR  No sginfi murmurs  Abd   No signif tenderness  No masses Ext are without significant edeam    I have amended assessment above to guide tapering and addition of meds   Will continue to follow   Dorris Carnes MD

## 2019-09-15 NOTE — Progress Notes (Addendum)
NAME:  [name], MRN:  [id number], DOB:  01-Aug-1950, LOS: 58 ADMISSION DATE:  08/28/2019, CONSULTATION DATE:  08/28/2019 REFERRING MD:  ED Provider, CHIEF COMPLAINT:  Abdominal pain 2/2 descending aortic aneurysm    Brief History   69 year old female with PMHx of hypertension, tobacco use disorder, atrial fibrillation, hypothyroidism presented to [facility] with abdominal pain for 2 days. She was found to have a descending thoracic aortic aneurysm and was transferred to [facility] for further management.   Past Medical History   Past Medical History:  Diagnosis Date  . Anemia   . Anxiety   . Arthritis   . Atrial fib/flutter, transient   . COPD (chronic obstructive pulmonary disease) ([place])   . Depression   . Diabetes mellitus   . Diverticulosis   . DJD (degenerative joint disease)   . GERD (gastroesophageal reflux disease)   . Gout   . History of pneumonia   . Hypertension   . Hypothyroidism   . Internal hemorrhoids    Significant Hospital Events   8/8 transferred from [facility] 8/20 PCCM consulted for difficult to control HTN. to OR for endovascular repair.  8/22 unable to move legs. back to OR for LE paralysis lumbar drain placed 8/23 hypotensive. Antihypertensives stopped placed on norepinephrine drip with goal to keep blood pressure 140-160, had complete resolution of paralysis and equal strength bilateral lower extremities 8/24: Received 1 unit of blood in the morning.  Lumbar drain clamped and later removed inadvertently by the patient, however was stable for removal,  unfortunately did have spinal headache which is since with resolved.  Norepinephrine was weaned off, required as needed antihypertensives.required lasix for what seemed like volume overload and edema after blood.  8/25 adding back hydralazine. Getting OOB. Getting lasix again       Consults:  Vascular surgery  Procedures:  8/20 :   #1: Endovascular repair of thoracic aortic ulcer/aneurysm without coverage of left  subclavian                         #2: Left external iliac conduit (10 mm dacryon)                         #3: Left external iliac artery endarterectomy                         #4: Thoracic and abdominal aortogram                         #5: Radiology supervision interpretation                         #6: Ultrasound-guided access, left common femoral artery                         #7: Left external iliac stent (10x60)                         #8: Closure device, Mynx      8/22 Lumbar drain placed for LE paralysis  8/23 right radial aline 8/23 right IJ CVL>>> Significant Diagnostic Tests:   CT angio chest/abd/pel 8/08 >> acute appearing intramural hematoma of distal aortic arch and descending thoracic aorta originating at or distal to Lt [place] artery measuring 3.7 x 3.5 cm and  extends to level of Rt renal artery mild centrilobular emphysema, scarring at lung bases  Echo 8/9 >> LVEF 65-70% without RWMA; grade I diastolic dyusfunction; trivial mitral regurgitation   CT angio chest/abd/pel 8/11 >> stable appearance of acute intramural hematoma arising distal to the takeoff of the left subclavian artery and terminating at the level of the proximal abdominal aorta; previously demonstrated possible penetrating ulcer arising from the descending thoracic aorta no longer well appreciated and likely resolved in the interim; bilateral pleural effusions with mild interlobular septal thickening suggestive of volume overload  LUE Vascular US 8/13 >> No DVT. Acute superficial vein thrombosis of L basilic vein and L cephalic vein; acute occlusive superficial thrombophlebitis in mid basilic vein and cephalic vein   CTA chest 8/14 >> stable known intramural thoracic aortic hematoma with stable aneurysm dilation of the ascending thoracic aorta measuring 4 cm in AP diameter.  Slight worsening of small simple left pleural effusion with associated by basilar atelectasis.  Slightly nodular component to some of this  posterior dependent density over the mid lung as cannot exclude superimposed infection   Micro Data:  COVID 8/8 > negative MRSA 8/8 > negative  Antimicrobials:  None   Interim history/subjective:  Doing well no complaints   Objective    Today's Vitals   09/15/19 0530 09/15/19 0554 09/15/19 0600 09/15/19 0700  BP: 131/68 131/68 (Abnormal) 120/58 131/66  Pulse: 86  83 88  Resp: 15  16 20   Temp:      TempSrc:    Oral  SpO2: 91%  (Abnormal) 88% (Abnormal) 89%  Weight:      Height:      PainSc:    0-No pain  CVP:  [3 mmHg-20 mmHg] 10 mmHg Body mass index is 28.55 kg/m.  General very pleasant 69 year old black female currently up in chair she has no acute complaints today  HEENT normocephalic atraumatic still has some mild jugular venous distention  Pulmonary: Currently 4 L/min.  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  Goal systolic blood pressure around 140  Spinal cord ischemia post TEVAR- rapidly improved with lumbar drain Plan Avoiding hypotension.  Goal is to keep systolic blood pressure around 140  Acute hypoxic respiratory failure requiring nasal cannula PCXR personally reviewed: new right > left  airspace disease. Looks like a mix of asymmetric edema and atelectasis this was on 8/24 Oxygen requirement still at 4 L.  Work of breathing improved. She is -4 L Plan Continue supplemental oxygen Incentive spirometry Mobilize Chest x-ray today May need to consider repeating Lasix   Leukocytosis--> etiology unclear, spiked fever max of 100.8 on 8/24 but none since Plan Remove Foley Continue to trend CBC and fever curve  Anemia w/out evidence of bleeding-->hgb did  drop to 6.7 8/24, received 1 unit of blood; hemoglobin stable since Plan Continue to check CBCs daily for now' Transfuse for hemoglobin less than 7  Hypothyroidism- TSH mildly low but in setting of critical illness; thyroid hormone not ideal with severe refractory hypertension Plan Continue Synthroid 25 mcg a day, TSH needs to be obtained the week of the 30th     Chronic back pain- complicating hypertension Plan Discontinue PCA  Continue gabapentin and Zoloft  Oral analgesia as indicated   Hyperglycemia 2/2 poorly controlled type II diabetes mellitus: cbg w/in goal Plan Continue current sliding scale insulin, goal is 1 40-1 80   Constipation: Plan PRN LOC     Best practice:  Diet: carb modified, heart healthy Pain/Anxiety/Delirium protocol (if indicated): PCA VAP protocol (if indicated): none DVT prophylaxis: SCDs GI prophylaxis: pepcid  Glucose control: SSI Mobility: As tolerated Code Status: FULL  Family Communication: updated patient Disposition: ICU for neuro checks and maintaining SBP; may be able to move to progressive care later today    Erick Colace ACNP-BC [place] Pager # [phone] OR # [phone] if no answer

## 2019-09-15 NOTE — Progress Notes (Addendum)
  Progress Note    09/15/2019 7:53 AM 6 Days Post-Op  Subjective:  Nausea/vomiting yesterday but responded well to zofran.  BM yesterday.   Vitals:   09/15/19 0600 09/15/19 0700  BP: (!) 120/58 131/66  Pulse: 83 88  Resp: 16 20  Temp:    SpO2: (!) 88% (!) 89%   Physical Exam: Lungs:  Non labored Incisions:  L cath site and iliac incision healing well Extremities:  Palpable DP pulses Abdomen:  Soft, NT, ND Neurologic: A&O  CBC    Component Value Date/Time   WBC 16.6 (H) 09/15/2019 0256   RBC 3.97 09/15/2019 0256   HGB 10.4 (L) 09/15/2019 0256   HCT 33.2 (L) 09/15/2019 0256   PLT 501 (H) 09/15/2019 0256   MCV 83.6 09/15/2019 0256   MCH 26.2 09/15/2019 0256   MCHC 31.3 09/15/2019 0256   RDW 22.1 (H) 09/15/2019 0256   LYMPHSABS 1.3 05/06/2017 0353   MONOABS 0.3 05/06/2017 0353   EOSABS 0.0 05/06/2017 0353   BASOSABS 0.0 05/06/2017 0353    BMET    Component Value Date/Time   NA 138 09/15/2019 0256   K 3.8 09/15/2019 0256   CL 100 09/15/2019 0256   CO2 28 09/15/2019 0256   GLUCOSE 109 (H) 09/15/2019 0256   BUN 13 09/15/2019 0256   CREATININE 1.08 (H) 09/15/2019 0256   CALCIUM 9.0 09/15/2019 0256   GFRNONAA 52 (L) 09/15/2019 0256   GFRAA >60 09/15/2019 0256    INR    Component Value Date/Time   INR 1.2 09/09/2019 1450     Intake/Output Summary (Last 24 hours) at 09/15/2019 0753 Last data filed at 09/15/2019 0700 Gross per 24 hour  Intake 563.27 ml  Output 6200 ml  Net -5636.73 ml     Assessment/Plan:  69 y.o. female is s/p TEVAR with iliac conduit 6 Days Post-Op   BLE well perfused with DP pulses Motor and sensation intact BLE since spinal drain removed Encouraged OOB today Ok to d/c foley Lytes replaced Cardiology consulted for management of malignant HTN  Dagoberto Ligas, PA-C [facility] [phone] 09/15/2019 7:53 AM  I agree with the above.  I saw and examined the patient with the PA and agree with the above plan.   I appreciate CCM and cardiology assistance.     [name]

## 2019-09-15 NOTE — Progress Notes (Signed)
47mL of Dilaudid wasted in Stericycle with Warden Fillers RN.

## 2019-09-16 ENCOUNTER — Inpatient Hospital Stay (HOSPITAL_COMMUNITY): Payer: Medicare Other

## 2019-09-16 DIAGNOSIS — J9601 Acute respiratory failure with hypoxia: Secondary | ICD-10-CM

## 2019-09-16 DIAGNOSIS — N179 Acute kidney failure, unspecified: Secondary | ICD-10-CM

## 2019-09-16 DIAGNOSIS — J810 Acute pulmonary edema: Secondary | ICD-10-CM

## 2019-09-16 DIAGNOSIS — Z9889 Other specified postprocedural states: Secondary | ICD-10-CM

## 2019-09-16 LAB — BASIC METABOLIC PANEL
Anion gap: 11 (ref 5–15)
BUN: 26 mg/dL — ABNORMAL HIGH (ref 8–23)
CO2: 26 mmol/L (ref 22–32)
Calcium: 8.9 mg/dL (ref 8.9–10.3)
Chloride: 100 mmol/L (ref 98–111)
Creatinine, Ser: 2.57 mg/dL — ABNORMAL HIGH (ref 0.44–1.00)
GFR calc Af Amer: 21 mL/min — ABNORMAL LOW (ref >60)
GFR calc non Af Amer: 18 mL/min — ABNORMAL LOW (ref >60)
Glucose, Bld: 103 mg/dL — ABNORMAL HIGH (ref 70–99)
Potassium: 4.1 mmol/L (ref 3.5–5.1)
Sodium: 137 mmol/L (ref 135–145)

## 2019-09-16 LAB — GLUCOSE, CAPILLARY
Glucose-Capillary: 152 mg/dL — ABNORMAL HIGH (ref 70–99)
Glucose-Capillary: 126 mg/dL — ABNORMAL HIGH (ref 70–99)
Glucose-Capillary: 100 mg/dL — ABNORMAL HIGH (ref 70–99)
Glucose-Capillary: 173 mg/dL — ABNORMAL HIGH (ref 70–99)

## 2019-09-16 LAB — CBC
HCT: 29.2 % — ABNORMAL LOW (ref 36.0–46.0)
Hemoglobin: 8.9 g/dL — ABNORMAL LOW (ref 12.0–15.0)
MCH: 25.4 pg — ABNORMAL LOW (ref 26.0–34.0)
MCHC: 30.5 g/dL (ref 30.0–36.0)
MCV: 83.4 fL (ref 80.0–100.0)
Platelets: 453 10*3/uL — ABNORMAL HIGH (ref 150–400)
RBC: 3.5 MIL/uL — ABNORMAL LOW (ref 3.87–5.11)
RDW: 22.2 % — ABNORMAL HIGH (ref 11.5–15.5)
WBC: 10.6 10*3/uL — ABNORMAL HIGH (ref 4.0–10.5)
nRBC: 0.3 % — ABNORMAL HIGH (ref 0.0–0.2)

## 2019-09-16 MED ORDER — HYDRALAZINE HCL 50 MG PO TABS: 50.0000 mg | ORAL_TABLET | Freq: Three times a day (TID) | ORAL | Status: DC

## 2019-09-16 MED ORDER — PRAVASTATIN SODIUM 10 MG PO TABS
20.0000 mg | ORAL_TABLET | Freq: Every day | ORAL | Status: DC
  Administered 2019-09-16: 20 mg via ORAL
  Filled 2019-09-16: qty 2

## 2019-09-16 MED ORDER — CLONIDINE HCL 0.1 MG PO TABS: 0.1000 mg | ORAL_TABLET | Freq: Three times a day (TID) | ORAL | Status: DC

## 2019-09-16 MED ORDER — CLONIDINE HCL 0.1 MG PO TABS
0.1000 mg | ORAL_TABLET | Freq: Two times a day (BID) | ORAL | Status: DC
  Administered 2019-09-16 – 2019-09-19 (×6): 0.1 mg via ORAL
  Filled 2019-09-16 (×6): qty 1

## 2019-09-16 MED ORDER — SODIUM CHLORIDE 0.9 % IV SOLN: INTRAVENOUS | Status: DC

## 2019-09-16 MED ORDER — CARVEDILOL 3.125 MG PO TABS
3.1250 mg | ORAL_TABLET | Freq: Two times a day (BID) | ORAL | Status: DC
  Administered 2019-09-16: 3.125 mg via ORAL
  Filled 2019-09-16: qty 1

## 2019-09-16 NOTE — Progress Notes (Signed)
      Pt's blood pressure remains very well controlled   BP meds have been pared back  WIll need to follow  Cr increased to 2.57, in part prob to wide fluctuations of pressure    Will continue to follow   Signed, Dorris Carnes, MD  09/16/2019, 4:22 PM

## 2019-09-16 NOTE — Progress Notes (Addendum)
  Progress Note    09/16/2019 7:05 AM 7 Days Post-Op  Subjective:  C/o soreness right wrist; sitting up in chair; says she sat up in chair most of the day yesterday.  Tm 99.2 now afebrile HR 70's-90's NSR 00'F-749'S systolic  496% 7RF1MB  Gtts:  none  Vitals:   09/16/19 0530 09/16/19 0600  BP: (!) 98/51 (!) 113/57  Pulse: 75 77  Resp: 19 16  Temp:    SpO2: 98% 97%    Physical Exam: Cardiac:  regular Lungs:  Non labored Incisions:  Left iliac and left groin sites clean  Extremities:  Easily palpable bilateral radial/DP pulses Abdomen:  Soft, NT/ND  CBC    Component Value Date/Time   WBC 10.6 (H) 09/16/2019 0355   RBC 3.50 (L) 09/16/2019 0355   HGB 8.9 (L) 09/16/2019 0355   HCT 29.2 (L) 09/16/2019 0355   PLT 453 (H) 09/16/2019 0355   MCV 83.4 09/16/2019 0355   MCH 25.4 (L) 09/16/2019 0355   MCHC 30.5 09/16/2019 0355   RDW 22.2 (H) 09/16/2019 0355   LYMPHSABS 1.3 05/06/2017 0353   MONOABS 0.3 05/06/2017 0353   EOSABS 0.0 05/06/2017 0353   BASOSABS 0.0 05/06/2017 0353    BMET    Component Value Date/Time   NA 137 09/16/2019 0355   K 4.1 09/16/2019 0355   CL 100 09/16/2019 0355   CO2 26 09/16/2019 0355   GLUCOSE 103 (H) 09/16/2019 0355   BUN 26 (H) 09/16/2019 0355   CREATININE 2.57 (H) 09/16/2019 0355   CALCIUM 8.9 09/16/2019 0355   GFRNONAA 18 (L) 09/16/2019 0355   GFRAA 21 (L) 09/16/2019 0355    INR    Component Value Date/Time   INR 1.2 09/09/2019 1450     Intake/Output Summary (Last 24 hours) at 09/16/2019 0705 Last data filed at 09/15/2019 2300 Gross per 24 hour  Intake 777.6 ml  Output 50 ml  Net 727.6 ml     Assessment:  69 y.o. female is s/p:  TEVAR with iliac conduit  7 Days Post-Op  Plan: -easily palpable DP pulses bilaterally as well as palpable bilateral radial pulses.  Motor and sensation continue to be intact.  -foley removed yesterday-pt has not been able to void and bladder scan revealed 0cc.  Pt's creatinine is 2.57 this  am, which is increased from 1.08 yesterday.  May need renal consult-will defer to primary team.  -warm compresses to right wrist at radial a-line site -DVT prophylaxis:  Sq heparin  -PT/OT recommending HHPT/OT   Leontine Locket, PA-C [facility] [phone] 09/16/2019 7:05 AM   Appreciate cardiology assistance with BP. Now off IV meds.  Renal:  No UOP overnight.  Foley removed yesterday.  Bladder scan without evidence of retention.  Suspect secondary to hypotension.  Will increase IV fluids to 100.  May need to rpelace foley to monitor UOP.  Will get renal duplex to make sure this is not related to her dissection.  WElls Lisabeth Mian

## 2019-09-16 NOTE — Progress Notes (Addendum)
Progress Note  Patient Name: [name] Date of Encounter: 09/16/2019  [facility] Cardiologist: New (Dr Lovena Le remote)  Subjective   Pt appears comfortable in bed  No CP    Inpatient Medications    Scheduled Meds: . carvedilol  25 mg Oral BID WC  . Chlorhexidine Gluconate Cloth  6 each Topical Daily  . chlorthalidone  25 mg Oral Daily  . cholecalciferol  1,000 Units Oral q morning - 10a  . cloNIDine  0.2 mg Oral TID  . docusate sodium  100 mg Oral Daily  . famotidine  20 mg Oral Daily  . gabapentin  100 mg Oral TID  . heparin injection (subcutaneous)  5,000 Units Subcutaneous Q8H  . hydrALAZINE  100 mg Oral Q8H  . insulin aspart  0-15 Units Subcutaneous TID WC  . insulin aspart  0-5 Units Subcutaneous QHS  . levothyroxine  25 mcg Oral Q0600  . melatonin  9 mg Oral QHS  . nicotine  7 mg Transdermal Daily  . pantoprazole  40 mg Oral Daily  . polyethylene glycol  17 g Oral BID  . pravastatin  20 mg Oral q1800  . sertraline  50 mg Oral QHS  . spironolactone  50 mg Oral Daily   Continuous Infusions: . sodium chloride Stopped (08/30/19 0233)  . clevidipine Stopped (09/15/19 2235)  . lactated ringers    . magnesium sulfate bolus IVPB     PRN Meds: sodium chloride, acetaminophen **OR** acetaminophen, alum & mag hydroxide-simeth, diphenhydrAMINE, docusate sodium, guaiFENesin-dextromethorphan, HYDROmorphone (DILAUDID) injection, lidocaine, magnesium sulfate bolus IVPB, metoprolol tartrate, ondansetron, ondansetron, oxyCODONE, phenol, pneumococcal 23 valent vaccine, sodium phosphate   Vital Signs    Vitals:   09/16/19 0000 09/16/19 0100 09/16/19 0130 09/16/19 0343  BP: (!) 103/53 (!) 100/49 (!) 107/55   Pulse: 76 73 72   Resp: 18 (!) 8 16   Temp:    98.2 F (36.8 C)  TempSrc:    Axillary  SpO2: 100% 100% 100%   Weight:      Height:        Intake/Output Summary (Last 24 hours) at 09/16/2019 0402 Last data filed at 09/15/2019 2300 Gross per 24 hour   Intake 928.67 ml  Output 215 ml  Net 713.67 ml   Last 3 Weights 09/15/2019 09/14/2019 09/13/2019  Weight (lbs) 146 lb 2.6 oz 153 lb 3.5 oz 153 lb 10.6 oz  Weight (kg) 66.3 kg 69.5 kg 69.7 kg      Telemetry    SR  - Personally Reviewed  ECG    No new  - Personally Reviewed  Physical Exam   GEN: No acute distress.   Cardiac: RRR, II/VI systolic murmur  Respiratory: Clear to auscultation bilaterally. GI: Soft, nontender, non-distended  MS: No edema; No deformity. Neuro:  Nonfocal  Psych: Normal affect   Labs    High Sensitivity Troponin:   Recent Labs  Lab 08/28/19 1011 08/28/19 1212  TROPONINIHS 10 9      Chemistry Recent Labs  Lab 09/13/19 2208 09/14/19 0500 09/15/19 0256  NA 136 136 138  K 4.3 3.7 3.8  CL 100 102 100  CO2 25 25 28   GLUCOSE 132* 121* 109*  BUN 20 16 13   CREATININE 1.05* 0.99 1.08*  CALCIUM 9.3 9.2 9.0  GFRNONAA 54* 58* 52*  GFRAA >60 >60 >60  ANIONGAP 11 9 10      Hematology Recent Labs  Lab 09/13/19 0346 09/13/19 0346 09/13/19 1548 09/14/19 0500 09/15/19 0256  WBC  13.3*  --   --  16.5* 16.6*  RBC 2.67*  --   --  3.58* 3.97  HGB 6.7*   < > 9.4* 9.6* 10.4*  HCT 21.3*   < > 30.4* 29.5* 33.2*  MCV 79.8*  --   --  82.4 83.6  MCH 25.1*  --   --  26.8 26.2  MCHC 31.5  --   --  32.5 31.3  RDW 20.5*  --   --  21.8* 22.1*  PLT 359  --   --  445* 501*   < > = values in this interval not displayed.    BNP Recent Labs  Lab 09/15/19 1223  BNP 527.8*     DDimer No results for input(s): DDIMER in the last 168 hours.   Radiology    DG Chest Port 1 View  Result Date: 09/15/2019 CLINICAL DATA:  Hypoxia EXAM: PORTABLE CHEST 1 VIEW COMPARISON:  September 13, 2019 FINDINGS: The cardiomediastinal silhouette is unchanged in contour.Status post endovascular repair of the descending thoracic aorta. RIGHT-sided cardiac pacing device. RIGHT IJ CVC tip terminates over the RIGHT atrium. Trace fluid in the RIGHT fissure. No pneumothorax. Improved  aeration bilaterally with improved pulmonary edema and decreased atelectasis. Visualized abdomen is unremarkable. Multilevel degenerative changes of the thoracic spine. IMPRESSION: Improved aeration bilaterally with improved pulmonary edema and decreased atelectasis. Electronically Signed   By: Valentino Saxon MD   On: 09/15/2019 10:46    Cardiac Studies   Echocardiogram 08/29/2019: 1. Left ventricular ejection fraction, by estimation, is 65 to 70%. The  left ventricle has normal function. The left ventricle has no regional  wall motion abnormalities. There is mild concentric left ventricular  hypertrophy. Left ventricular diastolic  parameters are consistent with Grade I diastolic dysfunction (impaired  relaxation).  2. Right ventricular systolic function is normal. The right ventricular  size is normal. There is moderately elevated pulmonary artery systolic  pressure. The estimated right ventricular systolic pressure is 59.1 mmHg.  3. Left atrial size was mild to moderately dilated.  4. Right atrial size was mild to moderately dilated.  5. The mitral valve is degenerative. Trivial mitral valve regurgitation.  No evidence of mitral stenosis.  6. The aortic valve is tricuspid. Aortic valve regurgitation is not  visualized. Mild aortic valve stenosis. Aortic valve area, by VTI measures  1.97 cm. Aortic valve mean gradient measures 12.5 mmHg. Aortic valve Vmax  measures 2.35 m/s.  7. The inferior vena cava is dilated in size with >50% respiratory  variability, suggesting right atrial pressure of 8 mmHg.    Patient Profile     [name] is a 69 y.o. 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  Assessment & Plan    1  HTN  Malignant a couple days ago Now SBP is in low 100s  She is off of Cleviprex I will pull back a little on meds (stop aldactone, stop chlorathalidone, stop hydralazine, cut back on coreg and clonidine)   Follow   Can always readd    2  PVOD   Pt is s/p endovascular repair of thoracic aneurysm CT scan did show high grade stenosis of L prox renal artery  BP is low  Cant be hemodynmically significant  3  Hx bradycardia  S/p dual chamber Medtronic PPM  Last seen in 2016  No pacer checks since then as well   WIll have it interrogated   Pacemaker was interrogated by [employer] today     Thresholds for both were 1 @ 0.4 Impedence A 533  RV 593 P wave 1.4 R wave 11.2 There were 9771 AHR   The longesrt was ll minutes   I would not make any changes   F/U in EP    4  Afib  One 3 min episode documented in 2016  Then not seen   Only spell was 11 min   I would hold for now.   5  DM  For questions or updates, please contact [place] Please consult [email or web address] for contact info under        Signed, Dorris Carnes, MD  09/16/2019, 4:02 AM

## 2019-09-16 NOTE — Progress Notes (Signed)
NAME:  [name], MRN:  [id number], DOB:  1950-05-31, LOS: 31 ADMISSION DATE:  08/28/2019, CONSULTATION DATE:  08/28/2019 REFERRING MD:  ED Provider, CHIEF COMPLAINT:  Abdominal pain 2/2 descending aortic aneurysm    Brief History   69 year old female with PMHx of hypertension, tobacco use disorder, atrial fibrillation, hypothyroidism presented to [facility] with abdominal pain for 2 days. She was found to have a descending thoracic aortic aneurysm and was transferred to [facility] for further management.   Past Medical History   Past Medical History:  Diagnosis Date  . Anemia   . Anxiety   . Arthritis   . Atrial fib/flutter, transient   . COPD (chronic obstructive pulmonary disease) ([place])   . Depression   . Diabetes mellitus   . Diverticulosis   . DJD (degenerative joint disease)   . GERD (gastroesophageal reflux disease)   . Gout   . History of pneumonia   . Hypertension   . Hypothyroidism   . Internal hemorrhoids    Significant Hospital Events   8/8 transferred from [facility] 8/20 PCCM consulted for difficult to control HTN. to OR for endovascular repair.  8/22 unable to move legs. back to OR for LE paralysis lumbar drain placed 8/23 hypotensive. Antihypertensives stopped placed on norepinephrine drip with goal to keep blood pressure 140-160, had complete resolution of paralysis and equal strength bilateral lower extremities 8/24: Received 1 unit of blood in the morning.  Lumbar drain clamped and later removed inadvertently by the patient, however was stable for removal,  unfortunately did have spinal headache which is since with resolved.  Norepinephrine was weaned off, required as needed antihypertensives.required lasix for what seemed like volume overload and edema after blood.  8/25 adding back hydralazine. Getting OOB. Getting lasix again       Consults:  Vascular surgery  Procedures:  8/20 :   #1: Endovascular repair of thoracic aortic ulcer/aneurysm without coverage of left  subclavian                         #2: Left external iliac conduit (10 mm dacryon)                         #3: Left external iliac artery endarterectomy                         #4: Thoracic and abdominal aortogram                         #5: Radiology supervision interpretation                         #6: Ultrasound-guided access, left common femoral artery                         #7: Left external iliac stent (10x60)                         #8: Closure device, Mynx      8/22 Lumbar drain placed for LE paralysis  8/23 right radial aline 8/23 right IJ CVL>>> Significant Diagnostic Tests:   CT angio chest/abd/pel 8/08 >> acute appearing intramural hematoma of distal aortic arch and descending thoracic aorta originating at or distal to Lt [place] artery measuring 3.7 x 3.5 cm and  extends to level of Rt renal artery mild centrilobular emphysema, scarring at lung bases  Echo 8/9 >> LVEF 65-70% without RWMA; grade I diastolic dyusfunction; trivial mitral regurgitation   CT angio chest/abd/pel 8/11 >> stable appearance of acute intramural hematoma arising distal to the takeoff of the left subclavian artery and terminating at the level of the proximal abdominal aorta; previously demonstrated possible penetrating ulcer arising from the descending thoracic aorta no longer well appreciated and likely resolved in the interim; bilateral pleural effusions with mild interlobular septal thickening suggestive of volume overload  LUE Vascular US 8/13 >> No DVT. Acute superficial vein thrombosis of L basilic vein and L cephalic vein; acute occlusive superficial thrombophlebitis in mid basilic vein and cephalic vein   CTA chest 8/14 >> stable known intramural thoracic aortic hematoma with stable aneurysm dilation of the ascending thoracic aorta measuring 4 cm in AP diameter.  Slight worsening of small simple left pleural effusion with associated by basilar atelectasis.  Slightly nodular component to some of this  posterior dependent density over the mid lung as cannot exclude superimposed infection   Micro Data:  COVID 8/8 > negative MRSA 8/8 > negative  Antimicrobials:  None   Interim history/subjective:  Doing well no complaints   Objective    Today's Vitals   09/16/19 0530 09/16/19 0600 09/16/19 0630 09/16/19 0700  BP: (Abnormal) 98/51 (Abnormal) 113/57 (Abnormal) 94/45 (Abnormal) 99/49  Pulse: 75 77 76 79  Resp: 19 16 (Abnormal) 0 19  Temp:      TempSrc:      SpO2: 98% 97% 96% 100%  Weight:      Height:      PainSc:    0-No pain  CVP:  [0 mmHg-17 mmHg] 10 mmHg Body mass index is 28.85 kg/m.  General very pleasant 69 year old black female currently up in chair she has no acute complaints today  HEENT normocephalic atraumatic still has some mild jugular venous distention  Pulmonary: Currently 4 L/min.  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  Her regimen was deescalated some as systolic blood pressure in low 100s now, and renal function a little worse  Spinal cord ischemia post TEVAR- rapidly improved with lumbar drain Plan Avoid  hypotension Systolic blood pressure goal around 140  Acute hypoxic respiratory failure requiring nasal cannula Portable chest x-ray personally reviewed shows decreased edema and improved aeration from basilar atelectasis Oxygen weaned down to 2 L with pulse oximetry 100%  Plan Continue supplemental oxygen, wean for greater than 90% Continue incentive spirometry Holding diuretics given new AKI  Acute kidney injury Suspect mix of overdiuresis and once again overshooting antihypertensives Plan Agree with gentle  IV hydration We will need to watch her blood pressure closely as she is been very difficult to control in the past A.m. chemistry Follow-up renal ultrasound  Leukocytosis--> etiology unclear, spiked fever max of 100.8 on 8/24 but none since, white blood cell count coming down now that Foley catheter out Plan Trend fever curve  Anemia w/out evidence of bleeding-->hgb did drop to 6.7 8/24, received 1 unit of blood; hemoglobin stable since Plan Intermittent CBC Transfuse for hemoglobin less than 7  Hypothyroidism- TSH mildly low but in setting of critical illness; thyroid hormone not ideal with severe refractory hypertension Plan Continue Synthroid 25 mcg a day  TSH needs to be rechecked the week of the 30th   Chronic back pain- complicating hypertension Plan Continue gabapentin and Zoloft  As needed analgesia  Hyperglycemia 2/2 poorly controlled type II diabetes mellitus: cbg w/in goal Plan Continue current regimen     Best practice:  Diet: carb modified, heart healthy Pain/Anxiety/Delirium protocol (if indicated): PCA VAP protocol (if indicated): none DVT prophylaxis: SCDs GI prophylaxis: pepcid  Glucose control: SSI Mobility: As tolerated Code Status: FULL  Family Communication: updated patient Disposition: Move her to progressive tried to assume care cardiology assisting with antihypertensive management   Erick Colace ACNP-BC [place] Pager # [phone] OR # [phone] if no answer

## 2019-09-16 NOTE — Progress Notes (Signed)
Patient arrived to 4E room 03 at this time. Telemetry applied and CCMD notified. CHG bath done. Patient oriented to room and how to call nurse with any needs.

## 2019-09-16 NOTE — Progress Notes (Signed)
Pt voided 275 cc of concentrated urine, post void residual was about 250 via bladder scan

## 2019-09-16 NOTE — Progress Notes (Signed)
Patient off cleviprex last night.  MAPs decreased to 65.    No voids since 8/26 mid-morning s/p Foley D/C when up to [facility] w/PT OT (unmeasured occurrence).    Patient's bladder scan 0 this AM, recheck at noon 77ml  Renal Duplex pending    MAP >65 maintaining closer to 75 s/p IVF NS started per Vascular this AM  Stable on RA, SaO2 >95%  Verified w/CCM Marni Griffon patient is cleared for transfer to floor. VSS.   Calling report. Patient transferring to 4E /03  Marlis Edelson, RN

## 2019-09-17 LAB — GLUCOSE, CAPILLARY
Glucose-Capillary: 119 mg/dL — ABNORMAL HIGH (ref 70–99)
Glucose-Capillary: 118 mg/dL — ABNORMAL HIGH (ref 70–99)
Glucose-Capillary: 154 mg/dL — ABNORMAL HIGH (ref 70–99)
Glucose-Capillary: 109 mg/dL — ABNORMAL HIGH (ref 70–99)

## 2019-09-17 LAB — BASIC METABOLIC PANEL
Anion gap: 11 (ref 5–15)
BUN: 32 mg/dL — ABNORMAL HIGH (ref 8–23)
CO2: 26 mmol/L (ref 22–32)
Calcium: 9.1 mg/dL (ref 8.9–10.3)
Chloride: 98 mmol/L (ref 98–111)
Creatinine, Ser: 2.24 mg/dL — ABNORMAL HIGH (ref 0.44–1.00)
GFR calc Af Amer: 25 mL/min — ABNORMAL LOW (ref >60)
GFR calc non Af Amer: 22 mL/min — ABNORMAL LOW (ref >60)
Glucose, Bld: 132 mg/dL — ABNORMAL HIGH (ref 70–99)
Potassium: 4 mmol/L (ref 3.5–5.1)
Sodium: 135 mmol/L (ref 135–145)

## 2019-09-17 LAB — CBC
HCT: 29.5 % — ABNORMAL LOW (ref 36.0–46.0)
Hemoglobin: 9.4 g/dL — ABNORMAL LOW (ref 12.0–15.0)
MCH: 26 pg (ref 26.0–34.0)
MCHC: 31.9 g/dL (ref 30.0–36.0)
MCV: 81.7 fL (ref 80.0–100.0)
Platelets: 509 10*3/uL — ABNORMAL HIGH (ref 150–400)
RBC: 3.61 MIL/uL — ABNORMAL LOW (ref 3.87–5.11)
RDW: 22.1 % — ABNORMAL HIGH (ref 11.5–15.5)
WBC: 11.6 10*3/uL — ABNORMAL HIGH (ref 4.0–10.5)
nRBC: 0.3 % — ABNORMAL HIGH (ref 0.0–0.2)

## 2019-09-17 MED ORDER — ROSUVASTATIN CALCIUM 20 MG PO TABS
20.0000 mg | ORAL_TABLET | Freq: Every day | ORAL | Status: DC
  Administered 2019-09-17 – 2019-09-19 (×3): 20 mg via ORAL
  Filled 2019-09-17 (×3): qty 1

## 2019-09-17 MED ORDER — CARVEDILOL 12.5 MG PO TABS
12.5000 mg | ORAL_TABLET | Freq: Two times a day (BID) | ORAL | Status: DC
  Administered 2019-09-17 – 2019-09-19 (×5): 12.5 mg via ORAL
  Filled 2019-09-17 (×5): qty 1

## 2019-09-17 MED ORDER — ASPIRIN 81 MG PO CHEW
81.0000 mg | CHEWABLE_TABLET | Freq: Once | ORAL | Status: AC
  Administered 2019-09-17: 81 mg via ORAL
  Filled 2019-09-17: qty 1

## 2019-09-17 NOTE — Progress Notes (Signed)
Cardiology Progress Note  Patient ID: [name] MRN: [id number] DOB: Jun 23, 1950 Date of Encounter: 09/17/2019  Primary Cardiologist: No primary care provider on file.  Subjective   Chief Complaint: None.  HPI: Blood pressure improved.  Only on Coreg and clonidine.  ROS:  All other ROS reviewed and negative. Pertinent positives noted in the HPI.     Inpatient Medications  Scheduled Meds: . carvedilol  12.5 mg Oral BID WC  . Chlorhexidine Gluconate Cloth  6 each Topical Daily  . cholecalciferol  1,000 Units Oral q morning - 10a  . cloNIDine  0.1 mg Oral BID  . docusate sodium  100 mg Oral Daily  . famotidine  20 mg Oral Daily  . gabapentin  100 mg Oral TID  . heparin injection (subcutaneous)  5,000 Units Subcutaneous Q8H  . insulin aspart  0-15 Units Subcutaneous TID WC  . insulin aspart  0-5 Units Subcutaneous QHS  . levothyroxine  25 mcg Oral Q0600  . melatonin  9 mg Oral QHS  . nicotine  7 mg Transdermal Daily  . pantoprazole  40 mg Oral Daily  . polyethylene glycol  17 g Oral BID  . rosuvastatin  20 mg Oral Daily  . sertraline  50 mg Oral QHS   Continuous Infusions: . sodium chloride Stopped (08/30/19 0233)  . sodium chloride 100 mL/hr at 09/16/19 1300  . magnesium sulfate bolus IVPB     PRN Meds: sodium chloride, acetaminophen **OR** acetaminophen, alum & mag hydroxide-simeth, diphenhydrAMINE, docusate sodium, guaiFENesin-dextromethorphan, HYDROmorphone (DILAUDID) injection, lidocaine, magnesium sulfate bolus IVPB, ondansetron, ondansetron, oxyCODONE, phenol, pneumococcal 23 valent vaccine, sodium phosphate   Vital Signs   Vitals:   09/16/19 1705 09/16/19 1945 09/16/19 2255 09/17/19 0348  BP:  116/60 (!) 111/51 (!) 151/62  Pulse: 92 87 84 81  Resp:  16 20 18   Temp:  99.8 F (37.7 C) 99.3 F (37.4 C) 99.7 F (37.6 C)  TempSrc:  Oral Oral Oral  SpO2:  93% 98% 94%  Weight:    68 kg  Height:        Intake/Output Summary (Last 24 hours) at  09/17/2019 0828 Last data filed at 09/17/2019 0300 Gross per 24 hour  Intake 206.37 ml  Output 655 ml  Net -448.63 ml   Last 3 Weights 09/17/2019 09/16/2019 09/15/2019  Weight (lbs) 149 lb 14.4 oz 147 lb 11.3 oz 146 lb 2.6 oz  Weight (kg) 67.994 kg 67 kg 66.3 kg      Telemetry  Overnight telemetry shows normal sinus rhythm with heart rate in the 90s, which I personally reviewed.   ECG  The most recent ECG shows normal sinus rhythm heart rate 70, LVH by voltage, which I personally reviewed.   Physical Exam   Vitals:   09/16/19 1705 09/16/19 1945 09/16/19 2255 09/17/19 0348  BP:  116/60 (!) 111/51 (!) 151/62  Pulse: 92 87 84 81  Resp:  16 20 18   Temp:  99.8 F (37.7 C) 99.3 F (37.4 C) 99.7 F (37.6 C)  TempSrc:  Oral Oral Oral  SpO2:  93% 98% 94%  Weight:    68 kg  Height:         Intake/Output Summary (Last 24 hours) at 09/17/2019 0828 Last data filed at 09/17/2019 0300 Gross per 24 hour  Intake 206.37 ml  Output 655 ml  Net -448.63 ml    Last 3 Weights 09/17/2019 09/16/2019 09/15/2019  Weight (lbs) 149 lb 14.4 oz 147 lb 11.3 oz 146 lb 2.6  oz  Weight (kg) 67.994 kg 67 kg 66.3 kg    Body mass index is 29.28 kg/m.  General: Well nourished, well developed, in no acute distress Head: Atraumatic, normal size  Eyes: PEERLA, EOMI  Neck: Supple, no JVD Endocrine: No thryomegaly Cardiac: Normal S1, S2; 3/6 systolic ejection murmur Lungs: Clear to auscultation bilaterally, no wheezing, rhonchi or rales  Abd: Soft, nontender, no hepatomegaly  Ext: No edema, pulses 2+ Musculoskeletal: No deformities, BUE and BLE strength normal and equal Skin: Warm and dry, no rashes   Neuro: Alert and oriented to person, place, time, and situation, CNII-XII grossly intact, no focal deficits  Psych: Normal mood and affect   Labs  High Sensitivity Troponin:   Recent Labs  Lab 08/28/19 1011 08/28/19 1212  TROPONINIHS 10 9     Cardiac EnzymesNo results for input(s): TROPONINI in the last  168 hours. No results for input(s): TROPIPOC in the last 168 hours.  Chemistry Recent Labs  Lab 09/15/19 0256 09/16/19 0355 09/17/19 0420  NA 138 137 135  K 3.8 4.1 4.0  CL 100 100 98  CO2 28 26 26   GLUCOSE 109* 103* 132*  BUN 13 26* 32*  CREATININE 1.08* 2.57* 2.24*  CALCIUM 9.0 8.9 9.1  GFRNONAA 52* 18* 22*  GFRAA >60 21* 25*  ANIONGAP 10 11 11     Hematology Recent Labs  Lab 09/15/19 0256 09/16/19 0355 09/17/19 0420  WBC 16.6* 10.6* 11.6*  RBC 3.97 3.50* 3.61*  HGB 10.4* 8.9* 9.4*  HCT 33.2* 29.2* 29.5*  MCV 83.6 83.4 81.7  MCH 26.2 25.4* 26.0  MCHC 31.3 30.5 31.9  RDW 22.1* 22.2* 22.1*  PLT 501* 453* 509*   BNP Recent Labs  Lab 09/15/19 1223  BNP 527.8*    DDimer No results for input(s): DDIMER in the last 168 hours.   Radiology  DG Chest Port 1 View  Result Date: 09/15/2019 CLINICAL DATA:  Hypoxia EXAM: PORTABLE CHEST 1 VIEW COMPARISON:  September 13, 2019 FINDINGS: The cardiomediastinal silhouette is unchanged in contour.Status post endovascular repair of the descending thoracic aorta. RIGHT-sided cardiac pacing device. RIGHT IJ CVC tip terminates over the RIGHT atrium. Trace fluid in the RIGHT fissure. No pneumothorax. Improved aeration bilaterally with improved pulmonary edema and decreased atelectasis. Visualized abdomen is unremarkable. Multilevel degenerative changes of the thoracic spine. IMPRESSION: Improved aeration bilaterally with improved pulmonary edema and decreased atelectasis. Electronically Signed   By: Valentino Saxon MD   On: 09/15/2019 10:46   VAS US RENAL ARTERY DUPLEX  Result Date: 09/16/2019 ABDOMINAL VISCERAL Indications: Aorta dissection. Status post TEVAR with iliac conduit 09/09/19.              History of known renal artery stenosis. High Risk Factors: Hypertension, Diabetes. Performing Technologist: Oda Cogan RDMS, RVT  Examination Guidelines: A complete evaluation includes B-mode imaging, spectral Doppler, color Doppler, and  power Doppler as needed of all accessible portions of each vessel. Bilateral testing is considered an integral part of a complete examination. Limited examinations for reoccurring indications may be performed as noted.  Duplex Findings: +--------------------+--------+--------+------+--------+ Mesenteric          PSV cm/sEDV cm/sPlaqueComments +--------------------+--------+--------+------+--------+ Aorta Prox             88                          +--------------------+--------+--------+------+--------+ Celiac Artery Origin  256                          +--------------------+--------+--------+------+--------+  SMA Proximal          362                          +--------------------+--------+--------+------+--------+    +------------------+--------+--------+-------+ Right Renal ArteryPSV cm/sEDV cm/sComment +------------------+--------+--------+-------+ Origin              139      13           +------------------+--------+--------+-------+ Proximal            172      16           +------------------+--------+--------+-------+ Mid                 143      11           +------------------+--------+--------+-------+ Distal               82      0            +------------------+--------+--------+-------+ +-----------------+--------+--------+-------+ Left Renal ArteryPSV cm/sEDV cm/sComment +-----------------+--------+--------+-------+ Proximal           258      23           +-----------------+--------+--------+-------+ Mid                195      22           +-----------------+--------+--------+-------+ Distal             157      27           +-----------------+--------+--------+-------+  Technologist observations: Patent renal artery with high resistance waveform noted throughout the renal artery bilaterally. +------------+--------+--------+----+-----------+--------+--------+----+ Right KidneyPSV cm/sEDV cm/sRI  Left KidneyPSV cm/sEDV  cm/sRI   +------------+--------+--------+----+-----------+--------+--------+----+ Upper Pole  33      0       1.00Upper Pole 19      8       0.55 +------------+--------+--------+----+-----------+--------+--------+----+ Mid         45      0       1.00Mid        29      7       0.76 +------------+--------+--------+----+-----------+--------+--------+----+ Lower Pole  34      0       1.00Lower Pole 29      6       0.78 +------------+--------+--------+----+-----------+--------+--------+----+ Hilar       48      0       1.00Hilar      54      12      0.79 +------------+--------+--------+----+-----------+--------+--------+----+ +------------------+-----+------------------+-----+ Right Kidney           Left Kidney             +------------------+-----+------------------+-----+ RAR                    RAR                     +------------------+-----+------------------+-----+ RAR (manual)      1.9  RAR (manual)      2.9   +------------------+-----+------------------+-----+ Cortex                 Cortex                  +------------------+-----+------------------+-----+ Cortex thickness       Corex thickness         +------------------+-----+------------------+-----+  Kidney length (cm)11.38Kidney length (cm)11.26 +------------------+-----+------------------+-----+  Summary: Renal:  Right: 1-59% stenosis of the right renal artery. Abnormal right        Resistive Index. Normal size right kidney. Left:  1-59% stenosis of the left renal artery. Abnormal size for        the left kidney. Abnormal left Resisitve Index. Normal size        of left kidney.  *See table(s) above for measurements and observations.  Diagnosing physician: Servando Snare MD  Electronically signed by Servando Snare MD on 09/16/2019 at 4:56:31 PM.    Final     Cardiac Studies  TTE 08/29/2019 1. Left ventricular ejection fraction, by estimation, is 65 to 70%. The  left ventricle has normal  function. The left ventricle has no regional  wall motion abnormalities. There is mild concentric left ventricular  hypertrophy. Left ventricular diastolic  parameters are consistent with Grade I diastolic dysfunction (impaired  relaxation).  2. Right ventricular systolic function is normal. The right ventricular  size is normal. There is moderately elevated pulmonary artery systolic  pressure. The estimated right ventricular systolic pressure is 83.4 mmHg.  3. Left atrial size was mild to moderately dilated.  4. Right atrial size was mild to moderately dilated.  5. The mitral valve is degenerative. Trivial mitral valve regurgitation.  No evidence of mitral stenosis.  6. The aortic valve is tricuspid. Aortic valve regurgitation is not  visualized. Mild aortic valve stenosis. Aortic valve area, by VTI measures  1.97 cm. Aortic valve mean gradient measures 12.5 mmHg. Aortic valve Vmax  measures 2.35 m/s.  7. The inferior vena cava is dilated in size with >50% respiratory  variability, suggesting right atrial pressure of 8 mmHg.   Patient Profile  [name] is a 69 y.o. female with hypertension, paroxysmal atrial fibrillation (not on anticoagulation due to brief episode), hypertension, PVD, diabetes who is admitted on 08/28/2019 for descending thoracic aortic intramural hematoma/dissection.  She underwent TEVAR on 09/09/2019.  Cardiology consulted on 09/15/2019 for management of accelerated hypertension.  Assessment & Plan   1.  Hypertension -BP better.  I increased her Coreg to 12.5 mg twice daily.  Continue clonidine 0.1 twice daily.  She has numerous allergies. -Has an AKI.  Thought to be related to blood pressure.  Appears to be coming down. -We will see how she does on this regimen.  The next medication we would add would be a thiazide diuretic.  She apparently is allergic to amlodipine.  ACE ARB clearly other question right now with AKI.  2.  Descending thoracic aorta  intramural hematoma/dissection -Status post TEVAR. -We will start aspirin 81 mg daily.  I changed her statin to Crestor.  3.  Paroxysmal atrial fibrillation -Brief episode in 2016.  Not on anticoagulation.  No further recurrence.  4. SSS s/p ppm -no issues with ppm  For questions or updates, please contact [place] Please consult [email or web address] for contact info under   Time Spent with Patient: I have spent a total of 25 minutes with patient reviewing hospital notes, telemetry, EKGs, labs and examining the patient as well as establishing an assessment and plan that was discussed with the patient.  > 50% of time was spent in direct patient care.    Signed, Addison Naegeli. Audie Box, [place]  09/17/2019 8:28 AM

## 2019-09-17 NOTE — Progress Notes (Signed)
PROGRESS NOTE  [name] XQJ:[id number] DOB: 1950-02-05 DOA: 08/28/2019 PCP: Antonietta Jewel, MD   LOS: 20 days   Brief narrative: As per HPI,  69 year old female with PMHx of hypertension, tobacco use disorder, atrial fibrillation, hypothyroidism presented to [facility] with abdominal pain for 2 days. She was found to have a descending thoracic aortic aneurysm and was transferred to [facility] for further management.  Patient did have difficult to control hypertension and was taken to the OR on 8/20 endovascular repair.  Patient subsequently was hypotensive and was put on norepinephrine drip.  Patient also received 1 unit of packed RBC.  Subsequently, norepinephrine was weaned off and was put back on oral antihypertensives.  Patient was then considered stable for transfer out of the ICU.  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  Vascular surgery on board.  Cardiology on board and have added aspirin, and Crestor.  Resistant hypertension Currently on Coreg, clonidine.  Angioedema with lisinopril and amlodipine.  Will need to closely monitor blood pressure.  Coreg dose has been increased today.  History of angioedema with lisinopril and amlodipine Was on Cleviprex infusion, cardiology was consulted.  Patient was on hydralazine 50 mg every 8 hours, clonidine 0.1 mg every 8 hours and Coreg 25 mg every 8 hours as well as spironolactone 50 mg a day and chlorthalidone 25 mg a day at home.  Regimen has been de-escalatied at this time due to renal dysfunction and blood pressure issues.  Spinal cord ischemia post TEVAR.  improved with lumbar drain. Avoid hypotension. Systolic blood  pressure goal around 140  Acute hypoxic respiratory failure  Improved at this time.  Chest x-ray showed atelectasis.  Continue to wean oxygen.  Continue incentive spirometry.    Acute kidney injury Likely multifactorial from overdiuresis, hypertension.  Creatinine of 2.2.  Will closely need to monitor.  Received gentle IV fluid hydration.  Continue to monitor BMP.  Leukocytosis with low-grade fever.  Has been improving since Foley catheter is out.  Trend WBC.  Temperature max of 99.8 F.  Anemia, no active bleeding.  Likely postsurgical.  Received 1 unit of packed RBC.  Hemoglobin of 9.4 at this time.   Hypothyroidism- Continue Synthroid 25 mcg a day. TSH needs to be rechecked the week of the 30th   Chronic back pain Continue gabapentin and Zoloft, as needed analgesia  Hyperglycemia secondary to type II diabetes mellitus: Continue sliding scale insulin Accu-Cheks diabetic diet.  DVT prophylaxis: heparin injection 5,000 Units Start: 09/14/19 1400   Code Status: Full code  Family Communication: None  Status is: Inpatient  Remains inpatient appropriate because:IV treatments appropriate due to intensity of illness or inability to take PO and Inpatient level of care appropriate due to severity of illness   Dispo: The patient is from: Home              Anticipated d/c is to: Home with home health.  Seen by physical therapy              Anticipated d/c date is: 2 days              Patient currently is not medically stable  to d/c.   Consultants:  Vascular surgery  PCCM  Cardiology  Procedures:  8/20 Endovascular repair of thoracic aortic ulcer/aneurysm without coverage of left subclavian 8/22 Lumbar drain placed for LE paralysis 8/23 right radial aline 8/23 right IJ CVL>>>  Antibiotics:  . None  Subjective: Today, patient was seen and examined at bedside.  Patient denies any nausea, vomiting or abdominal pain.  Denies any shortness of  breath, cough or fever.  Has had bowel movements.  Objective: Vitals:   09/16/19 2255 09/17/19 0348  BP: (!) 111/51 (!) 151/62  Pulse: 84 81  Resp: 20 18  Temp: 99.3 F (37.4 C) 99.7 F (37.6 C)  SpO2: 98% 94%    Intake/Output Summary (Last 24 hours) at 09/17/2019 0744 Last data filed at 09/17/2019 0300 Gross per 24 hour  Intake 206.37 ml  Output 655 ml  Net -448.63 ml   Filed Weights   09/15/19 0500 09/16/19 0500 09/17/19 0348  Weight: 66.3 kg 67 kg 68 kg   Body mass index is 29.28 kg/m.   Physical Exam: GENERAL: Patient is alert awake and oriented. Not in obvious distress. HENT: No scleral pallor or icterus. Pupils equally reactive to light. Oral mucosa is moist NECK: is supple, no gross swelling noted. CHEST: Clear to auscultation.  Diminished breath sounds bilaterally.  No crackles or wheezes noted. CVS: S1 and S2 heard, no murmur. Regular rate and rhythm.  ABDOMEN: Soft, non-tender, bowel sounds are present. EXTREMITIES: No edema.  Peripheral pulses are palpable CNS: Cranial nerves are intact. No focal motor deficits. SKIN: warm and dry without rashes.  Data Review: I have personally reviewed the following laboratory data and studies,  CBC: Recent Labs  Lab 09/13/19 0346 09/13/19 0346 09/13/19 1548 09/14/19 0500 09/15/19 0256 09/16/19 0355 09/17/19 0420  WBC 13.3*  --   --  16.5* 16.6* 10.6* 11.6*  HGB 6.7*   < > 9.4* 9.6* 10.4* 8.9* 9.4*  HCT 21.3*   < > 30.4* 29.5* 33.2* 29.2* 29.5*  MCV 79.8*  --   --  82.4 83.6 83.4 81.7  PLT 359  --   --  445* 501* 453* 509*   < > = values in this interval not displayed.   Basic Metabolic Panel: Recent Labs  Lab 09/13/19 2208 09/14/19 0500 09/15/19 0256 09/16/19 0355 09/17/19 0420  NA 136 136 138 137 135  K 4.3 3.7 3.8 4.1 4.0  CL 100 102 100 100 98  CO2 25 25 28 26 26   GLUCOSE 132* 121* 109* 103* 132*  BUN 20 16 13  26* 32*  CREATININE 1.05* 0.99 1.08* 2.57* 2.24*  CALCIUM 9.3 9.2 9.0 8.9 9.1  MG  --    --  1.7  --   --    Liver Function Tests: No results for input(s): AST, ALT, ALKPHOS, BILITOT, PROT, ALBUMIN in the last 168 hours. No results for input(s): LIPASE, AMYLASE in the last 168 hours. No results for input(s): AMMONIA in the last 168 hours. Cardiac Enzymes: No results for input(s): CKTOTAL, CKMB, CKMBINDEX, TROPONINI in the last 168 hours. BNP (last 3 results) Recent Labs    09/15/19 1223  BNP 527.8*    ProBNP (last 3 results) No results for input(s): PROBNP in the last 8760 hours.  CBG: Recent Labs  Lab 09/16/19 0631 09/16/19 1138 09/16/19 1621 09/16/19 2136 09/17/19 0614  GLUCAP 152* 126* 100* 173* 119*   No results found for this or any previous visit (from the past 240 hour(s)).   Studies:  DG Chest Port 1 View  Result Date: 09/15/2019 CLINICAL DATA:  Hypoxia EXAM: PORTABLE CHEST 1 VIEW COMPARISON:  September 13, 2019 FINDINGS: The cardiomediastinal silhouette is unchanged in contour.Status post endovascular repair of the descending thoracic aorta. RIGHT-sided cardiac pacing device. RIGHT IJ CVC tip terminates over the RIGHT atrium. Trace fluid in the RIGHT fissure. No pneumothorax. Improved aeration bilaterally with improved pulmonary edema and decreased atelectasis. Visualized abdomen is unremarkable. Multilevel degenerative changes of the thoracic spine. IMPRESSION: Improved aeration bilaterally with improved pulmonary edema and decreased atelectasis. Electronically Signed   By: Valentino Saxon MD   On: 09/15/2019 10:46   VAS US RENAL ARTERY DUPLEX  Result Date: 09/16/2019 ABDOMINAL VISCERAL Indications: Aorta dissection. Status post TEVAR with iliac conduit 09/09/19.              History of known renal artery stenosis. High Risk Factors: Hypertension, Diabetes. Performing Technologist: Oda Cogan RDMS, RVT  Examination Guidelines: A complete evaluation includes B-mode imaging, spectral Doppler, color Doppler, and power Doppler as needed of all accessible  portions of each vessel. Bilateral testing is considered an integral part of a complete examination. Limited examinations for reoccurring indications may be performed as noted.  Duplex Findings: +--------------------+--------+--------+------+--------+ Mesenteric          PSV cm/sEDV cm/sPlaqueComments +--------------------+--------+--------+------+--------+ Aorta Prox             88                          +--------------------+--------+--------+------+--------+ Celiac Artery Origin  256                          +--------------------+--------+--------+------+--------+ SMA Proximal          362                          +--------------------+--------+--------+------+--------+    +------------------+--------+--------+-------+ Right Renal ArteryPSV cm/sEDV cm/sComment +------------------+--------+--------+-------+ Origin              139      13           +------------------+--------+--------+-------+ Proximal            172      16           +------------------+--------+--------+-------+ Mid                 143      11           +------------------+--------+--------+-------+ Distal               82      0            +------------------+--------+--------+-------+ +-----------------+--------+--------+-------+ Left Renal ArteryPSV cm/sEDV cm/sComment +-----------------+--------+--------+-------+ Proximal           258      23           +-----------------+--------+--------+-------+ Mid                195      22           +-----------------+--------+--------+-------+ Distal             157      27           +-----------------+--------+--------+-------+  Technologist observations: Patent renal artery with high resistance waveform noted throughout the renal artery bilaterally. +------------+--------+--------+----+-----------+--------+--------+----+ Right KidneyPSV cm/sEDV cm/sRI  Left KidneyPSV  cm/sEDV cm/sRI    +------------+--------+--------+----+-----------+--------+--------+----+ Upper Pole  33      0       1.00Upper Pole 19      8       0.55 +------------+--------+--------+----+-----------+--------+--------+----+ Mid         45      0       1.00Mid        29      7       0.76 +------------+--------+--------+----+-----------+--------+--------+----+ Lower Pole  34      0       1.00Lower Pole 29      6       0.78 +------------+--------+--------+----+-----------+--------+--------+----+ Hilar       48      0       1.00Hilar      54      12      0.79 +------------+--------+--------+----+-----------+--------+--------+----+ +------------------+-----+------------------+-----+ Right Kidney           Left Kidney             +------------------+-----+------------------+-----+ RAR                    RAR                     +------------------+-----+------------------+-----+ RAR (manual)      1.9  RAR (manual)      2.9   +------------------+-----+------------------+-----+ Cortex                 Cortex                  +------------------+-----+------------------+-----+ Cortex thickness       Corex thickness         +------------------+-----+------------------+-----+ Kidney length (cm)11.38Kidney length (cm)11.26 +------------------+-----+------------------+-----+  Summary: Renal:  Right: 1-59% stenosis of the right renal artery. Abnormal right        Resistive Index. Normal size right kidney. Left:  1-59% stenosis of the left renal artery. Abnormal size for        the left kidney. Abnormal left Resisitve Index. Normal size        of left kidney.  *See table(s) above for measurements and observations.  Diagnosing physician: Servando Snare MD  Electronically signed by Servando Snare MD on 09/16/2019 at 4:56:31 PM.    Final       Flora Lipps, MD  [facility] 09/17/2019

## 2019-09-17 NOTE — Progress Notes (Signed)
  Progress Note    09/17/2019 11:45 AM 8 Days Post-Op  Subjective: she does not have any current complaints  Vitals:   09/17/19 0348 09/17/19 0852  BP: (!) 151/62 128/63  Pulse: 81 86  Resp: 18 18  Temp: 99.7 F (37.6 C) 99.3 F (37.4 C)  SpO2: 94% 95%    Physical Exam: aaox3 Abdomen is soft Bilateral radial and AT pulses are palpable   CBC    Component Value Date/Time   WBC 11.6 (H) 09/17/2019 0420   RBC 3.61 (L) 09/17/2019 0420   HGB 9.4 (L) 09/17/2019 0420   HCT 29.5 (L) 09/17/2019 0420   PLT 509 (H) 09/17/2019 0420   MCV 81.7 09/17/2019 0420   MCH 26.0 09/17/2019 0420   MCHC 31.9 09/17/2019 0420   RDW 22.1 (H) 09/17/2019 0420   LYMPHSABS 1.3 05/06/2017 0353   MONOABS 0.3 05/06/2017 0353   EOSABS 0.0 05/06/2017 0353   BASOSABS 0.0 05/06/2017 0353    BMET    Component Value Date/Time   NA 135 09/17/2019 0420   K 4.0 09/17/2019 0420   CL 98 09/17/2019 0420   CO2 26 09/17/2019 0420   GLUCOSE 132 (H) 09/17/2019 0420   BUN 32 (H) 09/17/2019 0420   CREATININE 2.24 (H) 09/17/2019 0420   CALCIUM 9.1 09/17/2019 0420   GFRNONAA 22 (L) 09/17/2019 0420   GFRAA 25 (L) 09/17/2019 0420    INR    Component Value Date/Time   INR 1.2 09/09/2019 1450     Intake/Output Summary (Last 24 hours) at 09/17/2019 1145 Last data filed at 09/17/2019 0300 Gross per 24 hour  Intake 206.37 ml  Output 655 ml  Net -448.63 ml     Assessment:  69 y.o. female is here with type imh, aki, htn Plan: Cr slightly improved-duplex did not demonstrate any occlusive disease suggestive of imh extension On asa and now crestor Appreciate hospitalists and cardiology care for this patient  Erlene Quan C. Donzetta Matters, MD [facility] Office: [phone] Pager: [phone]  09/17/2019 11:45 AM

## 2019-09-17 NOTE — Progress Notes (Signed)
Pt ambulated in hallway with front wheeled walker approximately 400 feet. Pt tolerated ambulation well.  Will continue to monitor.

## 2019-09-18 LAB — COMPREHENSIVE METABOLIC PANEL
ALT: 10 U/L (ref 0–44)
AST: 17 U/L (ref 15–41)
Albumin: 2.4 g/dL — ABNORMAL LOW (ref 3.5–5.0)
Alkaline Phosphatase: 59 U/L (ref 38–126)
Anion gap: 8 (ref 5–15)
BUN: 29 mg/dL — ABNORMAL HIGH (ref 8–23)
CO2: 27 mmol/L (ref 22–32)
Calcium: 8.8 mg/dL — ABNORMAL LOW (ref 8.9–10.3)
Chloride: 99 mmol/L (ref 98–111)
Creatinine, Ser: 1.7 mg/dL — ABNORMAL HIGH (ref 0.44–1.00)
GFR calc Af Amer: 35 mL/min — ABNORMAL LOW (ref >60)
GFR calc non Af Amer: 30 mL/min — ABNORMAL LOW (ref >60)
Glucose, Bld: 190 mg/dL — ABNORMAL HIGH (ref 70–99)
Potassium: 3.8 mmol/L (ref 3.5–5.1)
Sodium: 134 mmol/L — ABNORMAL LOW (ref 135–145)
Total Bilirubin: 0.5 mg/dL (ref 0.3–1.2)
Total Protein: 6.3 g/dL — ABNORMAL LOW (ref 6.5–8.1)

## 2019-09-18 LAB — GLUCOSE, CAPILLARY
Glucose-Capillary: 105 mg/dL — ABNORMAL HIGH (ref 70–99)
Glucose-Capillary: 81 mg/dL (ref 70–99)
Glucose-Capillary: 135 mg/dL — ABNORMAL HIGH (ref 70–99)
Glucose-Capillary: 168 mg/dL — ABNORMAL HIGH (ref 70–99)
Glucose-Capillary: 175 mg/dL — ABNORMAL HIGH (ref 70–99)

## 2019-09-18 LAB — MAGNESIUM: Magnesium: 2 mg/dL (ref 1.7–2.4)

## 2019-09-18 LAB — CBC
HCT: 28.8 % — ABNORMAL LOW (ref 36.0–46.0)
Hemoglobin: 8.9 g/dL — ABNORMAL LOW (ref 12.0–15.0)
MCH: 25.2 pg — ABNORMAL LOW (ref 26.0–34.0)
MCHC: 30.9 g/dL (ref 30.0–36.0)
MCV: 81.6 fL (ref 80.0–100.0)
Platelets: 483 10*3/uL — ABNORMAL HIGH (ref 150–400)
RBC: 3.53 MIL/uL — ABNORMAL LOW (ref 3.87–5.11)
RDW: 22.4 % — ABNORMAL HIGH (ref 11.5–15.5)
WBC: 11.5 10*3/uL — ABNORMAL HIGH (ref 4.0–10.5)
nRBC: 0 % (ref 0.0–0.2)

## 2019-09-18 NOTE — Progress Notes (Signed)
Patient central line removed as ordered and per protocol, tip intact. Pressure held and dressing applied. Patient reminded to lie supine for 30 minutes. Will monitor patient. Dhwani Venkatesh, Bettina Gavia RN

## 2019-09-18 NOTE — Progress Notes (Signed)
Occupational Therapy Treatment Patient Details Name: [name] MRN: [id number] DOB: 1950-07-19 Today's Date: 09/18/2019    History of present illness 69 yo admitted to [facility] on 8/8 with abdominal and back pain found to have thoracic aortic dissection with intramural hematoma and HTN transferred to [facility] s/p TEVAR with spinal cord ischemia with LE paralysis with was treated with EVD with improvement. PMHx: HTN, DM, gout, asthma, Afib   OT comments  Pt. Seen for skilled OT treatment session.  Pt. Able to complete bed mobility, standing grooming tasks, and simulated toileting at S.  Reports assistance available from son and dtr. In law along with several neighbors.   Follow Up Recommendations  Home health OT    Equipment Recommendations  3 in 1 bedside commode    Recommendations for Other Services      Precautions / Restrictions Precautions Precautions: Fall Precaution Comments: watch sats       Mobility Bed Mobility Overal bed mobility: Needs Assistance Bed Mobility: Supine to Sit     Supine to sit: Supervision        Transfers     Transfers: Sit to/from Stand;Stand Pivot Transfers Sit to Stand: Supervision Stand pivot transfers: Supervision            Balance                                           ADL either performed or assessed with clinical judgement   ADL Overall ADL's : Needs assistance/impaired     Grooming: Set up;Supervision/safety;Standing;Oral care;Wash/dry face                   Toilet Transfer: Consulting civil engineer Details (indicate cue type and reason): simulated during in room ambulation eob to sink, then to recliner (declined need for actual use)         Functional mobility during ADLs: Supervision/safety General ADL Comments: pt. able to complete standing grooming tasks and in room ambulation at S level of assistance.  reports her son and dtr. in law are in town and assisting with  making modifications and arrangement to decrease burden and increase safety with taking care of herself and her husband who has dementia     Vision       Perception     Praxis      Cognition Arousal/Alertness: Awake/alert Behavior During Therapy: WFL for tasks assessed/performed Overall Cognitive Status: Within Functional Limits for tasks assessed                                          Exercises     Shoulder Instructions       General Comments  pt. Reports she realizes she hasnt been caring for herself as her husband requires a lot of care (dementia/alzheimers).  States she didn't realize "how bad he was" until she was yelling for him to call 911 and he didn't know how to and didn't know how to use the phone. States her son and neighbors are working together to get his care organized to assist her also.     Pertinent Vitals/ Pain       Pain Assessment: No/denies pain  Home Living  Prior Functioning/Environment              Frequency  Min 2X/week        Progress Toward Goals  OT Goals(current goals can now be found in the care plan section)  Progress towards OT goals: Progressing toward goals     Plan      Co-evaluation                 AM-PAC OT "6 Clicks" Daily Activity     Outcome Measure   Help from another person eating meals?: None Help from another person taking care of personal grooming?: A Little Help from another person toileting, which includes using toliet, bedpan, or urinal?: A Little Help from another person bathing (including washing, rinsing, drying)?: A Little Help from another person to put on and taking off regular upper body clothing?: A Little Help from another person to put on and taking off regular lower body clothing?: A Little 6 Click Score: 19    End of Session Equipment Utilized During Treatment: Gait belt  OT Visit Diagnosis: Unsteadiness  on feet (R26.81);Other abnormalities of gait and mobility (R26.89)   Activity Tolerance Patient tolerated treatment well   Patient Left in chair;with call bell/phone within reach   Nurse Communication          Time: 4982-6415 OT Time Calculation (min): 14 min  Charges: OT General Charges $OT Visit: 1 Visit OT Treatments $Self Care/Home Management : 8-22 mins  Sonia Baller, COTA/L Acute Rehabilitation [phone]   [name] 09/18/2019, 10:29 AM

## 2019-09-18 NOTE — Progress Notes (Signed)
Cardiology Progress Note  Patient ID: [name] MRN: [id number] DOB: 10-03-1950 Date of Encounter: 09/18/2019  Primary Cardiologist: No primary care provider on file.  Subjective   Chief Complaint: Blood pressure better.  No complaints.  HPI: Without complaints.  Discharge likely tomorrow.  ROS:  All other ROS reviewed and negative. Pertinent positives noted in the HPI.     Inpatient Medications  Scheduled Meds: . carvedilol  12.5 mg Oral BID WC  . Chlorhexidine Gluconate Cloth  6 each Topical Daily  . cholecalciferol  1,000 Units Oral q morning - 10a  . cloNIDine  0.1 mg Oral BID  . docusate sodium  100 mg Oral Daily  . famotidine  20 mg Oral Daily  . gabapentin  100 mg Oral TID  . heparin injection (subcutaneous)  5,000 Units Subcutaneous Q8H  . insulin aspart  0-15 Units Subcutaneous TID WC  . insulin aspart  0-5 Units Subcutaneous QHS  . levothyroxine  25 mcg Oral Q0600  . melatonin  9 mg Oral QHS  . nicotine  7 mg Transdermal Daily  . pantoprazole  40 mg Oral Daily  . polyethylene glycol  17 g Oral BID  . rosuvastatin  20 mg Oral Daily  . sertraline  50 mg Oral QHS   Continuous Infusions: . sodium chloride Stopped (08/30/19 0233)  . sodium chloride 100 mL/hr at 09/16/19 1300  . magnesium sulfate bolus IVPB     PRN Meds: sodium chloride, acetaminophen **OR** acetaminophen, alum & mag hydroxide-simeth, diphenhydrAMINE, docusate sodium, guaiFENesin-dextromethorphan, HYDROmorphone (DILAUDID) injection, lidocaine, magnesium sulfate bolus IVPB, ondansetron, ondansetron, oxyCODONE, phenol, pneumococcal 23 valent vaccine   Vital Signs   Vitals:   09/18/19 0405 09/18/19 0629 09/18/19 0903 09/18/19 0904  BP: 136/63  (!) 153/80   Pulse: 84     Resp: 18 12 (!) 21   Temp: 98.7 F (37.1 C)   99.4 F (37.4 C)  TempSrc: Oral   Oral  SpO2: 95%  96%   Weight:  68.8 kg    Height:        Intake/Output Summary (Last 24 hours) at 09/18/2019 0907 Last data filed  at 09/18/2019 0904 Gross per 24 hour  Intake 480 ml  Output --  Net 480 ml   Last 3 Weights 09/18/2019 09/17/2019 09/16/2019  Weight (lbs) 151 lb 9.6 oz 149 lb 14.4 oz 147 lb 11.3 oz  Weight (kg) 68.765 kg 67.994 kg 67 kg      Telemetry  Overnight telemetry shows sinus rhythm in the 90s, which I personally reviewed.   ECG  The most recent ECG shows normal sinus rhythm, which I personally reviewed.   Physical Exam   Vitals:   09/18/19 0405 09/18/19 0629 09/18/19 0903 09/18/19 0904  BP: 136/63  (!) 153/80   Pulse: 84     Resp: 18 12 (!) 21   Temp: 98.7 F (37.1 C)   99.4 F (37.4 C)  TempSrc: Oral   Oral  SpO2: 95%  96%   Weight:  68.8 kg    Height:         Intake/Output Summary (Last 24 hours) at 09/18/2019 0907 Last data filed at 09/18/2019 0904 Gross per 24 hour  Intake 480 ml  Output --  Net 480 ml    Last 3 Weights 09/18/2019 09/17/2019 09/16/2019  Weight (lbs) 151 lb 9.6 oz 149 lb 14.4 oz 147 lb 11.3 oz  Weight (kg) 68.765 kg 67.994 kg 67 kg    Body mass index is 29.61  kg/m.  General: Well nourished, well developed, in no acute distress Head: Atraumatic, normal size  Eyes: PEERLA, EOMI  Neck: Supple, no JVD Endocrine: No thryomegaly Cardiac: Normal S1, S2; 2 out of 6 systolic ejection murmur Lungs: Clear to auscultation bilaterally, no wheezing, rhonchi or rales  Abd: Soft, nontender, no hepatomegaly  Ext: No edema, pulses 2+ Musculoskeletal: No deformities, BUE and BLE strength normal and equal Skin: Warm and dry, no rashes   Neuro: Alert and oriented to person, place, time, and situation, CNII-XII grossly intact, no focal deficits  Psych: Normal mood and affect   Labs  High Sensitivity Troponin:   Recent Labs  Lab 08/28/19 1011 08/28/19 1212  TROPONINIHS 10 9     Cardiac EnzymesNo results for input(s): TROPONINI in the last 168 hours. No results for input(s): TROPIPOC in the last 168 hours.  Chemistry Recent Labs  Lab 09/16/19 0355 09/17/19 0420  09/18/19 0310  NA 137 135 134*  K 4.1 4.0 3.8  CL 100 98 99  CO2 26 26 27   GLUCOSE 103* 132* 190*  BUN 26* 32* 29*  CREATININE 2.57* 2.24* 1.70*  CALCIUM 8.9 9.1 8.8*  PROT  --   --  6.3*  ALBUMIN  --   --  2.4*  AST  --   --  17  ALT  --   --  10  ALKPHOS  --   --  59  BILITOT  --   --  0.5  GFRNONAA 18* 22* 30*  GFRAA 21* 25* 35*  ANIONGAP 11 11 8     Hematology Recent Labs  Lab 09/16/19 0355 09/17/19 0420 09/18/19 0310  WBC 10.6* 11.6* 11.5*  RBC 3.50* 3.61* 3.53*  HGB 8.9* 9.4* 8.9*  HCT 29.2* 29.5* 28.8*  MCV 83.4 81.7 81.6  MCH 25.4* 26.0 25.2*  MCHC 30.5 31.9 30.9  RDW 22.2* 22.1* 22.4*  PLT 453* 509* 483*   BNP Recent Labs  Lab 09/15/19 1223  BNP 527.8*    DDimer No results for input(s): DDIMER in the last 168 hours.   Radiology  VAS US RENAL ARTERY DUPLEX  Result Date: 09/16/2019 ABDOMINAL VISCERAL Indications: Aorta dissection. Status post TEVAR with iliac conduit 09/09/19.              History of known renal artery stenosis. High Risk Factors: Hypertension, Diabetes. Performing Technologist: Oda Cogan RDMS, RVT  Examination Guidelines: A complete evaluation includes B-mode imaging, spectral Doppler, color Doppler, and power Doppler as needed of all accessible portions of each vessel. Bilateral testing is considered an integral part of a complete examination. Limited examinations for reoccurring indications may be performed as noted.  Duplex Findings: +--------------------+--------+--------+------+--------+ Mesenteric          PSV cm/sEDV cm/sPlaqueComments +--------------------+--------+--------+------+--------+ Aorta Prox             88                          +--------------------+--------+--------+------+--------+ Celiac Artery Origin  256                          +--------------------+--------+--------+------+--------+ SMA Proximal          362                          +--------------------+--------+--------+------+--------+     +------------------+--------+--------+-------+ Right Renal ArteryPSV cm/sEDV cm/sComment +------------------+--------+--------+-------+ Origin  139      13           +------------------+--------+--------+-------+ Proximal            172      16           +------------------+--------+--------+-------+ Mid                 143      11           +------------------+--------+--------+-------+ Distal               82      0            +------------------+--------+--------+-------+ +-----------------+--------+--------+-------+ Left Renal ArteryPSV cm/sEDV cm/sComment +-----------------+--------+--------+-------+ Proximal           258      23           +-----------------+--------+--------+-------+ Mid                195      22           +-----------------+--------+--------+-------+ Distal             157      27           +-----------------+--------+--------+-------+  Technologist observations: Patent renal artery with high resistance waveform noted throughout the renal artery bilaterally. +------------+--------+--------+----+-----------+--------+--------+----+ Right KidneyPSV cm/sEDV cm/sRI  Left KidneyPSV cm/sEDV cm/sRI   +------------+--------+--------+----+-----------+--------+--------+----+ Upper Pole  33      0       1.00Upper Pole 19      8       0.55 +------------+--------+--------+----+-----------+--------+--------+----+ Mid         45      0       1.00Mid        29      7       0.76 +------------+--------+--------+----+-----------+--------+--------+----+ Lower Pole  34      0       1.00Lower Pole 29      6       0.78 +------------+--------+--------+----+-----------+--------+--------+----+ Hilar       48      0       1.00Hilar      54      12      0.79 +------------+--------+--------+----+-----------+--------+--------+----+ +------------------+-----+------------------+-----+ Right Kidney           Left Kidney              +------------------+-----+------------------+-----+ RAR                    RAR                     +------------------+-----+------------------+-----+ RAR (manual)      1.9  RAR (manual)      2.9   +------------------+-----+------------------+-----+ Cortex                 Cortex                  +------------------+-----+------------------+-----+ Cortex thickness       Corex thickness         +------------------+-----+------------------+-----+ Kidney length (cm)11.38Kidney length (cm)11.26 +------------------+-----+------------------+-----+  Summary: Renal:  Right: 1-59% stenosis of the right renal artery. Abnormal right        Resistive Index. Normal size right kidney. Left:  1-59% stenosis of the left renal artery. Abnormal size for        the left kidney. Abnormal  left Resisitve Index. Normal size        of left kidney.  *See table(s) above for measurements and observations.  Diagnosing physician: Servando Snare MD  Electronically signed by Servando Snare MD on 09/16/2019 at 4:56:31 PM.    Final     Cardiac Studies  TTE 08/29/2019  1. Left ventricular ejection fraction, by estimation, is 65 to 70%. The  left ventricle has normal function. The left ventricle has no regional  wall motion abnormalities. There is mild concentric left ventricular  hypertrophy. Left ventricular diastolic  parameters are consistent with Grade I diastolic dysfunction (impaired  relaxation).  2. Right ventricular systolic function is normal. The right ventricular  size is normal. There is moderately elevated pulmonary artery systolic  pressure. The estimated right ventricular systolic pressure is 79.8 mmHg.  3. Left atrial size was mild to moderately dilated.  4. Right atrial size was mild to moderately dilated.  5. The mitral valve is degenerative. Trivial mitral valve regurgitation.  No evidence of mitral stenosis.  6. The aortic valve is tricuspid. Aortic valve regurgitation is  not  visualized. Mild aortic valve stenosis. Aortic valve area, by VTI measures  1.97 cm. Aortic valve mean gradient measures 12.5 mmHg. Aortic valve Vmax  measures 2.35 m/s.  7. The inferior vena cava is dilated in size with >50% respiratory  variability, suggesting right atrial pressure of 8 mmHg.   Patient Profile  [name] is a 69 y.o. female with hypertension, paroxysmal atrial fibrillation, PVD, diabetes who was admitted on 09/07/2019 for descending thoracic aortic intramural hematoma/dissection.  Underwent TEVAR on 09/09/2019.  Cardiology consulted 09/15/2019 for management of accelerated hypertension.  Assessment & Plan   1.  Hypertension -BP controlled on Coreg 12.5 milligrams twice daily and clonidine 0.1 mg twice daily.  She has several medication allergies. -Renal duplex without any significant renal artery stenosis. -Creatinine coming down. -We will continue current regimen.  She can have this followed as an outpatient.  2.  Mild aortic stenosis -Follow as outpatient  3.  Descending thoracic aortic intramural hematoma/dissection -Status post TEVAR.  Continue aspirin and Crestor.  4.  Sick sinus syndrome status post pacemaker/paroxysmal atrial fibrillation -Brief A. fib in 2016.  No recurrence.  No anticoagulation. -Pacemaker is functioning normally.  [facility] will sign off.   Medication Recommendations: Coreg 12.5 twice daily, clonidine 0.1 twice daily, aspirin/statin. Other recommendations (labs, testing, etc): None Follow up as an outpatient: She may keep her regular follow-up with Dr. Crissie Sickles as an outpatient  For questions or updates, please contact [facility] Please consult [email or web address] for contact info under   Time Spent with Patient: I have spent a total of 15 minutes with patient reviewing hospital notes, telemetry, EKGs, labs and examining the patient as well as establishing an assessment and plan that was discussed with the patient.   > 50% of time was spent in direct patient care.    Signed, Addison Naegeli. Audie Box, [place]  09/18/2019 9:07 AM

## 2019-09-18 NOTE — Progress Notes (Signed)
Patient ambulated in hallway with nursing staff and rolling walker. approximately 500 feet. Tolerated well. Assisted back to bed for central line removal. Inez Stantz, Bettina Gavia RN

## 2019-09-18 NOTE — Progress Notes (Signed)
PROGRESS NOTE  [name] TKW:[id number] DOB: 21-Feb-1950 DOA: 08/28/2019 PCP: Antonietta Jewel, MD   LOS: 21 days   Brief narrative: As per HPI,  69 year old female with PMHx of hypertension, tobacco use disorder, atrial fibrillation, hypothyroidism presented to [facility] with abdominal pain for 2 days. She was found to have a descending thoracic aortic aneurysm and was transferred to [facility] for further management.  Patient did have difficult to control hypertension and was taken to the OR on 8/20 endovascular repair.  Patient subsequently was hypotensive and was put on norepinephrine drip.  Patient also received 1 unit of packed RBC.  Subsequently, norepinephrine was weaned off and was put back on oral antihypertensives.  Patient was then considered stable for transfer out of the ICU.  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  Vascular surgery and Cardiology on board . On  aspirin, and Crestor.  Will discontinue central line.  Resistant hypertension Currently on Coreg, clonidine.  Angioedema with lisinopril and amlodipine.  Cardiology recommends Coreg 12.5 mg twice daily clonidine 0.1 mg twice a day aspirin and statins on discharge.  Patient will have to follow-up with cardiology Dr. Lovena Le as outpatient.  History of angioedema with lisinopril and amlodipine.  Will avoid on discharge.  Spinal cord ischemia post TEVAR.  improved with lumbar drain. Avoid hypotension. Systolic blood pressure goal around 140  Acute hypoxic respiratory failure  Improved at this time.  Chest x-ray showed atelectasis.  On room air at this time.  Continue incentive spirometry.    Acute  kidney injury Likely multifactorial from overdiuresis, hypertension.  Creatinine of 1.7 from 2.2.  Will closely need to monitor BMP  Leukocytosis with low-grade fever.  Has been improving since Foley catheter is out.  Trend WBC.  Temperature max of 99.8 F.  WBC at 11.5.  Anemia, no active bleeding.  Likely postsurgical.  Received 1 unit of packed RBC.  Hemoglobin of 8.9 at this time.   Hypothyroidism- Continue Synthroid 25 mcg a day. TSH needs to be rechecked as outpatient.  Chronic back pain Continue gabapentin and Zoloft, as needed analgesia  Hyperglycemia secondary to type II diabetes mellitus: Continue sliding scale insulin, Accu-Cheks diabetic diet.  Last POC glucose of 81  DVT prophylaxis: heparin injection 5,000 Units Start: 09/14/19 1400   Code Status: Full code  Family Communication: None  Status is: Inpatient  Remains inpatient appropriate because:IV treatments appropriate due to intensity of illness or inability to take PO and Inpatient level of care appropriate due to severity of illness   Dispo: The patient is from: Home              Anticipated d/c is to: Home with home health.  Seen by physical therapy              Anticipated d/c date is: 1 to 2 days              Patient currently is not medically stable to d/c.   Consultants:  Vascular surgery  PCCM  Cardiology  Procedures:  8/20 Endovascular repair of thoracic aortic ulcer/aneurysm without coverage of left subclavian 8/22 Lumbar drain placed for LE paralysis 8/23 right  radial aline 8/23 right IJ CVL>>>  Antibiotics:  . None  Subjective: Today, patient was seen and examined at bedside.  Patient denies any nausea, vomiting, fever, chills or rigor.  Feels better.  Has ambulated some.   Objective: Vitals:   09/18/19 0903 09/18/19 0904  BP: (!) 153/80   Pulse:    Resp: (!) 21   Temp:  99.4 F (37.4 C)  SpO2: 96%     Intake/Output Summary (Last 24 hours) at  09/18/2019 0924 Last data filed at 09/18/2019 0904 Gross per 24 hour  Intake 480 ml  Output --  Net 480 ml   Filed Weights   09/16/19 0500 09/17/19 0348 09/18/19 0629  Weight: 67 kg 68 kg 68.8 kg   Body mass index is 29.61 kg/m.   Physical Exam: GENERAL: Patient is alert awake and oriented. Not in obvious distress. HENT: No scleral pallor or icterus. Pupils equally reactive to light. Oral mucosa is moist.  Right internal jugular central line in place NECK: is supple, no gross swelling noted. CHEST: Clear to auscultation.  Diminished breath sounds bilaterally.  No crackles or wheezes noted. CVS: S1 and S2 heard, no murmur. Regular rate and rhythm.  ABDOMEN: Soft, non-tender, bowel sounds are present. EXTREMITIES: No edema.  Peripheral pulses are palpable CNS: Cranial nerves are intact. No focal motor deficits. SKIN: warm and dry without rashes.  Data Review: I have personally reviewed the following laboratory data and studies,  CBC: Recent Labs  Lab 09/14/19 0500 09/15/19 0256 09/16/19 0355 09/17/19 0420 09/18/19 0310  WBC 16.5* 16.6* 10.6* 11.6* 11.5*  HGB 9.6* 10.4* 8.9* 9.4* 8.9*  HCT 29.5* 33.2* 29.2* 29.5* 28.8*  MCV 82.4 83.6 83.4 81.7 81.6  PLT 445* 501* 453* 509* 500*   Basic Metabolic Panel: Recent Labs  Lab 09/14/19 0500 09/15/19 0256 09/16/19 0355 09/17/19 0420 09/18/19 0310  NA 136 138 137 135 134*  K 3.7 3.8 4.1 4.0 3.8  CL 102 100 100 98 99  CO2 25 28 26 26 27   GLUCOSE 121* 109* 103* 132* 190*  BUN 16 13 26* 32* 29*  CREATININE 0.99 1.08* 2.57* 2.24* 1.70*  CALCIUM 9.2 9.0 8.9 9.1 8.8*  MG  --  1.7  --   --  2.0   Liver Function Tests: Recent Labs  Lab 09/18/19 0310  AST 17  ALT 10  ALKPHOS 59  BILITOT 0.5  PROT 6.3*  ALBUMIN 2.4*   No results for input(s): LIPASE, AMYLASE in the last 168 hours. No results for input(s): AMMONIA in the last 168 hours. Cardiac Enzymes: No results for input(s): CKTOTAL, CKMB, CKMBINDEX, TROPONINI in the  last 168 hours. BNP (last 3 results) Recent Labs    09/15/19 1223  BNP 527.8*    ProBNP (last 3 results) No results for input(s): PROBNP in the last 8760 hours.  CBG: Recent Labs  Lab 09/17/19 1200 09/17/19 1612 09/17/19 2027 09/18/19 0629 09/18/19 0723  GLUCAP 118* 154* 109* 105* 81   No results found for this or any previous visit (from the past 240 hour(s)).   Studies: VAS US RENAL ARTERY DUPLEX  Result Date: 09/16/2019 ABDOMINAL VISCERAL Indications: Aorta dissection. Status post TEVAR with iliac conduit 09/09/19.              History of known renal artery stenosis. High Risk Factors: Hypertension, Diabetes. Performing Technologist: Oda Cogan RDMS, RVT  Examination Guidelines: A complete evaluation includes B-mode imaging, spectral Doppler, color Doppler, and power Doppler as needed of all  accessible portions of each vessel. Bilateral testing is considered an integral part of a complete examination. Limited examinations for reoccurring indications may be performed as noted.  Duplex Findings: +--------------------+--------+--------+------+--------+ Mesenteric          PSV cm/sEDV cm/sPlaqueComments +--------------------+--------+--------+------+--------+ Aorta Prox             88                          +--------------------+--------+--------+------+--------+ Celiac Artery Origin  256                          +--------------------+--------+--------+------+--------+ SMA Proximal          362                          +--------------------+--------+--------+------+--------+    +------------------+--------+--------+-------+ Right Renal ArteryPSV cm/sEDV cm/sComment +------------------+--------+--------+-------+ Origin              139      13           +------------------+--------+--------+-------+ Proximal            172      16           +------------------+--------+--------+-------+ Mid                 143      11            +------------------+--------+--------+-------+ Distal               82      0            +------------------+--------+--------+-------+ +-----------------+--------+--------+-------+ Left Renal ArteryPSV cm/sEDV cm/sComment +-----------------+--------+--------+-------+ Proximal           258      23           +-----------------+--------+--------+-------+ Mid                195      22           +-----------------+--------+--------+-------+ Distal             157      27           +-----------------+--------+--------+-------+  Technologist observations: Patent renal artery with high resistance waveform noted throughout the renal artery bilaterally. +------------+--------+--------+----+-----------+--------+--------+----+ Right KidneyPSV cm/sEDV cm/sRI  Left KidneyPSV cm/sEDV cm/sRI   +------------+--------+--------+----+-----------+--------+--------+----+ Upper Pole  33      0       1.00Upper Pole 19      8       0.55 +------------+--------+--------+----+-----------+--------+--------+----+ Mid         45      0       1.00Mid        29      7       0.76 +------------+--------+--------+----+-----------+--------+--------+----+ Lower Pole  34      0       1.00Lower Pole 29      6       0.78 +------------+--------+--------+----+-----------+--------+--------+----+ Hilar       48      0       1.00Hilar      54      12      0.79 +------------+--------+--------+----+-----------+--------+--------+----+ +------------------+-----+------------------+-----+ Right Kidney           Left Kidney             +------------------+-----+------------------+-----+  RAR                    RAR                     +------------------+-----+------------------+-----+ RAR (manual)      1.9  RAR (manual)      2.9   +------------------+-----+------------------+-----+ Cortex                 Cortex                   +------------------+-----+------------------+-----+ Cortex thickness       Corex thickness         +------------------+-----+------------------+-----+ Kidney length (cm)11.38Kidney length (cm)11.26 +------------------+-----+------------------+-----+  Summary: Renal:  Right: 1-59% stenosis of the right renal artery. Abnormal right        Resistive Index. Normal size right kidney. Left:  1-59% stenosis of the left renal artery. Abnormal size for        the left kidney. Abnormal left Resisitve Index. Normal size        of left kidney.  *See table(s) above for measurements and observations.  Diagnosing physician: Servando Snare MD  Electronically signed by Servando Snare MD on 09/16/2019 at 4:56:31 PM.    Final       Flora Lipps, MD  [facility] 09/18/2019

## 2019-09-18 NOTE — Progress Notes (Addendum)
  Progress Note    09/18/2019 8:02 AM 9 Days Post-Op  Subjective:  No complaints. Sitting up in bed and eating breakfast. States she did have some LLQ pain last night but resolved quickly. Has tolerated ambulating without difficulty. Tolerating diet. Voiding without difficulty and BM.   Vitals:   09/18/19 0405 09/18/19 0629  BP: 136/63   Pulse: 84   Resp: 18 12  Temp: 98.7 F (37.1 C)   SpO2: 95%    Physical Exam: Cardiac: regular rate and rhythm Lungs:  Non labored Incisions:  Left iliac and left groin incisions clean, dry and intact. Healing well. No swelling or hematoma Extremities: Extremities well perfused and warm. 2+ radial pulses. Palpable DP/ PT pulses bilaterally Abdomen: soft, non tender non distended Neurologic:alert and oriented  CBC    Component Value Date/Time   WBC 11.5 (H) 09/18/2019 0310   RBC 3.53 (L) 09/18/2019 0310   HGB 8.9 (L) 09/18/2019 0310   HCT 28.8 (L) 09/18/2019 0310   PLT 483 (H) 09/18/2019 0310   MCV 81.6 09/18/2019 0310   MCH 25.2 (L) 09/18/2019 0310   MCHC 30.9 09/18/2019 0310   RDW 22.4 (H) 09/18/2019 0310   LYMPHSABS 1.3 05/06/2017 0353   MONOABS 0.3 05/06/2017 0353   EOSABS 0.0 05/06/2017 0353   BASOSABS 0.0 05/06/2017 0353    BMET    Component Value Date/Time   NA 134 (L) 09/18/2019 0310   K 3.8 09/18/2019 0310   CL 99 09/18/2019 0310   CO2 27 09/18/2019 0310   GLUCOSE 190 (H) 09/18/2019 0310   BUN 29 (H) 09/18/2019 0310   CREATININE 1.70 (H) 09/18/2019 0310   CALCIUM 8.8 (L) 09/18/2019 0310   GFRNONAA 30 (L) 09/18/2019 0310   GFRAA 35 (L) 09/18/2019 0310    INR    Component Value Date/Time   INR 1.2 09/09/2019 1450     Intake/Output Summary (Last 24 hours) at 09/18/2019 0802 Last data filed at 09/17/2019 2200 Gross per 24 hour  Intake 240 ml  Output --  Net 240 ml     Assessment/Plan:  69 y.o. female is s/p TEVAR with iliac conduit 9 Days Post-Op. Extremities are well perfused and warm. Palpable radial and  distal pulses bilaterally. Now voiding spontaneously. UOP was not measured. SCr improved down to 1.7 today. Hemodynamically stable. On Asa and Crestor. Stable from vascular standpoint  DVT prophylaxis:  Sq heparin   Karoline Caldwell, PA-C [facility] [phone] 09/18/2019 8:02 AM   I have independently interviewed and examined patient and agree with PA assessment and plan above.   Vraj Denardo C. Donzetta Matters, MD [facility] [place] Office: [phone] Pager: [phone]

## 2019-09-19 DIAGNOSIS — Z452 Encounter for adjustment and management of vascular access device: Secondary | ICD-10-CM

## 2019-09-19 LAB — BASIC METABOLIC PANEL
Anion gap: 11 (ref 5–15)
BUN: 25 mg/dL — ABNORMAL HIGH (ref 8–23)
CO2: 25 mmol/L (ref 22–32)
Calcium: 8.6 mg/dL — ABNORMAL LOW (ref 8.9–10.3)
Chloride: 100 mmol/L (ref 98–111)
Creatinine, Ser: 1.32 mg/dL — ABNORMAL HIGH (ref 0.44–1.00)
GFR calc Af Amer: 48 mL/min — ABNORMAL LOW (ref >60)
GFR calc non Af Amer: 41 mL/min — ABNORMAL LOW (ref >60)
Glucose, Bld: 157 mg/dL — ABNORMAL HIGH (ref 70–99)
Potassium: 4.1 mmol/L (ref 3.5–5.1)
Sodium: 136 mmol/L (ref 135–145)

## 2019-09-19 LAB — CBC
HCT: 30.8 % — ABNORMAL LOW (ref 36.0–46.0)
Hemoglobin: 9.5 g/dL — ABNORMAL LOW (ref 12.0–15.0)
MCH: 25.1 pg — ABNORMAL LOW (ref 26.0–34.0)
MCHC: 30.8 g/dL (ref 30.0–36.0)
MCV: 81.3 fL (ref 80.0–100.0)
Platelets: 478 10*3/uL — ABNORMAL HIGH (ref 150–400)
RBC: 3.79 MIL/uL — ABNORMAL LOW (ref 3.87–5.11)
RDW: 22.2 % — ABNORMAL HIGH (ref 11.5–15.5)
WBC: 12.1 10*3/uL — ABNORMAL HIGH (ref 4.0–10.5)
nRBC: 0 % (ref 0.0–0.2)

## 2019-09-19 LAB — GLUCOSE, CAPILLARY
Glucose-Capillary: 108 mg/dL — ABNORMAL HIGH (ref 70–99)
Glucose-Capillary: 126 mg/dL — ABNORMAL HIGH (ref 70–99)

## 2019-09-19 MED ORDER — PANTOPRAZOLE SODIUM 40 MG PO TBEC: 40.0000 mg | DELAYED_RELEASE_TABLET | Freq: Every day | ORAL | 1 refills | Status: AC

## 2019-09-19 MED ORDER — NICOTINE 7 MG/24HR TD PT24: 7.0000 mg | MEDICATED_PATCH | Freq: Every day | TRANSDERMAL | 0 refills | Status: DC

## 2019-09-19 MED ORDER — LIDOCAINE 4 % EX CREA
TOPICAL_CREAM | Freq: Four times a day (QID) | CUTANEOUS | 0 refills | Status: DC | PRN
Start: 2019-09-19 — End: 2021-07-17

## 2019-09-19 MED ORDER — ROSUVASTATIN CALCIUM 20 MG PO TABS
20.0000 mg | ORAL_TABLET | Freq: Every day | ORAL | 2 refills | Status: DC
Start: 2019-09-20 — End: 2022-06-22

## 2019-09-19 MED ORDER — LOPERAMIDE HCL 2 MG PO CAPS
2.0000 mg | ORAL_CAPSULE | Freq: Once | ORAL | Status: AC
  Administered 2019-09-19: 2 mg via ORAL
  Filled 2019-09-19: qty 1

## 2019-09-19 MED ORDER — MELATONIN 3 MG PO TABS
9.0000 mg | ORAL_TABLET | Freq: Every day | ORAL | 0 refills | Status: DC
Start: 2019-09-19 — End: 2020-07-24

## 2019-09-19 MED ORDER — CLONIDINE HCL 0.1 MG PO TABS: 0.1000 mg | ORAL_TABLET | Freq: Two times a day (BID) | ORAL | 2 refills | Status: DC

## 2019-09-19 MED ORDER — LEVOTHYROXINE SODIUM 25 MCG PO TABS: 25.0000 ug | ORAL_TABLET | Freq: Every day | ORAL | 2 refills | Status: AC

## 2019-09-19 MED ORDER — CARVEDILOL 12.5 MG PO TABS
12.5000 mg | ORAL_TABLET | Freq: Two times a day (BID) | ORAL | 2 refills | Status: DC
Start: 2019-09-19 — End: 2019-10-11

## 2019-09-19 MED ORDER — GABAPENTIN 100 MG PO CAPS: 100.0000 mg | ORAL_CAPSULE | Freq: Three times a day (TID) | ORAL | 2 refills | Status: AC

## 2019-09-19 NOTE — Progress Notes (Addendum)
  Progress Note    09/19/2019 7:14 AM 10 Days Post-Op  Subjective:  Says she didn't get much sleep last night bc her husband got admitted to [facility] yesterday with worsening dementia.  She states she thinks it is worse bc he has not seen her.  They have been married for 32 years.  Otherwise, her only complaint is occasional twinge of pain in the LLQ.  She states she has been voiding and has had a BM.  Tm 100 now afebrile  Vitals:   09/18/19 2332 09/19/19 0347  BP: 135/62 137/61  Pulse: 85 78  Resp: 19 19  Temp: 99.6 F (37.6 C) 98.4 F (36.9 C)  SpO2: 98% 98%    Physical Exam: Cardiac:  regular Lungs:  Non labored Incisions:  Left iliac and left groin incisions look good.   Extremities:  Easily palpable bilateral radials and DP. Abdomen:  Soft, NT/ND; +BM  CBC    Component Value Date/Time   WBC 12.1 (H) 09/19/2019 0140   RBC 3.79 (L) 09/19/2019 0140   HGB 9.5 (L) 09/19/2019 0140   HCT 30.8 (L) 09/19/2019 0140   PLT 478 (H) 09/19/2019 0140   MCV 81.3 09/19/2019 0140   MCH 25.1 (L) 09/19/2019 0140   MCHC 30.8 09/19/2019 0140   RDW 22.2 (H) 09/19/2019 0140   LYMPHSABS 1.3 05/06/2017 0353   MONOABS 0.3 05/06/2017 0353   EOSABS 0.0 05/06/2017 0353   BASOSABS 0.0 05/06/2017 0353    BMET    Component Value Date/Time   NA 136 09/19/2019 0140   K 4.1 09/19/2019 0140   CL 100 09/19/2019 0140   CO2 25 09/19/2019 0140   GLUCOSE 157 (H) 09/19/2019 0140   BUN 25 (H) 09/19/2019 0140   CREATININE 1.32 (H) 09/19/2019 0140   CALCIUM 8.6 (L) 09/19/2019 0140   GFRNONAA 41 (L) 09/19/2019 0140   GFRAA 48 (L) 09/19/2019 0140    INR    Component Value Date/Time   INR 1.2 09/09/2019 1450     Intake/Output Summary (Last 24 hours) at 09/19/2019 0714 Last data filed at 09/18/2019 1500 Gross per 24 hour  Intake 720 ml  Output --  Net 720 ml     Assessment:  69 y.o. female is s/p:  TEVAR with iliac conduit   10 Days Post-Op  Plan: -pt with palpable bilateral  radial and DP pulses.   -she is now voiding & creatinine has improved to 1.32. -pt's husband admitted to [facility] yesterday due to advancing dementia.  She would like to discharge to check on her husband.   -looks good from vascular standpoint.  F/u with Dr. Trula Slade in 4 weeks with CTA protocol.  (our office will arrange). -PDMP reviewed and pt gets regularly scheduled narcotics and will not prescribe any narcotics at discharge.   Leontine Locket, PA-C [facility] [phone] 09/19/2019 7:14 AM

## 2019-09-19 NOTE — Discharge Instructions (Signed)
  [facility] [place]   Discharge Instructions  Endovascular Aortic Aneurysm Repair  Please refer to the following instructions for your post-procedure care. Your surgeon or Physician Assistant will discuss any changes with you.  Activity  You are encouraged to walk as much as you can. You can slowly return to normal activities but must avoid strenuous activity and heavy lifting until your doctor tells you it's OK. Avoid activities such as vacuuming or swinging a gold club. It is normal to feel tired for several weeks after your surgery. Do not drive until your doctor gives the OK and you are no longer taking prescription pain medications. It is also normal to have difficulty with sleep habits, eating, and bowel movements after surgery. These will go away with time.  Bathing/Showering  Shower daily after you go home.  Do not soak in a bathtub, hot tub, or swim until the incision heals completely.  If you have incisions in your groin, wash the groin wounds with soap and water daily and pat dry. (No tub bath-only shower)  Then put a dry gauze or washcloth there to keep this area dry to help prevent wound infection daily and as needed.  Do not use Vaseline or neosporin on your incisions.  Only use soap and water on your incisions and then protect and keep dry.  Incision Care  Shower every day. Clean your incision with mild soap and water. Pat the area dry with a clean towel. You do not need a bandage unless otherwise instructed. Do not apply any ointments or creams to your incision. If you clothing is irritating, you may cover your incision with a dry gauze pad.  Diet  Resume your normal diet. There are no special food restrictions following this procedure. A low fat/low cholesterol diet is recommended for all patients with vascular disease. In order to heal from your surgery, it is CRITICAL to get adequate nutrition. Your body requires vitamins, minerals, and protein.  Vegetables are the best source of vitamins and minerals. Vegetables also provide the perfect balance of protein. Processed food has little nutritional value, so try to avoid this.  Medications  Resume taking all of your medications unless your doctor or nurse practitioner tells you not to. If your incision is causing pain, you may take over-the-counter pain relievers such as acetaminophen (Tylenol). If you were prescribed a stronger pain medication, please be aware these medications can cause nausea and constipation. Prevent nausea by taking the medication with a snack or meal. Avoid constipation by drinking plenty of fluids and eating foods with a high amount of fiber, such as fruits, vegetables, and grains.  Do not take Tylenol if you are taking prescription pain medications.   Follow up  Our office will schedule a follow-up appointment with a CT scan 3-4 weeks after your surgery.  Please call us immediately for any of the following conditions  Severe or worsening pain in your legs or feet or in your abdomen back or chest. Increased pain, redness, drainage (pus) from your incision site. Increased abdominal pain, bloating, nausea, vomiting or persistent diarrhea. Fever of 101 degrees or higher. Swelling in your leg (s),  Reduce your risk of vascular disease  Stop smoking. If you would like help call [facility] at 1-800-QUIT-NOW (1-800-784-8669) or [place] at [phone]. Manage your cholesterol Maintain a desired weight Control your diabetes Keep your blood pressure down  If you have questions, please call the office at [phone].  

## 2019-09-19 NOTE — Progress Notes (Signed)
Physical Therapy Treatment Patient Details Name: [name] MRN: [id number] DOB: 05-18-1950 Today's Date: 09/19/2019    History of Present Illness 69 yo admitted to [facility] on 8/8 with abdominal and back pain found to have thoracic aortic dissection with intramural hematoma and HTN transferred to [facility] s/p TEVAR with spinal cord ischemia with LE paralysis with was treated with EVD with improvement. PMHx: HTN, DM, gout, asthma, Afib    PT Comments    Pt very pleasant, concerned about spouse with dementia whose cognition has declined in her absence. Pt able to progress all mobility very well and perform gait without AD and HEP. Pt with mild LOB during gait but aware and adjusting appropriately with education for safety with return home.   HR 92-103 with activity    Follow Up Recommendations  Home health PT     Equipment Recommendations  None recommended by PT    Recommendations for Other Services       Precautions / Restrictions Precautions Precautions: None    Mobility  Bed Mobility Overal bed mobility: Independent                Transfers Overall transfer level: Independent                  Ambulation/Gait Ambulation/Gait assistance: Supervision Gait Distance (Feet): 600 Feet Assistive device: None Gait Pattern/deviations: Step-through pattern;Decreased stride length;Drifts right/left   Gait velocity interpretation: >2.62 ft/sec, indicative of community ambulatory General Gait Details: pt with 3 periods of partial LOB with stepping to gain stability with supervision for safety and pt adjusting speed to slow for safety   Stairs             Wheelchair Mobility    Modified Rankin (Stroke Patients Only)       Balance Overall balance assessment: Mild deficits observed, not formally tested                                          Cognition Arousal/Alertness: Awake/alert Behavior During Therapy: WFL for tasks  assessed/performed Overall Cognitive Status: Within Functional Limits for tasks assessed                                        Exercises General Exercises - Lower Extremity Long Arc Quad: AROM;20 reps;Seated;Both Hip Flexion/Marching: AROM;Both;Seated;20 reps    General Comments        Pertinent Vitals/Pain Pain Assessment: No/denies pain    Home Living                      Prior Function            PT Goals (current goals can now be found in the care plan section) Progress towards PT goals: Progressing toward goals    Frequency    Min 3X/week      PT Plan Current plan remains appropriate    Co-evaluation              AM-PAC PT "6 Clicks" Mobility   Outcome Measure  Help needed turning from your back to your side while in a flat bed without using bedrails?: None Help needed moving from lying on your back to sitting on the side of a flat bed without using bedrails?: None Help needed moving  to and from a bed to a chair (including a wheelchair)?: None Help needed standing up from a chair using your arms (e.g., wheelchair or bedside chair)?: None Help needed to walk in hospital room?: A Little Help needed climbing 3-5 steps with a railing? : A Little 6 Click Score: 22    End of Session Equipment Utilized During Treatment: Gait belt Activity Tolerance: Patient tolerated treatment well Patient left: in chair;with call bell/phone within reach Nurse Communication: Mobility status PT Visit Diagnosis: Other abnormalities of gait and mobility (R26.89)     Time: 3734-2876 PT Time Calculation (min) (ACUTE ONLY): 21 min  Charges:  $Gait Training: 8-22 mins                     [place], PT Acute Rehabilitation Services Pager: [phone] Office: [place] 09/19/2019, 8:07 AM

## 2019-09-19 NOTE — Discharge Summary (Signed)
Physician Discharge Summary  Brittini Brubeck BZJ:[id number] DOB: [birth year] DOA: 08/28/2019  PCP: Antonietta Jewel, MD  Admit date: 08/28/2019 Discharge date: 09/19/2019  Admitted From: Home  Discharge disposition: Home with home health   Recommendations for Outpatient Follow-Up:   . Follow up with your primary care provider in one week.  . Check CBC, BMP, magnesium in the next visit . Follow-up with vascular surgery in 4 weeks- office to contact the patient . Follow-up with Dr. Crissie Sickles cardiology ( as has been scheduled) . Dose of the antihypertensive medications have been decreased.  Might need adjustment as outpatient.   . Dose of Synthroid has been decreased.  Please follow TSH as outpatient.   Discharge Diagnosis:   Active Problems:   Hypertension associated with diabetes ([place])   Intramural aortic hematoma (HCC)   Aortic aneurysm (HCC)   Dissection of thoracic aorta ([place])   Hypoxia   Encounter for central line placement   Hypotension due to drugs   Discharge Condition: Improved.  Diet recommendation: Low sodium, heart healthy.  Diabetic diet  Wound care: None.  Code status: Full.   History of Present Illness:   69 year old female with PMHx of hypertension, tobacco use disorder, atrial fibrillation, hypothyroidism presented to [facility] with abdominal pain for 2 days. She was found to have a descending thoracic aortic aneurysm and was transferred to [facility] for further management.  Patient did have difficult to control hypertension and was taken to the OR on 8/20 endovascular repair.  Patient subsequently was hypotensive and was put on norepinephrine drip.  Patient also received 1 unit of packed RBC.  Subsequently, norepinephrine was weaned off and was put back on oral antihypertensives.  Patient was then considered stable for transfer out of the ICU.   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  Vascular surgery and Cardiology followed the patient during hospitalization.. On  aspirin, and Crestor.  Will continue on discharge  Resistant hypertension Currently on Coreg, clonidine.  Angioedema with lisinopril and amlodipine.  Cardiology recommends Coreg 12.5 mg twice daily clonidine 0.1 mg twice a day aspirin and statins on discharge.  Patient will have to follow-up with cardiology Dr. Lovena Le as outpatient.  History of angioedema with lisinopril and amlodipine.    Spinal cord ischemia post TEVAR.  improved with lumbar drain. Avoid hypotension. Systolic blood pressure goal around 140.  We will continue physical therapy on discharge.  Acute hypoxic respiratory failure  Improved at this time.  Chest x-ray showed atelectasis.  On room air at this time.    Encourage deep breathing.  Acute kidney injury Likely multifactorial from overdiuresis, hypertension.  Creatinine of 1.3  today..  Will closely need to monitor BMP as outpatient.  Has significantly improved at this time.  Leukocytosis with low-grade fever.  Has been improving   Anemia, no active bleeding.  Likely postsurgical.  Received 1 unit of packed RBC.  Hemoglobin of 8.9 at this time.   Hypothyroidism- Continue Synthroid 25 mcg a day.  Dose has been decreased.  TSH needs to be rechecked as outpatient.  Chronic back pain Continue gabapentin and Zoloft, as needed analgesia  Hyperglycemia secondary to type II diabetes mellitus: Resume home Metformin on discharge.  Diabetic diet.  Disposition.  At this time, patient is stable for disposition home with home health services.  Patient will be prescribed home  physical therapy, occupational therapy and walker on discharge.  I was unable to reach the patient's husband on the phone today.  Medical Consultants:    Vascular surgery  PCCM  Cardiology  Procedures:     8/20 Endovascular repair of thoracic aortic ulcer/aneurysm without coverage of left subclavian 8/22 Lumbar drain placed for LE paralysis 8/23 right radial aline 8/23 right IJ CVL>>> 8/29 Central line dced Subjective:   Today, patient was seen and examined at bedside.  Denies any nausea, vomiting fever pain.  Has ambulated with physical therapy.  Discharge Exam:   Vitals:   09/19/19 0805 09/19/19 0856  BP:  (!) 150/78  Pulse: (!) 103 87  Resp:  15  Temp:  99.9 F (37.7 C)  SpO2:  91%   Vitals:   09/18/19 2332 09/19/19 0347 09/19/19 0805 09/19/19 0856  BP: 135/62 137/61  (!) 150/78  Pulse: 85 78 (!) 103 87  Resp: 19 19  15   Temp: 99.6 F (37.6 C) 98.4 F (36.9 C)  99.9 F (37.7 C)  TempSrc: Oral Oral  Oral  SpO2: 98% 98%  91%  Weight:      Height:       General: Alert awake, not in obvious distress HENT: pupils equally reacting to light,  No scleral pallor or icterus noted. Oral mucosa is moist.  Chest:  Clear breath sounds.  Diminished breath sounds bilaterally. No crackles or wheezes.  CVS: S1 &S2 heard. No murmur.  Regular rate and rhythm. Abdomen: Soft, nontender, nondistended.  Bowel sounds are heard.   Extremities: No cyanosis, clubbing or edema.  Peripheral pulses are palpable. Psych: Alert, awake and oriented, normal mood CNS:  No cranial nerve deficits.  Power equal in all extremities.   Skin: Warm and dry.  No rashes noted.  The results of significant diagnostics from this hospitalization (including imaging, microbiology, ancillary and laboratory) are listed below for reference.     Diagnostic Studies:   DG Chest 2 View  Result Date: 08/28/2019 CLINICAL DATA:  Chest pain EXAM: CHEST - 2 VIEW COMPARISON:  05/27/2016 FINDINGS: Mild cardiomegaly with right chest multi lead pacer fibrillator. Both lungs are clear. Disc degenerative disease thoracic spine. IMPRESSION: Mild cardiomegaly without acute abnormality of the lungs.  Electronically Signed   By: Eddie Candle M.D.   On: 08/28/2019 10:56   DG Chest Port 1 View  Result Date: 08/29/2019 CLINICAL DATA:  Shortness of breath. Known aortic intramural hematoma. EXAM: PORTABLE CHEST 1 VIEW COMPARISON:  08/28/2019 FINDINGS: The heart is mildly enlarged but stable. Stable tortuosity, ectasia and calcification of the thoracic aorta. The pacer wires are stable. Stable underlying emphysematous changes but no acute overlying pulmonary process. No pleural effusion or worrisome pulmonary lesions. IMPRESSION: 1. No acute cardiopulmonary findings. 2. Stable cardiac enlargement. 3. Stable emphysematous changes. Electronically Signed   By: Marijo Sanes M.D.   On: 08/29/2019 07:12   ECHOCARDIOGRAM COMPLETE  Result Date: 08/29/2019    ECHOCARDIOGRAM REPORT   Patient Name:   DEMIAH GULLICKSON [facility] Date of Exam: 08/29/2019 Medical Rec #:  [id number]           Height:       60.0 in Accession #:    [id number]          Weight:       115.3 lb Date of Birth:  26-Jan-1950          BSA:          1.477 m Patient Age:  68 years            BP:           115/43 mmHg Patient Gender: F                   HR:           65 bpm. Exam Location:  Inpatient Procedure: 2D Echo, Cardiac Doppler and Color Doppler Indications:    Aortic dissection 441.01 / I71.0  History:        Patient has prior history of Echocardiogram examinations, most                 recent 10/07/2005. COPD, Arrythmias:Atrial Fibrillation; Risk                 Factors:Hypertension, Diabetes and Current Smoker. GERD.  Sonographer:    Vickie Epley RDCS Referring Phys: Herman  1. Left ventricular ejection fraction, by estimation, is 65 to 70%. The left ventricle has normal function. The left ventricle has no regional wall motion abnormalities. There is mild concentric left ventricular hypertrophy. Left ventricular diastolic parameters are consistent with Grade I diastolic dysfunction (impaired relaxation).  2. Right ventricular systolic  function is normal. The right ventricular size is normal. There is moderately elevated pulmonary artery systolic pressure. The estimated right ventricular systolic pressure is 82.4 mmHg.  3. Left atrial size was mild to moderately dilated.  4. Right atrial size was mild to moderately dilated.  5. The mitral valve is degenerative. Trivial mitral valve regurgitation. No evidence of mitral stenosis.  6. The aortic valve is tricuspid. Aortic valve regurgitation is not visualized. Mild aortic valve stenosis. Aortic valve area, by VTI measures 1.97 cm. Aortic valve mean gradient measures 12.5 mmHg. Aortic valve Vmax measures 2.35 m/s.  7. The inferior vena cava is dilated in size with >50% respiratory variability, suggesting right atrial pressure of 8 mmHg. FINDINGS  Left Ventricle: Left ventricular ejection fraction, by estimation, is 65 to 70%. The left ventricle has normal function. The left ventricle has no regional wall motion abnormalities. The left ventricular internal cavity size was normal in size. There is  mild concentric left ventricular hypertrophy. Left ventricular diastolic parameters are consistent with Grade I diastolic dysfunction (impaired relaxation). Right Ventricle: The right ventricular size is normal. No increase in right ventricular wall thickness. Right ventricular systolic function is normal. There is moderately elevated pulmonary artery systolic pressure. The tricuspid regurgitant velocity is 3.27 m/s, and with an assumed right atrial pressure of 8 mmHg, the estimated right ventricular systolic pressure is 23.5 mmHg. Left Atrium: Left atrial size was mild to moderately dilated. Right Atrium: Right atrial size was mild to moderately dilated. Pericardium: Trivial pericardial effusion is present. Mitral Valve: The mitral valve is degenerative in appearance. There is mild calcification of the posterior mitral valve leaflet(s). Mild mitral annular calcification. Trivial mitral valve regurgitation.  No evidence of mitral valve stenosis. Tricuspid Valve: The tricuspid valve is grossly normal. Tricuspid valve regurgitation is mild . No evidence of tricuspid stenosis. Aortic Valve: The aortic valve is tricuspid. . There is moderate thickening and moderate calcification of the aortic valve. Aortic valve regurgitation is not visualized. Mild aortic stenosis is present. There is moderate thickening of the aortic valve. There is moderate calcification of the aortic valve. Aortic valve mean gradient measures 12.5 mmHg. Aortic valve peak gradient measures 22.1 mmHg. Aortic valve area, by VTI measures 1.97 cm. Pulmonic Valve: The pulmonic valve was grossly normal.  Pulmonic valve regurgitation is mild. No evidence of pulmonic stenosis. Aorta: The aortic root and ascending aorta are structurally normal, with no evidence of dilitation. Venous: The inferior vena cava is dilated in size with greater than 50% respiratory variability, suggesting right atrial pressure of 8 mmHg. IAS/Shunts: The atrial septum is grossly normal. Additional Comments: A pacer wire is visualized in the right atrium and right ventricle.  LEFT VENTRICLE PLAX 2D LVIDd:         4.50 cm  Diastology LVIDs:         3.00 cm  LV e' lateral:   6.49 cm/s LV PW:         1.10 cm  LV E/e' lateral: 12.7 LV IVS:        1.10 cm  LV e' medial:    4.87 cm/s LVOT diam:     2.00 cm  LV E/e' medial:  17.0 LV SV:         99 LV SV Index:   67 LVOT Area:     3.14 cm  RIGHT VENTRICLE RV S prime:     15.10 cm/s TAPSE (M-mode): 3.0 cm LEFT ATRIUM             Index       RIGHT ATRIUM           Index LA diam:        4.10 cm 2.78 cm/m  RA Area:     24.10 cm LA Vol (A2C):   57.3 ml 38.79 ml/m RA Volume:   84.30 ml  57.07 ml/m LA Vol (A4C):   59.2 ml 40.08 ml/m LA Biplane Vol: 62.3 ml 42.18 ml/m  AORTIC VALVE AV Area (Vmax):    1.93 cm AV Area (Vmean):   1.98 cm AV Area (VTI):     1.97 cm AV Vmax:           235.00 cm/s AV Vmean:          166.500 cm/s AV VTI:             0.500 m AV Peak Grad:      22.1 mmHg AV Mean Grad:      12.5 mmHg LVOT Vmax:         144.00 cm/s LVOT Vmean:        105.000 cm/s LVOT VTI:          0.314 m LVOT/AV VTI ratio: 0.63  AORTA Ao Root diam: 3.00 cm Ao Asc diam:  3.80 cm MITRAL VALVE                TRICUSPID VALVE MV Area (PHT): 3.40 cm     TR Peak grad:   42.8 mmHg MV Decel Time: 223 msec     TR Vmax:        327.00 cm/s MV E velocity: 82.70 cm/s MV A velocity: 102.00 cm/s  SHUNTS MV E/A ratio:  0.81         Systemic VTI:  0.31 m                             Systemic Diam: 2.00 cm Eleonore Chiquito MD Electronically signed by Eleonore Chiquito MD Signature Date/Time: 08/29/2019/4:51:17 PM    Final    CT Angio Chest/Abd/Pel for Dissection W and/or W/WO  Result Date: 08/28/2019 CLINICAL DATA:  Chest pain, aortic dissection suspected EXAM: CT ANGIOGRAPHY CHEST, ABDOMEN AND PELVIS TECHNIQUE: Non-contrast CT of the chest  was initially obtained. Multidetector CT imaging through the chest, abdomen and pelvis was performed using the standard protocol during bolus administration of intravenous contrast. Multiplanar reconstructed images and MIPs were obtained and reviewed to evaluate the vascular anatomy. CONTRAST:  51mL OMNIPAQUE IOHEXOL 350 MG/ML SOLN COMPARISON:  None. FINDINGS: CTA CHEST FINDINGS Cardiovascular: Preferential opacification of the thoracic aorta. Aortic atherosclerosis. There is a hyperdense, acute appearing intramural hematoma of the distal aortic arch and descending thoracic aorta (originating at or distal to the origin of the left subclavian artery), the vessel caliber aneurysmal, measuring up to 3.7 x 3.5 cm (series 5, image 46). Thickness of hematoma measures up to 1.3 cm. There is a small ulceration at the medial aspect of the proximal descending thoracic aorta, measuring no greater than 5 mm (series 5, image 38). There is no other contrast opacification the ascending thoracic aorta and arch are normal in caliber. Normal heart size. Scattered  coronary artery calcifications. No pericardial effusion. Mediastinum/Nodes: No enlarged mediastinal, hilar, or axillary lymph nodes. Thyroid gland, trachea, and esophagus demonstrate no significant findings. Lungs/Pleura: Mild centrilobular emphysema. Bandlike scarring and or atelectasis of the bilateral lung bases. No pleural effusion or pneumothorax. Musculoskeletal: No chest wall abnormality. No acute or significant osseous findings. Review of the MIP images confirms the above findings. CTA ABDOMEN AND PELVIS FINDINGS VASCULAR Intramural hematoma described above extends to approximately the level of the right renal artery, without evident involvement of the branch vessel origins. The abdominal aorta is nonaneurysmal. Aortic atherosclerosis. Standard branching pattern of the abdominal aorta with solitary bilateral renal arteries. Review of the MIP images confirms the above findings. NON-VASCULAR Hepatobiliary: No solid liver abnormality is seen. No gallstones, gallbladder wall thickening, or biliary dilatation. Pancreas: Unremarkable. No pancreatic ductal dilatation or surrounding inflammatory changes. Spleen: Normal in size without significant abnormality. Adrenals/Urinary Tract: Adrenal glands are unremarkable. Kidneys are normal, without renal calculi, solid lesion, or hydronephrosis. Bladder is unremarkable. Stomach/Bowel: Stomach is within normal limits. Appendix appears normal. No evidence of bowel wall thickening, distention, or inflammatory changes. Lymphatic: No enlarged abdominal or pelvic lymph nodes. Reproductive: Status post hysterectomy. Other: No abdominal wall hernia or abnormality. No abdominopelvic ascites. Musculoskeletal: No acute or significant osseous findings. Review of the MIP images confirms the above findings. IMPRESSION: 1. There is a hyperdense, acute appearing intramural hematoma of the distal aortic arch and descending thoracic aorta (originating at or distal to the origin of the left  subclavian artery), the descending thoracic aorta is aneurysmal, measuring up to 3.7 x 3.5 cm. Thickness of hematoma measures up to 1.3 cm. 2. There is a small ulceration at the medial aspect of the proximal descending thoracic aorta, measuring no greater than 5 mm. 3. Intramural hematoma described above extends into the abdominal aorta to approximately the level of the right renal artery, without evident involvement of the branch vessel origins. The abdominal aorta is nonaneurysmal. 4. Aortic Atherosclerosis (ICD10-I70.0). 5. Coronary artery disease. These results were called by telephone at the time of interpretation on 08/28/2019 at 12:07 pm to PA Martinique ROBINSON , who verbally acknowledged these results. Electronically Signed   By: Eddie Candle M.D.   On: 08/28/2019 12:16     Labs:   Basic Metabolic Panel: Recent Labs  Lab 09/15/19 0256 09/15/19 0256 09/16/19 0355 09/16/19 0355 09/17/19 0420 09/17/19 0420 09/18/19 0310 09/19/19 0140  NA 138  --  137  --  135  --  134* 136  K 3.8   < > 4.1   < >  4.0   < > 3.8 4.1  CL 100  --  100  --  98  --  99 100  CO2 28  --  26  --  26  --  27 25  GLUCOSE 109*  --  103*  --  132*  --  190* 157*  BUN 13  --  26*  --  32*  --  29* 25*  CREATININE 1.08*  --  2.57*  --  2.24*  --  1.70* 1.32*  CALCIUM 9.0  --  8.9  --  9.1  --  8.8* 8.6*  MG 1.7  --   --   --   --   --  2.0  --    < > = values in this interval not displayed.   GFR Estimated Creatinine Clearance: 34.8 mL/min (A) (by C-G formula based on SCr of 1.32 mg/dL (H)). Liver Function Tests: Recent Labs  Lab 09/18/19 0310  AST 17  ALT 10  ALKPHOS 59  BILITOT 0.5  PROT 6.3*  ALBUMIN 2.4*   No results for input(s): LIPASE, AMYLASE in the last 168 hours. No results for input(s): AMMONIA in the last 168 hours. Coagulation profile No results for input(s): INR, PROTIME in the last 168 hours.  CBC: Recent Labs  Lab 09/15/19 0256 09/16/19 0355 09/17/19 0420 09/18/19 0310  09/19/19 0140  WBC 16.6* 10.6* 11.6* 11.5* 12.1*  HGB 10.4* 8.9* 9.4* 8.9* 9.5*  HCT 33.2* 29.2* 29.5* 28.8* 30.8*  MCV 83.6 83.4 81.7 81.6 81.3  PLT 501* 453* 509* 483* 478*   Cardiac Enzymes: No results for input(s): CKTOTAL, CKMB, CKMBINDEX, TROPONINI in the last 168 hours. BNP: Invalid input(s): POCBNP CBG: Recent Labs  Lab 09/18/19 0723 09/18/19 1122 09/18/19 1601 09/18/19 2135 09/19/19 0605  GLUCAP 81 135* 168* 175* 108*   D-Dimer No results for input(s): DDIMER in the last 72 hours. Hgb A1c No results for input(s): HGBA1C in the last 72 hours. Lipid Profile No results for input(s): CHOL, HDL, LDLCALC, TRIG, CHOLHDL, LDLDIRECT in the last 72 hours. Thyroid function studies No results for input(s): TSH, T4TOTAL, T3FREE, THYROIDAB in the last 72 hours.  Invalid input(s): FREET3 Anemia work up No results for input(s): VITAMINB12, FOLATE, FERRITIN, TIBC, IRON, RETICCTPCT in the last 72 hours. Microbiology No results found for this or any previous visit (from the past 240 hour(s)).   Discharge Instructions:   Discharge Instructions    Call MD for:  persistant nausea and vomiting   Complete by: As directed    Call MD for:  redness, tenderness, or signs of infection (pain, swelling, redness, odor or green/yellow discharge around incision site)   Complete by: As directed    Call MD for:  severe uncontrolled pain   Complete by: As directed    Call MD for:  temperature >100.4   Complete by: As directed    Diet - low sodium heart healthy   Complete by: As directed    Diet Carb Modified   Complete by: As directed    Discharge instructions   Complete by: As directed    Follow-up with your primary care physician in 1 week.  Follow-up with Dr. Lovena Le cardiology as scheduled by you.  Follow-up with vascular surgery as scheduled by the clinic in 3-4 weeks. Please take a note that your medications and doses have changed.   Increase activity slowly   Complete by: As  directed    No wound care   Complete by: As  directed      Allergies as of 09/19/2019      Reactions   Amlodipine Swelling   Started very close to episode of angioedema to ACE, unknown if this added effects or just residual   Lisinopril    angioedema   Losartan Swelling   Lip swelling   Codeine Phosphate Other (See Comments)   REACTION: UNKNOWN   Morphine And Related Other (See Comments)   Cannot take:Heart problems   Naproxen Nausea And Vomiting   Nsaids Nausea Only   Penicillins    UNKNOWN REACTION   Propoxyphene N-acetaminophen Other (See Comments)   Makes me gag   Sulfamethoxazole-trimethoprim Other (See Comments)   Gi upset   Flexeril [cyclobenzaprine] Anxiety, Other (See Comments)   Pt states medication gives her nightmares and insomnia.      Medication List    STOP taking these medications   hydrALAZINE 100 MG tablet Commonly known as: APRESOLINE   hydrochlorothiazide 12.5 MG tablet Commonly known as: HYDRODIURIL   lovastatin 20 MG tablet Commonly known as: MEVACOR   potassium chloride SA 20 MEQ tablet Commonly known as: KLOR-CON     TAKE these medications   aspirin 81 MG tablet Take 81 mg by mouth every morning.   carvedilol 12.5 MG tablet Commonly known as: COREG Take 1 tablet (12.5 mg total) by mouth 2 (two) times daily with a meal. What changed:   medication strength  how much to take   cholecalciferol 1000 units tablet Commonly known as: VITAMIN D Take 1,000 Units by mouth every morning.   cloNIDine 0.1 MG tablet Commonly known as: CATAPRES Take 1 tablet (0.1 mg total) by mouth 2 (two) times daily. What changed:   medication strength  how much to take  when to take this   gabapentin 100 MG capsule Commonly known as: NEURONTIN Take 1 capsule (100 mg total) by mouth 3 (three) times daily.   levothyroxine 25 MCG tablet Commonly known as: SYNTHROID Take 1 tablet (25 mcg total) by mouth daily. What changed:   medication  strength  how much to take   lidocaine 4 % cream Commonly known as: LMX Apply topically 4 (four) times daily as needed (left upper extremity pain).   melatonin 3 MG Tabs tablet Take 3 tablets (9 mg total) by mouth at bedtime.   metFORMIN 500 MG tablet Commonly known as: GLUCOPHAGE Take 500 mg by mouth 2 (two) times daily.   multivitamin tablet Take 1 tablet by mouth every morning.   nicotine 7 mg/24hr patch Commonly known as: NICODERM CQ - dosed in mg/24 hr Place 1 patch (7 mg total) onto the skin daily. Start taking on: September 20, 2019   Oxycodone HCl 10 MG Tabs Take 10 mg by mouth 4 (four) times daily as needed.   pantoprazole 40 MG tablet Commonly known as: PROTONIX Take 1 tablet (40 mg total) by mouth daily. Start taking on: September 20, 2019   polyethylene glycol 17 g packet Commonly known as: MIRALAX / GLYCOLAX Take 17 g by mouth 2 (two) times daily.   rosuvastatin 20 MG tablet Commonly known as: CRESTOR Take 1 tablet (20 mg total) by mouth daily. Start taking on: September 20, 2019   sertraline 100 MG tablet Commonly known as: ZOLOFT Take 50 mg by mouth at bedtime.       Follow-up Information    Serafina Mitchell, MD In 4 weeks.   Specialties: Vascular Surgery, Cardiology Why: Office will call you to arrange your appt (sent)  Contact information: [place] [address] Patrick, Sami, MD. Schedule an appointment as soon as possible for a visit in 1 week(s).   Specialty: Internal Medicine Why: regular checkup Contact information: [address] [place] [address] [phone]        Evans Lance, MD .   Specialty: Cardiology Contact information: [place] [address] [phone]                Time coordinating discharge: 39 minutes  Signed:  Rebbecca Osuna  [facility] 09/19/2019, 10:06 AM

## 2019-09-19 NOTE — Care Management Important Message (Signed)
Important Message  Patient Details  Name: [name] MRN: [id number] Date of Birth: December 21, 1950   Medicare Important Message Given:  Yes Mailed IM letter to patient upon discharged.    [name] 09/19/2019, 4:23 PM

## 2019-09-19 NOTE — Progress Notes (Signed)
Order received to discharge patient.  Telemetry monitor removed and CCMD notified.  PIV access removed.  Discharge instructions, follow up, medications and instructions for their use discussed with patient. 

## 2019-09-19 NOTE — TOC Transition Note (Signed)
Transition of Care [facility]) - CM/SW Discharge Note   Patient Details  Name: [name] MRN: [id number] Date of Birth: 06/13/1950  Transition of Care [facility]) CM/SW Contact:  Bartholomew Crews, RN Phone Number: [phone] 09/19/2019, 11:27 AM   Clinical Narrative:     Spoke with patient on her room phone to discuss transition planning. PTA home with spouse. She stated that she has been his caregiver for 13 years, because he has dementia. Discussed recommendation for 3N1, she declined stating that she can get to the bathroom without difficulty. Discussed recommendations for home health PT, OT and offered agency choice. Referral accepted by [facility]. HH orders for PT, OT already provided by [facility]. Patient asked about assistance with cooking/cleaning - advised that this would be private pay. She stated that her son will provide transportation home. No further TOC needs identified.   Final next level of care: Startup Barriers to Discharge: No Barriers Identified   Patient Goals and CMS Choice Patient states their goals for this hospitalization and ongoing recovery are:: return home CMS Medicare.gov Compare Post Acute Care list provided to:: Patient Choice offered to / list presented to : Patient  Discharge Placement                       Discharge Plan and Services In-house Referral: NA Discharge Planning Services: CM Consult Post Acute Care Choice: Home Health, Durable Medical Equipment          DME Arranged: 3-N-1 (declined 3N1 stating she doesn't need it) DME Agency: NA       HH Arranged: PT, OT HH Agency: [facility] Date [place]: 09/19/19 Time [place]: 1127 Representative spoke with at [place]: Cassie  Social Determinants of Health ([place]) Interventions     Readmission Risk Interventions No flowsheet data found.

## 2019-10-10 ENCOUNTER — Other Ambulatory Visit: Payer: Self-pay | Admitting: *Deleted

## 2019-10-10 DIAGNOSIS — I71019 Dissection of thoracic aorta, unspecified: Secondary | ICD-10-CM

## 2019-10-10 DIAGNOSIS — I7101 Dissection of thoracic aorta: Secondary | ICD-10-CM

## 2019-10-11 ENCOUNTER — Ambulatory Visit: Payer: Medicare Other | Admitting: Internal Medicine

## 2019-10-11 ENCOUNTER — Encounter: Payer: Self-pay | Admitting: Internal Medicine

## 2019-10-11 ENCOUNTER — Other Ambulatory Visit: Payer: Self-pay

## 2019-10-11 VITALS — BP 166/90 | HR 84 | Ht 65.0 in | Wt 145.0 lb

## 2019-10-11 DIAGNOSIS — I7100 Dissection of unspecified site of aorta: Secondary | ICD-10-CM

## 2019-10-11 MED ORDER — CARVEDILOL 25 MG PO TABS: 25.0000 mg | ORAL_TABLET | Freq: Two times a day (BID) | ORAL | 3 refills | Status: AC

## 2019-10-11 NOTE — Patient Instructions (Signed)
Medication Instructions:  Your physician has recommended you make the following change in your medication:   Increase Coreg to 25 mg Two Times Daily   *If you need a refill on your cardiac medications before your next appointment, please call your pharmacy*   Lab Work: NONE   If you have labs (blood work) drawn today and your tests are completely normal, you will receive your results only by: Marland Kitchen MyChart Message (if you have MyChart) OR . A paper copy in the mail If you have any lab test that is abnormal or we need to change your treatment, we will call you to review the results.   Testing/Procedures: NONE    Follow-Up: At [facility], you and your health needs are our priority.  As part of our continuing mission to provide you with exceptional heart care, we have created designated Provider Care Teams.  These Care Teams include your primary Cardiologist (physician) and Advanced Practice Providers (APPs -  Physician Assistants and Nurse Practitioners) who all work together to provide you with the care you need, when you need it.  We recommend signing up for the patient portal called "MyChart".  Sign up information is provided on this After Visit Summary.  MyChart is used to connect with patients for Virtual Visits (Telemedicine).  Patients are able to view lab/test results, encounter notes, upcoming appointments, etc.  Non-urgent messages can be sent to your provider as well.   To learn more about what you can do with MyChart, go to [email or web address].    Your next appointment:   6 month(s)  The format for your next appointment:   In Person  Provider:   Cristopher Peru, MD   Other Instructions Thank you for choosing [facility]!

## 2019-10-11 NOTE — Progress Notes (Signed)
HPI Ms. [name] returns after a long absence from our EP clinic. I have not seen her since 2016.She is a pleasant 69 yo woman with a h/o peripheral vascular disease, HTN, sinus node dysfunction, s/p PPM insertion. She has been treated for an intramural hematoma with stenting by Dr. Trula Slade. At discharge her bp meds were reduced. Since she has been home, she has felt ok but notes that her bp is not well controlled. No edema, chest pain or syncope.  Allergies  Allergen Reactions  . Amlodipine Swelling    Started very close to episode of angioedema to ACE, unknown if this added effects or just residual  . Lisinopril     angioedema  . Losartan Swelling    Lip swelling   . Codeine Phosphate Other (See Comments)    REACTION: UNKNOWN  . Morphine And Related Other (See Comments)    Cannot take:Heart problems  . Naproxen Nausea And Vomiting  . Nsaids Nausea Only  . Penicillins     UNKNOWN REACTION  . Propoxyphene N-Acetaminophen Other (See Comments)    Makes me gag  . Sulfamethoxazole-Trimethoprim Other (See Comments)    Gi upset  . Flexeril [Cyclobenzaprine] Anxiety and Other (See Comments)    Pt states medication gives her nightmares and insomnia.     Current Outpatient Medications  Medication Sig Dispense Refill  . aspirin 81 MG tablet Take 81 mg by mouth every morning.     . carvedilol (COREG) 12.5 MG tablet Take 1 tablet (12.5 mg total) by mouth 2 (two) times daily with a meal. 60 tablet 2  . cholecalciferol (VITAMIN D) 1000 UNITS tablet Take 1,000 Units by mouth every morning.     . cloNIDine (CATAPRES) 0.1 MG tablet Take 1 tablet (0.1 mg total) by mouth 2 (two) times daily. 60 tablet 2  . gabapentin (NEURONTIN) 100 MG capsule Take 1 capsule (100 mg total) by mouth 3 (three) times daily. 90 capsule 2  . levothyroxine (SYNTHROID) 25 MCG tablet Take 1 tablet (25 mcg total) by mouth daily. 30 tablet 2  . lidocaine (LMX) 4 % cream Apply topically 4 (four) times daily as needed  (left upper extremity pain). 30 g 0  . melatonin 3 MG TABS tablet Take 3 tablets (9 mg total) by mouth at bedtime. 100 tablet 0  . metFORMIN (GLUCOPHAGE) 500 MG tablet Take 500 mg by mouth 2 (two) times daily.    . Multiple Vitamin (MULTIVITAMIN) tablet Take 1 tablet by mouth every morning.     . nicotine (NICODERM CQ - DOSED IN MG/24 HR) 7 mg/24hr patch Place 1 patch (7 mg total) onto the skin daily. 28 patch 0  . Oxycodone HCl 10 MG TABS Take 10 mg by mouth 4 (four) times daily as needed.    . pantoprazole (PROTONIX) 40 MG tablet Take 1 tablet (40 mg total) by mouth daily. 30 tablet 1  . polyethylene glycol (MIRALAX / GLYCOLAX) packet Take 17 g by mouth 2 (two) times daily.     . rosuvastatin (CRESTOR) 20 MG tablet Take 1 tablet (20 mg total) by mouth daily. 30 tablet 2  . sertraline (ZOLOFT) 100 MG tablet Take 50 mg by mouth at bedtime.      No current facility-administered medications for this visit.     Past Medical History:  Diagnosis Date  . Anemia   . Anxiety   . Arthritis   . Atrial fib/flutter, transient   . COPD (chronic obstructive pulmonary disease) ([place])   .  Depression   . Diabetes mellitus   . Diverticulosis   . DJD (degenerative joint disease)   . GERD (gastroesophageal reflux disease)   . Gout   . History of pneumonia   . Hypertension   . Hypothyroidism   . Internal hemorrhoids     ROS:   All systems reviewed and negative except as noted in the HPI.   Past Surgical History:  Procedure Laterality Date  . CATARACT EXTRACTION W/ INTRAOCULAR LENS IMPLANT Left   . ENDARTERECTOMY Left 09/09/2019   Procedure: LEFT ILIAC ENDARTERECTOMY;  Surgeon: Serafina [place], MD;  Location: [facility];  Service: Vascular;  Laterality: Left;  . HERNIA REPAIR    . INSERTION OF ILIAC STENT Left 09/09/2019   Procedure: INSERTION OF LEFT ILIAC STENT;  Surgeon: Serafina [place], MD;  Location: [facility];  Service: Vascular;  Laterality: Left;  . PACEMAKER INSERTION    . THORACIC AORTIC  ENDOVASCULAR STENT GRAFT N/A 09/09/2019   Procedure: THORACIC AORTIC ENDOVASCULAR STENT GRAFT WITH LEFT ILIAC CONDUIT;  Surgeon: Serafina [place], MD;  Location: [facility];  Service: Vascular;  Laterality: N/A;  . TUBAL LIGATION    . ULTRASOUND GUIDANCE FOR VASCULAR ACCESS Left 09/09/2019   Procedure: ULTRASOUND GUIDANCE FOR VASCULAR ACCESS;  Surgeon: Serafina [place], MD;  Location: [facility];  Service: Vascular;  Laterality: Left;  Marland Kitchen VAGINAL HYSTERECTOMY       Family History  Problem Relation Age of Onset  . Prostate cancer Father   . Hypertension Mother   . Heart attack Mother   . Heart disease Mother   . Stomach cancer Son   . Diabetes Maternal Aunt   . Heart disease Maternal Uncle   . Colon cancer Neg Hx   . Esophageal cancer Neg Hx   . Rectal cancer Neg Hx      Social History   Socioeconomic History  . Marital status: Married    Spouse name: Not on file  . Number of children: 2  . Years of education: Not on file  . Highest education level: Not on file  Occupational History  . Occupation: Disabled    Employer: UNEMPLOYED  Tobacco Use  . Smoking status: Current Some Day Smoker    Packs/day: 0.50    Years: 30.00    Pack years: 15.00    Types: Cigarettes  . Smokeless tobacco: Never Used  Vaping Use  . Vaping Use: Never used  Substance and Sexual Activity  . Alcohol use: No  . Drug use: No  . Sexual activity: Yes    Birth control/protection: Surgical  Other Topics Concern  . Not on file  Social History Narrative  . Not on file   Social Determinants of Health   Financial Resource Strain:   . Difficulty of Paying Living Expenses: Not on file  Food Insecurity:   . Worried About Charity fundraiser in the Last Year: Not on file  . Ran Out of Food in the Last Year: Not on file  Transportation Needs:   . Lack of Transportation (Medical): Not on file  . Lack of Transportation (Non-Medical): Not on file  Physical Activity:   . Days of Exercise per Week: Not on file  .  Minutes of Exercise per Session: Not on file  Stress:   . Feeling of Stress : Not on file  Social Connections:   . Frequency of Communication with Friends and Family: Not on file  . Frequency of Social Gatherings with Friends and Family:  Not on file  . Attends Religious Services: Not on file  . Active Member of Clubs or Organizations: Not on file  . Attends Archivist Meetings: Not on file  . Marital Status: Not on file  Intimate Partner Violence:   . Fear of Current or Ex-Partner: Not on file  . Emotionally Abused: Not on file  . Physically Abused: Not on file  . Sexually Abused: Not on file     BP (!) 166/90   Pulse 84   Ht 5\' 5"  (1.651 m)   Wt 145 lb (65.8 kg)   SpO2 95%   BMI 24.13 kg/m   Physical Exam:  Well appearing 69 yo woman, NAD HEENT: Unremarkable Neck:  No JVD, no thyromegally Lymphatics:  No adenopathy Back:  No CVA tenderness Lungs:  Clear with no wheezes HEART:  Regular rate rhythm, no murmurs, no rubs, no clicks Abd:  soft, positive bowel sounds, no organomegally, no rebound, no guarding Ext:  2 plus pulses, no edema, no cyanosis, no clubbing Skin:  No rashes no nodules Neuro:  CN II through XII intact, motor grossly intact  DEVICE  Normal device function.  See PaceArt for details.   Assess/Plan: 1. Sinus node dysfunction - she is asymptomatic s/p PPM insertion.  2. PPM - her medtronic DDD PM is working normally. 3. HTN - her bp is elevated. I have asked her to reduce her sodium intake and increase her dose of coreg to 25 mg bid 4. PVD - she denies claudication. She is s/p revascularization and stenting of her thoracic aorta.  Kristy Overlie Chakara Bognar,MD

## 2019-10-14 ENCOUNTER — Other Ambulatory Visit: Payer: Self-pay

## 2019-10-14 ENCOUNTER — Ambulatory Visit (HOSPITAL_COMMUNITY)
Admission: RE | Admit: 2019-10-14 | Discharge: 2019-10-14 | Disposition: A | Discharge status: Home / Self Care | Payer: Medicare Other | Source: Ambulatory Visit | Attending: Surgery | Admitting: Surgery

## 2019-10-14 DIAGNOSIS — I71019 Dissection of thoracic aorta, unspecified: Secondary | ICD-10-CM

## 2019-10-14 DIAGNOSIS — I7101 Dissection of thoracic aorta: Secondary | ICD-10-CM | POA: Diagnosis not present

## 2019-10-14 MED ORDER — IOHEXOL 350 MG/ML SOLN
100.0000 mL | Freq: Once | INTRAVENOUS | Status: AC | PRN
  Administered 2019-10-14: 100 mL via INTRAVENOUS

## 2019-10-17 ENCOUNTER — Other Ambulatory Visit: Payer: Self-pay

## 2019-10-17 ENCOUNTER — Encounter: Payer: Self-pay | Admitting: Surgery

## 2019-10-17 ENCOUNTER — Ambulatory Visit (INDEPENDENT_AMBULATORY_CARE_PROVIDER_SITE_OTHER): Payer: Self-pay | Admitting: Surgery

## 2019-10-17 VITALS — BP 174/91 | HR 73 | Temp 97.7°F | Resp 20 | Ht 65.0 in | Wt 148.0 lb

## 2019-10-17 DIAGNOSIS — I712 Thoracic aortic aneurysm, without rupture, unspecified: Secondary | ICD-10-CM

## 2019-10-17 NOTE — Progress Notes (Signed)
Patient name: [name] MRN: [id number] DOB: Dec 10, 1950 Sex: female  REASON FOR VISIT:     post op  HISTORY OF PRESENT ILLNESS:   [name] is a 69 y.o. female who presented to the hospital in August 2021 with chest and back pain.  She was very hypertensive.  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  She was initially managed nonoperatively, however we could not get her blood pressure stabilized.  Ultimately on 09/09/2019 she underwent endovascular stent graft placement without left subclavian artery coverage.  This required a conduit off of the left external iliac artery.  She did have plaque at the level of the conduit which was evaluated with angiography from the groin after the procedure, and a stenosis was visualized so this was stented.  On postoperative day 2 she experienced lower extremity paralysis.  A spinal drain was placed and her weakness ultimately resolved.  She did develop a acute episode of renal insufficiency which resolved on its own.  The patient has a diagnosis of A. fib but is only taking aspirin.  She is a current smoker and has COPD.  She is medically managed for hypertension.  She is a diabetic.  She is medically managed for hypertension.  She takes a statin for hypercholesterolemia.   CURRENT MEDICATIONS:    Current Outpatient Medications  Medication Sig Dispense Refill  . aspirin 81 MG tablet Take 81 mg by mouth every morning.     . carvedilol (COREG) 25 MG tablet Take 1 tablet (25 mg total) by mouth 2 (two) times daily with a meal. 180 tablet 3  . cholecalciferol (VITAMIN D) 1000 UNITS tablet Take 1,000 Units by mouth every morning.     . cloNIDine (CATAPRES) 0.1 MG tablet Take 1 tablet (0.1 mg total) by mouth 2 (two) times daily. 60 tablet 2  . gabapentin (NEURONTIN) 100 MG capsule Take 1 capsule (100 mg total) by mouth 3 (three) times daily. 90 capsule 2  . levothyroxine  (SYNTHROID) 25 MCG tablet Take 1 tablet (25 mcg total) by mouth daily. 30 tablet 2  . lidocaine (LMX) 4 % cream Apply topically 4 (four) times daily as needed (left upper extremity pain). 30 g 0  . melatonin 3 MG TABS tablet Take 3 tablets (9 mg total) by mouth at bedtime. 100 tablet 0  . metFORMIN (GLUCOPHAGE) 500 MG tablet Take 500 mg by mouth 2 (two) times daily.    . Multiple Vitamin (MULTIVITAMIN) tablet Take 1 tablet by mouth every morning.     . nicotine (NICODERM CQ - DOSED IN MG/24 HR) 7 mg/24hr patch Place 1 patch (7 mg total) onto the skin daily. 28 patch 0  . Oxycodone HCl 10 MG TABS Take 10 mg by mouth 4 (four) times daily as needed.    . pantoprazole (PROTONIX) 40 MG tablet Take 1 tablet (40 mg total) by mouth daily. 30 tablet 1  . polyethylene glycol (MIRALAX / GLYCOLAX) packet Take 17 g by mouth 2 (two) times daily.     . rosuvastatin (CRESTOR) 20 MG tablet Take 1 tablet (20 mg total) by mouth daily. 30 tablet 2  . sertraline (ZOLOFT) 100 MG tablet Take 50 mg by mouth at bedtime.      No current facility-administered medications for this visit.    REVIEW OF SYSTEMS:   [X]  denotes positive finding, [ ]  denotes negative finding Cardiac  Comments:  Chest pain or chest pressure:    Shortness of  breath upon exertion:    Short of breath when lying flat:    Irregular heart rhythm:    Constitutional    Fever or chills:      PHYSICAL EXAM:   Vitals:   10/17/19 1528  BP: (!) 174/91  Pulse: 73  Resp: 20  Temp: 97.7 F (36.5 C)  SpO2: 92%  Weight: 148 lb (67.1 kg)  Height: 5\' 5"  (1.651 m)    GENERAL: The patient is a well-nourished female, in no acute distress. The vital signs are documented above. CARDIOVASCULAR: There is a regular rate and rhythm. PULMONARY: Non-labored respirations Both the left flank and left groin incision have completely healed  STUDIES:   CTA: 1. Status post zone 3-5 thoracic aortic endograft repair without evidence of endoleak.  Significant interval decreased prominence of previously visualized intramural hematoma in the treated segment of the thoracic aorta. 2. Interval placement of proximal left common iliac stent with pseudoaneurysm formation along the anterolateral aspect of the distal stent (series 6, image 142), effectively representing an endoleak of the stent. Alternatively, this could represent trapped contrast after external iliac conduit creation as described in the operative report. Recommend attention on followup.  NONVASCULAR:  1.  Emphysema (ICD10-J43.9). 2. Large colonic stool burden.   MEDICAL ISSUES:   Her stent graft is in excellent position.  The area of concern for pseudoaneurysm formation in the left common iliac artery actually represents the stump of the iliac conduit.  This is not a pseudoaneurysm but rather a piece of the [place].  I will repeat her CTA in 6 months  HTN:  Her blood pressure remains poorly controlled.  I spoke with Dr. Oval Linsey this afternoon.  We are going to double her clonidine.  And she is going to make an appointment in the near future.  Leia Alf, MD, FACS [facility] [phone] Pager [phone]

## 2019-10-26 ENCOUNTER — Ambulatory Visit: Payer: Medicare Other | Admitting: Cardiovascular Disease

## 2019-11-07 ENCOUNTER — Ambulatory Visit: Payer: Medicare Other | Admitting: Cardiovascular Disease

## 2019-11-16 ENCOUNTER — Other Ambulatory Visit: Payer: Self-pay | Admitting: Internal Medicine

## 2019-11-16 DIAGNOSIS — Z1231 Encounter for screening mammogram for malignant neoplasm of breast: Secondary | ICD-10-CM

## 2019-11-28 ENCOUNTER — Encounter: Payer: Self-pay | Admitting: Cardiovascular Disease

## 2019-11-28 ENCOUNTER — Ambulatory Visit: Payer: Medicare Other | Admitting: Cardiovascular Disease

## 2019-11-28 ENCOUNTER — Telehealth: Payer: Self-pay

## 2019-11-28 VITALS — BP 150/120 | Ht 65.0 in | Wt 160.0 lb

## 2019-11-28 DIAGNOSIS — I7101 Dissection of thoracic aorta: Secondary | ICD-10-CM

## 2019-11-28 DIAGNOSIS — I480 Paroxysmal atrial fibrillation: Secondary | ICD-10-CM

## 2019-11-28 DIAGNOSIS — I71019 Dissection of thoracic aorta, unspecified: Secondary | ICD-10-CM

## 2019-11-28 DIAGNOSIS — I712 Thoracic aortic aneurysm, without rupture, unspecified: Secondary | ICD-10-CM

## 2019-11-28 DIAGNOSIS — I10 Essential (primary) hypertension: Secondary | ICD-10-CM | POA: Diagnosis not present

## 2019-11-28 DIAGNOSIS — E1159 Type 2 diabetes mellitus with other circulatory complications: Secondary | ICD-10-CM

## 2019-11-28 DIAGNOSIS — I152 Hypertension secondary to endocrine disorders: Secondary | ICD-10-CM

## 2019-11-28 MED ORDER — HYDROCHLOROTHIAZIDE 12.5 MG PO TABS: 12.5000 mg | ORAL_TABLET | Freq: Every day | ORAL | 3 refills | Status: DC

## 2019-11-28 NOTE — Telephone Encounter (Signed)
lmom to r/s adv htn appt due to 15 min being set instead of 30 like its supposed to be.

## 2019-11-28 NOTE — Progress Notes (Signed)
Hypertension Clinic Initial Assessment:    Date:  11/28/2019   ID:  [name], [name] 1950/03/26, MRN [id number]  PCP:  Antonietta Jewel, MD  Cardiologist:  No primary care provider on file.  Nephrologist:  Referring MD: Antonietta Jewel, MD   CC: Hypertension  History of Present Illness:    [name] is a 69 y.o. female with a hx of hypertension, CKD 3, paroxysmal atrial fibrillation, mild aortic stenosis, diabetes, OSA, bradycardia status post pacemaker, and hypothyroidism here to establish care in the hypertension clinic.  She was first diagnosed with hypertension in her late 48s.  It was initially well-controlled.  Her BP has been harder to control since she has been taking care of her husband.  She was his primary caregiver and he passed 09/2019.  [name] hasn't been getting much exercise lately.  Since her husband died she is no longer active.  Prior to that she cared for him 13 years without any help or days off.  Now that he is gone she is feeling lost.  She struggles with sleeping alone at night or driving without him.  She has been finding it hard to sleep.  She also lost her oldest son to stomach cancer.  She previously cooked and now eats out more.  She is starting to try and limit her sodium intake.  She denies any exertional chest pain or shortness of breath.  She does not use any supplements or herbs.  She denies NSAID use.  She has been eating out a lot more now that her husband passed away.  She is trying to cut back on her tobacco use and is wearing a patch.  She is only smoked a few times since leaving the hospital.  She struggles with varicose veins that are painful at times.  She last saw Dr. Lovena Le on 9/21 and her blood pressure was elevated to 166/90.  He instructed her to work on sodium reduction and increased carvedilol to 25 mg twice daily.  She was hospitalized 08/2019 for descending thoracic aorta intramural hematoma dissection.  She underwent TEVAR on  09/09/2019.  Cardiology was consulted for management of hyper hypertension.  Her blood pressure was ultimately controlled on carvedilol and clonidine.  Renal artery Dopplers were without renal artery stenosis.  She saw her PCP last week and started back on hydralazine.  This seems to have helped.  She saw Dr. Trula Slade who recommended she be seen in [facility] .  Previous antihypertensives: Amlodipine Lisinopril Losartan   Past Medical History:  Diagnosis Date  . Anemia   . Anxiety   . Arthritis   . Atrial fib/flutter, transient   . COPD (chronic obstructive pulmonary disease) ([place])   . Depression   . Diabetes mellitus   . Diverticulosis   . DJD (degenerative joint disease)   . GERD (gastroesophageal reflux disease)   . Gout   . History of pneumonia   . Hypertension   . Hypothyroidism   . Internal hemorrhoids     Past Surgical History:  Procedure Laterality Date  . CATARACT EXTRACTION W/ INTRAOCULAR LENS IMPLANT Left   . ENDARTERECTOMY Left 09/09/2019   Procedure: LEFT ILIAC ENDARTERECTOMY;  Surgeon: Serafina Mitchell, MD;  Location: [facility];  Service: Vascular;  Laterality: Left;  . HERNIA REPAIR    . INSERTION OF ILIAC STENT Left 09/09/2019   Procedure: INSERTION OF LEFT ILIAC STENT;  Surgeon: Serafina Mitchell, MD;  Location: [facility];  Service: Vascular;  Laterality: Left;  . PACEMAKER INSERTION    . THORACIC AORTIC ENDOVASCULAR STENT GRAFT N/A 09/09/2019   Procedure: THORACIC AORTIC ENDOVASCULAR STENT GRAFT WITH LEFT ILIAC CONDUIT;  Surgeon: Serafina Mitchell, MD;  Location: [facility];  Service: Vascular;  Laterality: N/A;  . TUBAL LIGATION    . ULTRASOUND GUIDANCE FOR VASCULAR ACCESS Left 09/09/2019   Procedure: ULTRASOUND GUIDANCE FOR VASCULAR ACCESS;  Surgeon: Serafina Mitchell, MD;  Location: [facility];  Service: Vascular;  Laterality: Left;  Marland Kitchen VAGINAL HYSTERECTOMY      Current Medications: Current Meds  Medication Sig  . aspirin 81 MG tablet Take 81 mg by mouth every morning.     . carvedilol (COREG) 25 MG tablet Take 1 tablet (25 mg total) by mouth 2 (two) times daily with a meal.  . cholecalciferol (VITAMIN D) 1000 UNITS tablet Take 1,000 Units by mouth every morning.   . cloNIDine (CATAPRES - DOSED IN MG/24 HR) 0.2 mg/24hr patch Place 0.2 mg onto the skin in the morning and at bedtime. Take two tablets twice daily.  . cloNIDine (CATAPRES) 0.1 MG tablet Take 1 tablet (0.1 mg total) by mouth 2 (two) times daily.  Marland Kitchen gabapentin (NEURONTIN) 100 MG capsule Take 1 capsule (100 mg total) by mouth 3 (three) times daily.  . hydrALAZINE (APRESOLINE) 100 MG tablet Take 100 mg by mouth 3 (three) times daily.  Marland Kitchen levothyroxine (SYNTHROID) 25 MCG tablet Take 1 tablet (25 mcg total) by mouth daily.  Marland Kitchen lidocaine (LMX) 4 % cream Apply topically 4 (four) times daily as needed (left upper extremity pain).  . melatonin 3 MG TABS tablet Take 3 tablets (9 mg total) by mouth at bedtime.  . metFORMIN (GLUCOPHAGE) 500 MG tablet Take 500 mg by mouth 2 (two) times daily.  . Multiple Vitamin (MULTIVITAMIN) tablet Take 1 tablet by mouth every morning.   . nicotine (NICODERM CQ - DOSED IN MG/24 HR) 7 mg/24hr patch Place 1 patch (7 mg total) onto the skin daily.  . Oxycodone HCl 10 MG TABS Take 10 mg by mouth 4 (four) times daily as needed.  . pantoprazole (PROTONIX) 40 MG tablet Take 1 tablet (40 mg total) by mouth daily.  . polyethylene glycol (MIRALAX / GLYCOLAX) packet Take 17 g by mouth 2 (two) times daily.   . rosuvastatin (CRESTOR) 20 MG tablet Take 1 tablet (20 mg total) by mouth daily.  . sertraline (ZOLOFT) 100 MG tablet Take 50 mg by mouth at bedtime.      Allergies:   Amlodipine, Lisinopril, Losartan, Codeine phosphate, Morphine and related, Naproxen, Nsaids, Penicillins, Propoxyphene n-acetaminophen, Sulfamethoxazole-trimethoprim, and Flexeril [cyclobenzaprine]   Social History   Socioeconomic History  . Marital status: Widowed    Spouse name: Not on file  . Number of children: 2   . Years of education: Not on file  . Highest education level: Not on file  Occupational History  . Occupation: Disabled    Employer: UNEMPLOYED  Tobacco Use  . Smoking status: Current Some Day Smoker    Packs/day: 0.50    Years: 30.00    Pack years: 15.00    Types: Cigarettes  . Smokeless tobacco: Never Used  Vaping Use  . Vaping Use: Never used  Substance and Sexual Activity  . Alcohol use: No  . Drug use: No  . Sexual activity: Yes    Birth control/protection: Surgical  Other Topics Concern  . Not on file  Social History Narrative  . Not on file   Social Determinants  of Health   Financial Resource Strain: Medium Risk  . Difficulty of Paying Living Expenses: Somewhat hard  Food Insecurity: No Food Insecurity  . Worried About Charity fundraiser in the Last Year: Never true  . Ran Out of Food in the Last Year: Never true  Transportation Needs: No Transportation Needs  . Lack of Transportation (Medical): No  . Lack of Transportation (Non-Medical): No  Physical Activity: Unknown  . Days of Exercise per Week: Not on file  . Minutes of Exercise per Session: 20 min  Stress: Stress Concern Present  . Feeling of Stress : Rather much  Social Connections:   . Frequency of Communication with Friends and Family: Not on file  . Frequency of Social Gatherings with Friends and Family: Not on file  . Attends Religious Services: Not on file  . Active Member of Clubs or Organizations: Not on file  . Attends Archivist Meetings: Not on file  . Marital Status: Not on file     Family History: The patient's family history includes CAD in her maternal aunt and maternal uncle; Diabetes in her maternal aunt; Heart attack in her mother; Heart disease in her maternal uncle and mother; Hypertension in her mother; Kidney disease (age of onset: 66) in her mother; Prostate cancer in her father; Stomach cancer in her son; Stroke in her maternal aunt and maternal uncle. There is no  history of Colon cancer, Esophageal cancer, or Rectal cancer.  ROS:   Please see the history of present illness.    All other systems reviewed and are negative.  EKGs/Labs/Other Studies Reviewed:    EKG:  EKG is not ordered today.  The ekg ordered 09/03/19 demonstrates sinus rhythm.  Rate 70 bpm.   Recent Labs: 09/05/2019: TSH 0.111 09/15/2019: B Natriuretic Peptide 527.8 09/18/2019: ALT 10; Magnesium 2.0 09/19/2019: BUN 25; Creatinine, Ser 1.32; Hemoglobin 9.5; Platelets 478; Potassium 4.1; Sodium 136   Recent Lipid Panel    Component Value Date/Time   CHOL 158 08/09/2008 0859   TRIG 44 09/06/2019 0723   HDL 55.00 08/09/2008 0859   CHOLHDL 3 08/09/2008 0859   VLDL 13.8 08/09/2008 0859   LDLCALC 89 08/09/2008 0859   LDLDIRECT 128.9 05/21/2006 0948    Physical Exam:    VS:  BP (!) 150/120   Ht 5\' 5"  (1.651 m)   Wt 160 lb (72.6 kg)   BMI 26.63 kg/m  , BMI Body mass index is 26.63 kg/m. GENERAL:  Well appearing HEENT: Pupils equal round and reactive, fundi not visualized, oral mucosa unremarkable NECK:  No jugular venous distention, waveform within normal limits, carotid upstroke brisk and symmetric, no bruits LUNGS:  Clear to auscultation bilaterally HEART:  RRR.  PMI not displaced or sustained,S1 and S2 within normal limits, no S3, no S4, no clicks, no rubs, no murmurs ABD:  Flat, positive bowel sounds normal in frequency in pitch, no bruits, no rebound, no guarding, no midline pulsatile mass, no hepatomegaly, no splenomegaly EXT:  2 plus pulses throughout, no edema, no cyanosis no clubbing SKIN:  No rashes no nodules NEURO:  Cranial nerves II through XII grossly intact, motor grossly intact throughout PSYCH:  Cognitively intact, oriented to person place and time   ASSESSMENT:    1. Essential hypertension   2. Thoracic aortic aneurysm without rupture (H[facility])   3. Paroxysmal atrial fibrillation ([place])   4. Dissection of thoracic aorta ([place])   5. Hypertension associated  with diabetes ([place])     PLAN:    #  Essential hypertension:  # Hypothyroidism: BP remains poorly controlled.  Her BP should be <130/80.  She has ACE-I/ARB allergy.  We will resume HCTZ 12.5 mg daily.  In a week we will check a basic metabolic panel.  At that time we will also check renin and aldosterone.  Have asked her to get this checked in the morning.  Given that her thyroid is abnormal in the hospital and levothyroxine was adjusted, we will also recheck TSH and free T4 at that time.  Based on lab findings consider adding spironolactone.  She would like to do the prep program through the [facility] but lives too far away.  We will check and see if there are any options for her in [place].  Continue carvedilol, clonidine, hydralazine.   Secondary Causes of Hypertension  Medications/Herbal: OCP, steroids, stimulants, antidepressants, weight loss medication, immune suppressants, NSAIDs, sympathomimetics, alcohol-none, caffeine- rare, licorice, ginseng, St. John's wort, chemo, tobacco Sleep Apnea: no snoring.  Negative for OSA Renal artery stenosis: Normal by CT 09/2019 Hyperaldosteronism:  Check renin/aldo.  No adenomas 09/2019 on CT. Hyper/hypothyroidism: Thyroid abnormal 08/2019.  Repeat TSH Pheochromocytoma: (testing not indicated)  Cushing's syndrome: (testing not indicated)  Coarctation of the aorta: BP symmetric 11/2019  # PAF:  Continue carvedilol.  Maintaining sinus rhythm.  Episode was thought to be sporadic and she is not on maintenance anticoagulation.  # Aortic aneurysm/dissection/intramural hematoma: S/p TEVAR.  Follows with Dr. Trula Slade.  BP management as above.  Continue statin.  #  Currently in sinus rhythm.  On carvedilol   Disposition:    FU with MD/PharmD in 1 month    Medication Adjustments/Labs and Tests Ordered: Current medicines are reviewed at length with the patient today.  Concerns regarding medicines are outlined above.  Orders Placed This Encounter    Procedures  . T4, free  . TSH  . Basic metabolic panel  . Aldosterone + renin activity w/ ratio   Meds ordered this encounter  Medications  . hydrochlorothiazide (HYDRODIURIL) 12.5 MG tablet    Sig: Take 1 tablet (12.5 mg total) by mouth daily.    Dispense:  90 tablet    Refill:  3     Signed, Skeet Latch, MD  11/28/2019 4:57 PM    [facility]

## 2019-11-28 NOTE — Patient Instructions (Addendum)
Medication Instructions:  START HYDROCHLOROTHIAZIDE 12.5 MG DAILY    Labwork: 1 WEEK IN THE MORNING BMET/RENIN/ALDOSTERONE/FT4/TSH AT THE [facility] OFFICE    Testing/Procedures: NONE    Follow-Up: 1 MONTH 12/27/2019 AT 11:00 AM WITH PHARM D  Special Instructions:   MONITOR YOUR BLOOD PRESSURE TWICE A DAY, LOG IN THE BOOK PROVIDED. BRING THE BOOK AND YOUR BLOOD PRESSURE MACHINE TO YOUR FOLLOW UP IN 1 MONTH   DASH Eating Plan DASH stands for "Dietary Approaches to Stop Hypertension." The DASH eating plan is a healthy eating plan that has been shown to reduce high blood pressure (hypertension). It may also reduce your risk for type 2 diabetes, heart disease, and stroke. The DASH eating plan may also help with weight loss. What are tips for following this plan?  General guidelines  Avoid eating more than 2,300 mg (milligrams) of salt (sodium) a day. If you have hypertension, you may need to reduce your sodium intake to 1,500 mg a day.  Limit alcohol intake to no more than 1 drink a day for nonpregnant women and 2 drinks a day for men. One drink equals 12 oz of beer, 5 oz of wine, or 1 oz of hard liquor.  Work with your health care provider to maintain a healthy body weight or to lose weight. Ask what an ideal weight is for you.  Get at least 30 minutes of exercise that causes your heart to beat faster (aerobic exercise) most days of the week. Activities may include walking, swimming, or biking.  Work with your health care provider or diet and nutrition specialist (dietitian) to adjust your eating plan to your individual calorie needs. Reading food labels   Check food labels for the amount of sodium per serving. Choose foods with less than 5 percent of the Daily Value of sodium. Generally, foods with less than 300 mg of sodium per serving fit into this eating plan.  To find whole grains, look for the word "whole" as the first word in the ingredient list. Shopping  Buy products  labeled as "low-sodium" or "no salt added."  Buy fresh foods. Avoid canned foods and premade or frozen meals. Cooking  Avoid adding salt when cooking. Use salt-free seasonings or herbs instead of table salt or sea salt. Check with your health care provider or pharmacist before using salt substitutes.  Do not fry foods. Cook foods using healthy methods such as baking, boiling, grilling, and broiling instead.  Cook with heart-healthy oils, such as olive, canola, soybean, or sunflower oil. Meal planning  Eat a balanced diet that includes: ? 5 or more servings of fruits and vegetables each day. At each meal, try to fill half of your plate with fruits and vegetables. ? Up to 6-8 servings of whole grains each day. ? Less than 6 oz of lean meat, poultry, or fish each day. A 3-oz serving of meat is about the same size as a deck of cards. One egg equals 1 oz. ? 2 servings of low-fat dairy each day. ? A serving of nuts, seeds, or beans 5 times each week. ? Heart-healthy fats. Healthy fats called Omega-3 fatty acids are found in foods such as flaxseeds and coldwater fish, like sardines, salmon, and mackerel.  Limit how much you eat of the following: ? Canned or prepackaged foods. ? Food that is high in trans fat, such as fried foods. ? Food that is high in saturated fat, such as fatty meat. ? Sweets, desserts, sugary drinks, and other foods with  added sugar. ? Full-fat dairy products.  Do not salt foods before eating.  Try to eat at least 2 vegetarian meals each week.  Eat more home-cooked food and less restaurant, buffet, and fast food.  When eating at a restaurant, ask that your food be prepared with less salt or no salt, if possible. What foods are recommended? The items listed may not be a complete list. Talk with your dietitian about what dietary choices are best for you. Grains Whole-grain or whole-wheat bread. Whole-grain or whole-wheat pasta. Brown rice. Modena Morrow. Bulgur.  Whole-grain and low-sodium cereals. Pita bread. Low-fat, low-sodium crackers. Whole-wheat flour tortillas. Vegetables Fresh or frozen vegetables (raw, steamed, roasted, or grilled). Low-sodium or reduced-sodium tomato and vegetable juice. Low-sodium or reduced-sodium tomato sauce and tomato paste. Low-sodium or reduced-sodium canned vegetables. Fruits All fresh, dried, or frozen fruit. Canned fruit in natural juice (without added sugar). Meat and other protein foods Skinless chicken or [place]. Ground chicken or [place]. Pork with fat trimmed off. Fish and seafood. Egg whites. Dried beans, peas, or lentils. Unsalted nuts, nut butters, and seeds. Unsalted canned beans. Lean cuts of beef with fat trimmed off. Low-sodium, lean deli meat. Dairy Low-fat (1%) or fat-free (skim) milk. Fat-free, low-fat, or reduced-fat cheeses. Nonfat, low-sodium ricotta or cottage cheese. Low-fat or nonfat yogurt. Low-fat, low-sodium cheese. Fats and oils Soft margarine without trans fats. Vegetable oil. Low-fat, reduced-fat, or light mayonnaise and salad dressings (reduced-sodium). Canola, safflower, olive, soybean, and sunflower oils. Avocado. Seasoning and other foods Herbs. Spices. Seasoning mixes without salt. Unsalted popcorn and pretzels. Fat-free sweets. What foods are not recommended? The items listed may not be a complete list. Talk with your dietitian about what dietary choices are best for you. Grains Baked goods made with fat, such as croissants, muffins, or some breads. Dry pasta or rice meal packs. Vegetables Creamed or fried vegetables. Vegetables in a cheese sauce. Regular canned vegetables (not low-sodium or reduced-sodium). Regular canned tomato sauce and paste (not low-sodium or reduced-sodium). Regular tomato and vegetable juice (not low-sodium or reduced-sodium). Angie Fava. Olives. Fruits Canned fruit in a light or heavy syrup. Fried fruit. Fruit in cream or butter sauce. Meat and other protein  foods Fatty cuts of meat. Ribs. Fried meat. Berniece Salines. Sausage. Bologna and other processed lunch meats. Salami. Fatback. Hotdogs. Bratwurst. Salted nuts and seeds. Canned beans with added salt. Canned or smoked fish. Whole eggs or egg yolks. Chicken or [place] with skin. Dairy Whole or 2% milk, cream, and half-and-half. Whole or full-fat cream cheese. Whole-fat or sweetened yogurt. Full-fat cheese. Nondairy creamers. Whipped toppings. Processed cheese and cheese spreads. Fats and oils Butter. Stick margarine. Lard. Shortening. Ghee. Bacon fat. Tropical oils, such as coconut, palm kernel, or palm oil. Seasoning and other foods Salted popcorn and pretzels. Onion salt, garlic salt, seasoned salt, table salt, and sea salt. Worcestershire sauce. Tartar sauce. Barbecue sauce. Teriyaki sauce. Soy sauce, including reduced-sodium. Steak sauce. Canned and packaged gravies. Fish sauce. Oyster sauce. Cocktail sauce. Horseradish that you find on the shelf. Ketchup. Mustard. Meat flavorings and tenderizers. Bouillon cubes. Hot sauce and Tabasco sauce. Premade or packaged marinades. Premade or packaged taco seasonings. Relishes. Regular salad dressings. Where to find more information:  National Heart, Lung, and [place]: https://wilson-eaton.com/  American Heart Association: www.heart.org Summary  The DASH eating plan is a healthy eating plan that has been shown to reduce high blood pressure (hypertension). It may also reduce your risk for type 2 diabetes, heart disease, and stroke.  With the DASH  eating plan, you should limit salt (sodium) intake to 2,300 mg a day. If you have hypertension, you may need to reduce your sodium intake to 1,500 mg a day.  When on the DASH eating plan, aim to eat more fresh fruits and vegetables, whole grains, lean proteins, low-fat dairy, and heart-healthy fats.  Work with your health care provider or diet and nutrition specialist (dietitian) to adjust your eating plan to your  individual calorie needs. This information is not intended to replace advice given to you by your health care provider. Make sure you discuss any questions you have with your health care provider. Document Released: 12/26/2010 Document Revised: 12/19/2016 Document Reviewed: 12/31/2015 Elsevier Patient Education  2020 [employer].

## 2019-12-06 ENCOUNTER — Emergency Department (HOSPITAL_COMMUNITY)
Admission: EM | Admit: 2019-12-06 | Discharge: 2019-12-07 | Disposition: A | Discharge status: Home / Self Care | Payer: Medicare Other | Attending: Emergency Medicine | Admitting: Emergency Medicine

## 2019-12-06 ENCOUNTER — Emergency Department (HOSPITAL_COMMUNITY): Payer: Medicare Other

## 2019-12-06 ENCOUNTER — Other Ambulatory Visit: Payer: Self-pay

## 2019-12-06 ENCOUNTER — Encounter (HOSPITAL_COMMUNITY): Payer: Self-pay | Admitting: Emergency Medicine

## 2019-12-06 DIAGNOSIS — M79605 Pain in left leg: Secondary | ICD-10-CM | POA: Insufficient documentation

## 2019-12-06 DIAGNOSIS — Z79899 Other long term (current) drug therapy: Secondary | ICD-10-CM | POA: Diagnosis not present

## 2019-12-06 DIAGNOSIS — Z7984 Long term (current) use of oral hypoglycemic drugs: Secondary | ICD-10-CM | POA: Insufficient documentation

## 2019-12-06 DIAGNOSIS — M79604 Pain in right leg: Secondary | ICD-10-CM

## 2019-12-06 DIAGNOSIS — R519 Headache, unspecified: Secondary | ICD-10-CM | POA: Diagnosis not present

## 2019-12-06 DIAGNOSIS — I10 Essential (primary) hypertension: Secondary | ICD-10-CM | POA: Insufficient documentation

## 2019-12-06 DIAGNOSIS — Z950 Presence of cardiac pacemaker: Secondary | ICD-10-CM | POA: Diagnosis not present

## 2019-12-06 DIAGNOSIS — E039 Hypothyroidism, unspecified: Secondary | ICD-10-CM | POA: Insufficient documentation

## 2019-12-06 DIAGNOSIS — E1169 Type 2 diabetes mellitus with other specified complication: Secondary | ICD-10-CM | POA: Diagnosis not present

## 2019-12-06 DIAGNOSIS — Z7982 Long term (current) use of aspirin: Secondary | ICD-10-CM | POA: Diagnosis not present

## 2019-12-06 DIAGNOSIS — J449 Chronic obstructive pulmonary disease, unspecified: Secondary | ICD-10-CM | POA: Diagnosis not present

## 2019-12-06 DIAGNOSIS — Z20822 Contact with and (suspected) exposure to covid-19: Secondary | ICD-10-CM | POA: Diagnosis not present

## 2019-12-06 DIAGNOSIS — F17210 Nicotine dependence, cigarettes, uncomplicated: Secondary | ICD-10-CM | POA: Diagnosis not present

## 2019-12-06 DIAGNOSIS — J45909 Unspecified asthma, uncomplicated: Secondary | ICD-10-CM | POA: Insufficient documentation

## 2019-12-06 LAB — CBC WITH DIFFERENTIAL/PLATELET
Abs Immature Granulocytes: 0.03 10*3/uL (ref 0.00–0.07)
Basophils Absolute: 0 10*3/uL (ref 0.0–0.1)
Basophils Relative: 1 %
Eosinophils Absolute: 0.1 10*3/uL (ref 0.0–0.5)
Eosinophils Relative: 2 %
HCT: 40.9 % (ref 36.0–46.0)
Hemoglobin: 12.5 g/dL (ref 12.0–15.0)
Immature Granulocytes: 0 %
Lymphocytes Relative: 23 %
Lymphs Abs: 1.6 10*3/uL (ref 0.7–4.0)
MCH: 26.4 pg (ref 26.0–34.0)
MCHC: 30.6 g/dL (ref 30.0–36.0)
MCV: 86.3 fL (ref 80.0–100.0)
Monocytes Absolute: 0.7 10*3/uL (ref 0.1–1.0)
Monocytes Relative: 10 %
Neutro Abs: 4.5 10*3/uL (ref 1.7–7.7)
Neutrophils Relative %: 64 %
Platelets: 217 10*3/uL (ref 150–400)
RBC: 4.74 MIL/uL (ref 3.87–5.11)
RDW: 25 % — ABNORMAL HIGH (ref 11.5–15.5)
WBC: 7 10*3/uL (ref 4.0–10.5)
nRBC: 0 % (ref 0.0–0.2)

## 2019-12-06 LAB — BASIC METABOLIC PANEL
Anion gap: 11 (ref 5–15)
BUN: 25 mg/dL — ABNORMAL HIGH (ref 8–23)
CO2: 28 mmol/L (ref 22–32)
Calcium: 9.7 mg/dL (ref 8.9–10.3)
Chloride: 100 mmol/L (ref 98–111)
Creatinine, Ser: 1.28 mg/dL — ABNORMAL HIGH (ref 0.44–1.00)
GFR, Estimated: 45 mL/min — ABNORMAL LOW (ref >60)
Glucose, Bld: 144 mg/dL — ABNORMAL HIGH (ref 70–99)
Potassium: 3.5 mmol/L (ref 3.5–5.1)
Sodium: 139 mmol/L (ref 135–145)

## 2019-12-06 LAB — CBG MONITORING, ED: Glucose-Capillary: 132 mg/dL — ABNORMAL HIGH (ref 70–99)

## 2019-12-06 LAB — TROPONIN I (HIGH SENSITIVITY): Troponin I (High Sensitivity): 18 ng/L — ABNORMAL HIGH (ref <18)

## 2019-12-06 LAB — RESPIRATORY PANEL BY RT PCR (FLU A&B, COVID)
Influenza A by PCR: NEGATIVE
Influenza B by PCR: NEGATIVE
SARS Coronavirus 2 by RT PCR: NEGATIVE

## 2019-12-06 MED ORDER — HYDRALAZINE HCL 25 MG PO TABS
100.0000 mg | ORAL_TABLET | Freq: Once | ORAL | Status: AC
  Administered 2019-12-06: 100 mg via ORAL
  Filled 2019-12-06: qty 4

## 2019-12-06 MED ORDER — CLONIDINE HCL 0.2 MG PO TABS
0.2000 mg | ORAL_TABLET | Freq: Once | ORAL | Status: AC
  Administered 2019-12-06: 0.2 mg via ORAL
  Filled 2019-12-06: qty 1

## 2019-12-06 MED ORDER — FENTANYL CITRATE (PF) 100 MCG/2ML IJ SOLN
50.0000 ug | Freq: Once | INTRAMUSCULAR | Status: AC
  Administered 2019-12-06: 50 ug via INTRAVENOUS
  Filled 2019-12-06: qty 2

## 2019-12-06 MED ORDER — IOHEXOL 350 MG/ML SOLN
100.0000 mL | Freq: Once | INTRAVENOUS | Status: AC | PRN
  Administered 2019-12-06: 100 mL via INTRAVENOUS

## 2019-12-06 NOTE — ED Triage Notes (Signed)
Pt c/o bilateral leg pain from the hips to feet that began yesterday.

## 2019-12-06 NOTE — ED Provider Notes (Signed)
[facility] EMERGENCY DEPARTMENT Provider Note   CSN: [id number] Arrival date & time: 12/06/19  1459     History Chief Complaint  Patient presents with  . Leg Pain    [name] is a 69 y.o. female with PMH of hypothyroidism, type 2 diabetes, gout, depression, OSA, resistant hypertension, asthma, degenerative joint disease, paroxysmal A. fib not on anticoagulation, hypokalemia, history of thoracic aortic dissection status post TEVAR in August 2021 presenting with severe bilateral LE pain extending from hips to ankles. She woke up yesterday morning and when she went to stand she felt the pain and weakness in her legs. She called her PCP and was instructed to come to the ED. Given her pain and weakness she couldn't make it to the ED until today. Describes the pain as pressure in her hips/thighs and shooting pain down her leg. She endorses thigh pain and calf pain. Denies trauma or falls. Endorses history of sciatica which she notes this is similar but much more severe. Denies any LE swelling.  She also notes a chest pressure in middle/lower chest that was severe earlier today but has improved. She notes that it feels similar to her prior pain when she had a dissection. She endorses a mild frontal headache as well this evening. Denies any SOB, abdominal pain.  Patient also noted to have extremely elevated BP's in 202/106. Notes she has not taken her afternoon or evening doses of her blood pressure medications.    Past Medical History:  Diagnosis Date  . Anemia   . Anxiety   . Arthritis   . Atrial fib/flutter, transient   . COPD (chronic obstructive pulmonary disease) ([place])   . Depression   . Diabetes mellitus   . Diverticulosis   . DJD (degenerative joint disease)   . GERD (gastroesophageal reflux disease)   . Gout   . History of pneumonia   . Hypertension   . Hypothyroidism   . Internal hemorrhoids     Patient Active Problem List   Diagnosis Date Noted  . Encounter  for central line placement   . Hypotension due to drugs   . Hypoxia   . Dissection of thoracic aorta ([place])   . Intramural aortic hematoma ([place]) 08/28/2019  . Aortic aneurysm ([place]) 08/28/2019  . Hypokalemia 05/06/2017  . Abdominal pain 05/06/2017  . Atrial fibrillation ([place]) 03/21/2014  . Enlarged lymph node 12/29/2011  . OTHER&UNSPECIFIED DISEASES THE ORAL SOFT TISSUES 08/17/2008  . GOUT 07/10/2008  . ANKLE PAIN, RIGHT 07/10/2008  . ASTHMATIC BRONCHITIS, ACUTE 05/10/2008  . ABDOMINAL PAIN, UNSPECIFIED SITE 02/16/2008  . KNEE PAIN, BILATERAL 06/17/2007  . DEPRESSIVE DISORDER 12/25/2006  . Pain in limb 12/25/2006  . Lipoma of unspecified site 09/17/2006  . Hypothyroidism 09/17/2006  . Type 2 diabetes mellitus with hyperlipidemia ([place]) 09/17/2006  . OBSTRUCTIVE SLEEP APNEA 09/17/2006  . Hypertension associated with diabetes ([place]) 09/17/2006  . BRADYCARDIA, CHRONIC 09/17/2006  . HERNIA, SITE NOS W/O OBSTRUCTION/GANGRENE 09/17/2006  . DEGENERATIVE JOINT DISEASE, HANDS 09/17/2006  . 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 CATARACT EXTRACTION W/ INTRAOCULAR LENS IMPLANT Left   . ENDARTERECTOMY Left 09/09/2019   Procedure: LEFT ILIAC ENDARTERECTOMY;  Surgeon: Serafina Mitchell, MD;  Location: [place];  Service: Vascular;  Laterality: Left;  . HERNIA REPAIR    . INSERTION OF ILIAC STENT Left 09/09/2019   Procedure: INSERTION OF LEFT ILIAC STENT;  Surgeon: Serafina Mitchell, MD;  Location: [place];  Service:  Vascular;  Laterality: Left;  . PACEMAKER INSERTION    . THORACIC AORTIC ENDOVASCULAR STENT GRAFT N/A 09/09/2019   Procedure: THORACIC AORTIC ENDOVASCULAR STENT GRAFT WITH LEFT ILIAC CONDUIT;  Surgeon: Serafina Mitchell, MD;  Location: [facility];  Service: Vascular;  Laterality: N/A;  . TUBAL LIGATION    . ULTRASOUND GUIDANCE FOR VASCULAR ACCESS Left 09/09/2019   Procedure: ULTRASOUND GUIDANCE FOR VASCULAR ACCESS;  Surgeon: Serafina Mitchell, MD;   Location: [place];  Service: Vascular;  Laterality: Left;  Marland Kitchen VAGINAL HYSTERECTOMY       OB History    Gravida  3   Para      Term      Preterm      AB  1   Living  2     SAB      TAB  1   Ectopic      Multiple      Live Births              Family History  Problem Relation Age of Onset  . Prostate cancer Father   . Hypertension Mother   . Heart attack Mother   . Heart disease Mother   . Kidney disease Mother 35  . Stomach cancer Son   . Diabetes Maternal Aunt   . CAD Maternal Aunt   . Stroke Maternal Aunt   . Heart disease Maternal Uncle   . CAD Maternal Uncle   . Stroke Maternal Uncle   . Colon cancer Neg Hx   . Esophageal cancer Neg Hx   . Rectal cancer Neg Hx     Social History   Tobacco Use  . Smoking status: Current Some Day Smoker    Packs/day: 0.50    Years: 30.00    Pack years: 15.00    Types: Cigarettes  . Smokeless tobacco: Never Used  Vaping Use  . Vaping Use: Never used  Substance Use Topics  . Alcohol use: No  . Drug use: No    Home Medications Prior to Admission medications   Medication Sig Start Date End Date Taking? Authorizing Provider  aspirin 81 MG tablet Take 81 mg by mouth every morning.     [provider]  carvedilol (COREG) 25 MG tablet Take 1 tablet (25 mg total) by mouth 2 (two) times daily with a meal. 10/11/19   Evans Lance, MD  cholecalciferol (VITAMIN D) 1000 UNITS tablet Take 1,000 Units by mouth every morning.     [provider]  cloNIDine (CATAPRES - DOSED IN MG/24 HR) 0.2 mg/24hr patch Place 0.2 mg onto the skin in the morning and at bedtime. Take two tablets twice daily.    [provider]  cloNIDine (CATAPRES) 0.1 MG tablet Take 1 tablet (0.1 mg total) by mouth 2 (two) times daily. 09/19/19   Pokhrel, Corrie Mckusick, MD  gabapentin (NEURONTIN) 100 MG capsule Take 1 capsule (100 mg total) by mouth 3 (three) times daily. 09/19/19   Pokhrel, Corrie Mckusick, MD  hydrALAZINE (APRESOLINE) 100 MG  tablet Take 100 mg by mouth 3 (three) times daily. 09/29/19   [provider]  hydrochlorothiazide (HYDRODIURIL) 12.5 MG tablet Take 1 tablet (12.5 mg total) by mouth daily. 11/28/19   Skeet Latch, MD  levothyroxine (SYNTHROID) 25 MCG tablet Take 1 tablet (25 mcg total) by mouth daily. 09/19/19   Pokhrel, Corrie Mckusick, MD  lidocaine (LMX) 4 % cream Apply topically 4 (four) times daily as needed (left upper extremity pain). 09/19/19   Pokhrel, Laxman,  MD  melatonin 3 MG TABS tablet Take 3 tablets (9 mg total) by mouth at bedtime. 09/19/19   Pokhrel, Corrie Mckusick, MD  metFORMIN (GLUCOPHAGE) 500 MG tablet Take 500 mg by mouth 2 (two) times daily.    [provider]  Multiple Vitamin (MULTIVITAMIN) tablet Take 1 tablet by mouth every morning.     [provider]  nicotine (NICODERM CQ - DOSED IN MG/24 HR) 7 mg/24hr patch Place 1 patch (7 mg total) onto the skin daily. 09/20/19   Pokhrel, Corrie Mckusick, MD  Oxycodone HCl 10 MG TABS Take 10 mg by mouth 4 (four) times daily as needed. 08/08/19   [provider]  pantoprazole (PROTONIX) 40 MG tablet Take 1 tablet (40 mg total) by mouth daily. 09/20/19   Pokhrel, Corrie Mckusick, MD  polyethylene glycol (MIRALAX / GLYCOLAX) packet Take 17 g by mouth 2 (two) times daily.     [provider]  rosuvastatin (CRESTOR) 20 MG tablet Take 1 tablet (20 mg total) by mouth daily. 09/20/19   Pokhrel, Corrie Mckusick, MD  sertraline (ZOLOFT) 100 MG tablet Take 50 mg by mouth at bedtime.     [provider]    Allergies    Amlodipine, Lisinopril, Losartan, Codeine phosphate, Morphine and related, Naproxen, Nsaids, Penicillins, Propoxyphene n-acetaminophen, Sulfamethoxazole-trimethoprim, and Flexeril [cyclobenzaprine]  Review of Systems   Review of Systems  Constitutional: Negative for chills and fever.  Respiratory: Positive for chest tightness. Negative for cough and shortness of breath.   Cardiovascular: Negative for chest pain and leg swelling.   Gastrointestinal: Negative for abdominal pain.  Musculoskeletal:       Bilateral lower extremity pain and weakness   Neurological: Positive for weakness and headaches.    Physical Exam Updated Vital Signs BP (!) 202/106   Pulse 70   Temp 98.9 F (37.2 C) (Oral)   Resp (!) 9   Ht 5\' 5"  (1.651 m)   Wt 72.6 kg   SpO2 95%   BMI 26.63 kg/m   Physical Exam Constitutional:      General: She is not in acute distress.    Appearance: Normal appearance. She is normal weight. She is not ill-appearing or toxic-appearing.  HENT:     Head: Normocephalic and atraumatic.  Cardiovascular:     Rate and Rhythm: Normal rate.  Pulmonary:     Effort: Pulmonary effort is normal.  Musculoskeletal:        General: Tenderness (bilateral calves and thighs) present. No swelling, deformity or signs of injury.     Right lower leg: No edema.     Left lower leg: No edema.     Comments: See below  Skin:    General: Skin is warm and dry.     Findings: No erythema.  Neurological:     Mental Status: She is alert.    Bilateral Hip:  - Inspection: No gross deformity, no swelling, erythema, or ecchymosis - Palpation: TTP along anterior and posterior thigh and calves bilaterally, some pain over greater trochanter  - ROM: Normal range of motion on Flexion, extension, abduction, internal and external rotation but pain with all ROM - Strength: Symmetric but mildly decreased strength bilaterally  - Neuro/vasc: NV intact distally - Special Tests: Pain with piriformis stretch bilaterally, pain and reproduction of pain with SLR, some pain with FABER/FADIR  Bilateral Knee: - Inspection: no gross deformity. No swelling/effusion, erythema or bruising. Skin intact - Palpation: no TTP - ROM: full active ROM with flexion and extension in knee - Strength: midlly  decreased strength in LE bilaterally, symmetric  - Neuro/vasc: NV intact - Special Tests:  MSK: calves tender to palpation bilaterally  CV: 2+ pedal  and posterior tibial pulses bilaterally   ED Results / Procedures / Treatments   Labs (all labs ordered are listed, but only abnormal results are displayed) Labs Reviewed  CBG MONITORING, ED - Abnormal; Notable for the following components:      Result Value   Glucose-Capillary 132 (*)    All other components within normal limits  CBC WITH DIFFERENTIAL/PLATELET  BASIC METABOLIC PANEL  TROPONIN I (HIGH SENSITIVITY)    EKG None  Radiology No results found.  Procedures Procedures (including critical care time)  Medications Ordered in ED Medications - No data to display  ED Course  MDM Rules/Calculators/A&P I have reviewed the triage vital signs and the nursing notes.  Pertinent labs & imaging results that were available during my care of the patient were reviewed by me and considered in my medical decision making (see chart for details).  [name] is a 69 y.o. female with PMH of hypothyroidism, type 2 diabetes, gout, depression, OSA, resistant hypertension, asthma, degenerative joint disease, paroxysmal A. fib not on anticoagulation, hypokalemia, history of thoracic aortic dissection status post TEVAR in August 2021 presenting with acute severe bilateral LE pain extending from hips to ankles with weakness, chest tightness, and severely elevated blood pressures.  Regarding her lower extremity pain. Although suspect this is MSK in origin, the bilateral presentation is abnormal and raises some concern. She does have a history of chronic persistent sciatica which is reproducible on my exam, however such acutely worsening presentation plus endorsement of chest pressure similar to prior episode is concerning repeat dissection. Lack of trauma is reassuring regarding bony abnormality thus will hold off on x-rays. Unlikely bilateral PE, particularly given lack of swelling or other RF's.  At this time, will obtain CTA chest/abdomen/pelvis to further evaluate. Will given dose of  Fentanyl 27mcg for pain.   Patient also has a history of resistant HTN and presents with severely elevated blood pressure in setting of missed home doses of blood pressure medications including Hydralazine 100mg  TID, Carvedilol 12.5mg  BID, and Clonidine 0.2mg  TID. Will provide dose of Clonidine 0.2mg  and Hydralazine 100mg  and reassess.  Reassuring CBC normal with stable Hgb of 12.5. Remaining labs pending.  Will sign out to oncoming provider for dispo planning pending above results. If CTA negative and improvement in pain with Fentanyl, patient would need to be ambulated to ensure ability to discharge home safely. May benefit from short course of steroids for possible severe sciatica. Can consider imaging of lumbar spine if felt indicated.     Final Clinical Impression(s) / ED Diagnoses Final diagnoses:  None    Rx / DC Orders ED Discharge Orders    None       Danna Hefty, DO 12/06/19 2311    Elnora Morrison, MD 12/10/19 2341

## 2019-12-07 LAB — TROPONIN I (HIGH SENSITIVITY): Troponin I (High Sensitivity): 19 ng/L — ABNORMAL HIGH (ref <18)

## 2019-12-07 MED ORDER — HYDRALAZINE HCL 25 MG PO TABS
100.0000 mg | ORAL_TABLET | Freq: Once | ORAL | Status: AC
  Administered 2019-12-07: 100 mg via ORAL
  Filled 2019-12-07: qty 4

## 2019-12-07 MED ORDER — CLONIDINE HCL 0.2 MG PO TABS
0.2000 mg | ORAL_TABLET | Freq: Once | ORAL | Status: AC
  Administered 2019-12-07: 0.2 mg via ORAL
  Filled 2019-12-07: qty 1

## 2019-12-07 NOTE — ED Notes (Signed)
Pt ambulated to BR with stand by assist.

## 2019-12-07 NOTE — ED Provider Notes (Signed)
Patient left at change of shift to get results of her CTA of her lower extremities. I was getting ready to go see the patient at 2:00 AM however she was being ambulated to the bathroom by nursing staff with minimal assistance.  Patient states when she stands up yesterday and today her legs hurt in her thighs and knees and then her knees buckle.  She states she has a walker she can use at home.  She states she had something similar in the hospital after her surgery and she did do physical therapy which helped for a while.  We discussed following back up with Dr. Oneida Alar about her continuing problems of her legs.  She states that she had numbness right after her surgery and "my legs have not been the same since".  Review the [place] shows patient gets #80 oxycodone 10 mg every 20 days.  They were last filled November 1.  Patient drove herself to the ED.  CT Angio Chest/Abd/Pel for Dissection W and/or W/WO  Result Date: 12/07/2019 CLINICAL DATA:  69 year old female with thoracic aorta disease status post prior repair presenting with chest pain and bilateral lower extremity pain. History of dissection in August 2021. EXAM: CT ANGIOGRAPHY CHEST, ABDOMEN AND PELVIS TECHNIQUE: Non-contrast CT of the chest was initially obtained. Multidetector CT imaging through the chest, abdomen and pelvis was performed using the standard protocol during bolus administration of intravenous contrast. Multiplanar reconstructed images and MIPs were obtained and reviewed to evaluate the vascular anatomy. CONTRAST:  18mL OMNIPAQUE IOHEXOL 350 MG/ML SOLN COMPARISON:  CT dated 10/14/2019. FINDINGS: CTA CHEST FINDINGS Cardiovascular: There is mild cardiomegaly. No pericardial effusion. There is coronary vascular calcification and cardiac pacemaker. Status post endovascular stent graft repair of the distal aortic arch and descending thoracic aorta. The stent graft appears patent. No extraluminal contrast noted. No  periaortic collection or contrast extravasation. The origins of the great vessels of the aortic arch appear patent. No pulmonary artery embolus identified. Mediastinum/Nodes: Mildly enlarged right hilar lymph node measures 14 mm in short axis. This is similar to prior CT. The esophagus is grossly unremarkable. No mediastinal fluid collection. Lungs/Pleura: There are bibasilar linear atelectasis/scarring. There is background of mild emphysema. No focal consolidation, pleural effusion, pneumothorax. The central airways are patent. Musculoskeletal: Degenerative changes of the spine. No acute osseous pathology. There is diffuse subcutaneous edema and anasarca. Review of the MIP images confirms the above findings. CTA ABDOMEN AND PELVIS FINDINGS VASCULAR Aorta: Advanced aortoiliac atherosclerotic disease. There is a 2.1 cm ectasia of infrarenal abdominal aorta. No dissection or aneurysm. No periaortic fluid collection Celiac: Atherosclerotic calcification of the origin of the celiac axis. The celiac artery and its major branches are patent. SMA: Patent without evidence of aneurysm, dissection, vasculitis or significant stenosis. Renals: Atherosclerotic calcification of the renal artery ostia. The renal arteries are patent. IMA: Patent without evidence of aneurysm, dissection, vasculitis or significant stenosis. Inflow: Advanced atherosclerotic calcification of the iliac arteries. There is a left external iliac artery stent. The stent appears patent. There is again a 12 mm contrast collection outside of the lumen of the stent as seen on the prior CT which may represent a pseudoaneurysm there is a 3.1 x 2.2 cm low attenuating collection along the superior aspect of this contrast collection (141/5 and 59/9). This was also present on the prior CT of 10/14/2019 and may represent a thrombosed portion of the pseudoaneurysm, or a seroma. Veins: No obvious venous abnormality within the limitations of this arterial  phase study.  Review of the MIP images confirms the above findings. NON-VASCULAR No intra-abdominal free air or free fluid. Hepatobiliary: The liver is grossly unremarkable. No intrahepatic biliary dilatation. The gallbladder is unremarkable. Pancreas: Unremarkable. No pancreatic ductal dilatation or surrounding inflammatory changes. Spleen: A 12 mm splenic hypodense lesion which is not characterized but may represent a cyst or hemangioma. Adrenals/Urinary Tract: Mild left adrenal thickening. The right adrenal gland is unremarkable. The kidneys, visualized ureters, and urinary bladder appear unremarkable. Stomach/Bowel: There is moderate stool throughout the colon. There are scattered colonic diverticula without active inflammatory changes. There is no bowel obstruction. The appendix is normal as visualized. Lymphatic: No definite adenopathy. Reproductive: Hysterectomy. Other: Diffuse subcutaneous edema and anasarca. Musculoskeletal: Degenerative changes of the spine. No acute osseous pathology. Review of the MIP images confirms the above findings. IMPRESSION: 1. No acute intrathoracic, abdominal, or pelvic pathology. 2. Status post endovascular stent graft repair of the distal aortic arch and descending thoracic aorta. The stent graft appears patent. No periaortic fluid collection or contrast extravasation. 3. Stable extraluminal contrast along the distal aspect of the left external iliac artery stent, possibly a pseudoaneurysm. There is associated low density structure adjacent to this extraluminal contrast which was also present on the prior CT and may represent a thrombosed portion of the pseudoaneurysm or a seroma. Overall no significant interval change since the CT of 10/14/2019. Attention on follow-up imaging recommended. 4. Colonic diverticulosis. No bowel obstruction. Normal appendix. 5. Aortic Atherosclerosis (ICD10-I70.0) and Emphysema (ICD10-J43.9). Electronically Signed   By: Anner Crete M.D.   On: 12/07/2019  00:13   Diagnoses that have been ruled out:  None  Diagnoses that are still under consideration:  None  Final diagnoses:  Bilateral leg pain   Plan discharge  Rolland Porter, MD, Barbette Or, MD 12/07/19 [phone]

## 2019-12-07 NOTE — Discharge Instructions (Signed)
Please call Dr. Stephens Shire office to follow-up for your continuing problems in your lower legs.  Your scans tonight do not show any acute changes.  You should have pain pills you can take at home as needed.  Use a walker to make sure your knees do not buckle and you fall.  You can talk to your primary care doctor to see if you would benefit from some more physical therapy to get the strength back in your legs.

## 2019-12-13 ENCOUNTER — Telehealth: Payer: Self-pay

## 2019-12-13 NOTE — Telephone Encounter (Signed)
lmom to r/s appt  

## 2019-12-22 ENCOUNTER — Other Ambulatory Visit: Payer: Self-pay

## 2019-12-22 ENCOUNTER — Ambulatory Visit
Admission: RE | Admit: 2019-12-22 | Discharge: 2019-12-22 | Disposition: A | Discharge status: Home / Self Care | Payer: Medicare Other | Source: Ambulatory Visit | Attending: Internal Medicine | Admitting: Internal Medicine

## 2019-12-22 DIAGNOSIS — Z1231 Encounter for screening mammogram for malignant neoplasm of breast: Secondary | ICD-10-CM

## 2019-12-26 ENCOUNTER — Other Ambulatory Visit: Payer: Self-pay | Admitting: Internal Medicine

## 2019-12-26 DIAGNOSIS — R928 Other abnormal and inconclusive findings on diagnostic imaging of breast: Secondary | ICD-10-CM

## 2019-12-27 ENCOUNTER — Ambulatory Visit: Payer: Medicare Other

## 2019-12-31 ENCOUNTER — Other Ambulatory Visit: Payer: Self-pay

## 2019-12-31 ENCOUNTER — Ambulatory Visit
Admission: RE | Admit: 2019-12-31 | Discharge: 2019-12-31 | Disposition: A | Discharge status: Home / Self Care | Payer: Medicare Other | Source: Ambulatory Visit | Attending: Internal Medicine | Admitting: Internal Medicine

## 2019-12-31 DIAGNOSIS — R928 Other abnormal and inconclusive findings on diagnostic imaging of breast: Secondary | ICD-10-CM

## 2020-01-26 ENCOUNTER — Ambulatory Visit: Payer: Medicare Other

## 2020-01-28 ENCOUNTER — Other Ambulatory Visit: Payer: Self-pay

## 2020-01-28 ENCOUNTER — Encounter (HOSPITAL_COMMUNITY): Payer: Self-pay | Admitting: Emergency Medicine

## 2020-01-28 ENCOUNTER — Observation Stay (HOSPITAL_COMMUNITY)
Admission: EM | Admit: 2020-01-28 | Discharge: 2020-01-30 | Disposition: A | Discharge status: Home / Self Care | Payer: Medicare Other | Attending: Internal Medicine | Admitting: Internal Medicine

## 2020-01-28 DIAGNOSIS — E1159 Type 2 diabetes mellitus with other circulatory complications: Secondary | ICD-10-CM | POA: Diagnosis present

## 2020-01-28 DIAGNOSIS — I129 Hypertensive chronic kidney disease with stage 1 through stage 4 chronic kidney disease, or unspecified chronic kidney disease: Secondary | ICD-10-CM | POA: Insufficient documentation

## 2020-01-28 DIAGNOSIS — E785 Hyperlipidemia, unspecified: Secondary | ICD-10-CM | POA: Insufficient documentation

## 2020-01-28 DIAGNOSIS — R2990 Unspecified symptoms and signs involving the nervous system: Secondary | ICD-10-CM | POA: Diagnosis present

## 2020-01-28 DIAGNOSIS — F17210 Nicotine dependence, cigarettes, uncomplicated: Secondary | ICD-10-CM | POA: Diagnosis not present

## 2020-01-28 DIAGNOSIS — E1169 Type 2 diabetes mellitus with other specified complication: Secondary | ICD-10-CM | POA: Diagnosis present

## 2020-01-28 DIAGNOSIS — Z79899 Other long term (current) drug therapy: Secondary | ICD-10-CM | POA: Diagnosis not present

## 2020-01-28 DIAGNOSIS — J449 Chronic obstructive pulmonary disease, unspecified: Secondary | ICD-10-CM | POA: Diagnosis not present

## 2020-01-28 DIAGNOSIS — Z7984 Long term (current) use of oral hypoglycemic drugs: Secondary | ICD-10-CM | POA: Diagnosis not present

## 2020-01-28 DIAGNOSIS — I480 Paroxysmal atrial fibrillation: Secondary | ICD-10-CM | POA: Diagnosis not present

## 2020-01-28 DIAGNOSIS — Z950 Presence of cardiac pacemaker: Secondary | ICD-10-CM | POA: Insufficient documentation

## 2020-01-28 DIAGNOSIS — U071 COVID-19: Principal | ICD-10-CM | POA: Insufficient documentation

## 2020-01-28 DIAGNOSIS — N179 Acute kidney failure, unspecified: Secondary | ICD-10-CM | POA: Diagnosis not present

## 2020-01-28 DIAGNOSIS — R202 Paresthesia of skin: Secondary | ICD-10-CM | POA: Insufficient documentation

## 2020-01-28 DIAGNOSIS — E876 Hypokalemia: Secondary | ICD-10-CM | POA: Diagnosis present

## 2020-01-28 DIAGNOSIS — R200 Anesthesia of skin: Secondary | ICD-10-CM | POA: Diagnosis present

## 2020-01-28 DIAGNOSIS — I152 Hypertension secondary to endocrine disorders: Secondary | ICD-10-CM | POA: Diagnosis present

## 2020-01-28 DIAGNOSIS — N1832 Chronic kidney disease, stage 3b: Secondary | ICD-10-CM | POA: Insufficient documentation

## 2020-01-28 DIAGNOSIS — R079 Chest pain, unspecified: Secondary | ICD-10-CM | POA: Diagnosis not present

## 2020-01-28 DIAGNOSIS — Z7982 Long term (current) use of aspirin: Secondary | ICD-10-CM | POA: Diagnosis not present

## 2020-01-28 DIAGNOSIS — E039 Hypothyroidism, unspecified: Secondary | ICD-10-CM | POA: Diagnosis present

## 2020-01-28 DIAGNOSIS — E1122 Type 2 diabetes mellitus with diabetic chronic kidney disease: Secondary | ICD-10-CM | POA: Diagnosis not present

## 2020-01-28 HISTORY — DX: Unspecified cataract: H26.9

## 2020-01-28 NOTE — ED Triage Notes (Signed)
Pt states she had been dizzy since 2145 and it was time to check her BP (checks it before bed) and her first reading was low for her so she checked it again and "it was even lower". Pt reports first BP reading was 105/56 and second one was 87/49. Pt states she had cataract surgery on R eye yesterday.

## 2020-01-29 ENCOUNTER — Encounter (HOSPITAL_COMMUNITY): Payer: Self-pay | Admitting: Internal Medicine

## 2020-01-29 ENCOUNTER — Emergency Department (HOSPITAL_COMMUNITY): Payer: Medicare Other

## 2020-01-29 ENCOUNTER — Observation Stay (HOSPITAL_BASED_OUTPATIENT_CLINIC_OR_DEPARTMENT_OTHER): Payer: Medicare Other

## 2020-01-29 ENCOUNTER — Observation Stay (HOSPITAL_COMMUNITY): Payer: Medicare Other

## 2020-01-29 DIAGNOSIS — J449 Chronic obstructive pulmonary disease, unspecified: Secondary | ICD-10-CM | POA: Diagnosis present

## 2020-01-29 DIAGNOSIS — N179 Acute kidney failure, unspecified: Secondary | ICD-10-CM | POA: Diagnosis present

## 2020-01-29 DIAGNOSIS — R202 Paresthesia of skin: Secondary | ICD-10-CM

## 2020-01-29 DIAGNOSIS — R200 Anesthesia of skin: Secondary | ICD-10-CM

## 2020-01-29 DIAGNOSIS — I480 Paroxysmal atrial fibrillation: Secondary | ICD-10-CM

## 2020-01-29 DIAGNOSIS — R079 Chest pain, unspecified: Secondary | ICD-10-CM

## 2020-01-29 DIAGNOSIS — I371 Nonrheumatic pulmonary valve insufficiency: Secondary | ICD-10-CM | POA: Diagnosis not present

## 2020-01-29 DIAGNOSIS — I361 Nonrheumatic tricuspid (valve) insufficiency: Secondary | ICD-10-CM

## 2020-01-29 LAB — CBC
HCT: 33.8 % — ABNORMAL LOW (ref 36.0–46.0)
Hemoglobin: 10.5 g/dL — ABNORMAL LOW (ref 12.0–15.0)
MCH: 28.2 pg (ref 26.0–34.0)
MCHC: 31.1 g/dL (ref 30.0–36.0)
MCV: 90.9 fL (ref 80.0–100.0)
Platelets: 183 10*3/uL (ref 150–400)
RBC: 3.72 MIL/uL — ABNORMAL LOW (ref 3.87–5.11)
RDW: 23.2 % — ABNORMAL HIGH (ref 11.5–15.5)
WBC: 5.1 10*3/uL (ref 4.0–10.5)
nRBC: 0 % (ref 0.0–0.2)

## 2020-01-29 LAB — APTT: aPTT: 28 seconds (ref 24–36)

## 2020-01-29 LAB — COMPREHENSIVE METABOLIC PANEL
ALT: 15 U/L (ref 0–44)
AST: 23 U/L (ref 15–41)
Albumin: 3.6 g/dL (ref 3.5–5.0)
Alkaline Phosphatase: 56 U/L (ref 38–126)
Anion gap: 10 (ref 5–15)
BUN: 31 mg/dL — ABNORMAL HIGH (ref 8–23)
CO2: 29 mmol/L (ref 22–32)
Calcium: 8.6 mg/dL — ABNORMAL LOW (ref 8.9–10.3)
Chloride: 101 mmol/L (ref 98–111)
Creatinine, Ser: 2.19 mg/dL — ABNORMAL HIGH (ref 0.44–1.00)
GFR, Estimated: 24 mL/min — ABNORMAL LOW (ref >60)
Glucose, Bld: 140 mg/dL — ABNORMAL HIGH (ref 70–99)
Potassium: 3.4 mmol/L — ABNORMAL LOW (ref 3.5–5.1)
Sodium: 140 mmol/L (ref 135–145)
Total Bilirubin: 0.3 mg/dL (ref 0.3–1.2)
Total Protein: 7.4 g/dL (ref 6.5–8.1)

## 2020-01-29 LAB — LIPID PANEL
Cholesterol: 141 mg/dL (ref 0–200)
HDL: 44 mg/dL (ref >40)
LDL Cholesterol: 80 mg/dL (ref 0–99)
Total CHOL/HDL Ratio: 3.2 RATIO
Triglycerides: 84 mg/dL (ref <150)
VLDL: 17 mg/dL (ref 0–40)

## 2020-01-29 LAB — CBG MONITORING, ED
Glucose-Capillary: 103 mg/dL — ABNORMAL HIGH (ref 70–99)
Glucose-Capillary: 126 mg/dL — ABNORMAL HIGH (ref 70–99)
Glucose-Capillary: 133 mg/dL — ABNORMAL HIGH (ref 70–99)
Glucose-Capillary: 79 mg/dL (ref 70–99)

## 2020-01-29 LAB — DIFFERENTIAL
Abs Immature Granulocytes: 0.02 10*3/uL (ref 0.00–0.07)
Basophils Absolute: 0 10*3/uL (ref 0.0–0.1)
Basophils Relative: 0 %
Eosinophils Absolute: 0.1 10*3/uL (ref 0.0–0.5)
Eosinophils Relative: 2 %
Immature Granulocytes: 0 %
Lymphocytes Relative: 23 %
Lymphs Abs: 1.2 10*3/uL (ref 0.7–4.0)
Monocytes Absolute: 0.5 10*3/uL (ref 0.1–1.0)
Monocytes Relative: 9 %
Neutro Abs: 3.4 10*3/uL (ref 1.7–7.7)
Neutrophils Relative %: 66 %

## 2020-01-29 LAB — BRAIN NATRIURETIC PEPTIDE: B Natriuretic Peptide: 147 pg/mL — ABNORMAL HIGH (ref 0.0–100.0)

## 2020-01-29 LAB — ECHOCARDIOGRAM COMPLETE
Area-P 1/2: 2.51 cm2
Height: 65 in
S' Lateral: 2.69 cm
Weight: 2320 oz

## 2020-01-29 LAB — PROTIME-INR
INR: 1 (ref 0.8–1.2)
Prothrombin Time: 12.4 seconds (ref 11.4–15.2)

## 2020-01-29 LAB — TROPONIN I (HIGH SENSITIVITY)
Troponin I (High Sensitivity): 21 ng/L — ABNORMAL HIGH (ref <18)
Troponin I (High Sensitivity): 20 ng/L — ABNORMAL HIGH (ref <18)

## 2020-01-29 LAB — MAGNESIUM: Magnesium: 1.9 mg/dL (ref 1.7–2.4)

## 2020-01-29 LAB — ETHANOL: Alcohol, Ethyl (B): <10 mg/dL (ref <10)

## 2020-01-29 LAB — HEMOGLOBIN A1C
Hgb A1c MFr Bld: 7 % — ABNORMAL HIGH (ref 4.8–5.6)
Mean Plasma Glucose: 154.2 mg/dL

## 2020-01-29 LAB — PHOSPHORUS: Phosphorus: 3.6 mg/dL (ref 2.5–4.6)

## 2020-01-29 MED ORDER — ACETAMINOPHEN 325 MG PO TABS
650.0000 mg | ORAL_TABLET | Freq: Four times a day (QID) | ORAL | Status: DC | PRN
  Administered 2020-01-30: 650 mg via ORAL
  Filled 2020-01-29: qty 2

## 2020-01-29 MED ORDER — ACETAMINOPHEN 650 MG RE SUPP: 650.0000 mg | Freq: Four times a day (QID) | RECTAL | Status: DC | PRN

## 2020-01-29 MED ORDER — ONDANSETRON HCL 4 MG/2ML IJ SOLN: 4.0000 mg | Freq: Four times a day (QID) | INTRAMUSCULAR | Status: DC | PRN

## 2020-01-29 MED ORDER — INSULIN ASPART 100 UNIT/ML ~~LOC~~ SOLN
0.0000 [IU] | Freq: Three times a day (TID) | SUBCUTANEOUS | Status: DC
  Administered 2020-01-29: 2 [IU] via SUBCUTANEOUS
  Filled 2020-01-29: qty 1

## 2020-01-29 MED ORDER — IOHEXOL 350 MG/ML SOLN
75.0000 mL | Freq: Once | INTRAVENOUS | Status: AC | PRN
  Administered 2020-01-29: 75 mL via INTRAVENOUS

## 2020-01-29 MED ORDER — SERTRALINE HCL 50 MG PO TABS
50.0000 mg | ORAL_TABLET | Freq: Every day | ORAL | Status: DC
  Administered 2020-01-29: 50 mg via ORAL
  Filled 2020-01-29: qty 1

## 2020-01-29 MED ORDER — MELATONIN 3 MG PO TABS
9.0000 mg | ORAL_TABLET | Freq: Every day | ORAL | Status: DC
  Administered 2020-01-29: 9 mg via ORAL
  Filled 2020-01-29: qty 3

## 2020-01-29 MED ORDER — HYDRALAZINE HCL 25 MG PO TABS
100.0000 mg | ORAL_TABLET | Freq: Three times a day (TID) | ORAL | Status: DC
  Administered 2020-01-29 – 2020-01-30 (×3): 100 mg via ORAL
  Filled 2020-01-29 (×3): qty 4

## 2020-01-29 MED ORDER — LACTATED RINGERS IV SOLN: INTRAVENOUS | Status: AC

## 2020-01-29 MED ORDER — CLONIDINE HCL 0.1 MG PO TABS
0.1000 mg | ORAL_TABLET | Freq: Two times a day (BID) | ORAL | Status: DC
  Administered 2020-01-29 – 2020-01-30 (×3): 0.1 mg via ORAL
  Filled 2020-01-29 (×4): qty 1

## 2020-01-29 MED ORDER — CARVEDILOL 12.5 MG PO TABS
25.0000 mg | ORAL_TABLET | Freq: Two times a day (BID) | ORAL | Status: DC
Start: 2020-01-29 — End: 2020-01-30
  Administered 2020-01-29 – 2020-01-30 (×3): 25 mg via ORAL
  Filled 2020-01-29 (×3): qty 2

## 2020-01-29 MED ORDER — LORAZEPAM 2 MG/ML IJ SOLN: 0.5000 mg | Freq: Once | INTRAMUSCULAR | Status: DC | PRN

## 2020-01-29 MED ORDER — STROKE: EARLY STAGES OF RECOVERY BOOK
Freq: Once | Status: DC
  Filled 2020-01-29: qty 1

## 2020-01-29 MED ORDER — HYDRALAZINE HCL 20 MG/ML IJ SOLN
10.0000 mg | INTRAMUSCULAR | Status: DC | PRN
  Administered 2020-01-29 – 2020-01-30 (×2): 10 mg via INTRAVENOUS
  Filled 2020-01-29 (×2): qty 1

## 2020-01-29 MED ORDER — ENOXAPARIN SODIUM 30 MG/0.3ML ~~LOC~~ SOLN: 30.0000 mg | SUBCUTANEOUS | Status: DC

## 2020-01-29 MED ORDER — POTASSIUM CHLORIDE CRYS ER 20 MEQ PO TBCR
20.0000 meq | EXTENDED_RELEASE_TABLET | Freq: Once | ORAL | Status: AC
  Administered 2020-01-29: 20 meq via ORAL
  Filled 2020-01-29: qty 1

## 2020-01-29 MED ORDER — ONDANSETRON HCL 4 MG PO TABS: 4.0000 mg | ORAL_TABLET | Freq: Four times a day (QID) | ORAL | Status: DC | PRN

## 2020-01-29 MED ORDER — OXYCODONE HCL 5 MG PO TABS
5.0000 mg | ORAL_TABLET | Freq: Once | ORAL | Status: AC
  Administered 2020-01-29: 5 mg via ORAL

## 2020-01-29 MED ORDER — ROSUVASTATIN CALCIUM 20 MG PO TABS
20.0000 mg | ORAL_TABLET | Freq: Every day | ORAL | Status: DC
  Administered 2020-01-30: 20 mg via ORAL
  Filled 2020-01-29 (×2): qty 1

## 2020-01-29 MED ORDER — OXYCODONE HCL 5 MG PO TABS
10.0000 mg | ORAL_TABLET | Freq: Four times a day (QID) | ORAL | Status: DC | PRN
  Administered 2020-01-30 (×2): 10 mg via ORAL
  Filled 2020-01-29 (×2): qty 2

## 2020-01-29 MED ORDER — VITAMIN D 25 MCG (1000 UNIT) PO TABS
1000.0000 [IU] | ORAL_TABLET | Freq: Every morning | ORAL | Status: DC
  Administered 2020-01-29 – 2020-01-30 (×2): 1000 [IU] via ORAL
  Filled 2020-01-29 (×2): qty 1

## 2020-01-29 MED ORDER — LACTATED RINGERS IV BOLUS
500.0000 mL | Freq: Once | INTRAVENOUS | Status: AC
  Administered 2020-01-29: 500 mL via INTRAVENOUS

## 2020-01-29 MED ORDER — LEVOTHYROXINE SODIUM 50 MCG PO TABS
25.0000 ug | ORAL_TABLET | Freq: Every day | ORAL | Status: DC
  Administered 2020-01-29 – 2020-01-30 (×2): 25 ug via ORAL
  Filled 2020-01-29 (×2): qty 1

## 2020-01-29 MED ORDER — PANTOPRAZOLE SODIUM 40 MG PO TBEC
40.0000 mg | DELAYED_RELEASE_TABLET | Freq: Every day | ORAL | Status: DC
  Administered 2020-01-29 – 2020-01-30 (×2): 40 mg via ORAL
  Filled 2020-01-29 (×2): qty 1

## 2020-01-29 MED ORDER — GABAPENTIN 100 MG PO CAPS
100.0000 mg | ORAL_CAPSULE | Freq: Three times a day (TID) | ORAL | Status: DC
  Administered 2020-01-29 – 2020-01-30 (×3): 100 mg via ORAL
  Filled 2020-01-29 (×3): qty 1

## 2020-01-29 MED ORDER — OXYCODONE HCL 5 MG PO TABS
5.0000 mg | ORAL_TABLET | Freq: Three times a day (TID) | ORAL | Status: DC | PRN
  Administered 2020-01-29 (×2): 5 mg via ORAL
  Filled 2020-01-29 (×3): qty 1

## 2020-01-29 NOTE — H&P (Signed)
History and Physical    [name] [id number] DOB: 1950-12-26 DOA: 01/28/2020  PCP: Antonietta Jewel, MD   Patient coming from: Home.   I have personally briefly reviewed patient's old medical records in [place]  Chief Complaint: RLE numbness.  HPI: [name] is a 70 y.o. female with medical history significant of anemia, anxiety, depression, osteoarthritis, transient atrial fibrillation/flutter, COPD, type 2 diabetes, diverticulosis, DJD, GERD, gout, history of pneumonia, hypertension, hypothyroidism, internal hemorrhoids, who had cataract surgery on her right eye on Friday and is coming to the emergency department with complaints of low blood pressure after she started some new eyedrops that she has been taking 3 times a day.  The patient states that a neighbor helped her with her eyedrops around 2130 and then she felt lightheaded with her blood pressure being 105/56 and then 87/49 mmHg.  She states that she has a blood pressure usually in the 130s and 140s.  She mentions that shortly after this she started feeling left-sided lower extremity numbness and tingling sensation.  She denies L UE or facial weakness.  She denies headache, nausea, emesis, diaphoresis, palpitations or recent pitting edema of the lower extremities.  Asked the patient was getting in the ambulance she started having left-sided chest pressure, which has subsided since then.  She denies fever, chills, sore throat rhinorrhea.  No wheezing or hemoptysis.  Denies abdominal pain, diarrhea, constipation, melena or hematochezia.  Denies dysuria, frequency or hematuria.  Denies polyuria, polydipsia, polyphagia or blurred vision.  ED Course: 98.3 F, pulse 65, respiration 19, BP 122/74 mmHg O2 sat 100% on room air.  The patient was given a 500 mL LR bolus.  Labwork: CBC showed a white count of 5.1, hemoglobin 10.5 g/dL platelets 183.  PT/INR/PTT within normal limits.  Troponin was 21 and then 20 ng/L.  BNP  847.0 pg/mL.  CMP shows a potassium of 3.4 mmol/L.  Her glucose 140, BUN 31, creatinine 2.19 and calcium 8.6 mg/dL.  Her most recent creatinine was 1.28 mg/dL on 12/06/2019.  Magnesium was 1.9 and phosphorus 3.6 mg/dL.  Imaging: Chest radiograph shows stable cardiomegaly and interstitial coarsening and septal thickening, which raises suspicions for pulmonary edema.  CT head code stroke without contrast did not show any acute intracranial infarct or any other abnormality.  Please see images and full regular report for further detail.  Review of Systems: As per HPI otherwise all other systems reviewed and are negative.  Past Medical History:  Diagnosis Date  . Anemia   . Anxiety   . Arthritis   . Atrial fib/flutter, transient   . Cataract   . COPD (chronic obstructive pulmonary disease) ([place])   . Depression   . Diabetes mellitus   . Diverticulosis   . DJD (degenerative joint disease)   . GERD (gastroesophageal reflux disease)   . Gout   . History of pneumonia   . Hypertension   . Hypothyroidism   . Internal hemorrhoids     Past Surgical History:  Procedure Laterality Date  . CATARACT EXTRACTION W/ INTRAOCULAR LENS IMPLANT Left   . ENDARTERECTOMY Left 09/09/2019   Procedure: LEFT ILIAC ENDARTERECTOMY;  Surgeon: Serafina Mitchell, MD;  Location: [place];  Service: Vascular;  Laterality: Left;  . HERNIA REPAIR    . INSERTION OF ILIAC STENT Left 09/09/2019   Procedure: INSERTION OF LEFT ILIAC STENT;  Surgeon: Serafina Mitchell, MD;  Location: [place];  Service: Vascular;  Laterality: Left;  . PACEMAKER INSERTION    .  THORACIC AORTIC ENDOVASCULAR STENT GRAFT N/A 09/09/2019   Procedure: THORACIC AORTIC ENDOVASCULAR STENT GRAFT WITH LEFT ILIAC CONDUIT;  Surgeon: Serafina Mitchell, MD;  Location: [facility];  Service: Vascular;  Laterality: N/A;  . TUBAL LIGATION    . ULTRASOUND GUIDANCE FOR VASCULAR ACCESS Left 09/09/2019   Procedure: ULTRASOUND GUIDANCE FOR VASCULAR ACCESS;  Surgeon: Serafina Mitchell,  MD;  Location: [place];  Service: Vascular;  Laterality: Left;  Marland Kitchen VAGINAL HYSTERECTOMY      Social History  reports that she has been smoking cigarettes. She has a 15.00 pack-year smoking history. She has never used smokeless tobacco. She reports that she does not drink alcohol and does not use drugs.  Allergies  Allergen Reactions  . Amlodipine Swelling    Started very close to episode of angioedema to ACE, unknown if this added effects or just residual  . Lisinopril     angioedema  . Losartan Swelling    Lip swelling   . Codeine Phosphate Other (See Comments)    REACTION: UNKNOWN  . Morphine And Related Other (See Comments)    Cannot take:Heart problems  . Naproxen Nausea And Vomiting  . Nsaids Nausea Only  . Penicillins     UNKNOWN REACTION  . Propoxyphene N-Acetaminophen Other (See Comments)    Makes me gag  . Sulfamethoxazole-Trimethoprim Other (See Comments)    Gi upset  . Flexeril [Cyclobenzaprine] Anxiety and Other (See Comments)    Pt states medication gives her nightmares and insomnia.    Family History  Problem Relation Age of Onset  . Prostate cancer Father   . Hypertension Mother   . Heart attack Mother   . Heart disease Mother   . Kidney disease Mother 50  . Stomach cancer Son   . Diabetes Maternal Aunt   . CAD Maternal Aunt   . Stroke Maternal Aunt   . Heart disease Maternal Uncle   . CAD Maternal Uncle   . Stroke Maternal Uncle   . Colon cancer Neg Hx   . Esophageal cancer Neg Hx   . Rectal cancer Neg Hx    Prior to Admission medications   Medication Sig Start Date End Date Taking? Authorizing Provider  aspirin 81 MG tablet Take 81 mg by mouth every morning.     [provider]  carvedilol (COREG) 25 MG tablet Take 1 tablet (25 mg total) by mouth 2 (two) times daily with a meal. 10/11/19   Evans Lance, MD  cholecalciferol (VITAMIN D) 1000 UNITS tablet Take 1,000 Units by mouth every morning.     [provider]  cloNIDine  (CATAPRES - DOSED IN MG/24 HR) 0.2 mg/24hr patch Place 0.2 mg onto the skin in the morning and at bedtime. Take two tablets twice daily.    [provider]  cloNIDine (CATAPRES) 0.1 MG tablet Take 1 tablet (0.1 mg total) by mouth 2 (two) times daily. 09/19/19   Pokhrel, Corrie Mckusick, MD  gabapentin (NEURONTIN) 100 MG capsule Take 1 capsule (100 mg total) by mouth 3 (three) times daily. 09/19/19   Pokhrel, Corrie Mckusick, MD  hydrALAZINE (APRESOLINE) 100 MG tablet Take 100 mg by mouth 3 (three) times daily. 09/29/19   [provider]  hydrochlorothiazide (HYDRODIURIL) 12.5 MG tablet Take 1 tablet (12.5 mg total) by mouth daily. 11/28/19   Skeet Latch, MD  levothyroxine (SYNTHROID) 25 MCG tablet Take 1 tablet (25 mcg total) by mouth daily. 09/19/19   Pokhrel, Corrie Mckusick, MD  lidocaine (LMX) 4 % cream Apply topically  4 (four) times daily as needed (left upper extremity pain). 09/19/19   Pokhrel, Corrie Mckusick, MD  melatonin 3 MG TABS tablet Take 3 tablets (9 mg total) by mouth at bedtime. 09/19/19   Pokhrel, Corrie Mckusick, MD  metFORMIN (GLUCOPHAGE) 500 MG tablet Take 500 mg by mouth 2 (two) times daily.    [provider]  Multiple Vitamin (MULTIVITAMIN) tablet Take 1 tablet by mouth every morning.     [provider]  nicotine (NICODERM CQ - DOSED IN MG/24 HR) 7 mg/24hr patch Place 1 patch (7 mg total) onto the skin daily. 09/20/19   Pokhrel, Corrie Mckusick, MD  Oxycodone HCl 10 MG TABS Take 10 mg by mouth 4 (four) times daily as needed. 08/08/19   [provider]  pantoprazole (PROTONIX) 40 MG tablet Take 1 tablet (40 mg total) by mouth daily. 09/20/19   Pokhrel, Corrie Mckusick, MD  polyethylene glycol (MIRALAX / GLYCOLAX) packet Take 17 g by mouth 2 (two) times daily.     [provider]  rosuvastatin (CRESTOR) 20 MG tablet Take 1 tablet (20 mg total) by mouth daily. 09/20/19   Pokhrel, Corrie Mckusick, MD  sertraline (ZOLOFT) 100 MG tablet Take 50 mg by mouth at bedtime.     [provider]     Physical Exam: Vitals:   01/29/20 0200 01/29/20 0230 01/29/20 0255 01/29/20 0300  BP: 113/65 111/60  131/71  Pulse: 65 61 63 64  Resp: 13 14 14 18   Temp:      TempSrc:      SpO2: 94% 100% 99% 96%  Weight:      Height:        Constitutional: NAD, calm, comfortable Eyes: PERRL, lids, conjunctiva and sclera are injected on right eye. ENMT: Mucous membranes are moist. Posterior pharynx clear of any exudate or lesions. Neck: normal, supple, no masses, no thyromegaly Respiratory: clear to auscultation bilaterally, no wheezing, no crackles. Normal respiratory effort. No accessory muscle use.  Cardiovascular: Regular rate and rhythm, no murmurs / rubs / gallops. No extremity edema. 2+ pedal pulses. No carotid bruits.  Abdomen: No distention.  Bowel sounds positive.  Soft, no tenderness, no masses palpated. No hepatosplenomegaly. Musculoskeletal: Mild generalized weakness.  No clubbing / cyanosis.  Good ROM, no contractures. Normal muscle tone.  Skin: no obvious rashes, lesions, ulcers on very limited dermatological examination. Neurologic: CN 2-12 grossly intact. Sensation seems to be intact, DTR normal.  Good coordination bilaterally on nose to finger test.  Strength 4/5 in LLE and 5/5 on all other extremities. Psychiatric: Normal judgment and insight. Alert and oriented x 3. Normal mood.   Labs on Admission: I have personally reviewed following labs and imaging studies  CBC: Recent Labs  Lab 01/29/20 0040  WBC 5.1  NEUTROABS 3.4  HGB 10.5*  HCT 33.8*  MCV 90.9  PLT [id number]    Basic Metabolic Panel: Recent Labs  Lab 01/29/20 0040  NA 140  K 3.4*  CL 101  CO2 29  GLUCOSE 140*  BUN 31*  CREATININE 2.19*  CALCIUM 8.6*    GFR: Estimated Creatinine Clearance: 21.5 mL/min (A) (by C-G formula based on SCr of 2.19 mg/dL (H)).  Liver Function Tests: Recent Labs  Lab 01/29/20 0040  AST 23  ALT 15  ALKPHOS 56  BILITOT 0.3  PROT 7.4  ALBUMIN 3.6   Radiological Exams  on Admission: DG Chest 1 View  Result Date: 01/29/2020 CLINICAL DATA:  Left-sided chest pain. EXAM: CHEST  1 VIEW COMPARISON:  Radiograph 09/15/2019. Most recent  chest CTA 12/06/2019 FINDINGS: Right-sided pacemaker in place. Stable cardiomegaly. Unchanged mediastinal contours as well as descending thoracic aortic stent graft. New interstitial coarsening and septal thickening suspicious for pulmonary edema. No significant pleural fluid. No focal airspace disease. No pneumothorax. Stable osseous structures. IMPRESSION: New interstitial coarsening and septal thickening suspicious for pulmonary edema. Stable cardiomegaly. Electronically Signed   By: Keith Rake M.D.   On: 01/29/2020 01:26   CT HEAD CODE STROKE WO CONTRAST  Result Date: 01/29/2020 CLINICAL DATA:  Code stroke. Initial evaluation for acute left lower extremity numbness. EXAM: CT HEAD WITHOUT CONTRAST TECHNIQUE: Contiguous axial images were obtained from the base of the skull through the vertex without intravenous contrast. COMPARISON:  Prior CT from 02/15/2005 FINDINGS: Brain: Cerebral volume within normal limits for age. Mild chronic microvascular ischemic disease. No acute intracranial hemorrhage. No acute large vessel territory infarct. No mass lesion, midline shift or mass effect. No hydrocephalus or extra-axial fluid collection. Vascular: No hyperdense vessel. Scattered vascular calcifications noted within the carotid siphons. Skull: Scalp soft tissues within normal limits. No calvarial fracture. Sinuses/Orbits: Globes and orbital soft tissues within normal limits. Paranasal sinuses are clear. No significant mastoid effusion. Other: None. ASPECTS [facility] Stroke Program Early CT Score) - Ganglionic level infarction (caudate, lentiform nuclei, internal capsule, insula, M1-M3 cortex): 7 - Supraganglionic infarction (M4-M6 cortex): 3 Total score (0-10 with 10 being normal): 10 IMPRESSION: 1. No acute intracranial infarct or other abnormality.  2. ASPECTS is 10. 3. Mild chronic microvascular ischemic disease. Results were called by telephone at the time of interpretation on 01/29/2020 at 12:49 am to provider IVA KNAPP , who verbally acknowledged these results. Electronically Signed   By: Jeannine Boga M.D.   On: 01/29/2020 00:49   08/29/2019 echocardiogram IMPRESSIONS:  1. Left ventricular ejection fraction, by estimation, is 65 to 70%. The  left ventricle has normal function. The left ventricle has no regional  wall motion abnormalities. There is mild concentric left ventricular  hypertrophy. Left ventricular diastolic  parameters are consistent with Grade I diastolic dysfunction (impaired  relaxation).  2. Right ventricular systolic function is normal. The right ventricular  size is normal. There is moderately elevated pulmonary artery systolic  pressure. The estimated right ventricular systolic pressure is 84.1 mmHg.  3. Left atrial size was mild to moderately dilated.  4. Right atrial size was mild to moderately dilated.  5. The mitral valve is degenerative. Trivial mitral valve regurgitation.  No evidence of mitral stenosis.  6. The aortic valve is tricuspid. Aortic valve regurgitation is not  visualized. Mild aortic valve stenosis. Aortic valve area, by VTI measures  1.97 cm. Aortic valve mean gradient measures 12.5 mmHg. Aortic valve Vmax  measures 2.35 m/s.  7. The inferior vena cava is dilated in size with >50% respiratory  variability, suggesting right atrial pressure of 8 mmHg.  EKG: Independently reviewed. Vent. rate 65 BPM PR interval * ms QRS duration 110 ms QT/QTc 429/447 ms P-R-T axes * 10 232 Atrial-paced rhythm LVH with secondary repolarization abnormality Anterior Q waves, possibly due to LVH  Assessment/Plan Principal Problem:   Numbness and tingling of left lower extremity Observation/telemetry. Frequent neuro checks. Swallow screen. PT/OT/SLP. Hemoglobin A1c and fasting  lipids. Echocardiogram and carotid Doppler. MRI of brain if pacemaker does not contraindicate. Antiplatelet agent held by neurology pending vascular surgery film review. Hold pharmacological DVT prophylaxis due to recent eye surgery.  Active Problems:   AKI (acute kidney injury) ([place]) Time-limited IV hydration. Hold hydrochlorothiazide per Monitor intake  and output. Follow renal function electrolytes.    Hypothyroidism Continue levothyroxine once dose confirmed.    Type 2 diabetes mellitus with hyperlipidemia ([facility]) Hold Metformin due to AKI. Carbohydrate modified diet. CBG monitoring with RI SS. Resume rosuvastatin 20 mg p.o. daily.    Hypertension associated with diabetes ([place]) Allow permissive hypertension. Hold antihypertensives. Monitor blood pressure.    Paroxysmal atrial fibrillation ([facility]) CHA?DS?-VASc Score of at least 8. Currently sinus rhythm. Not sure about her medications. Will need med reconciliation. As stated she is not taking anticoagulation.    Hypokalemia Replenished. Follow-up potassium level.    COPD (chronic obstructive pulmonary disease) ([place]) Supplemental oxygen as needed. Bronchodilators as needed.   DVT prophylaxis: SCDs (recent eye surgery). Code Status:   Full code. Family Communication: Disposition Plan:   Patient is from:  Home.  Anticipated DC to:  Home.  Anticipated DC date:  01/30/2020.  Anticipated DC barriers: Clinical status and work-up. Consults called:  Teleneurology. Admission status:  Observation/telemetry.  Severity of Illness:  High due to new neurological deficit on a patient with multiple risk factors for cerebral thromboembolic disease.  Reubin Milan MD [facility]  How to contact the [facility] Attending or Consulting provider [place] or covering provider during after hours [place], for this patient?   1. Check the care team in [facility] and look for a) attending/consulting [facility] provider listed and b) the [facility] team  listed 2. Log into [email or web address] and use [place]'s universal password to access. If you do not have the password, please contact the hospital operator. 3. Locate the [facility] provider you are looking for under [facility] and page to a number that you can be directly reached. 4. If you still have difficulty reaching the provider, please page the [facility] (Director on Call) for the Hospitalists listed on amion for assistance.  01/29/2020, 3:42 AM   This document was prepared using Dragon voice recognition software and may contain some unintended transcription errors.

## 2020-01-29 NOTE — Plan of Care (Signed)
  Problem: Acute Rehab PT Goals(only PT should resolve) Goal: Pt Will Transfer Bed To Chair/Chair To Bed Flowsheets (Taken 01/29/2020 1131) Pt will Transfer Bed to Chair/Chair to Bed: with modified independence Note: And no AD  Goal: Pt Will Ambulate Flowsheets (Taken 01/29/2020 1131) Pt will Ambulate:  100 feet  with modified independence  with least restrictive assistive device    11:32 AM, 01/29/20 Jerene Pitch, DPT Physical Therapy with [facility]  [phone] office

## 2020-01-29 NOTE — Consult Note (Addendum)
[facility] Consult Services   ADDENDUM 01/29/2019 03:43AM Followed up with Dr. Tomi Bamberger. She was able to obtain report for CTA chest/abdomen/pelvis and place this within her note (images read by radiology but report not crossing over into EMR).  No acute dissection or hematoma.  Note of: "Left proximal external iliac stent in place. Again seen extraluminal contrast along the distal aspect of the stent that has slightly increased in size from prior exam, pseudoaneurysm versus postsurgical change. No rupture or inflammation." Discussed with Dr. Tomi Bamberger.  She tells me that current plan is for transfer to another hospital for stroke workup as neurology follow up will not be available at this facility tomorrow.  It does not seem likely that these changes have caused her symptoms.  However, out of an abundance of caution, because of her very complicated vascular history, and because of note of these changes on her CTA, I recommended discussion of her symptoms and imaging findings with vascular surgery, prior to initiation of dual antiplatelet therapy with Aspirin and Plavix, to make sure this is felt safe from a vascular surgery perspective, and also to determine whether it is felt any further vascular testing or intervention is needed. Dr. Tomi Bamberger will relay this to the admitting hospitalist and ask that they speak with vascular surgery.  Alonna Buckler, D.O.    Date of Service:   01/29/2020 00:38:00  Diagnosis:     .  R53.1 - Weakness     .  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 She reports BP as low as 85 systolic at home.  CT head is negative for acute process. On exam, there is very mild LLE drift and sensory  changes. Dizziness seems most likely related to hypotension.  With regard to left leg symptoms, small vessel stroke is within the differential. However, based on her history of aortic dissection and left iliac stenosis, there's concern for a non-cerebrovascular cause such as aortic dissection or left iliac pathology. STAT CTA chest, abdomen, and pelvis was recommended and Dr. Tomi Bamberger has ordered this.   We discussed the possibility of IV tPA in the event that imaging is negative for dissection, but given cataract surgery yesterday with risk of intraocular bleeding with tPA, because her symptoms are overall mild, Ms. Storrs and I have decided risks outweigh benefits. Discussed with Dr. Tomi Bamberger, who indicates that she questions whether eye drops prescribed for her cataract surgery may be affecting her blood pressure. CTA chest/abdomen/pelvis is completed and results are pending. We will hold off on CTA head and neck because of impaired renal function and low index of suspicion for LVO. If CTA C/A/P does not have any findings that explain her left leg symptoms, would defer to Dr. Tomi Bamberger on whether any further vascular imaging of the left leg is needed. From a neuro standpoint, would recommend admission for stroke workup, and once any dissection is felt fully excluded, treatment for presumed minor ischemic stroke would include dual antiplatelet therapy with aspirin and plavix for 21 days, followed by plavix alone.  In house neurology to consult     Metrics: Last Known Well: 01/28/2020 21:45:00 [facility] Notification Time: 01/29/2020 00:38:00 Arrival Time: 01/28/2020 23:05:00 Stamp Time: 01/29/2020 00:38:00 Initial Response Time: 01/29/2020 00:39:38 Time First Login Attempt:  01/29/2020 00:39:38 Symptoms: Dizziness, left leg numbness, heaviness. NIHSS Start Assessment Time: 01/29/2020 00:43:44 Patient is not a candidate for Thrombolytic. Thrombolytic Medical Decision: 01/29/2020 01:24:39 Patient was not  deemed candidate for Thrombolytic because of following reasons: Minor symptoms, concern for intra-ocular bleeding due to recent cataract surgery. History of aortic dissection. After discussion with patient decided risks outweigh benefits.  CT head showed no acute hemorrhage or acute core infarct.  ED Physician notified of diagnostic impression and management plan on 01/29/2020 01:33:58  Advanced Imaging: Advanced Imaging Not Recommended because:  Poor Renal Function and Low Clinical Suspicion of LVO   Our recommendations are outlined below.  Recommendations:     .  Activate Stroke Protocol Admission/Order Set     .  Stroke/Telemetry Floor     .  Neuro Checks     .  Bedside Swallow Eval     .  DVT Prophylaxis     .  IV Fluids, Normal Saline     .  Head of Bed 30 Degrees     .  Euglycemia and Avoid Hyperthermia (PRN Acetaminophen)     .  Once dissection is felt to be fully excluded: initiate dual antiplatelet therapy with Aspirin 81 mg daily and Clopidogrel 75 mg daily for 21 days followed by Plavix alone.  Routine Consultation with [facility] for Follow up Care  Sign Out:     .  Discussed with Emergency Department Provider    ------------------------------------------------------------------------------  History of Present Illness: Patient is a 70 year old Female.  Patient was brought by EMS for symptoms of Dizziness, left leg numbness, heaviness.  70 year old womn with hx HTN, DM, A-fib on aspirin (per cardiology notes thought to be sporadic episode so maintained off anticoagulation), thoracic aortic dissection 08/2019 treated with endovascular stent graft placement. Also treated with stenting for left iliac stenosis. Presents today with c/o dizziness, lightheadedness that began at about 9:30 pm. She took her BP at home and it was [id number] systolic at first, then decreased to 80 systolic.  Improved to 114/68 currently. She's also had some left leg numbness/heaviness that has  been coming and going. She had some chest discomfort initially, but this has resolved, and has been having some pain in her belly near the incision for her prior aortic surgery.   Past Medical History:     . Hypertension     . Diabetes Mellitus     . Atrial Fibrillation     . Thoracic aortic dissection with intramural hematoma s/p repair 09/09/2019. Left iliac stenosis s/p stent, OSA, bradycardia s/p pacemaker  Social History: Smoking: Yes Alcohol Use: No Drug Use: No  Family History:son with stomach cancer Positive for chest discomfort, belly pain  Anticoagulant use:  No  Antiplatelet use: Yes Aspirin 81 mg  Allergies:  Reviewed    Examination: BP(114/68), Pulse(68), Blood Glucose(126) 1A: Level of Consciousness - Alert; keenly responsive + 0 1B: Ask Month and Age - Both Questions Right + 0 1C: Blink Eyes & Squeeze Hands - Performs Both Tasks + 0 2: Test Horizontal Extraocular Movements - Normal + 0 3: Test Visual Fields - No Visual Loss + 0 4: Test Facial Palsy (Use Grimace if Obtunded) - Normal symmetry + 0 5A: Test Left Arm Motor Drift - No Drift for 10 Seconds + 0 5B: Test Right Arm Motor Drift - No Drift for 10 Seconds + 0 6A: Test Left Leg Motor Drift - Drift, but doesn't hit bed + 1 6B: Test  Right Leg Motor Drift - No Drift for 5 Seconds + 0 7: Test Limb Ataxia (FNF/Heel-Shin) - No Ataxia + 0 8: Test Sensation - Mild-Moderate Loss: Less Sharp/More Dull + 1 9: Test Language/Aphasia - Normal; No aphasia + 0 10: Test Dysarthria - Normal + 0 11: Test Extinction/Inattention - No abnormality + 0  NIHSS Score: 2  Pre-Morbid Modified Rankin Scale: 0 Points = No symptoms at all   Patient/Family was informed the Neurology Consult would occur via TeleHealth consult by way of interactive audio and video telecommunications and consented to receiving care in this manner.   Patient is being evaluated for possible acute neurologic impairment and high probability of imminent  or life-threatening deterioration. I spent total of 65 minutes providing care to this patient, including time for face to face visit via telemedicine, review of medical records, imaging studies and discussion of findings with providers, the patient and/or family.   Dr Alonna Buckler   [facility] [phone]  Case [id number]

## 2020-01-29 NOTE — Progress Notes (Signed)
Patient seen and examined.  Admitted after midnight secondary to left lower extremity numbness, tingling and weakness.  Patient denies any healthcare neurologic deficits or dysarthria.  Elevated blood pressure appreciated on my examination; still with fairly to left lower weakness sensation.  No chest pain, no nausea, no vomiting.  Very hungry and like to have his diet advanced.  Please refer to H&P written by Dr. Olevia Bowens on 01/29/2019 for further info/details on admission.  Plan: -Complete work-up for TIA/stroke -Follow recommendations by neurology and vascular surgery -Continue risk factor modifications -Allow for permissive hypertension. -Patient has successfully passed swallowing evaluation will advance diet to heart healthy modified carbohydrate.   Barton Dubois MD [phone]

## 2020-01-29 NOTE — Progress Notes (Signed)
*  PRELIMINARY RESULTS* Echocardiogram 2D Echocardiogram has been performed.  Leavy Cella 01/29/2020, 9:41 AM

## 2020-01-29 NOTE — ED Notes (Signed)
Patient transported to CT 

## 2020-01-29 NOTE — ED Provider Notes (Addendum)
[facility] EMERGENCY DEPARTMENT Provider Note   CSN: [id number] Arrival date & time: 01/28/20  2305   Time seen 11:59 PM  History Chief Complaint  Patient presents with  . Hypotension    [name] is a 70 y.o. female.  HPI   Patient states she had cataract surgery on her right eye done yesterday.  She was started on new eyedrops which she is taking at breakfast, lunch, and dinner.  She states her neighbor came over and put them in her right eye at 9:30 PM.  At 10 PM she checks her blood pressure which she normally does every day.  She states her blood pressure is usually around 147/81.  However tonight when she checked it it was 105/56.  She checked it again a little bit later and it was 87/49.  She states about the same time she started feeling lightheaded and had some left sided numbness and tingling of her lower leg to just above her knee.  She denies any involvement of her face or her left arm.  She denies any nausea, vomiting, diarrhea, or headache.  She states she was able to walk despite the numbness.  She denies any prior history of stroke.  She also states that after she got into the ambulance she started having left-sided chest pressure.  She states she had 2 stents placed in August and the pain tonight was not as bad as that but similar.  She denies any shortness of breath or diaphoresis tonight.  PCP Antonietta Jewel, MD   Past Medical History:  Diagnosis Date  . Anemia   . Anxiety   . Arthritis   . Atrial fib/flutter, transient   . Cataract   . COPD (chronic obstructive pulmonary disease) ([place])   . Depression   . Diabetes mellitus   . Diverticulosis   . DJD (degenerative joint disease)   . GERD (gastroesophageal reflux disease)   . Gout   . History of pneumonia   . Hypertension   . Hypothyroidism   . Internal hemorrhoids     Patient Active Problem List   Diagnosis Date Noted  . Numbness and tingling of left lower extremity 01/29/2020  . COPD (chronic  obstructive pulmonary disease) ([place])   . AKI (acute kidney injury) ([place])   . Encounter for central line placement   . Hypotension due to drugs   . Hypoxia   . Dissection of thoracic aorta ([place])   . Intramural aortic hematoma ([place]) 08/28/2019  . Aortic aneurysm ([place]) 08/28/2019  . Hypokalemia 05/06/2017  . Abdominal pain 05/06/2017  . Chronic atrial fibrillation ([place]) 03/21/2014  . Enlarged lymph node 12/29/2011  . OTHER&UNSPECIFIED DISEASES THE ORAL SOFT TISSUES 08/17/2008  . GOUT 07/10/2008  . ANKLE PAIN, RIGHT 07/10/2008  . ASTHMATIC BRONCHITIS, ACUTE 05/10/2008  . ABDOMINAL PAIN, UNSPECIFIED SITE 02/16/2008  . KNEE PAIN, BILATERAL 06/17/2007  . DEPRESSIVE DISORDER 12/25/2006  . Pain in limb 12/25/2006  . Lipoma of unspecified site 09/17/2006  . Hypothyroidism 09/17/2006  . Type 2 diabetes mellitus with hyperlipidemia ([place]) 09/17/2006  . OBSTRUCTIVE SLEEP APNEA 09/17/2006  . Hypertension associated with diabetes ([place]) 09/17/2006  . BRADYCARDIA, CHRONIC 09/17/2006  . HERNIA, SITE NOS W/O OBSTRUCTION/GANGRENE 09/17/2006  . DEGENERATIVE JOINT DISEASE, HANDS 09/17/2006  . 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 CATARACT EXTRACTION W/ INTRAOCULAR LENS IMPLANT Left   . ENDARTERECTOMY Left 09/09/2019   Procedure: LEFT ILIAC ENDARTERECTOMY;  Surgeon: Serafina Mitchell, MD;  Location: [facility] OR;  Service: Vascular;  Laterality: Left;  . HERNIA REPAIR    . INSERTION OF ILIAC STENT Left 09/09/2019   Procedure: INSERTION OF LEFT ILIAC STENT;  Surgeon: Serafina Mitchell, MD;  Location: [place];  Service: Vascular;  Laterality: Left;  . PACEMAKER INSERTION    . THORACIC AORTIC ENDOVASCULAR STENT GRAFT N/A 09/09/2019   Procedure: THORACIC AORTIC ENDOVASCULAR STENT GRAFT WITH LEFT ILIAC CONDUIT;  Surgeon: Serafina Mitchell, MD;  Location: [facility] OR;  Service: Vascular;  Laterality: N/A;  . TUBAL LIGATION    . ULTRASOUND GUIDANCE FOR VASCULAR ACCESS Left 09/09/2019    Procedure: ULTRASOUND GUIDANCE FOR VASCULAR ACCESS;  Surgeon: Serafina Mitchell, MD;  Location: [place];  Service: Vascular;  Laterality: Left;  Marland Kitchen VAGINAL HYSTERECTOMY       OB History    Gravida  3   Para      Term      Preterm      AB  1   Living  2     SAB      IAB  1   Ectopic      Multiple      Live Births              Family History  Problem Relation Age of Onset  . Prostate cancer Father   . Hypertension Mother   . Heart attack Mother   . Heart disease Mother   . Kidney disease Mother 56  . Stomach cancer Son   . Diabetes Maternal Aunt   . CAD Maternal Aunt   . Stroke Maternal Aunt   . Heart disease Maternal Uncle   . CAD Maternal Uncle   . Stroke Maternal Uncle   . Colon cancer Neg Hx   . Esophageal cancer Neg Hx   . Rectal cancer Neg Hx     Social History   Tobacco Use  . Smoking status: Current Some Day Smoker    Packs/day: 0.50    Years: 30.00    Pack years: 15.00    Types: Cigarettes  . Smokeless tobacco: Never Used  Vaping Use  . Vaping Use: Never used  Substance Use Topics  . Alcohol use: No  . Drug use: No    Home Medications Prior to Admission medications   Medication Sig Start Date End Date Taking? Authorizing Provider  aspirin 81 MG tablet Take 81 mg by mouth every morning.     [provider]  carvedilol (COREG) 25 MG tablet Take 1 tablet (25 mg total) by mouth 2 (two) times daily with a meal. 10/11/19   Evans Lance, MD  cholecalciferol (VITAMIN D) 1000 UNITS tablet Take 1,000 Units by mouth every morning.     [provider]  cloNIDine (CATAPRES - DOSED IN MG/24 HR) 0.2 mg/24hr patch Place 0.2 mg onto the skin in the morning and at bedtime. Take two tablets twice daily.    [provider]  cloNIDine (CATAPRES) 0.1 MG tablet Take 1 tablet (0.1 mg total) by mouth 2 (two) times daily. 09/19/19   Pokhrel, Corrie Mckusick, MD  gabapentin (NEURONTIN) 100 MG capsule Take 1 capsule (100 mg total) by mouth 3  (three) times daily. 09/19/19   Pokhrel, Corrie Mckusick, MD  hydrALAZINE (APRESOLINE) 100 MG tablet Take 100 mg by mouth 3 (three) times daily. 09/29/19   [provider]  hydrochlorothiazide (HYDRODIURIL) 12.5 MG tablet Take 1 tablet (12.5 mg total) by mouth daily. 11/28/19   Skeet Latch, MD  levothyroxine (SYNTHROID) 25 MCG tablet Take 1 tablet (25 mcg total) by mouth daily. 09/19/19   Pokhrel, Corrie Mckusick, MD  lidocaine (LMX) 4 % cream Apply topically 4 (four) times daily as needed (left upper extremity pain). 09/19/19   Pokhrel, Corrie Mckusick, MD  melatonin 3 MG TABS tablet Take 3 tablets (9 mg total) by mouth at bedtime. 09/19/19   Pokhrel, Corrie Mckusick, MD  metFORMIN (GLUCOPHAGE) 500 MG tablet Take 500 mg by mouth 2 (two) times daily.    [provider]  Multiple Vitamin (MULTIVITAMIN) tablet Take 1 tablet by mouth every morning.     [provider]  nicotine (NICODERM CQ - DOSED IN MG/24 HR) 7 mg/24hr patch Place 1 patch (7 mg total) onto the skin daily. 09/20/19   Pokhrel, Corrie Mckusick, MD  Oxycodone HCl 10 MG TABS Take 10 mg by mouth 4 (four) times daily as needed. 08/08/19   [provider]  pantoprazole (PROTONIX) 40 MG tablet Take 1 tablet (40 mg total) by mouth daily. 09/20/19   Pokhrel, Corrie Mckusick, MD  polyethylene glycol (MIRALAX / GLYCOLAX) packet Take 17 g by mouth 2 (two) times daily.     [provider]  rosuvastatin (CRESTOR) 20 MG tablet Take 1 tablet (20 mg total) by mouth daily. 09/20/19   Pokhrel, Corrie Mckusick, MD  sertraline (ZOLOFT) 100 MG tablet Take 50 mg by mouth at bedtime.     [provider]    Allergies    Amlodipine, Lisinopril, Losartan, Codeine phosphate, Morphine and related, Naproxen, Nsaids, Penicillins, Propoxyphene n-acetaminophen, Sulfamethoxazole-trimethoprim, and Flexeril [cyclobenzaprine]  Review of Systems   Review of Systems  All other systems reviewed and are negative.   Physical Exam Updated Vital Signs BP 131/71   Pulse 64   Temp  98.3 F (36.8 C) (Oral)   Resp 18   Ht 5\' 5"  (1.651 m)   Wt 65.8 kg   SpO2 96%   BMI 24.13 kg/m   Physical Exam Vitals and nursing note reviewed.  Constitutional:      General: She is not in acute distress.    Appearance: Normal appearance. She is normal weight. She is not ill-appearing or toxic-appearing.  HENT:     Head: Normocephalic and atraumatic.     Right Ear: External ear normal.     Left Ear: External ear normal.     Nose: Nose normal.  Eyes:     Extraocular Movements: Extraocular movements intact.     Pupils: Pupils are equal, round, and reactive to light.     Comments: Sclera of the right eye is injected consistent with her history of surgery recently.  Cardiovascular:     Rate and Rhythm: Normal rate and regular rhythm.     Pulses: Normal pulses.     Heart sounds: Normal heart sounds.  Pulmonary:     Effort: Pulmonary effort is normal. No respiratory distress.     Breath sounds: Normal breath sounds.  Musculoskeletal:     Cervical back: Normal range of motion.  Skin:    General: Skin is warm and dry.  Neurological:     General: No focal deficit present.     Mental Status: She is alert and oriented to person, place, and time.     Cranial Nerves: No cranial nerve deficit.     Comments: Patient has some difficulty lifting her left leg up off the stretcher.  She is able to flex her knee.  She does not appear to have any weakness of her upper extremities.  Grips  are equal.  No pronator drift.  Psychiatric:        Mood and Affect: Mood normal.        Behavior: Behavior normal.        Thought Content: Thought content normal.     ED Results / Procedures / Treatments   Labs (all labs ordered are listed, but only abnormal results are displayed)   EKG EKG Interpretation  Date/Time:  Saturday January 28 2020 23:30:21 EST Ventricular Rate:  65 PR Interval:    QRS Duration: 110 QT Interval:  429 QTC Calculation: 447 R Axis:   10 Text  Interpretation: Atrial-paced rhythm LVH with secondary repolarization abnormality Anterior Q waves, possibly due to LVH Confirmed by Rolland Porter [phone]) on 01/28/2020 11:33:57 PM   Radiology   CTA of chest/abdomen/pelvis January 29, 2020 IMPRESSION: 1. Postsurgical changes after thoracic endograft repair of descending thoracic aorta. Graft is patent and unchanged in position. No evidence of acute dissection, acute aortic hematoma, or perigraft inflammation. 2. Left proximal external iliac stent in place. Again seen extraluminal contrast along the distal aspect of the stent that has slightly increased in size from prior exam, pseudoaneurysm versus postsurgical change. No rupture or inflammation. 3. Septal thickening suspicious for pulmonary edema. Mild cardiomegaly. 4. Equivocal gastric wall thickening about the cardia, can be seen with gastritis. 5. Large volume of stool throughout the colon with colonic tortuosity, suggesting constipation.  Aortic Atherosclerosis (ICD10-I70.0) and Emphysema (ICD10-J43.9). Electronically Signed By: Keith Rake M.D. On: 01/29/2020 02:52   DG Chest 1 View  Result Date: 01/29/2020 CLINICAL DATA:  Left-sided chest pain. EXAM: CHEST  1 VIEW COMPARISON:  Radiograph 09/15/2019. Most recent chest CTA 12/06/2019 FINDINGS: Right-sided pacemaker in place. Stable cardiomegaly. Unchanged mediastinal contours as well as descending thoracic aortic stent graft. New interstitial coarsening and septal thickening suspicious for pulmonary edema. No significant pleural fluid. No focal airspace disease. No pneumothorax. Stable osseous structures. IMPRESSION: New interstitial coarsening and septal thickening suspicious for pulmonary edema. Stable cardiomegaly. Electronically Signed   By: Keith Rake M.D.   On: 01/29/2020 01:26   CT HEAD CODE STROKE WO CONTRAST  Result Date: 01/29/2020 CLINICAL DATA:  Code stroke. Initial evaluation for acute left lower extremity  numbness. EXAM: CT HEAD WITHOUT CONTRAST TECHNIQUE: Contiguous axial images were obtained from the base of the skull through the vertex without intravenous contrast. COMPARISON:  Prior CT from 02/15/2005 FINDINGS: Brain: Cerebral volume within normal limits for age. Mild chronic microvascular ischemic disease. No acute intracranial hemorrhage. No acute large vessel territory infarct. No mass lesion, midline shift or mass effect. No hydrocephalus or extra-axial fluid collection. Vascular: No hyperdense vessel. Scattered vascular calcifications noted within the carotid siphons. Skull: Scalp soft tissues within normal limits. No calvarial fracture. Sinuses/Orbits: Globes and orbital soft tissues within normal limits. Paranasal sinuses are clear. No significant mastoid effusion. Other: None. ASPECTS [facility] Stroke Program Early CT Score) - Ganglionic level infarction (caudate, lentiform nuclei, internal capsule, insula, M1-M3 cortex): 7 - Supraganglionic infarction (M4-M6 cortex): 3 Total score (0-10 with 10 being normal): 10 IMPRESSION: 1. No acute intracranial infarct or other abnormality. 2. ASPECTS is 10. 3. Mild chronic microvascular ischemic disease. Results were called by telephone at the time of interpretation on 01/29/2020 at 12:49 am to provider Dawanda Mapel , who verbally acknowledged these results. Electronically Signed   By: Jeannine Boga M.D.   On: 01/29/2020 00:49    Procedures .Critical Care Performed by: Rolland Porter, MD Authorized by: Rolland Porter, MD  Critical care provider statement:    Critical care time (minutes):  42   Critical care was necessary to treat or prevent imminent or life-threatening deterioration of the following conditions:  CNS failure or compromise   Critical care was time spent personally by me on the following activities:  Discussions with consultants, examination of patient, obtaining history from patient or surrogate, ordering and review of laboratory studies,  ordering and review of radiographic studies, pulse oximetry, re-evaluation of patient's condition and review of old charts   Care discussed with: admitting provider     (including critical care time)  Medications Ordered in ED Medications   stroke: mapping our early stages of recovery book (0 each Does not apply Hold 01/29/20 0344)  acetaminophen (TYLENOL) tablet 650 mg (has no administration in time range)    Or  acetaminophen (TYLENOL) suppository 650 mg (has no administration in time range)  ondansetron (ZOFRAN) tablet 4 mg (has no administration in time range)    Or  ondansetron (ZOFRAN) injection 4 mg (has no administration in time range)  LORazepam (ATIVAN) injection 0.5 mg (has no administration in time range)  insulin aspart (novoLOG) injection 0-15 Units (has no administration in time range)  lactated ringers infusion (has no administration in time range)  potassium chloride SA (KLOR-CON) CR tablet 20 mEq (has no administration in time range)  iohexol (OMNIPAQUE) 350 MG/ML injection 75 mL (75 mLs Intravenous Contrast Given 01/29/20 0109)  lactated ringers bolus 500 mL (0 mLs Intravenous Stopped 01/29/20 0338)    ED Course  I have reviewed the triage vital signs and the nursing notes.  Pertinent labs & imaging results that were available during my care of the patient were reviewed by me and considered in my medical decision making (see chart for details).    MDM Rules/Calculators/A&P                           Patient started on new eyedrops after cataract surgery yesterday presents with mild episode of hypotension, during my exam her blood pressure was 113/70 and new left sided lower extremity numbness and tingling and possible mild weakness.  She did not bring her new eyedrops.  I wonder if there is a beta-blocker in the drops because she has paced rhythm on her EKG and that could explain her hypotension.  Code stroke was called.  12:48 AM radiology called CT head result which was  no acute findings  12:54 AM Dr Gordy Levan Teleneurology, is evaluating patient. She wants to get a CTA of chest and abdomen due to patient's hx of aortic dissection.   The radiology tech asked me if we wanted to go ahead and do the CTA before her labs resulted.  When I look at her labs her most recent electrolytes were done on November 16 and her BUN was 25 creatinine 1.28 with GFR of 45.  In August however her creatinines were higher at 2.57 on August 27.  Based on the most recent 1 being more normal we went ahead and gave her the contrast at lower dose since the scan was needed.  01:38 AM Dr. Gordy Levan called back.  She is talked to the patient and they do not feel the risk of giving tPA outweighs the benefits due to her recent eye surgery.  She did not feel like we should do a CTA of her brain or neck at this time.  She states if her CTA is negative she would  recommend putting her on dual antiplatelet therapy with aspirin and Plavix for 21 days.  She wants her hydrated, she feels like patient should have MRI and MRA done instead of CTA of her head and neck.  2:41 AM Dr. Olevia Bowens, hospitalist will admit patient.  We are still waiting for her CTA to be read to make sure she does not need any other emergent treatment.  We discussed giving her IV fluids, he wants me to order a 500 cc bolus of LR and he will order her infusion.  2:52 AM Dr. Gordy Levan called back trying to see if this CTA had been read yet and it has not.  I was able to find patient CTA report in [place] website.  It is without acute changes.  03:35 Dr Gordy Levan called back has reviewed her CTA results, hold off on the Plavix and aspirin today until talk to Vascular Surgery about her scan later today.  Dr. Olevia Bowens was notified of her recommendations.  Final Clinical Impression(s) / ED Diagnoses Final diagnoses:  Numbness and tingling of left leg  Chest pain, unspecified type  AKI (acute kidney injury) [facility])    Rx / DC Orders  Plan admission  Rolland Porter,  MD, Barbette Or, MD 01/29/20 [place], [place], MD 01/29/20 [phone]

## 2020-01-29 NOTE — Evaluation (Signed)
Physical Therapy Evaluation Patient Details Name: [name] MRN: [id number] DOB: [birth year] Today's Date: 01/29/2020   History of Present Illness  Patient states she had cataract surgery on her right eye done yesterday.  She was started on new eyedrops which she is taking at breakfast, lunch, and dinner.  She states her neighbor came over and put them in her right eye at 9:30 PM.  At 10 PM she checks her blood pressure which she normally does every day.  She states her blood pressure is usually around 147/81.  However tonight when she checked it it was 105/56.  She checked it again a little bit later and it was 87/49.  She states about the same time she started feeling lightheaded and had some left sided numbness and tingling of her lower leg to just above her knee.  She denies any involvement of her face or her left arm.  She denies any nausea, vomiting, diarrhea, or headache.  She states she was able to walk despite the numbness.  She denies any prior history of stroke.  She also states that after she got into the ambulance she started having left-sided chest pressure.  She states she had 2 stents placed in August and the pain tonight was not as bad as that but similar.  She denies any shortness of breath or diaphoresis tonight.   Clinical Impression   Patient sitting up in bed at start of session and very agreeable to therapy despite being in pain. Strength WNL in bilateral lower extremities and patient able to perform bed mobilities and transfers with modified independence. Pain in right lower extremity (chronic pain due to sciatica) was limiting factor during session. Able to walk about 10  Feet with RW before pain was too intense and patient requested to go back to bed. At baseline patient does not use AD but felt she needed it today because of her pain levels. Notified nursing about patient's request for pain meds, anticipate patient able to walk without AD once pain is managed.  Patient  will continue to benefit from skilled physical therapy in hospital and recommended venue below to increase strength, balance, endurance for safe ADLs and gait.   Follow Up Recommendations Home health PT    Equipment Recommendations       Recommendations for Other Services       Precautions / Restrictions        Mobility  Bed Mobility Overal bed mobility: Independent                  Transfers Overall transfer level: Modified independent Equipment used: Rolling walker (2 wheeled)             General transfer comment: Transfered from bed to standing and to commode with modified independence and SBA. No loss of balance of weakness noted with functional tasks  Ambulation/Gait Ambulation/Gait assistance: Supervision;Modified independent (Device/Increase time) Gait Distance (Feet): 10 Feet Assistive device: Rolling walker (2 wheeled) Gait Pattern/deviations: Decreased step length - right;Decreased step length - left;Decreased stance time - right Gait velocity: decreased      Stairs            Wheelchair Mobility    Modified Rankin (Stroke Patients Only)       Balance Overall balance assessment: Modified Independent  Pertinent Vitals/Pain Pain Assessment: 0-10 Pain Score: 9  Pain Location: right leg and back Pain Descriptors / Indicators:  (intense) Pain Intervention(s): Patient requesting pain meds-RN notified;Limited activity within patient's tolerance    Home Living Family/patient expects to be discharged to:: Private residence Living Arrangements: Alone Available Help at Discharge: Neighbor;Available PRN/intermittently Type of Home: Mobile home Home Access: Ramped entrance     Home Layout: One level Home Equipment: [place] - 2 wheels;Wheelchair - manual      Prior Function Level of Independence: Independent               Hand Dominance        Extremity/Trunk  Assessment        Lower Extremity Assessment Lower Extremity Assessment: Overall WFL for tasks assessed       Communication   Communication: No difficulties  Cognition Arousal/Alertness: Awake/alert                                            General Comments      Exercises     Assessment/Plan    PT Assessment Patient needs continued PT services  PT Problem List Decreased activity tolerance;Decreased mobility;Pain       PT Treatment Interventions Gait training;Therapeutic activities;Therapeutic exercise;Neuromuscular re-education;Patient/family education    PT Goals (Current goals can be found in the Care Plan section)  Acute Rehab PT Goals Patient Stated Goal: to go home PT Goal Formulation: With patient Time For Goal Achievement: 02/12/20 Potential to Achieve Goals: Good    Frequency Min 3X/week   Barriers to discharge        Co-evaluation               AM-PAC PT "6 Clicks" Mobility  Outcome Measure Help needed turning from your back to your side while in a flat bed without using bedrails?: None Help needed moving from lying on your back to sitting on the side of a flat bed without using bedrails?: None Help needed moving to and from a bed to a chair (including a wheelchair)?: None Help needed standing up from a chair using your arms (e.g., wheelchair or bedside chair)?: None Help needed to walk in hospital room?: A Little Help needed climbing 3-5 steps with a railing? : A Little 6 Click Score: 22    End of Session Equipment Utilized During Treatment: Gait belt Activity Tolerance: Patient limited by pain Patient left: in bed;with call bell/phone within reach Nurse Communication: Mobility status;Patient requests pain meds PT Visit Diagnosis: Unsteadiness on feet (R26.81);Pain;Difficulty in walking, not elsewhere classified (R26.2) Pain - Right/Left: Right Pain - part of body: Leg    Time: 3235-5732 PT Time Calculation (min)  (ACUTE ONLY): 29 min   Charges:   PT Evaluation $PT Eval Low Complexity: 1 Low PT Treatments $Therapeutic Activity: 8-22 mins       11:27 AM, 01/29/20 Jerene Pitch, DPT Physical Therapy with [facility]  [phone] office

## 2020-01-29 NOTE — Care Management Obs Status (Signed)
[place] NOTIFICATION   Patient Details  Name: [name] MRN: [id number] Date of Birth: [birth year]   Medicare Observation Status Notification Given:  Yes    Natasha Bence, LCSW 01/29/2020, 5:08 PM

## 2020-01-29 NOTE — ED Notes (Signed)
Paged md to talk about bp, talked with hospitalist, he states that he will put in orders to give 10 of iv hydralazine and 5 of oxycodone and change her oxy dose to 10mg  to mirror her home meds.

## 2020-01-29 NOTE — ED Notes (Signed)
Pt returned from CT Scan 

## 2020-01-29 NOTE — ED Notes (Signed)
Pt to CT

## 2020-01-29 NOTE — ED Notes (Signed)
Teleneurologist Gill speaking with Pt at bedside regarding risks and benefits of tPa. Both Pt and Teleneurologist in agreement tPa is not in best interest at this time.

## 2020-01-30 ENCOUNTER — Observation Stay (HOSPITAL_COMMUNITY): Payer: Medicare Other

## 2020-01-30 DIAGNOSIS — E1169 Type 2 diabetes mellitus with other specified complication: Secondary | ICD-10-CM

## 2020-01-30 DIAGNOSIS — R2990 Unspecified symptoms and signs involving the nervous system: Secondary | ICD-10-CM | POA: Diagnosis present

## 2020-01-30 DIAGNOSIS — I480 Paroxysmal atrial fibrillation: Secondary | ICD-10-CM

## 2020-01-30 DIAGNOSIS — E1159 Type 2 diabetes mellitus with other circulatory complications: Secondary | ICD-10-CM

## 2020-01-30 DIAGNOSIS — R200 Anesthesia of skin: Secondary | ICD-10-CM | POA: Diagnosis not present

## 2020-01-30 DIAGNOSIS — E876 Hypokalemia: Secondary | ICD-10-CM | POA: Diagnosis not present

## 2020-01-30 DIAGNOSIS — E032 Hypothyroidism due to medicaments and other exogenous substances: Secondary | ICD-10-CM

## 2020-01-30 DIAGNOSIS — I152 Hypertension secondary to endocrine disorders: Secondary | ICD-10-CM

## 2020-01-30 DIAGNOSIS — E785 Hyperlipidemia, unspecified: Secondary | ICD-10-CM

## 2020-01-30 DIAGNOSIS — U071 COVID-19: Secondary | ICD-10-CM

## 2020-01-30 LAB — CBG MONITORING, ED: Glucose-Capillary: 101 mg/dL — ABNORMAL HIGH (ref 70–99)

## 2020-01-30 LAB — SARS CORONAVIRUS 2 (TAT 6-24 HRS): SARS Coronavirus 2: POSITIVE — AB

## 2020-01-30 MED ORDER — LABETALOL HCL 5 MG/ML IV SOLN
10.0000 mg | Freq: Once | INTRAVENOUS | Status: AC
  Administered 2020-01-30: 10 mg via INTRAVENOUS
  Filled 2020-01-30: qty 4

## 2020-01-30 MED ORDER — CLONIDINE HCL 0.2 MG PO TABS: 0.2000 mg | ORAL_TABLET | Freq: Three times a day (TID) | ORAL | 2 refills | Status: DC

## 2020-01-30 MED ORDER — IOHEXOL 350 MG/ML SOLN: 75.0000 mL | Freq: Once | INTRAVENOUS | Status: DC | PRN

## 2020-01-30 MED ORDER — CLONIDINE HCL 0.2 MG PO TABS: 0.2000 mg | ORAL_TABLET | Freq: Three times a day (TID) | ORAL | Status: DC

## 2020-01-30 MED ORDER — GLIMEPIRIDE 1 MG PO TABS: 1.0000 mg | ORAL_TABLET | ORAL | 3 refills | Status: AC

## 2020-01-30 MED ORDER — CLOPIDOGREL BISULFATE 75 MG PO TABS: 75.0000 mg | ORAL_TABLET | Freq: Every day | ORAL | 3 refills | Status: AC

## 2020-01-30 NOTE — Progress Notes (Signed)
SLP Cancellation Note  Patient Details Name: [name] MRN: [id number] DOB: 1950/06/12   Cancelled treatment:       Reason Eval/Treat Not Completed: SLP screened, no needs identified, will sign off; SLP screened Pt in room. Pt denies any changes in swallowing, speech, language, or cognition. CT negative for acute changes. SLE will be deferred at this time. Reconsult if indicated. SLP will sign off. Pt now COVID +  Thank you,  Genene Churn, [place]  [place] 01/30/2020, 1:12 PM

## 2020-01-30 NOTE — ED Notes (Signed)
Pt assisted to the bathroom & given a sprite to drink.

## 2020-01-30 NOTE — TOC Transition Note (Signed)
Transition of Care [facility]) - CM/SW Discharge Note   Patient Details  Name: [name] MRN: [id number] Date of Birth: October 26, 1950  [facility] of [facility] CM/SW Contact:  Boneta Lucks, RN Phone Number: 01/30/2020, 1:19 PM   Clinical Narrative:   Patient admitted in OBS with numbness and tingling also found to be COVID +. Patient is stable and will discharge home from ED. PT is recommending HHPT, Patient was active with [facility]. [name] updated and order will be placed. Added to AVS.   Final next level of care: [place] Barriers to Discharge: Barriers Resolved   Patient Goals and CMS Choice Patient states their goals for this hospitalization and ongoing recovery are:: to go home. CMS Medicare.gov Compare Post Acute Care list provided to:: Patient Choice offered to / list presented to : Patient  Discharge Placement            Patient and family notified of of transfer: 01/30/20  Discharge Plan and Services    HH Arranged: PT [facility] Agency: [facility] Date [place]: 01/30/20 Time [place]: [id number] Representative spoke with at [place]: [name]

## 2020-01-30 NOTE — Discharge Summary (Signed)
Physician Discharge Summary  [name] [id number] DOB: 06/16/1950 DOA: 01/28/2020  PCP: Antonietta Jewel, MD  Admit date: 01/28/2020 Discharge date: 01/30/2020  Time spent: 35 minutes  Recommendations for Outpatient Follow-up:  1. Repeat basic viral panel to follow electrolytes and renal function 2. Reassess blood pressure and adjust antihypertensive regimen as needed 3. Repeat CBC to follow hemoglobin trend 4. Reassess CBGs and further adjust hypoglycemic regimen as required; due to renal failure Metformin discontinued discharge.   Discharge Diagnoses:  Principal Problem:   Numbness and tingling of left lower extremity Active Problems:   Hypothyroidism   Type 2 diabetes mellitus with hyperlipidemia (HCC)   Hypertension associated with diabetes (HCC)   Hypokalemia   COPD (chronic obstructive pulmonary disease) (HCC)   AKI (acute kidney injury) on chronic kidney disease a stage IIIb.   Paroxysmal atrial fibrillation (HCC)   Stroke-like symptoms   Discharge Condition: Stable and improved.  Patient discharged home with home health physical therapy for further rehabilitation and conditioning.  She will follow up with PCP in 10 days and follow-up with neurology service in 4 weeks.  CODE STATUS: Full code.  Diet recommendation: Modified carbohydrates and heart healthy diet.  Filed Weights   01/28/20 2324  Weight: 65.8 kg    History of present illness:  As per H&P written by Dr. Olevia Bowens on 01/29/2020 [name] is a 70 y.o. female with medical history significant of anemia, anxiety, depression, osteoarthritis, transient atrial fibrillation/flutter, COPD, type 2 diabetes, diverticulosis, DJD, GERD, gout, history of pneumonia, hypertension, hypothyroidism, internal hemorrhoids, who had cataract surgery on her right eye on Friday and is coming to the emergency department with complaints of low blood pressure after she started some new eyedrops that she has been taking 3  times a day.  The patient states that a neighbor helped her with her eyedrops around 2130 and then she felt lightheaded with her blood pressure being 105/56 and then 87/49 mmHg.  She states that she has a blood pressure usually in the 130s and 140s.  She mentions that shortly after this she started feeling left-sided lower extremity numbness and tingling sensation.  She denies L UE or facial weakness.  She denies headache, nausea, emesis, diaphoresis, palpitations or recent pitting edema of the lower extremities.  Asked the patient was getting in the ambulance she started having left-sided chest pressure, which has subsided since then.  She denies fever, chills, sore throat rhinorrhea.  No wheezing or hemoptysis.  Denies abdominal pain, diarrhea, constipation, melena or hematochezia.  Denies dysuria, frequency or hematuria.  Denies polyuria, polydipsia, polyphagia or blurred vision.  ED Course: 98.3 F, pulse 65, respiration 19, BP 122/74 mmHg O2 sat 100% on room air.  The patient was given a 500 mL LR bolus.  Hospital Course:  Left lower extremity numbness/tingling -Patient with underlying history of paroxysmal atrial fibrillation not on anticoagulation. -Was chronically using a baby aspirin on daily basis. -CT scan demonstrated no acute intracranial abnormalities; repeat scan 40 hours apart still without acute changes. -Unable to perform CT angiogram with contrast due to patient's renal function status; unable to pursue MRI due to pacemaker implantation not compatible with machine. -Case discussed with neurology service who recommended the use of dual antiplatelet therapy and continue risk factor modifications. -Patient discharge on aspirin and Plavix. -Outpatient follow-up in 4 weeks with neurology service -Less than 50% plaque buildup appreciated on carotid Dopplers; no significant normalities seen on 2D echo.  Acute on chronic renal failure: Stage IIIb  at baseline -We will continue holding  nephrotoxic agents at discharge -Patient advised to maintain adequate hydration -Avoid hypotension -Repeat basic metabolic panel follow-up visit to assess stability.  Hypothyroidism -Continue Synthroid.  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 -Low-dose glimepiride has been ordered. -Patient advised to follow modified carbohydrate diet and to check her CBGs. -Further hypoglycemic regimen adjustment to be made by her PCP at follow-up visit based on CBGs fluctuation.  Hypertension -Resume home antihypertensive agents -Advised to follow low-sodium diet -During his acute hospitalization permissive hypertension was accepted in the setting of TIA symptoms.  Paroxysmal atrial fibrillation -CHA2DS2-VASc score of at least 8 -Not on anticoagulation -Discharged on dual antiplatelet therapy -Rate controlled.  History of COPD -Continue as needed bronchodilators.  Hypokalemia -Repleted and within normal limits at discharge.  Covid infection -During admission screening patient was found to be positive for SARS Covid PCR -She was completely asymptomatic -Chest x-ray without acute abnormalities -Good oxygen saturation on room air. -Patient did not qualify for treatment with remdesivir or steroids at this time   Procedures:  See below for x-ray reports  Consultations:  Neurology service  Discharge Exam: Vitals:   01/30/20 1135 01/30/20 1230  BP: (!) 227/95 (!) 161/118  Pulse: 77 61  Resp: 14 14  Temp:    SpO2: 100% 100%    General: Afebrile, no chest pain, no nausea, no vomiting.  Good oxygen saturation on room air and in no acute distress.  Reports improvement in her overall symptoms and feel ready to go home. Cardiovascular: Rate controlled; no JVD, no rubs or gallops. Respiratory: Good air movement bilaterally, no wheezing, no crackles, no using  accessory muscles. Abdomen: Soft, nontender, distended, positive bowel sounds Extremities: No cyanosis or clubbing.  Discharge Instructions   Discharge Instructions    Diet - low sodium heart healthy   Complete by: As directed    Diet Carb Modified   Complete by: As directed    Discharge instructions   Complete by: As directed    Take medications as prescribed Arrange follow up with PCP in 10 days Follow up with neurology in 4 weeks Follow up with vascular surgery as instructed (office will contact you with appointment details).  Maintain adequate hydration and follow heart healthy and modified carb diet.     Allergies as of 01/30/2020      Reactions   Amlodipine Swelling   Started very close to episode of angioedema to ACE, unknown if this added effects or just residual   Lisinopril    angioedema   Losartan Swelling   Lip swelling   Codeine Phosphate Other (See Comments)   REACTION: UNKNOWN   Morphine And Related Other (See Comments)   Cannot take:Heart problems   Naproxen Nausea And Vomiting   Nsaids Nausea Only   Penicillins    UNKNOWN REACTION   Propoxyphene N-acetaminophen Other (See Comments)   Makes me gag   Sulfamethoxazole-trimethoprim Other (See Comments)   Gi upset   Flexeril [cyclobenzaprine] Anxiety, Other (See Comments)   Pt states medication gives her nightmares and insomnia.      Medication List    STOP taking these medications   cloNIDine 0.2 mg/24hr patch Commonly known as: CATAPRES - Dosed in mg/24 hr   hydrochlorothiazide 12.5 MG tablet Commonly known as: HYDRODIURIL   lovastatin 20 MG tablet Commonly known as: MEVACOR   metFORMIN 500 MG tablet Commonly known as:  GLUCOPHAGE     TAKE these medications   aspirin 81 MG tablet Take 81 mg by mouth every morning.   carvedilol 25 MG tablet Commonly known as: Coreg Take 1 tablet (25 mg total) by mouth 2 (two) times daily with a meal.   cholecalciferol 1000 units tablet Commonly known  as: VITAMIN D Take 1,000 Units by mouth every morning.   cloNIDine 0.2 MG tablet Commonly known as: CATAPRES Take 1 tablet (0.2 mg total) by mouth 3 (three) times daily. What changed: Another medication with the same name was removed. Continue taking this medication, and follow the directions you see here.   clopidogrel 75 MG tablet Commonly known as: Plavix Take 1 tablet (75 mg total) by mouth daily.   gabapentin 100 MG capsule Commonly known as: NEURONTIN Take 1 capsule (100 mg total) by mouth 3 (three) times daily.   glimepiride 1 MG tablet Commonly known as: Amaryl Take 1 tablet (1 mg total) by mouth every morning.   hydrALAZINE 100 MG tablet Commonly known as: APRESOLINE Take 100 mg by mouth 3 (three) times daily.   levothyroxine 25 MCG tablet Commonly known as: SYNTHROID Take 1 tablet (25 mcg total) by mouth daily. What changed: Another medication with the same name was removed. Continue taking this medication, and follow the directions you see here.   lidocaine 4 % cream Commonly known as: LMX Apply topically 4 (four) times daily as needed (left upper extremity pain).   melatonin 3 MG Tabs tablet Take 3 tablets (9 mg total) by mouth at bedtime.   multivitamin tablet Take 1 tablet by mouth every morning.   nicotine 7 mg/24hr patch Commonly known as: NICODERM CQ - dosed in mg/24 hr Place 1 patch (7 mg total) onto the skin daily.   Oxycodone HCl 10 MG Tabs Take 10 mg by mouth 4 (four) times daily as needed.   pantoprazole 40 MG tablet Commonly known as: PROTONIX Take 1 tablet (40 mg total) by mouth daily.   polyethylene glycol 17 g packet Commonly known as: MIRALAX / GLYCOLAX Take 17 g by mouth 2 (two) times daily.   rosuvastatin 20 MG tablet Commonly known as: CRESTOR Take 1 tablet (20 mg total) by mouth daily.   sertraline 100 MG tablet Commonly known as: ZOLOFT Take 50 mg by mouth at bedtime.      Allergies  Allergen Reactions  . Amlodipine  Swelling    Started very close to episode of angioedema to ACE, unknown if this added effects or just residual  . Lisinopril     angioedema  . Losartan Swelling    Lip swelling   . Codeine Phosphate Other (See Comments)    REACTION: UNKNOWN  . Morphine And Related Other (See Comments)    Cannot take:Heart problems  . Naproxen Nausea And Vomiting  . Nsaids Nausea Only  . Penicillins     UNKNOWN REACTION  . Propoxyphene N-Acetaminophen Other (See Comments)    Makes me gag  . Sulfamethoxazole-Trimethoprim Other (See Comments)    Gi upset  . Flexeril [Cyclobenzaprine] Anxiety and Other (See Comments)    Pt states medication gives her nightmares and insomnia.    Follow-up Information    Antonietta Jewel, MD. Schedule an appointment as soon as possible for a visit in 10 day(s).   Specialty: Internal Medicine Contact information: [address], [address] [place] [address] [phone]        Phillips Odor, MD. Schedule an appointment as soon as possible for a  visit in 4 week(s).   Specialty: Neurology Contact information: [address] [place] [address] [phone]        [facility] Follow up.   Specialty: Home Health Services Why:  PT- they will call within 48 hours to set up first appointment. Contact information: [address] [place] [phone]                The results of significant diagnostics from this hospitalization (including imaging, microbiology, ancillary and laboratory) are listed below for reference.    Significant Diagnostic Studies: DG Chest 1 View  Result Date: 01/29/2020 CLINICAL DATA:  Left-sided chest pain. EXAM: CHEST  1 VIEW COMPARISON:  Radiograph 09/15/2019. Most recent chest CTA 12/06/2019 FINDINGS: Right-sided pacemaker in place. Stable cardiomegaly. Unchanged mediastinal contours as well as descending thoracic aortic stent graft. New interstitial coarsening and septal thickening suspicious for  pulmonary edema. No significant pleural fluid. No focal airspace disease. No pneumothorax. Stable osseous structures. IMPRESSION: New interstitial coarsening and septal thickening suspicious for pulmonary edema. Stable cardiomegaly. Electronically Signed   By: Keith Rake M.D.   On: 01/29/2020 01:26   CT HEAD WO CONTRAST  Result Date: 01/30/2020 CLINICAL DATA:  Follow-up code stroke. Left-sided numbness and weakness. EXAM: CT HEAD WITHOUT CONTRAST TECHNIQUE: Contiguous axial images were obtained from the base of the skull through the vertex without intravenous contrast. COMPARISON:  CT head 01/29/2020 FINDINGS: Brain: Ventricle size and cerebral volume normal. Mild white matter changes, stable. Negative for acute infarct, hemorrhage, mass Vascular: Negative for hyperdense vessel Skull: Negative Sinuses/Orbits: Paranasal sinuses clear. Bilateral cataract extraction Other: None IMPRESSION: No acute abnormality and no change from yesterday. Electronically Signed   By: Franchot Gallo M.D.   On: 01/30/2020 09:59   US Carotid Bilateral (at [facility] and [facility] only)  Result Date: 01/29/2020 CLINICAL DATA:  Hypertension, diabetes EXAM: BILATERAL CAROTID DUPLEX ULTRASOUND TECHNIQUE: Pearline Cables scale imaging, color Doppler and duplex ultrasound were performed of bilateral carotid and vertebral arteries in the neck. COMPARISON:  None. FINDINGS: Criteria: Quantification of carotid stenosis is based on velocity parameters that correlate the residual internal carotid diameter with NASCET-based stenosis levels, using the diameter of the distal internal carotid lumen as the denominator for stenosis measurement. The following velocity measurements were obtained: RIGHT ICA: 97/33 cm/sec CCA: [id number] cm/sec SYSTOLIC ICA/CCA RATIO:  1.2 ECA: 79 cm/sec LEFT ICA: 93/27 cm/sec CCA: [id number] cm/sec SYSTOLIC ICA/CCA RATIO:  1.6 ECA: 138 cm/sec RIGHT CAROTID ARTERY: Moderate calcified bifurcation atherosclerosis. Despite this, no hemodynamically  significant right ICA stenosis, velocity elevation, or turbulent flow. Degree of narrowing less than 50% by ultrasound criteria. RIGHT VERTEBRAL ARTERY:  Normal antegrade flow LEFT CAROTID ARTERY: Intimal thickening and moderate bifurcation atherosclerosis. No hemodynamically significant left ICA stenosis, velocity elevation or turbulent flow. Degree of narrowing also less than 50% by ultrasound criteria. LEFT VERTEBRAL ARTERY:  Normal antegrade flow IMPRESSION: Moderate bilateral carotid atherosclerosis. No hemodynamically significant ICA stenosis. Degree of narrowing less than 50% bilaterally by ultrasound criteria. Patent antegrade vertebral flow bilaterally Electronically Signed   By: Jerilynn Mages.  Shick M.D.   On: 01/29/2020 10:29   ECHOCARDIOGRAM COMPLETE  Result Date: 01/29/2020    ECHOCARDIOGRAM REPORT   Patient Name:   ITZELL PAULMAN [facility] Date of Exam: 01/29/2020 Medical Rec #:  [id number]           Height:       65.0 in Accession #:    [id number]  Weight:       145.0 lb Date of Birth:  1950-06-04          BSA:          1.725 m Patient Age:    6 years            BP:           161/92 mmHg Patient Gender: F                   HR:           60 bpm. Exam Location:  [facility] Procedure: 2D Echo Indications:    TIA 435.9 / G45.9  History:        Patient has prior history of Echocardiogram examinations, most                 recent 08/29/2019. Pacemaker, COPD, Arrythmias:Atrial                 Fibrillation; Risk Factors:Diabetes, Current Smoker and                 Hypertension. Dissection of thoracic aorta.  Sonographer:    Leavy Cella RDCS (AE) Referring Phys: [id number] DAVID MANUEL [place]  1. Basal inferior hypokinesis/akinesis. . Left ventricular ejection fraction, by estimation, is 60 to 65%. The left ventricle has normal function. There is moderate left ventricular hypertrophy. Left ventricular diastolic parameters are indeterminate.  2. Right ventricular systolic function is normal. The right  ventricular size is normal.  3. The mitral valve is abnormal. Trivial mitral valve regurgitation. Moderate mitral annular calcification.  4. AV is thickened, calcified with very mildly restricted motion. . The aortic valve is tricuspid. Aortic valve regurgitation is not visualized.  5. The inferior vena cava is dilated in size with >50% respiratory variability, suggesting right atrial pressure of 8 mmHg. FINDINGS  Left Ventricle: Basal inferior hypokinesis/akinesis. Left ventricular ejection fraction, by estimation, is 60 to 65%. The left ventricle has normal function. The left ventricular internal cavity size was normal in size. There is moderate left ventricular hypertrophy. Left ventricular diastolic parameters are indeterminate. Right Ventricle: The right ventricular size is normal. Right vetricular wall thickness was not assessed. Right ventricular systolic function is normal. Left Atrium: Left atrial size was normal in size. Right Atrium: Right atrial size was normal in size. Pericardium: There is no evidence of pericardial effusion. Mitral Valve: The mitral valve is abnormal. There is mild thickening of the mitral valve leaflet(s). Moderate mitral annular calcification. Trivial mitral valve regurgitation. Tricuspid Valve: The tricuspid valve is normal in structure. Tricuspid valve regurgitation is mild. Aortic Valve: AV is thickened, calcified with very mildly restricted motion. The aortic valve is tricuspid. Aortic valve regurgitation is not visualized. Pulmonic Valve: The pulmonic valve was not well visualized. Pulmonic valve regurgitation is mild. Aorta: The aortic root is normal in size and structure. Venous: The inferior vena cava is dilated in size with greater than 50% respiratory variability, suggesting right atrial pressure of 8 mmHg. IAS/Shunts: The interatrial septum was not assessed. Additional Comments: A pacer wire is visualized.  LEFT VENTRICLE PLAX 2D LVIDd:         4.21 cm  Diastology LVIDs:          2.69 cm  LV e' medial:    5.77 cm/s LV PW:         1.58 cm  LV E/e' medial:  14.9 LV IVS:        1.61  cm  LV e' lateral:   5.55 cm/s LVOT diam:     2.00 cm  LV E/e' lateral: 15.5 LVOT Area:     3.14 cm  RIGHT VENTRICLE RV S prime:     14.40 cm/s TAPSE (M-mode): 3.3 cm LEFT ATRIUM             Index       RIGHT ATRIUM           Index LA diam:        3.20 cm 1.85 cm/m  RA Area:     20.20 cm LA Vol (A2C):   31.7 ml 18.37 ml/m RA Volume:   62.80 ml  36.40 ml/m LA Vol (A4C):   57.2 ml 33.15 ml/m LA Biplane Vol: 46.3 ml 26.83 ml/m   AORTA Ao Root diam: 2.60 cm MITRAL VALVE               TRICUSPID VALVE MV Area (PHT): 2.51 cm    TR Peak grad:   26.2 mmHg MV Decel Time: 302 msec    TR Vmax:        256.00 cm/s MV E velocity: 85.90 cm/s MV A velocity: 87.00 cm/s  SHUNTS MV E/A ratio:  0.99        Systemic Diam: 2.00 cm Dorris Carnes MD Electronically signed by Dorris Carnes MD Signature Date/Time: 01/29/2020/2:09:16 PM    Final    CT Angio Chest/Abd/Pel for Dissection W and/or W/WO  Result Date: 01/29/2020 CLINICAL DATA:  Left arm weakness. Previous aortic hematoma post endograft treatment. EXAM: CT ANGIOGRAPHY CHEST, ABDOMEN AND PELVIS TECHNIQUE: Non-contrast CT of the chest was initially obtained. Multidetector CT imaging through the chest, abdomen and pelvis was performed using the standard protocol during bolus administration of intravenous contrast. Multiplanar reconstructed images and MIPs were obtained and reviewed to evaluate the vascular anatomy. Patient with diminished renal function, referring provider wished to proceed with IV contrast given risk benefit. CONTRAST:  55mL OMNIPAQUE IOHEXOL 350 MG/ML SOLN COMPARISON:  Radiograph earlier today. Multiple prior chest CTA, most recently 12/06/2019 FINDINGS: CTA CHEST FINDINGS Cardiovascular: Postsurgical changes after thoracic endograft repair of descending thoracic aorta just distal to the origin of the left subclavian to the level of the diaphragm. Graft is  patent and unchanged in position. No evidence of acute dissection, acute aortic hematoma, or perigraft inflammation. Aortic arch branch vessels are patent. There is atherosclerosis of the transverse aorta. Cardiomegaly with pacemaker in place. No pericardial effusion. There are coronary artery calcifications. Cannot assess for pulmonary embolus given phase of contrast tailored to aortic evaluation. Mediastinum/Nodes: No mediastinal fluid collection. No adenopathy. Patulous esophagus without wall thickening. Lungs/Pleura: There is smooth septal thickening suspicious for pulmonary edema. Subsegmental atelectasis in the lingula and lower lobes. No confluent consolidation. No pleural fluid. Central bronchial thickening may be congestive. Mild emphysema. Musculoskeletal: Thoracic spondylosis. There are no acute or suspicious osseous abnormalities. Review of the MIP images confirms the above findings. CTA ABDOMEN AND PELVIS FINDINGS VASCULAR Aorta: Atherosclerosis without aneurysm or acute findings. No dissection. No significant stenosis. Celiac: Patent with mild plaque at the origin. No significant stenosis, dissection, or acute findings. SMA: Patent without dissection or acute findings. Renals: Mild plaque at the origin.  No significant stenosis. IMA: Patent. Inflow: Left proximal external iliac stent in place. Curvilinear contrast just distal to the stent spans 15 x 17 mm (series 7, image 149), previously 14 x 5 mm. Distal vessel is patent. Surrounding low-density fluid measuring approximately 3.6 x 2.4  cm, slightly increased from prior. No acute findings. Calcified plaque involving the right external iliac artery. Veins: No obvious venous abnormality within the limitations of this arterial phase study. Review of the MIP images confirms the above findings. NON-VASCULAR Hepatobiliary: No focal liver abnormality on arterial phase exam. Partially distended gallbladder. No calcified gallstone. Pancreas: No ductal  dilatation or inflammation. Spleen: Normal in size. Stable well-defined 13 mm low-density lesion from prior. Adrenals/Urinary Tract: Mild adrenal thickening without dominant nodule. There is no hydronephrosis. No perinephric edema. Unremarkable urinary bladder. Stomach/Bowel: Stomach mildly distended with ingested material. Equivocal gastric wall thickening about the cardia. Normal position of the duodenum and ligament of Treitz. Decompressed small bowel without obstruction or obvious inflammation. Appendix tentatively visualized and normal. There is no appendicitis. Large volume of stool throughout the colon. There is colonic tortuosity. No colonic wall thickening or inflammatory change. Lymphatic: No bulky abdominopelvic adenopathy. Reproductive: Hysterectomy.  No adnexal mass. Other: No ascites or free air. Postsurgical change of the anterior abdominal wall. Small ventral abdominal wall hernia containing fat. Small umbilical hernia containing fat. Musculoskeletal: Lumbar spondylosis. There are no acute or suspicious osseous abnormalities. Review of the MIP images confirms the above findings. IMPRESSION: 1. Postsurgical changes after thoracic endograft repair of descending thoracic aorta. Graft is patent and unchanged in position. No evidence of acute dissection, acute aortic hematoma, or perigraft inflammation. 2. Left proximal external iliac stent in place. Again seen extraluminal contrast along the distal aspect of the stent that has slightly increased in size from prior exam, pseudoaneurysm versus postsurgical change. No rupture or inflammation. 3. Septal thickening suspicious for pulmonary edema. Mild cardiomegaly. 4. Equivocal gastric wall thickening about the cardia, can be seen with gastritis. 5. Large volume of stool throughout the colon with colonic tortuosity, suggesting constipation. Aortic Atherosclerosis (ICD10-I70.0) and Emphysema (ICD10-J43.9). Electronically Signed   By: Keith Rake M.D.    On: 01/29/2020 02:52   CT HEAD CODE STROKE WO CONTRAST  Result Date: 01/29/2020 CLINICAL DATA:  Code stroke. Initial evaluation for acute left lower extremity numbness. EXAM: CT HEAD WITHOUT CONTRAST TECHNIQUE: Contiguous axial images were obtained from the base of the skull through the vertex without intravenous contrast. COMPARISON:  Prior CT from 02/15/2005 FINDINGS: Brain: Cerebral volume within normal limits for age. Mild chronic microvascular ischemic disease. No acute intracranial hemorrhage. No acute large vessel territory infarct. No mass lesion, midline shift or mass effect. No hydrocephalus or extra-axial fluid collection. Vascular: No hyperdense vessel. Scattered vascular calcifications noted within the carotid siphons. Skull: Scalp soft tissues within normal limits. No calvarial fracture. Sinuses/Orbits: Globes and orbital soft tissues within normal limits. Paranasal sinuses are clear. No significant mastoid effusion. Other: None. ASPECTS [facility] Stroke Program Early CT Score) - Ganglionic level infarction (caudate, lentiform nuclei, internal capsule, insula, M1-M3 cortex): 7 - Supraganglionic infarction (M4-M6 cortex): 3 Total score (0-10 with 10 being normal): 10 IMPRESSION: 1. No acute intracranial infarct or other abnormality. 2. ASPECTS is 10. 3. Mild chronic microvascular ischemic disease. Results were called by telephone at the time of interpretation on 01/29/2020 at 12:49 am to provider IVA KNAPP , who verbally acknowledged these results. Electronically Signed   By: Jeannine Boga M.D.   On: 01/29/2020 00:49    Microbiology: Recent Results (from the past 240 hour(s))  SARS CORONAVIRUS 2 (TAT 6-24 HRS) Nasopharyngeal Nasopharyngeal Swab     Status: Abnormal   Collection Time: 01/29/20  3:25 AM   Specimen: Nasopharyngeal Swab  Result Value Ref Range Status  SARS Coronavirus 2 POSITIVE (A) NEGATIVE Final    Comment: (NOTE) SARS-CoV-2 target nucleic acids are DETECTED.  The  SARS-CoV-2 RNA is generally detectable in upper and lower respiratory specimens during the acute phase of infection. Positive results are indicative of the presence of SARS-CoV-2 RNA. Clinical correlation with patient history and other diagnostic information is  necessary to determine patient infection status. Positive results do not rule out bacterial infection or co-infection with other viruses.  The expected result is Negative.  Fact Sheet for Patients: SugarRoll.be  Fact Sheet for Healthcare Providers: https://www.woods-mathews.com/  This test is not yet approved or cleared by the [place] FDA and  has been authorized for detection and/or diagnosis of SARS-CoV-2 by FDA under an Emergency Use Authorization (EUA). This EUA will remain  in effect (meaning this test can be used) for the duration of the COVID-19 declaration under Section 564(b)(1) of the Act, 21 U. S.C. section 360bbb-3(b)(1), unless the authorization is terminated or revoked sooner.   Performed at [facility], [place] [address] [place], [place] [address]      Labs: Basic Metabolic Panel: Recent Labs  Lab 01/29/20 0040  NA 140  K 3.4*  CL 101  CO2 29  GLUCOSE 140*  BUN 31*  CREATININE 2.19*  CALCIUM 8.6*  MG 1.9  PHOS 3.6   Liver Function Tests: Recent Labs  Lab 01/29/20 0040  AST 23  ALT 15  ALKPHOS 56  BILITOT 0.3  PROT 7.4  ALBUMIN 3.6   CBC: Recent Labs  Lab 01/29/20 0040  WBC 5.1  NEUTROABS 3.4  HGB 10.5*  HCT 33.8*  MCV 90.9  PLT 183    BNP (last 3 results) Recent Labs    09/15/19 1223 01/29/20 0040  BNP 527.8* 147.0*    ProBNP (last 3 results) No results for input(s): PROBNP in the last 8760 hours.  CBG: Recent Labs  Lab 01/29/20 0034 01/29/20 0923 01/29/20 1154 01/29/20 1709 01/30/20 0849  GLUCAP 126* 79 133* 103* 101*   Signed:  Barton Dubois MD.  [facility] 01/30/2020, 1:18 PM

## 2020-02-29 ENCOUNTER — Other Ambulatory Visit: Payer: Self-pay | Admitting: *Deleted

## 2020-02-29 DIAGNOSIS — I712 Thoracic aortic aneurysm, without rupture, unspecified: Secondary | ICD-10-CM

## 2020-02-29 DIAGNOSIS — I71019 Dissection of thoracic aorta, unspecified: Secondary | ICD-10-CM

## 2020-02-29 DIAGNOSIS — I7101 Dissection of thoracic aorta: Secondary | ICD-10-CM

## 2020-03-26 ENCOUNTER — Ambulatory Visit (HOSPITAL_COMMUNITY)
Admission: RE | Admit: 2020-03-26 | Discharge: 2020-03-26 | Disposition: A | Discharge status: Home / Self Care | Payer: Medicare Other | Source: Ambulatory Visit | Attending: Surgery | Admitting: Surgery

## 2020-03-26 ENCOUNTER — Other Ambulatory Visit: Payer: Self-pay

## 2020-03-26 DIAGNOSIS — I71019 Dissection of thoracic aorta, unspecified: Secondary | ICD-10-CM

## 2020-03-26 DIAGNOSIS — I7101 Dissection of thoracic aorta: Secondary | ICD-10-CM | POA: Diagnosis present

## 2020-03-26 LAB — POCT I-STAT CREATININE: Creatinine, Ser: 1.2 mg/dL — ABNORMAL HIGH (ref 0.44–1.00)

## 2020-03-26 MED ORDER — IOHEXOL 350 MG/ML SOLN
100.0000 mL | Freq: Once | INTRAVENOUS | Status: AC | PRN
  Administered 2020-03-26: 100 mL via INTRAVENOUS

## 2020-04-02 ENCOUNTER — Encounter: Payer: Self-pay | Admitting: Surgery

## 2020-04-02 ENCOUNTER — Ambulatory Visit (INDEPENDENT_AMBULATORY_CARE_PROVIDER_SITE_OTHER): Payer: Medicare Other | Admitting: Surgery

## 2020-04-02 ENCOUNTER — Other Ambulatory Visit: Payer: Self-pay

## 2020-04-02 VITALS — BP 185/88 | HR 74 | Temp 98.1°F | Wt 169.0 lb

## 2020-04-02 DIAGNOSIS — M7989 Other specified soft tissue disorders: Secondary | ICD-10-CM

## 2020-04-02 DIAGNOSIS — I712 Thoracic aortic aneurysm, without rupture, unspecified: Secondary | ICD-10-CM

## 2020-04-02 NOTE — Progress Notes (Signed)
[facility] [place]  Patient name: [name] MRN: [id number] DOB: 1950/07/20 Sex: female   REASON FOR VISIT:    Follow up  [place]:   [name] is a 70 y.o. female who presented to the hospital in August 2021 with chest and back pain.  She was very hypertensive.  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  She was initially managed nonoperatively, however we could not get her blood pressure stabilized.  Ultimately on 09/09/2019 she underwent endovascular stent graft placement without left subclavian artery coverage.  This required a conduit off of the left external iliac artery.  She did have plaque at the level of the conduit which was evaluated with angiography from the groin after the procedure, and a stenosis was visualized so this was stented.  On postoperative day 2 she experienced lower extremity paralysis.  A spinal drain was placed and her weakness ultimately resolved.  She did develop a acute episode of renal insufficiency which resolved on its own.  She is complaining of pain in her thigh is an achy type pain  The patient has a diagnosis of A. fib but is only taking aspirin. She is a current smoker and has COPD. She is medically managed for hypertension. She is a diabetic. She is medically managed for hypertension. She takes a statin for hypercholesterolemia.   PAST MEDICAL HISTORY:   Past Medical History:  Diagnosis Date  . Anemia   . Anxiety   . Arthritis   . Atrial fib/flutter, transient   . Cataract   . COPD (chronic obstructive pulmonary disease) ([place])   . Depression   . Diabetes mellitus   . Diverticulosis   . DJD (degenerative joint disease)   . GERD (gastroesophageal reflux disease)   . Gout   . History of pneumonia   . Hypertension   . Hypothyroidism   . Internal hemorrhoids      FAMILY HISTORY:   Family History  Problem  Relation Age of Onset  . Prostate cancer Father   . Hypertension Mother   . Heart attack Mother   . Heart disease Mother   . Kidney disease Mother 81  . Stomach cancer Son   . Diabetes Maternal Aunt   . CAD Maternal Aunt   . Stroke Maternal Aunt   . Heart disease Maternal Uncle   . CAD Maternal Uncle   . Stroke Maternal Uncle   . Colon cancer Neg Hx   . Esophageal cancer Neg Hx   . Rectal cancer Neg Hx     SOCIAL HISTORY:   Social History   Tobacco Use  . Smoking status: Current Some Day Smoker    Packs/day: 0.50    Years: 30.00    Pack years: 15.00    Types: Cigarettes  . Smokeless tobacco: Never Used  . Tobacco comment: 10 cigarettes  Substance Use Topics  . Alcohol use: No     ALLERGIES:   Allergies  Allergen Reactions  . Amlodipine Swelling    Started very close to episode of angioedema to ACE, unknown if this added effects or just residual  . Lisinopril     angioedema  . Losartan Swelling    Lip swelling   . Codeine Phosphate Other (See Comments)    REACTION: UNKNOWN  . Morphine And Related Other (See Comments)    Cannot take:Heart problems  . Naproxen Nausea And Vomiting  . Nsaids Nausea Only  .  Penicillins     UNKNOWN REACTION  . Propoxyphene N-Acetaminophen Other (See Comments)    Makes me gag  . Sulfamethoxazole-Trimethoprim Other (See Comments)    Gi upset  . Flexeril [Cyclobenzaprine] Anxiety and Other (See Comments)    Pt states medication gives her nightmares and insomnia.     CURRENT MEDICATIONS:   Current Outpatient Medications  Medication Sig Dispense Refill  . aspirin 81 MG tablet Take 81 mg by mouth every morning.     . carvedilol (COREG) 25 MG tablet Take 1 tablet (25 mg total) by mouth 2 (two) times daily with a meal. 180 tablet 3  . cholecalciferol (VITAMIN D) 1000 UNITS tablet Take 1,000 Units by mouth every morning.     . cloNIDine (CATAPRES) 0.2 MG tablet Take 1 tablet (0.2 mg total) by mouth 3 (three) times daily. 90  tablet 2  . clopidogrel (PLAVIX) 75 MG tablet Take 1 tablet (75 mg total) by mouth daily. 30 tablet 3  . gabapentin (NEURONTIN) 100 MG capsule Take 1 capsule (100 mg total) by mouth 3 (three) times daily. 90 capsule 2  . glimepiride (AMARYL) 1 MG tablet Take 1 tablet (1 mg total) by mouth every morning. 30 tablet 3  . hydrALAZINE (APRESOLINE) 100 MG tablet Take 100 mg by mouth 3 (three) times daily.    Marland Kitchen levothyroxine (SYNTHROID) 25 MCG tablet Take 1 tablet (25 mcg total) by mouth daily. 30 tablet 2  . lidocaine (LMX) 4 % cream Apply topically 4 (four) times daily as needed (left upper extremity pain). 30 g 0  . melatonin 3 MG TABS tablet Take 3 tablets (9 mg total) by mouth at bedtime. 100 tablet 0  . Multiple Vitamin (MULTIVITAMIN) tablet Take 1 tablet by mouth every morning.     . nicotine (NICODERM CQ - DOSED IN MG/24 HR) 7 mg/24hr patch Place 1 patch (7 mg total) onto the skin daily. 28 patch 0  . Oxycodone HCl 10 MG TABS Take 10 mg by mouth 4 (four) times daily as needed.    . pantoprazole (PROTONIX) 40 MG tablet Take 1 tablet (40 mg total) by mouth daily. 30 tablet 1  . polyethylene glycol (MIRALAX / GLYCOLAX) packet Take 17 g by mouth 2 (two) times daily.     . rosuvastatin (CRESTOR) 20 MG tablet Take 1 tablet (20 mg total) by mouth daily. 30 tablet 2  . sertraline (ZOLOFT) 100 MG tablet Take 50 mg by mouth at bedtime.     No current facility-administered medications for this visit.    REVIEW OF SYSTEMS:   [X]  denotes positive finding, [ ]  denotes negative finding Cardiac  Comments:  Chest pain or chest pressure:    Shortness of breath upon exertion:    Short of breath when lying flat:    Irregular heart rhythm:        Vascular    Pain in calf, thigh, or hip brought on by ambulation:    Pain in feet at night that wakes you up from your sleep:     Blood clot in your veins:    Leg swelling:  x       Pulmonary    Oxygen at home:    Productive cough:     Wheezing:          Neurologic    Sudden weakness in arms or legs:     Sudden numbness in arms or legs:     Sudden onset of difficulty speaking or slurred speech:  Temporary loss of vision in one eye:     Problems with dizziness:         Gastrointestinal    Blood in stool:     Vomited blood:         Genitourinary    Burning when urinating:     Blood in urine:        Psychiatric    Major depression:         Hematologic    Bleeding problems:    Problems with blood clotting too easily:        Skin    Rashes or ulcers:        Constitutional    Fever or chills:      PHYSICAL EXAM:   Vitals:   04/02/20 1201  BP: (!) 185/88  Pulse: 74  Temp: 98.1 F (36.7 C)  TempSrc: Skin  SpO2: 93%  Weight: 169 lb (76.7 kg)    GENERAL: The patient is a well-nourished female, in no acute distress. The vital signs are documented above. CARDIAC: There is a regular rate and rhythm.  VASCULAR: Triphasic pedal Doppler signals PULMONARY: Non-labored respirations ABDOMEN: Soft and non-tender with normal pitched bowel sounds.  MUSCULOSKELETAL: There are no major deformities or cyanosis. NEUROLOGIC: No focal weakness or paresthesias are detected. SKIN: There are no ulcers or rashes noted. PSYCHIATRIC: The patient has a normal affect.  STUDIES:   I have reviewed the following CTA:  1. Stable postsurgical changes of prior TEVAR without evidence of complication. 2. 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 3. Focal stenosis of the origin of the IMA. 4. Additional ancillary findings as above without significant interval change. MEDICAL ISSUES:   Status post endovascular repair of the intramural hematoma with ulceration in the thoracic aorta.  This required a retroperitoneal conduit to deliver the stent.  She developed a  narrowing after deployment in the iliac artery which was subsequently stented.  Her CT scan today shows exclusion of the intramural hematoma with near normal appearance.  There is a luminal irregularity in the left iliac artery that is being called a pseudoaneurysm.  My suspicion is that this is a remnant of the conduit and not an actual pseudoaneurysm, however it did appear to enlarge between the 2 scans so we will need to monitor this moving forward.  I Georgina Peer have a rescanned in 1 year.  Leg pain: This is not arterial as she has triphasic Doppler signals in both dorsalis pedis arteries.  She does have mild, 1+ bilateral edema, therefore I am going to place her into 20-30 thigh-high compression socks.  Hopefully she will get some benefit from this.    Leia Alf, MD, FACS [facility] [phone] Pager [phone]

## 2020-05-31 ENCOUNTER — Telehealth: Payer: Self-pay

## 2020-05-31 NOTE — Telephone Encounter (Signed)
Patient calls to report pain, swelling and bruising behind her L knee. No pain in her foot. She is s/p L TEVAR, L iliac endarterctomy in 2021. Seen in March, says bruising was present then, but has gotten worse. Says it has been occurring for 2 days, but also since the surgery and has been waking her up at night for months. Discussed with Dr. Dessie Coma, will bring her in for discussion.

## 2020-06-11 ENCOUNTER — Encounter: Payer: Self-pay | Admitting: Surgery

## 2020-06-11 ENCOUNTER — Other Ambulatory Visit: Payer: Self-pay

## 2020-06-11 ENCOUNTER — Ambulatory Visit (INDEPENDENT_AMBULATORY_CARE_PROVIDER_SITE_OTHER): Payer: Medicare Other | Admitting: Surgery

## 2020-06-11 VITALS — BP 145/77 | HR 76 | Temp 98.3°F | Resp 20 | Ht 65.0 in | Wt 169.0 lb

## 2020-06-11 DIAGNOSIS — I712 Thoracic aortic aneurysm, without rupture, unspecified: Secondary | ICD-10-CM

## 2020-06-11 NOTE — Progress Notes (Signed)
[facility] [place]  Patient name: [name] MRN: [id number] DOB: May 23, 1950 Sex: female   REASON FOR VISIT:    Follow up  [place]:    Emanuel Campos Horneis a 70 y.o.femalewho presented to the hospital in August 2021 with chest and back pain. She was very hypertensive. 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 She was initially managed nonoperatively, however we could not get her blood pressure stabilized. Ultimately on 09/09/2019 she underwent endovascular stent graft placement without left subclavian artery coverage. This required a conduit off of the left external iliac artery. She did have plaque at the level of the conduit which was evaluated with angiography from the groin after the procedure, and a stenosis was visualized so this was stented. On postoperative day 2 she experienced lower extremity paralysis. A spinal drain was placed and her weakness ultimately resolved. She did develop a acute episode of renal insufficiency which resolved on its own.  She is here today for evaluation of pain in her left leg that starts in her groin.  She describes it as an ache.  It does not radiate.  Nothing relieves her pain and it is becoming very debilitating   The patient has a diagnosis of A. fib but is only taking aspirin. She is a current smoker and has COPD. She is medically managed for hypertension. She is a diabetic. She is medically managed for hypertension. She takes a statin for hypercholesterolemia.  PAST MEDICAL HISTORY:   Past Medical History:  Diagnosis Date  . Anemia   . Anxiety   . Arthritis   . Atrial fib/flutter, transient   . Cataract   . COPD (chronic obstructive pulmonary disease) ([place])   . Depression   . Diabetes mellitus   . Diverticulosis   . DJD (degenerative joint disease)   . GERD (gastroesophageal reflux disease)   . Gout   .  History of pneumonia   . Hypertension   . Hypothyroidism   . Internal hemorrhoids      FAMILY HISTORY:   Family History  Problem Relation Age of Onset  . Prostate cancer Father   . Hypertension Mother   . Heart attack Mother   . Heart disease Mother   . Kidney disease Mother 13  . Stomach cancer Son   . Diabetes Maternal Aunt   . CAD Maternal Aunt   . Stroke Maternal Aunt   . Heart disease Maternal Uncle   . CAD Maternal Uncle   . Stroke Maternal Uncle   . Colon cancer Neg Hx   . Esophageal cancer Neg Hx   . Rectal cancer Neg Hx     SOCIAL HISTORY:   Social History   Tobacco Use  . Smoking status: Current Some Day Smoker    Packs/day: 0.50    Years: 30.00    Pack years: 15.00    Types: Cigarettes  . Smokeless tobacco: Never Used  . Tobacco comment: 10 cigarettes  Substance Use Topics  . Alcohol use: No     ALLERGIES:   Allergies  Allergen Reactions  . Amlodipine Swelling    Started very close to episode of angioedema to ACE, unknown if this added effects or just residual  . Lisinopril     angioedema  . Losartan Swelling    Lip swelling   . Codeine Phosphate Other (See Comments)    REACTION: UNKNOWN  . Morphine And Related Other (See Comments)  Cannot take:Heart problems  . Naproxen Nausea And Vomiting  . Nsaids Nausea Only  . Penicillins     UNKNOWN REACTION  . Propoxyphene N-Acetaminophen Other (See Comments)    Makes me gag  . Sulfamethoxazole-Trimethoprim Other (See Comments)    Gi upset  . Flexeril [Cyclobenzaprine] Anxiety and Other (See Comments)    Pt states medication gives her nightmares and insomnia.     CURRENT MEDICATIONS:   Current Outpatient Medications  Medication Sig Dispense Refill  . aspirin 81 MG tablet Take 81 mg by mouth every morning.     . carvedilol (COREG) 25 MG tablet Take 1 tablet (25 mg total) by mouth 2 (two) times daily with a meal. 180 tablet 3  . cholecalciferol (VITAMIN D) 1000 UNITS tablet Take 1,000  Units by mouth every morning.     . cloNIDine (CATAPRES) 0.2 MG tablet Take 1 tablet (0.2 mg total) by mouth 3 (three) times daily. 90 tablet 2  . clopidogrel (PLAVIX) 75 MG tablet Take 1 tablet (75 mg total) by mouth daily. 30 tablet 3  . gabapentin (NEURONTIN) 100 MG capsule Take 1 capsule (100 mg total) by mouth 3 (three) times daily. 90 capsule 2  . glimepiride (AMARYL) 1 MG tablet Take 1 tablet (1 mg total) by mouth every morning. 30 tablet 3  . hydrALAZINE (APRESOLINE) 100 MG tablet Take 100 mg by mouth 3 (three) times daily.    Marland Kitchen levothyroxine (SYNTHROID) 25 MCG tablet Take 1 tablet (25 mcg total) by mouth daily. 30 tablet 2  . lidocaine (LMX) 4 % cream Apply topically 4 (four) times daily as needed (left upper extremity pain). 30 g 0  . melatonin 3 MG TABS tablet Take 3 tablets (9 mg total) by mouth at bedtime. 100 tablet 0  . Multiple Vitamin (MULTIVITAMIN) tablet Take 1 tablet by mouth every morning.     . nicotine (NICODERM CQ - DOSED IN MG/24 HR) 7 mg/24hr patch Place 1 patch (7 mg total) onto the skin daily. 28 patch 0  . Oxycodone HCl 10 MG TABS Take 10 mg by mouth 4 (four) times daily as needed.    . pantoprazole (PROTONIX) 40 MG tablet Take 1 tablet (40 mg total) by mouth daily. 30 tablet 1  . polyethylene glycol (MIRALAX / GLYCOLAX) packet Take 17 g by mouth 2 (two) times daily.     . rosuvastatin (CRESTOR) 20 MG tablet Take 1 tablet (20 mg total) by mouth daily. 30 tablet 2  . sertraline (ZOLOFT) 100 MG tablet Take 50 mg by mouth at bedtime.     No current facility-administered medications for this visit.    REVIEW OF SYSTEMS:   [X]  denotes positive finding, [ ]  denotes negative finding Cardiac  Comments:  Chest pain or chest pressure:    Shortness of breath upon exertion:    Short of breath when lying flat:    Irregular heart rhythm:        Vascular    Pain in calf, thigh, or hip brought on by ambulation:    Pain in feet at night that wakes you up from your sleep:      Blood clot in your veins:    Leg swelling:         Pulmonary    Oxygen at home:    Productive cough:     Wheezing:         Neurologic    Sudden weakness in arms or legs:     Sudden numbness in  arms or legs:     Sudden onset of difficulty speaking or slurred speech:    Temporary loss of vision in one eye:     Problems with dizziness:         Gastrointestinal    Blood in stool:     Vomited blood:         Genitourinary    Burning when urinating:     Blood in urine:        Psychiatric    Major depression:         Hematologic    Bleeding problems:    Problems with blood clotting too easily:        Skin    Rashes or ulcers:        Constitutional    Fever or chills:      PHYSICAL EXAM:   Vitals:   06/11/20 0859  BP: (!) 145/77  Pulse: 76  Resp: 20  Temp: 98.3 F (36.8 C)  SpO2: 90%  Weight: 169 lb (76.7 kg)  Height: 5\' 5"  (1.651 m)    GENERAL: The patient is a well-nourished female, in no acute distress. The vital signs are documented above. CARDIAC: There is a regular rate and rhythm.  VASCULAR: Palpable left dorsalis pedis and femoral pulse PULMONARY: Non-labored respirations ABDOMEN: Soft and non-tender with normal pitched bowel sounds.  MUSCULOSKELETAL: No obvious left leg masses NEUROLOGIC: No focal weakness or paresthesias are detected. SKIN: There are no ulcers or rashes noted. PSYCHIATRIC: The patient has a normal affect.  STUDIES:   none  MEDICAL ISSUES:   The patient is experiencing some significant pain in her left leg.  She does have some ecchymosis around her popliteal fossa.  Fortunately, she does have a palpable dorsalis pedis pulse on the left.  I am not exactly sure what the etiology of her symptoms are.  However given her prior vascular history, I think that we need to evaluate her stent graft repair as well as her pelvis where her conduit was placed.  Most of her pain is in the popliteal fossa on the left and so I think the best option  is a CT angiogram of the chest with abdomen pelvis and runoff CT scan.  This will be able to take the place of her surveillance CT scan.  I would get this done in the next week or 2 and have her follow-up afterwards.    Leia Alf, MD, FACS [facility] [phone] Pager [phone]

## 2020-06-25 ENCOUNTER — Other Ambulatory Visit: Payer: Self-pay

## 2020-06-25 ENCOUNTER — Encounter (HOSPITAL_COMMUNITY): Payer: Self-pay | Admitting: Emergency Medicine

## 2020-06-25 ENCOUNTER — Observation Stay (HOSPITAL_COMMUNITY)
Admission: EM | Admit: 2020-06-25 | Discharge: 2020-06-26 | Disposition: A | Discharge status: Home / Self Care | Payer: Medicare Other | Attending: Internal Medicine | Admitting: Internal Medicine

## 2020-06-25 ENCOUNTER — Emergency Department (HOSPITAL_COMMUNITY): Payer: Medicare Other

## 2020-06-25 DIAGNOSIS — I129 Hypertensive chronic kidney disease with stage 1 through stage 4 chronic kidney disease, or unspecified chronic kidney disease: Secondary | ICD-10-CM | POA: Insufficient documentation

## 2020-06-25 DIAGNOSIS — J441 Chronic obstructive pulmonary disease with (acute) exacerbation: Secondary | ICD-10-CM | POA: Diagnosis not present

## 2020-06-25 DIAGNOSIS — T783XXD Angioneurotic edema, subsequent encounter: Secondary | ICD-10-CM

## 2020-06-25 DIAGNOSIS — Z8616 Personal history of COVID-19: Secondary | ICD-10-CM | POA: Diagnosis not present

## 2020-06-25 DIAGNOSIS — I480 Paroxysmal atrial fibrillation: Secondary | ICD-10-CM | POA: Diagnosis not present

## 2020-06-25 DIAGNOSIS — Z79899 Other long term (current) drug therapy: Secondary | ICD-10-CM | POA: Insufficient documentation

## 2020-06-25 DIAGNOSIS — T783XXA Angioneurotic edema, initial encounter: Secondary | ICD-10-CM | POA: Diagnosis not present

## 2020-06-25 DIAGNOSIS — Z20822 Contact with and (suspected) exposure to covid-19: Secondary | ICD-10-CM | POA: Insufficient documentation

## 2020-06-25 DIAGNOSIS — J9601 Acute respiratory failure with hypoxia: Secondary | ICD-10-CM | POA: Diagnosis not present

## 2020-06-25 DIAGNOSIS — E1122 Type 2 diabetes mellitus with diabetic chronic kidney disease: Secondary | ICD-10-CM | POA: Insufficient documentation

## 2020-06-25 DIAGNOSIS — J9602 Acute respiratory failure with hypercapnia: Secondary | ICD-10-CM

## 2020-06-25 DIAGNOSIS — Z950 Presence of cardiac pacemaker: Secondary | ICD-10-CM | POA: Insufficient documentation

## 2020-06-25 DIAGNOSIS — Z7984 Long term (current) use of oral hypoglycemic drugs: Secondary | ICD-10-CM | POA: Insufficient documentation

## 2020-06-25 DIAGNOSIS — Z7982 Long term (current) use of aspirin: Secondary | ICD-10-CM | POA: Diagnosis not present

## 2020-06-25 DIAGNOSIS — F17210 Nicotine dependence, cigarettes, uncomplicated: Secondary | ICD-10-CM | POA: Insufficient documentation

## 2020-06-25 DIAGNOSIS — R220 Localized swelling, mass and lump, head: Secondary | ICD-10-CM | POA: Diagnosis present

## 2020-06-25 DIAGNOSIS — E039 Hypothyroidism, unspecified: Secondary | ICD-10-CM | POA: Insufficient documentation

## 2020-06-25 DIAGNOSIS — N1832 Chronic kidney disease, stage 3b: Secondary | ICD-10-CM | POA: Diagnosis not present

## 2020-06-25 LAB — CBC WITH DIFFERENTIAL/PLATELET
Abs Immature Granulocytes: 0.06 10*3/uL (ref 0.00–0.07)
Basophils Absolute: 0.1 10*3/uL (ref 0.0–0.1)
Basophils Relative: 1 %
Eosinophils Absolute: 0.4 10*3/uL (ref 0.0–0.5)
Eosinophils Relative: 4 %
HCT: 37.9 % (ref 36.0–46.0)
Hemoglobin: 12.4 g/dL (ref 12.0–15.0)
Immature Granulocytes: 1 %
Lymphocytes Relative: 14 %
Lymphs Abs: 1.2 10*3/uL (ref 0.7–4.0)
MCH: 30.9 pg (ref 26.0–34.0)
MCHC: 32.7 g/dL (ref 30.0–36.0)
MCV: 94.5 fL (ref 80.0–100.0)
Monocytes Absolute: 0.9 10*3/uL (ref 0.1–1.0)
Monocytes Relative: 10 %
Neutro Abs: 6.2 10*3/uL (ref 1.7–7.7)
Neutrophils Relative %: 70 %
Platelets: 244 10*3/uL (ref 150–400)
RBC: 4.01 MIL/uL (ref 3.87–5.11)
RDW: 19.2 % — ABNORMAL HIGH (ref 11.5–15.5)
WBC: 8.8 10*3/uL (ref 4.0–10.5)
nRBC: 0 % (ref 0.0–0.2)

## 2020-06-25 LAB — COMPREHENSIVE METABOLIC PANEL
ALT: 21 U/L (ref 0–44)
AST: 32 U/L (ref 15–41)
Albumin: 3.8 g/dL (ref 3.5–5.0)
Alkaline Phosphatase: 57 U/L (ref 38–126)
Anion gap: 6 (ref 5–15)
BUN: 30 mg/dL — ABNORMAL HIGH (ref 8–23)
CO2: 30 mmol/L (ref 22–32)
Calcium: 8.9 mg/dL (ref 8.9–10.3)
Chloride: 101 mmol/L (ref 98–111)
Creatinine, Ser: 1.51 mg/dL — ABNORMAL HIGH (ref 0.44–1.00)
GFR, Estimated: 37 mL/min — ABNORMAL LOW (ref >60)
Glucose, Bld: 176 mg/dL — ABNORMAL HIGH (ref 70–99)
Potassium: 4.8 mmol/L (ref 3.5–5.1)
Sodium: 137 mmol/L (ref 135–145)
Total Bilirubin: 0.5 mg/dL (ref 0.3–1.2)
Total Protein: 7.3 g/dL (ref 6.5–8.1)

## 2020-06-25 LAB — BLOOD GAS, VENOUS
Acid-Base Excess: 3.2 mmol/L — ABNORMAL HIGH (ref 0.0–2.0)
Acid-Base Excess: 2.9 mmol/L — ABNORMAL HIGH (ref 0.0–2.0)
Bicarbonate: 25.7 mmol/L (ref 20.0–28.0)
Bicarbonate: 25.2 mmol/L (ref 20.0–28.0)
FIO2: 96
FIO2: 100
O2 Saturation: 85.2 %
O2 Saturation: 84.2 %
Patient temperature: 36.9
Patient temperature: 37
pCO2, Ven: 62 mmHg — ABNORMAL HIGH (ref 44.0–60.0)
pCO2, Ven: 60 mmHg (ref 44.0–60.0)
pH, Ven: 7.294 (ref 7.250–7.430)
pH, Ven: 7.302 (ref 7.250–7.430)
pO2, Ven: 60.7 mmHg — ABNORMAL HIGH (ref 32.0–45.0)
pO2, Ven: 57.2 mmHg — ABNORMAL HIGH (ref 32.0–45.0)

## 2020-06-25 LAB — URINALYSIS, ROUTINE W REFLEX MICROSCOPIC
Bacteria, UA: NONE SEEN
Bilirubin Urine: NEGATIVE
Glucose, UA: 50 mg/dL — AB
Hgb urine dipstick: NEGATIVE
Ketones, ur: NEGATIVE mg/dL
Leukocytes,Ua: NEGATIVE
Nitrite: NEGATIVE
Protein, ur: 30 mg/dL — AB
Specific Gravity, Urine: 1.012 (ref 1.005–1.030)
pH: 6 (ref 5.0–8.0)

## 2020-06-25 LAB — RESP PANEL BY RT-PCR (FLU A&B, COVID) ARPGX2
Influenza A by PCR: NEGATIVE
Influenza B by PCR: NEGATIVE
SARS Coronavirus 2 by RT PCR: NEGATIVE

## 2020-06-25 LAB — PROCALCITONIN: Procalcitonin: <0.1 ng/mL

## 2020-06-25 LAB — GLUCOSE, CAPILLARY
Glucose-Capillary: 169 mg/dL — ABNORMAL HIGH (ref 70–99)
Glucose-Capillary: 164 mg/dL — ABNORMAL HIGH (ref 70–99)

## 2020-06-25 LAB — LIPASE, BLOOD: Lipase: 30 U/L (ref 11–51)

## 2020-06-25 LAB — LACTIC ACID, PLASMA: Lactic Acid, Venous: 1.1 mmol/L (ref 0.5–1.9)

## 2020-06-25 MED ORDER — FAMOTIDINE IN NACL 20-0.9 MG/50ML-% IV SOLN
20.0000 mg | Freq: Once | INTRAVENOUS | Status: AC
  Administered 2020-06-25: 20 mg via INTRAVENOUS
  Filled 2020-06-25: qty 50

## 2020-06-25 MED ORDER — CLOPIDOGREL BISULFATE 75 MG PO TABS
75.0000 mg | ORAL_TABLET | Freq: Every day | ORAL | Status: DC
  Administered 2020-06-26: 75 mg via ORAL
  Filled 2020-06-25: qty 1

## 2020-06-25 MED ORDER — ENOXAPARIN SODIUM 40 MG/0.4ML IJ SOSY
40.0000 mg | PREFILLED_SYRINGE | INTRAMUSCULAR | Status: DC
  Administered 2020-06-25: 40 mg via SUBCUTANEOUS
  Filled 2020-06-25: qty 0.4

## 2020-06-25 MED ORDER — HYDRALAZINE HCL 25 MG PO TABS
100.0000 mg | ORAL_TABLET | Freq: Three times a day (TID) | ORAL | Status: DC
  Administered 2020-06-25 – 2020-06-26 (×2): 100 mg via ORAL
  Filled 2020-06-25 (×2): qty 4

## 2020-06-25 MED ORDER — VITAMIN D 25 MCG (1000 UNIT) PO TABS
1000.0000 [IU] | ORAL_TABLET | Freq: Every morning | ORAL | Status: DC
  Administered 2020-06-26: 1000 [IU] via ORAL

## 2020-06-25 MED ORDER — PANTOPRAZOLE SODIUM 40 MG PO TBEC
40.0000 mg | DELAYED_RELEASE_TABLET | Freq: Every day | ORAL | Status: DC
  Administered 2020-06-26: 40 mg via ORAL
  Filled 2020-06-25: qty 1

## 2020-06-25 MED ORDER — FAMOTIDINE 20 MG PO TABS
20.0000 mg | ORAL_TABLET | Freq: Every day | ORAL | Status: DC
  Administered 2020-06-26: 20 mg via ORAL
  Filled 2020-06-25: qty 1

## 2020-06-25 MED ORDER — INSULIN ASPART 100 UNIT/ML IJ SOLN
0.0000 [IU] | Freq: Three times a day (TID) | INTRAMUSCULAR | Status: DC
  Administered 2020-06-25: 2 [IU] via SUBCUTANEOUS
  Administered 2020-06-26: 1 [IU] via SUBCUTANEOUS
  Administered 2020-06-26: 7 [IU] via SUBCUTANEOUS

## 2020-06-25 MED ORDER — SERTRALINE HCL 50 MG PO TABS
50.0000 mg | ORAL_TABLET | Freq: Every day | ORAL | Status: DC
  Administered 2020-06-25: 50 mg via ORAL
  Filled 2020-06-25: qty 1

## 2020-06-25 MED ORDER — LEVOTHYROXINE SODIUM 25 MCG PO TABS
25.0000 ug | ORAL_TABLET | Freq: Every day | ORAL | Status: DC
  Administered 2020-06-26: 25 ug via ORAL
  Filled 2020-06-25: qty 1

## 2020-06-25 MED ORDER — ROSUVASTATIN CALCIUM 20 MG PO TABS
20.0000 mg | ORAL_TABLET | Freq: Every day | ORAL | Status: DC
  Administered 2020-06-26: 20 mg via ORAL
  Filled 2020-06-25: qty 1

## 2020-06-25 MED ORDER — EPINEPHRINE 0.3 MG/0.3ML IJ SOAJ
0.3000 mg | Freq: Once | INTRAMUSCULAR | Status: AC
  Administered 2020-06-25: 0.3 mg via INTRAMUSCULAR
  Filled 2020-06-25: qty 0.3

## 2020-06-25 MED ORDER — HYDRALAZINE HCL 20 MG/ML IJ SOLN
10.0000 mg | Freq: Once | INTRAMUSCULAR | Status: AC
  Administered 2020-06-25: 10 mg via INTRAVENOUS
  Filled 2020-06-25: qty 1

## 2020-06-25 MED ORDER — FAMOTIDINE 20 MG PO TABS: 20.0000 mg | ORAL_TABLET | Freq: Two times a day (BID) | ORAL | Status: DC

## 2020-06-25 MED ORDER — ACETAMINOPHEN 650 MG RE SUPP: 650.0000 mg | Freq: Four times a day (QID) | RECTAL | Status: DC | PRN

## 2020-06-25 MED ORDER — METHYLPREDNISOLONE SODIUM SUCC 125 MG IJ SOLR
125.0000 mg | Freq: Once | INTRAMUSCULAR | Status: AC
  Administered 2020-06-25: 125 mg via INTRAVENOUS
  Filled 2020-06-25: qty 2

## 2020-06-25 MED ORDER — ACETAMINOPHEN 325 MG PO TABS: 650.0000 mg | ORAL_TABLET | Freq: Four times a day (QID) | ORAL | Status: DC | PRN

## 2020-06-25 MED ORDER — ASPIRIN 81 MG PO CHEW
81.0000 mg | CHEWABLE_TABLET | Freq: Every morning | ORAL | Status: DC
  Administered 2020-06-26: 81 mg via ORAL

## 2020-06-25 MED ORDER — HYDRALAZINE HCL 20 MG/ML IJ SOLN
10.0000 mg | Freq: Four times a day (QID) | INTRAMUSCULAR | Status: DC | PRN
  Filled 2020-06-25: qty 1

## 2020-06-25 MED ORDER — LORATADINE 10 MG PO TABS
10.0000 mg | ORAL_TABLET | Freq: Every day | ORAL | Status: DC
  Administered 2020-06-26: 10 mg via ORAL
  Filled 2020-06-25: qty 1

## 2020-06-25 MED ORDER — IPRATROPIUM-ALBUTEROL 0.5-2.5 (3) MG/3ML IN SOLN
3.0000 mL | Freq: Four times a day (QID) | RESPIRATORY_TRACT | Status: DC
  Administered 2020-06-25 – 2020-06-26 (×3): 3 mL via RESPIRATORY_TRACT
  Filled 2020-06-25 (×3): qty 3

## 2020-06-25 MED ORDER — METHYLPREDNISOLONE SODIUM SUCC 125 MG IJ SOLR
60.0000 mg | Freq: Two times a day (BID) | INTRAMUSCULAR | Status: DC
  Administered 2020-06-25 – 2020-06-26 (×2): 60 mg via INTRAVENOUS
  Filled 2020-06-25 (×2): qty 2

## 2020-06-25 MED ORDER — CLONIDINE HCL 0.2 MG PO TABS
0.2000 mg | ORAL_TABLET | Freq: Three times a day (TID) | ORAL | Status: DC
  Administered 2020-06-25 – 2020-06-26 (×2): 0.2 mg via ORAL
  Filled 2020-06-25 (×2): qty 1

## 2020-06-25 MED ORDER — ONDANSETRON HCL 4 MG/2ML IJ SOLN: 4.0000 mg | Freq: Four times a day (QID) | INTRAMUSCULAR | Status: DC | PRN

## 2020-06-25 MED ORDER — MELATONIN 3 MG PO TABS
9.0000 mg | ORAL_TABLET | Freq: Every day | ORAL | Status: DC
  Administered 2020-06-25: 9 mg via ORAL
  Filled 2020-06-25: qty 3

## 2020-06-25 MED ORDER — POLYETHYLENE GLYCOL 3350 17 G PO PACK
17.0000 g | PACK | Freq: Two times a day (BID) | ORAL | Status: DC
  Administered 2020-06-25 – 2020-06-26 (×2): 17 g via ORAL
  Filled 2020-06-25 (×2): qty 1

## 2020-06-25 MED ORDER — BUDESONIDE 0.5 MG/2ML IN SUSP
0.5000 mg | Freq: Two times a day (BID) | RESPIRATORY_TRACT | Status: DC
  Administered 2020-06-25 – 2020-06-26 (×2): 0.5 mg via RESPIRATORY_TRACT
  Filled 2020-06-25 (×2): qty 2

## 2020-06-25 MED ORDER — CHLORHEXIDINE GLUCONATE CLOTH 2 % EX PADS
6.0000 | MEDICATED_PAD | Freq: Every day | CUTANEOUS | Status: DC
  Administered 2020-06-25 – 2020-06-26 (×2): 6 via TOPICAL

## 2020-06-25 MED ORDER — CARVEDILOL 12.5 MG PO TABS
25.0000 mg | ORAL_TABLET | Freq: Two times a day (BID) | ORAL | Status: DC
  Administered 2020-06-26: 25 mg via ORAL
  Filled 2020-06-25: qty 2

## 2020-06-25 MED ORDER — OXYCODONE HCL 5 MG PO TABS
10.0000 mg | ORAL_TABLET | Freq: Four times a day (QID) | ORAL | Status: DC | PRN
  Administered 2020-06-26: 10 mg via ORAL
  Filled 2020-06-25: qty 2

## 2020-06-25 MED ORDER — GABAPENTIN 100 MG PO CAPS
100.0000 mg | ORAL_CAPSULE | Freq: Three times a day (TID) | ORAL | Status: DC
  Administered 2020-06-25 – 2020-06-26 (×2): 100 mg via ORAL
  Filled 2020-06-25 (×2): qty 1

## 2020-06-25 MED ORDER — ONDANSETRON HCL 4 MG PO TABS: 4.0000 mg | ORAL_TABLET | Freq: Four times a day (QID) | ORAL | Status: DC | PRN

## 2020-06-25 NOTE — ED Triage Notes (Signed)
Pt from home via [facility]. PT arrives with lip and tongue swelling. PT received 64mh IM benadryl. PT O2 75% on RA at home.

## 2020-06-25 NOTE — H&P (Signed)
History and Physical  [name] WPY:[id number] DOB: August 09, 1950 DOA: 06/25/2020   PCP: Antonietta Jewel, MD   Patient coming from: Home  Chief Complaint: sob, lip swelling  HPI:  [name] is a 70 y.o. 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  Upon EMS arrival, the patient was noted to have Dr. Massie Bougie of 75% on room air.  She was given IM Benadryl.  Upon arrival, the patient was somnolent and unable to provide any history.  She was subsequently placed on a nonrebreather and subsequently on BiPAP.  She was given epinephrine, and Solu-Medrol in the emergency department.  Initial VBG showed 7.29/62/60/25. Subsequently, the patient gradually woke up.  She states that she did not take any leftover medications.  She denies taking any losartan or lisinopril.  She states that about 1 week ago, she was placed on some medication regimen for "walking pneumonia" by her PCP.  Otherwise, she has not been placed on any other new medications.  She denies taking any other over-the-counter medications. In the emergency department, the patient was afebrile hemodynamically stable.  Oxygen saturation remains 100% on BiPAP.  BMP showed sodium 137, potassium 4.8, serum creatinine 1.51.  LFTs were unremarkable.  WBC 8.8, hemoglobin 12.4, platelets 244,000.  Chest x-ray showed bilateral scattered interstitial opacities.  COVID RT-PCR was negative.  EKG showed paced rhythm with nonspecific ST ST wave changes.  Lactic acid 1.1.  UA was negative for pyuria.  Assessment/Plan: Acute respiratory failure with hypoxia and hypercarbia -secondary to hypersensitivity reaction and COPD exacerbation -Wean off BiPAP as tolerated -Personally reviewed  chest x-ray--scattered bilateral interstitial opacities  Angioedema -Likely secondary to recent Bactrim use -New medications that was started on 06/14/2020--Bactrim DS, clindamycin,  Tessalon Perles, and prednisone -Patient given epinephrine, Solu-Medrol, Benadryl in the ED -Continue Pepcid -Continue loratadine -Continue IV Solu-Medrol  COPD exacerbation -Start duo nebs -Start Pulmicort -Continue IV Solu-Medrol  Tobacco abuse -Tobacco cessation discussed  Chronic pain syndrome -Continue home dose oxycodone -PDMP reviewed--pt receives monthly oxycodone 10 mg #120  Diabetes mellitus type 2 -Hold Amaryl and metformin -NovoLog sliding scale -Hemoglobin A1c  Essential hypertension -Restart carvedilol, hydralazine, clonidine  Hypothyroidism -Continue Synthroid  S/p TEVAR -Continue aspirin and Plavix  Hyperlipidemia -Continue statin          Past Medical History:  Diagnosis Date  . Anemia   . Anxiety   . Arthritis   . Atrial fib/flutter, transient   . Cataract   . COPD (chronic obstructive pulmonary disease) ([place])   . Depression   . Diabetes mellitus   . Diverticulosis   . DJD (degenerative joint disease)   . GERD (gastroesophageal reflux disease)   . Gout   . History of pneumonia   . Hypertension   . Hypothyroidism   . Internal hemorrhoids    Past Surgical History:  Procedure Laterality Date  . CATARACT EXTRACTION W/ INTRAOCULAR LENS IMPLANT Left   . ENDARTERECTOMY Left 09/09/2019   Procedure: LEFT ILIAC ENDARTERECTOMY;  Surgeon: Serafina Mitchell, MD;  Location: [facility];  Service: Vascular;  Laterality: Left;  . HERNIA REPAIR    . INSERTION OF ILIAC STENT Left 09/09/2019   Procedure: INSERTION OF LEFT ILIAC STENT;  Surgeon: Serafina Mitchell, MD;  Location: [facility];  Service: Vascular;  Laterality: Left;  .  PACEMAKER INSERTION    . THORACIC AORTIC ENDOVASCULAR STENT GRAFT N/A 09/09/2019   Procedure: THORACIC AORTIC ENDOVASCULAR STENT GRAFT WITH LEFT ILIAC  CONDUIT;  Surgeon: Serafina Mitchell, MD;  Location: [facility];  Service: Vascular;  Laterality: N/A;  . TUBAL LIGATION    . ULTRASOUND GUIDANCE FOR VASCULAR ACCESS Left 09/09/2019   Procedure: ULTRASOUND GUIDANCE FOR VASCULAR ACCESS;  Surgeon: Serafina Mitchell, MD;  Location: [facility];  Service: Vascular;  Laterality: Left;  Marland Kitchen VAGINAL HYSTERECTOMY     Social History:  reports that she has been smoking cigarettes. She has a 15.00 pack-year smoking history. She has never used smokeless tobacco. She reports that she does not drink alcohol and does not use drugs.   Family History  Problem Relation Age of Onset  . Prostate cancer Father   . Hypertension Mother   . Heart attack Mother   . Heart disease Mother   . Kidney disease Mother 63  . Stomach cancer Son   . Diabetes Maternal Aunt   . CAD Maternal Aunt   . Stroke Maternal Aunt   . Heart disease Maternal Uncle   . CAD Maternal Uncle   . Stroke Maternal Uncle   . Colon cancer Neg Hx   . Esophageal cancer Neg Hx   . Rectal cancer Neg Hx      Allergies  Allergen Reactions  . Amlodipine Swelling    Started very close to episode of angioedema to ACE, unknown if this added effects or just residual  . Lisinopril     angioedema  . Losartan Swelling    Lip swelling   . Codeine Phosphate Other (See Comments)    REACTION: UNKNOWN  . Morphine And Related Other (See Comments)    Cannot take:Heart problems  . Naproxen Nausea And Vomiting  . Nsaids Nausea Only  . Penicillins     UNKNOWN REACTION  . Propoxyphene N-Acetaminophen Other (See Comments)    Makes me gag  . Sulfamethoxazole-Trimethoprim Other (See Comments)    Gi upset  . Flexeril [Cyclobenzaprine] Anxiety and Other (See Comments)    Pt states medication gives her nightmares and insomnia.     Prior to Admission medications   Medication Sig Start Date End Date Taking? Authorizing Provider  aspirin 81 MG tablet Take 81 mg by mouth every morning.     [provider]   carvedilol (COREG) 25 MG tablet Take 1 tablet (25 mg total) by mouth 2 (two) times daily with a meal. 10/11/19   Evans Lance, MD  cholecalciferol (VITAMIN D) 1000 UNITS tablet Take 1,000 Units by mouth every morning.     [provider]  cloNIDine (CATAPRES) 0.2 MG tablet Take 1 tablet (0.2 mg total) by mouth 3 (three) times daily. 01/30/20   Barton Dubois, MD  clopidogrel (PLAVIX) 75 MG tablet Take 1 tablet (75 mg total) by mouth daily. 01/30/20 01/29/21  Barton Dubois, MD  gabapentin (NEURONTIN) 100 MG capsule Take 1 capsule (100 mg total) by mouth 3 (three) times daily. 09/19/19   Pokhrel, Laxman, MD  glimepiride (AMARYL) 1 MG tablet Take 1 tablet (1 mg total) by mouth every morning. 01/30/20 01/29/21  Barton Dubois, MD  hydrALAZINE (APRESOLINE) 100 MG tablet Take 100 mg by mouth 3 (three) times daily. 09/29/19   [provider]  levothyroxine (SYNTHROID) 25 MCG tablet Take 1 tablet (25 mcg total) by mouth daily. 09/19/19   Pokhrel, Laxman, MD  lidocaine (LMX) 4 % cream Apply topically 4 (four) times  daily as needed (left upper extremity pain). 09/19/19   Pokhrel, Corrie Mckusick, MD  melatonin 3 MG TABS tablet Take 3 tablets (9 mg total) by mouth at bedtime. 09/19/19   Pokhrel, Corrie Mckusick, MD  metFORMIN (GLUCOPHAGE) 500 MG tablet Take 1 tablet by mouth 2 (two) times daily. 05/31/20   [provider]  Multiple Vitamin (MULTIVITAMIN) tablet Take 1 tablet by mouth every morning.     [provider]  nicotine (NICODERM CQ - DOSED IN MG/24 HR) 7 mg/24hr patch Place 1 patch (7 mg total) onto the skin daily. 09/20/19   Pokhrel, Corrie Mckusick, MD  Oxycodone HCl 10 MG TABS Take 10 mg by mouth 4 (four) times daily as needed. 08/08/19   [provider]  pantoprazole (PROTONIX) 40 MG tablet Take 1 tablet (40 mg total) by mouth daily. 09/20/19   Pokhrel, Corrie Mckusick, MD  polyethylene glycol (MIRALAX / GLYCOLAX) packet Take 17 g by mouth 2 (two) times daily.     [provider]   rosuvastatin (CRESTOR) 20 MG tablet Take 1 tablet (20 mg total) by mouth daily. 09/20/19   Pokhrel, Corrie Mckusick, MD  sertraline (ZOLOFT) 100 MG tablet Take 50 mg by mouth at bedtime.    [provider]    Review of Systems:  Constitutional:  No weight loss, night sweats, Fevers, chills, fatigue.  Head&Eyes: No headache.  No vision loss.  No eye pain or scotoma ENT:  No Difficulty swallowing,Tooth/dental problems,Sore throat,  No ear ache, post nasal drip,  Cardio-vascular:  No chest pain, Orthopnea, PND, swelling in lower extremities,  dizziness, palpitations  GI:  No  abdominal pain, nausea, vomiting, diarrhea, loss of appetite, hematochezia, melena, heartburn, indigestion, Resp:  No shortness of breath with exertion. +cough, +SOB no rash or lesions.  GU:  no dysuria, change in color of urine, no urgency or frequency. No flank pain.  Musculoskeletal:  No joint pain or swelling. No decreased range of motion. No back pain.  Psych:  No change in mood or affect. No depression or anxiety. Neurologic: No headache, no dysesthesia, no focal weakness, no vision loss. No syncope  Physical Exam: Vitals:   06/25/20 1245 06/25/20 1300 06/25/20 1325 06/25/20 1338  BP:  (!) 196/101 (!) 228/117 (!) 220/120  Pulse: 63 66 80   Resp: 18 17 20    SpO2: 94% 98% 100%   Weight:       General:  A&O x 3, NAD, nontoxic, pleasant/cooperative Head/Eye: No conjunctival hemorrhage, no icterus, [place]/AT, No nystagmus ENT:  No icterus,  No thrush, good dentition, no pharyngeal exudate Neck:  No masses, no lymphadenpathy, no bruits CV:  RRR, no rub, no gallop, no S3 Lung:  Bilateral wheeze.  Diminished BS.  Bilateral rales. Abdomen: soft/NT, +BS, nondistended, no peritoneal signs Ext: No cyanosis, No rashes, No petechiae, No lymphangitis, No edema Neuro: CNII-XII intact, strength 4/5 in bilateral upper and lower extremities, no dysmetria  Labs on Admission:  Basic Metabolic Panel: Recent Labs  Lab  06/25/20 0919  NA 137  K 4.8  CL 101  CO2 30  GLUCOSE 176*  BUN 30*  CREATININE 1.51*  CALCIUM 8.9   Liver Function Tests: Recent Labs  Lab 06/25/20 0919  AST 32  ALT 21  ALKPHOS 57  BILITOT 0.5  PROT 7.3  ALBUMIN 3.8   Recent Labs  Lab 06/25/20 0919  LIPASE 30   No results for input(s): AMMONIA in the last 168 hours. CBC: Recent Labs  Lab 06/25/20 0919  WBC 8.8  NEUTROABS  6.2  HGB 12.4  HCT 37.9  MCV 94.5  PLT 244   Coagulation Profile: No results for input(s): INR, PROTIME in the last 168 hours. Cardiac Enzymes: No results for input(s): CKTOTAL, CKMB, CKMBINDEX, TROPONINI in the last 168 hours. BNP: Invalid input(s): POCBNP CBG: No results for input(s): GLUCAP in the last 168 hours. Urine analysis:    Component Value Date/Time   COLORURINE YELLOW 06/25/2020 0919   APPEARANCEUR CLEAR 06/25/2020 0919   LABSPEC 1.012 06/25/2020 0919   PHURINE 6.0 06/25/2020 0919   GLUCOSEU 50 (A) 06/25/2020 0919   GLUCOSEU NEGATIVE 02/16/2008 1531   HGBUR NEGATIVE 06/25/2020 0919   BILIRUBINUR NEGATIVE 06/25/2020 0919   KETONESUR NEGATIVE 06/25/2020 0919   PROTEINUR 30 (A) 06/25/2020 0919   UROBILINOGEN 0.2 02/19/2011 1642   NITRITE NEGATIVE 06/25/2020 0919   LEUKOCYTESUR NEGATIVE 06/25/2020 0919   Sepsis Labs: @LABRCNTIP (procalcitonin:4,lacticidven:4) ) Recent Results (from the past 240 hour(s))  Resp Panel by RT-PCR (Flu A&B, Covid)     Status: None   Collection Time: 06/25/20  9:20 AM   Specimen: Nasopharyngeal(NP) swabs in vial transport medium  Result Value Ref Range Status   SARS Coronavirus 2 by RT PCR NEGATIVE NEGATIVE Final    Comment: (NOTE) SARS-CoV-2 target nucleic acids are NOT DETECTED.  The SARS-CoV-2 RNA is generally detectable in upper respiratory specimens during the acute phase of infection. The lowest concentration of SARS-CoV-2 viral copies this assay can detect is 138 copies/mL. A negative result does not preclude  SARS-Cov-2 infection and should not be used as the sole basis for treatment or other patient management decisions. A negative result may occur with  improper specimen collection/handling, submission of specimen other than nasopharyngeal swab, presence of viral mutation(s) within the areas targeted by this assay, and inadequate number of viral copies(<138 copies/mL). A negative result must be combined with clinical observations, patient history, and epidemiological information. The expected result is Negative.  Fact Sheet for Patients:  EntrepreneurPulse.com.au  Fact Sheet for Healthcare Providers:  IncredibleEmployment.be  This test is no t yet approved or cleared by the [place] FDA and  has been authorized for detection and/or diagnosis of SARS-CoV-2 by FDA under an Emergency Use Authorization (EUA). This EUA will remain  in effect (meaning this test can be used) for the duration of the COVID-19 declaration under Section 564(b)(1) of the Act, 21 U.S.C.section 360bbb-3(b)(1), unless the authorization is terminated  or revoked sooner.       Influenza A by PCR NEGATIVE NEGATIVE Final   Influenza B by PCR NEGATIVE NEGATIVE Final    Comment: (NOTE) The Xpert Xpress SARS-CoV-2/FLU/RSV plus assay is intended as an aid in the diagnosis of influenza from Nasopharyngeal swab specimens and should not be used as a sole basis for treatment. Nasal washings and aspirates are unacceptable for Xpert Xpress SARS-CoV-2/FLU/RSV testing.  Fact Sheet for Patients: EntrepreneurPulse.com.au  Fact Sheet for Healthcare Providers: IncredibleEmployment.be  This test is not yet approved or cleared by the [place] FDA and has been authorized for detection and/or diagnosis of SARS-CoV-2 by FDA under an Emergency Use Authorization (EUA). This EUA will remain in effect (meaning this test can be used) for the duration of  the COVID-19 declaration under Section 564(b)(1) of the Act, 21 U.S.C. section 360bbb-3(b)(1), unless the authorization is terminated or revoked.  Performed at [facility], [address], [place], [place] [address]      Radiological Exams on Admission: DG Chest Portable 1 View  Result Date: 06/25/2020 CLINICAL DATA:  Shortness of breath EXAM: PORTABLE CHEST 1 VIEW COMPARISON:  Chest radiograph January 29, 2020; chest CT March 26, 2020 FINDINGS: There is mild bibasilar atelectasis. There is underlying interstitial thickening. There is no frank edema or consolidation. There is stable cardiomegaly with pulmonary vascularity normal. Pacemaker leads attached to right atrium and right ventricle. Stent extending from aortic arch to distal aorta, stable. No adenopathy. Degenerative change in left shoulder. IMPRESSION: Bibasilar atelectasis. Underlying interstitial thickening likely due to underlying chronic bronchitis. No edema or airspace opacity. Stable cardiomegaly with postoperative changes which appear stable. Electronically Signed   By: Lowella Grip III M.D.   On: 06/25/2020 09:38    EKG: Independently reviewed. Paced, nonspecific STT changes    Time spent:60 minutes Code Status:   FULL Family Communication:  No Family at bedside Disposition Plan: expect 1-2 day hospitalization Consults called: none DVT Prophylaxis: [place] Lovenox  Orson Eva, DO  [facility] Pager [phone]  If 7PM-7AM, please contact night-coverage [email or web address] Password [email or web address] 06/25/2020, 2:10 PM

## 2020-06-25 NOTE — ED Notes (Signed)
PA made aware of pts BP being 228/117

## 2020-06-25 NOTE — ED Provider Notes (Signed)
[facility] Provider Note   CSN: [id number] Arrival date & time: 06/25/20  5573     History Chief Complaint  Patient presents with  . Allergic Reaction    [name] is a 70 y.o. female who presents via EMS for lip and tongue swelling that started this morning.  Patient received 50 mg IM Benadryl with EMS, when EMS arrived to her home she was found to be 75% on room air, was placed on nonrebreather.  According to EMS, patient stated that she took lisinopril this morning, has history of angioedema with lisinopril in the past.  However upon arrival to the emergency department she told RN that she did not take lisinopril.  Unfortunately patient is extremely somnolent likely secondary to IM Benadryl.  Cannot rely on her history.  LEVEL 5 CAVEAT due to patient's acuity upon presentation.  I have personally reviewed this patient's medical records.  She has history of angioedema secondary to lisinopril and losartan in the past, history of type 2 diabetes, hypothyroidism, OSA, chronic bronchitis, and paroxysmal A. fib anticoagulated with aspirin and clopidogrel, additionally is on carvedilol.    HPI     Past Medical History:  Diagnosis Date  . Anemia   . Anxiety   . Arthritis   . Atrial fib/flutter, transient   . Cataract   . COPD (chronic obstructive pulmonary disease) ([place])   . Depression   . Diabetes mellitus   . Diverticulosis   . DJD (degenerative joint disease)   . GERD (gastroesophageal reflux disease)   . Gout   . History of pneumonia   . Hypertension   . Hypothyroidism   . Internal hemorrhoids     Patient Active Problem List   Diagnosis Date Noted  . Acute respiratory failure with hypoxia and hypercarbia ([place]) 06/25/2020  . COPD with acute exacerbation ([place]) 06/25/2020  . Angioedema 06/25/2020  . Stroke-like symptoms 01/30/2020  . COVID-19 virus infection   . Numbness and tingling of left lower extremity 01/29/2020  . Paroxysmal  atrial fibrillation ([place]) 01/29/2020  . COPD (chronic obstructive pulmonary disease) ([place])   . AKI (acute kidney injury) ([place])   . Encounter for central line placement   . Hypotension due to drugs   . Hypoxia   . 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 Intramural aortic hematoma ([place]) 08/28/2019  . Aortic aneurysm ([place]) 08/28/2019  . Hypokalemia 05/06/2017  . Abdominal pain 05/06/2017  . Chronic atrial fibrillation ([place]) 03/21/2014  . Enlarged lymph node 12/29/2011  . OTHER&UNSPECIFIED DISEASES THE ORAL SOFT TISSUES 08/17/2008  . GOUT 07/10/2008  . ANKLE PAIN, RIGHT 07/10/2008  . ASTHMATIC BRONCHITIS, ACUTE 05/10/2008  . ABDOMINAL PAIN, UNSPECIFIED SITE 02/16/2008  . KNEE PAIN, BILATERAL 06/17/2007  . DEPRESSIVE DISORDER 12/25/2006  . Pain in limb 12/25/2006  . Lipoma of unspecified site 09/17/2006  . Hypothyroidism 09/17/2006  . Type 2 diabetes mellitus with hyperlipidemia ([place]) 09/17/2006  . OBSTRUCTIVE SLEEP APNEA 09/17/2006  . Hypertension associated with diabetes ([place]) 09/17/2006  . BRADYCARDIA, CHRONIC 09/17/2006  . HERNIA, SITE NOS W/O OBSTRUCTION/GANGRENE 09/17/2006  . DEGENERATIVE JOINT DISEASE, HANDS 09/17/2006  . 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 CATARACT EXTRACTION W/ INTRAOCULAR LENS IMPLANT Left   . ENDARTERECTOMY Left 09/09/2019   Procedure: LEFT ILIAC ENDARTERECTOMY;  Surgeon: Serafina Mitchell, MD;  Location: [facility];  Service: Vascular;  Laterality: Left;  . HERNIA REPAIR    . INSERTION OF ILIAC STENT Left  09/09/2019   Procedure: INSERTION OF LEFT ILIAC STENT;  Surgeon: Serafina Mitchell, MD;  Location: [facility];  Service: Vascular;  Laterality: Left;  . PACEMAKER INSERTION    . THORACIC AORTIC ENDOVASCULAR STENT GRAFT N/A 09/09/2019   Procedure: THORACIC AORTIC ENDOVASCULAR STENT GRAFT WITH LEFT ILIAC CONDUIT;  Surgeon: Serafina Mitchell, MD;  [facility];  Service: Vascular;  Laterality: N/A;  . TUBAL LIGATION    .  ULTRASOUND GUIDANCE FOR VASCULAR ACCESS Left 09/09/2019   Procedure: ULTRASOUND GUIDANCE FOR VASCULAR ACCESS;  Surgeon: Serafina Mitchell, MD;  Location: [facility];  Service: Vascular;  Laterality: Left;  Marland Kitchen VAGINAL HYSTERECTOMY       OB History    Gravida  3   Para      Term      Preterm      AB  1   Living  2     SAB      IAB  1   Ectopic      Multiple      Live Births              Family History  Problem Relation Age of Onset  . Prostate cancer Father   . Hypertension Mother   . Heart attack Mother   . Heart disease Mother   . Kidney disease Mother 39  . Stomach cancer Son   . Diabetes Maternal Aunt   . CAD Maternal Aunt   . Stroke Maternal Aunt   . Heart disease Maternal Uncle   . CAD Maternal Uncle   . Stroke Maternal Uncle   . Colon cancer Neg Hx   . Esophageal cancer Neg Hx   . Rectal cancer Neg Hx     Social History   Tobacco Use  . Smoking status: Current Some Day Smoker    Packs/day: 0.50    Years: 30.00    Pack years: 15.00    Types: Cigarettes  . Smokeless tobacco: Never Used  . Tobacco comment: 10 cigarettes  Vaping Use  . Vaping Use: Never used  Substance Use Topics  . Alcohol use: No  . Drug use: No    Home Medications Prior to Admission medications   Medication Sig Start Date End Date Taking? Authorizing Provider  aspirin 81 MG tablet Take 81 mg by mouth every morning.     [provider]  carvedilol (COREG) 25 MG tablet Take 1 tablet (25 mg total) by mouth 2 (two) times daily with a meal. 10/11/19   Evans Lance, MD  cholecalciferol (VITAMIN D) 1000 UNITS tablet Take 1,000 Units by mouth every morning.     [provider]  cloNIDine (CATAPRES) 0.2 MG tablet Take 1 tablet (0.2 mg total) by mouth 3 (three) times daily. 01/30/20   Barton Dubois, MD  clopidogrel (PLAVIX) 75 MG tablet Take 1 tablet (75 mg total) by mouth daily. 01/30/20 01/29/21  Barton Dubois, MD  gabapentin (NEURONTIN) 100 MG capsule Take 1  capsule (100 mg total) by mouth 3 (three) times daily. 09/19/19   Pokhrel, Laxman, MD  glimepiride (AMARYL) 1 MG tablet Take 1 tablet (1 mg total) by mouth every morning. 01/30/20 01/29/21  Barton Dubois, MD  hydrALAZINE (APRESOLINE) 100 MG tablet Take 100 mg by mouth 3 (three) times daily. 09/29/19   [provider]  levothyroxine (SYNTHROID) 25 MCG tablet Take 1 tablet (25 mcg total) by mouth daily. 09/19/19   Pokhrel, Corrie Mckusick, MD  lidocaine (LMX) 4 % cream Apply topically  4 (four) times daily as needed (left upper extremity pain). 09/19/19   Pokhrel, Corrie Mckusick, MD  melatonin 3 MG TABS tablet Take 3 tablets (9 mg total) by mouth at bedtime. 09/19/19   Pokhrel, Corrie Mckusick, MD  metFORMIN (GLUCOPHAGE) 500 MG tablet Take 1 tablet by mouth 2 (two) times daily. 05/31/20   [provider]  Multiple Vitamin (MULTIVITAMIN) tablet Take 1 tablet by mouth every morning.     [provider]  nicotine (NICODERM CQ - DOSED IN MG/24 HR) 7 mg/24hr patch Place 1 patch (7 mg total) onto the skin daily. 09/20/19   Pokhrel, Corrie Mckusick, MD  Oxycodone HCl 10 MG TABS Take 10 mg by mouth 4 (four) times daily as needed. 08/08/19   [provider]  pantoprazole (PROTONIX) 40 MG tablet Take 1 tablet (40 mg total) by mouth daily. 09/20/19   Pokhrel, Corrie Mckusick, MD  polyethylene glycol (MIRALAX / GLYCOLAX) packet Take 17 g by mouth 2 (two) times daily.     [provider]  rosuvastatin (CRESTOR) 20 MG tablet Take 1 tablet (20 mg total) by mouth daily. 09/20/19   Pokhrel, Corrie Mckusick, MD  sertraline (ZOLOFT) 100 MG tablet Take 50 mg by mouth at bedtime.    [provider]    Allergies    Amlodipine, Lisinopril, Losartan, Codeine phosphate, Morphine and related, Naproxen, Nsaids, Penicillins, Propoxyphene n-acetaminophen, Sulfamethoxazole-trimethoprim, and Flexeril [cyclobenzaprine]  Review of Systems   Review of Systems  Unable to perform ROS: Acuity of condition    Physical Exam Updated Vital  Signs BP (!) 179/83   Pulse 60   Temp 97.7 F (36.5 C)   Resp 17   Wt 76.7 kg   SpO2 100%   BMI 28.12 kg/m   Physical Exam Vitals and nursing note reviewed.  Constitutional:      General: She is in acute distress.     Appearance: She is obese. She is ill-appearing.     Interventions: She is not intubated.Face mask in place.  HENT:     Head: Normocephalic and atraumatic.     Nose: Nose normal.     Mouth/Throat:     Mouth: Mucous membranes are dry. Angioedema present.     Pharynx: Uvula midline. No oropharyngeal exudate or posterior oropharyngeal erythema.     Tonsils: No tonsillar exudate or tonsillar abscesses.     Comments: Lingual and lower lip edema with sublingual tenderness to palpation, no submental tenderness to palpation.  Presentation concerning for angioedema.  Initially patient able to open mouth enough for pharyngeal exam, which was unremarkable. Eyes:     General: Lids are normal.        Right eye: No discharge.        Left eye: No discharge.     Conjunctiva/sclera: Conjunctivae normal.     Pupils: Pupils are equal, round, and reactive to light.  Neck:     Trachea: Trachea and phonation normal.  Cardiovascular:     Rate and Rhythm: Normal rate and regular rhythm.     Pulses: Normal pulses.     Heart sounds: Normal heart sounds. No murmur heard.   Pulmonary:     Effort: Tachypnea and accessory muscle usage present. No bradypnea, prolonged expiration, respiratory distress or retractions. She is not intubated.     Breath sounds: Normal breath sounds. No wheezing or rales.  Chest:     Chest wall: No mass, lacerations, deformity, swelling, tenderness, crepitus or edema.  Abdominal:     General: Bowel sounds are normal. There  is no distension.     Palpations: Abdomen is soft.     Tenderness: There is no abdominal tenderness. There is no right CVA tenderness or left CVA tenderness.  Musculoskeletal:        General: No deformity.     Cervical back: Normal range  of motion and neck supple. No edema, rigidity or crepitus. No pain with movement, spinous process tenderness or muscular tenderness.     Right lower leg: No edema.     Left lower leg: No edema.  Lymphadenopathy:     Cervical: No cervical adenopathy.  Skin:    General: Skin is warm and dry.  Neurological:     Mental Status: She is lethargic.  Psychiatric:        Mood and Affect: Mood normal.     ED Results / Procedures / Treatments   Labs (all labs ordered are listed, but only abnormal results are displayed) Labs Reviewed  COMPREHENSIVE METABOLIC PANEL - Abnormal; Notable for the following components:      Result Value   Glucose, Bld 176 (*)    BUN 30 (*)    Creatinine, Ser 1.51 (*)    GFR, Estimated 37 (*)    All other components within normal limits  CBC WITH DIFFERENTIAL/PLATELET - Abnormal; Notable for the following components:   RDW 19.2 (*)    All other components within normal limits  URINALYSIS, ROUTINE W REFLEX MICROSCOPIC - Abnormal; Notable for the following components:   Glucose, UA 50 (*)    Protein, ur 30 (*)    All other components within normal limits  BLOOD GAS, VENOUS - Abnormal; Notable for the following components:   pCO2, Ven 62.0 (*)    pO2, Ven 60.7 (*)    Acid-Base Excess 3.2 (*)    All other components within normal limits  BLOOD GAS, VENOUS - Abnormal; Notable for the following components:   pO2, Ven 57.2 (*)    Acid-Base Excess 2.9 (*)    All other components within normal limits  GLUCOSE, CAPILLARY - Abnormal; Notable for the following components:   Glucose-Capillary 169 (*)    All other components within normal limits  RESP PANEL BY RT-PCR (FLU A&B, COVID) ARPGX2  MRSA PCR SCREENING  LIPASE, BLOOD  LACTIC ACID, PLASMA  PROCALCITONIN  HEMOGLOBIN A1C  RAPID URINE DRUG SCREEN, HOSP PERFORMED  BASIC METABOLIC PANEL  CBC    EKG EKG: sinus rhythm, no STEMI.  Radiology DG Chest Portable 1 View  Result Date: 06/25/2020 CLINICAL DATA:   Shortness of breath EXAM: PORTABLE CHEST 1 VIEW COMPARISON:  Chest radiograph January 29, 2020; chest CT March 26, 2020 FINDINGS: There is mild bibasilar atelectasis. There is underlying interstitial thickening. There is no frank edema or consolidation. There is stable cardiomegaly with pulmonary vascularity normal. Pacemaker leads attached to right atrium and right ventricle. Stent extending from aortic arch to distal aorta, stable. No adenopathy. Degenerative change in left shoulder. IMPRESSION: Bibasilar atelectasis. Underlying interstitial thickening likely due to underlying chronic bronchitis. No edema or airspace opacity. Stable cardiomegaly with postoperative changes which appear stable. Electronically Signed   By: Lowella Grip III M.D.   On: 06/25/2020 09:38    Procedures Procedures   Medications Ordered in ED Medications  aspirin chewable tablet 81 mg (81 mg Oral Not Given 06/25/20 1449)  oxyCODONE (Oxy IR/ROXICODONE) immediate release tablet 10 mg (has no administration in time range)  carvedilol (COREG) tablet 25 mg (0 mg Oral Hold 06/25/20 1726)  cloNIDine (CATAPRES)  tablet 0.2 mg (0 mg Oral Hold 06/25/20 1533)  hydrALAZINE (APRESOLINE) tablet 100 mg (0 mg Oral Hold 06/25/20 1534)  rosuvastatin (CRESTOR) tablet 20 mg (0 mg Oral Hold 06/25/20 1535)  sertraline (ZOLOFT) tablet 50 mg (has no administration in time range)  levothyroxine (SYNTHROID) tablet 25 mcg (has no administration in time range)  polyethylene glycol (MIRALAX / GLYCOLAX) packet 17 g (has no administration in time range)  pantoprazole (PROTONIX) EC tablet 40 mg (has no administration in time range)  clopidogrel (PLAVIX) tablet 75 mg (0 mg Oral Hold 06/25/20 1533)  melatonin tablet 9 mg (has no administration in time range)  gabapentin (NEURONTIN) capsule 100 mg (0 mg Oral Hold 06/25/20 1534)  cholecalciferol (VITAMIN D3) tablet 1,000 Units (has no administration in time range)  enoxaparin (LOVENOX) injection 40 mg (40 mg  Subcutaneous Given 06/25/20 1755)  acetaminophen (TYLENOL) tablet 650 mg (has no administration in time range)    Or  acetaminophen (TYLENOL) suppository 650 mg (has no administration in time range)  ondansetron (ZOFRAN) tablet 4 mg (has no administration in time range)    Or  ondansetron (ZOFRAN) injection 4 mg (has no administration in time range)  insulin aspart (novoLOG) injection 0-9 Units (2 Units Subcutaneous Given 06/25/20 1755)  hydrALAZINE (APRESOLINE) injection 10 mg (has no administration in time range)  methylPREDNISolone sodium succinate (SOLU-MEDROL) 125 mg/2 mL injection 60 mg (has no administration in time range)  ipratropium-albuterol (DUONEB) 0.5-2.5 (3) MG/3ML nebulizer solution 3 mL (has no administration in time range)  budesonide (PULMICORT) nebulizer solution 0.5 mg (has no administration in time range)  Chlorhexidine Gluconate Cloth 2 % PADS 6 each (6 each Topical Given 06/25/20 1533)  loratadine (CLARITIN) tablet 10 mg (0 mg Oral Hold 06/25/20 1535)  famotidine (PEPCID) tablet 20 mg (has no administration in time range)  methylPREDNISolone sodium succinate (SOLU-MEDROL) 125 mg/2 mL injection 125 mg (125 mg Intravenous Given 06/25/20 0925)  famotidine (PEPCID) IVPB 20 mg premix (0 mg Intravenous Stopped 06/25/20 1138)  EPINEPHrine (EPI-PEN) injection 0.3 mg (0.3 mg Intramuscular Given 06/25/20 0923)  hydrALAZINE (APRESOLINE) injection 10 mg (10 mg Intravenous Given 06/25/20 1348)    ED Course  I have reviewed the triage vital signs and the nursing notes.  Pertinent labs & imaging results that were available during my care of the patient were reviewed by me and considered in my medical decision making (see chart for details).  Clinical Course as of 06/25/20 1845  Mon Jun 25, 2020  1135 Patient more alert, was trialed on 5 L of oxygen by nasal cannula with desaturation to 85%.  Was placed back on nonrebreather by RN.  BiPAP is been ordered at this time given evidence of respiratory  acidosis on VBG. [RS]  7371 Consult to Dr. Carles Collet, hospitalist, who is agreeable to seeing this patient and admitting her to his service.  I appreciate his collaboration in the care of this patient. [RS]    Clinical Course User Index [RS] Kaye Luoma, Sharlene Dory   MDM Rules/Calculators/A&P                         70 year old female presents in acute respiratory distress with lingual and lower lip swelling.  Differential diagnosis includes limited to angioedema, anaphylactic reaction, asthma exacerbation.  Significantly hypertensive on intake, vital signs otherwise normal on nonrebreather mask with 15 L supplemental oxygen.  Cardiac exam is normal, pulmonary exam unremarkable.  Abdominal exam is benign.  HEENT exam did reveal  significant lingual edema as well as edema of the bottom lip, tongue is protruding from the mouth and patient is unable to close her mouth.  No submental or anterior neck tenderness palpation, crepitus, or edema.  Patient is extremely somnolent, possibly from hypercapnia versus Benadryl administration by EMS.  IM epinephrine, Solu-Medrol, and Pepcid were administered in the emergency department.  Patient was placed on end-tidal CO2.  CBC is unremarkable, CMP with creatinine mildly elevated from patient's baseline of 1.2-1.5 today.  UA with proteinuria, lipase is normal.  VBG did reveal respiratory acidosis with pH of 7.294.  Patient was placed on BiPAP.  Patient's hypertension improved after ministration of epinephrine and support with BiPAP, however did subsequently increase again with systolic over 300 and diastolic over 923.  She was administered IV hydralazine with improvement in her BP.  Patient reevaluated and tolerating BiPAP well, is significantly more alert at this time, however still unable to clearly confirm whether she did or did not take lisinopril today, and there is no one in the home to provide further insight into this.  Given history of angioedema in the past  and angioedema today without clear trigger, feels patient would benefit from mission to the hospital for further observation.  Will order repeat VBG and consult hospitalist.  Consult placed to hospitalist as above; Izora Gala voiced understanding for medical evaluation and treatment plan.  Each of her questions was answered to her expressed satisfaction.  She is amenable to plan for admission at this time.  This chart was dictated using voice recognition software, Dragon. Despite the best efforts of this provider to proofread and correct errors, errors may still occur which can change documentation meaning.  Final Clinical Impression(s) / ED Diagnoses Final diagnoses:  None    Rx / DC Orders ED Discharge Orders    None       Emeline Darling, PA-C 06/25/20 1846    Varney Biles, MD 06/26/20 1246

## 2020-06-26 DIAGNOSIS — T783XXD Angioneurotic edema, subsequent encounter: Secondary | ICD-10-CM | POA: Diagnosis not present

## 2020-06-26 DIAGNOSIS — J9602 Acute respiratory failure with hypercapnia: Secondary | ICD-10-CM | POA: Diagnosis not present

## 2020-06-26 DIAGNOSIS — J9601 Acute respiratory failure with hypoxia: Secondary | ICD-10-CM | POA: Diagnosis not present

## 2020-06-26 DIAGNOSIS — I480 Paroxysmal atrial fibrillation: Secondary | ICD-10-CM | POA: Diagnosis not present

## 2020-06-26 LAB — BASIC METABOLIC PANEL
Anion gap: 8 (ref 5–15)
BUN: 31 mg/dL — ABNORMAL HIGH (ref 8–23)
CO2: 27 mmol/L (ref 22–32)
Calcium: 8.7 mg/dL — ABNORMAL LOW (ref 8.9–10.3)
Chloride: 103 mmol/L (ref 98–111)
Creatinine, Ser: 1.37 mg/dL — ABNORMAL HIGH (ref 0.44–1.00)
GFR, Estimated: 42 mL/min — ABNORMAL LOW (ref >60)
Glucose, Bld: 134 mg/dL — ABNORMAL HIGH (ref 70–99)
Potassium: 4.4 mmol/L (ref 3.5–5.1)
Sodium: 138 mmol/L (ref 135–145)

## 2020-06-26 LAB — HEMOGLOBIN A1C
Hgb A1c MFr Bld: 7.4 % — ABNORMAL HIGH (ref 4.8–5.6)
Mean Plasma Glucose: 166 mg/dL

## 2020-06-26 LAB — GLUCOSE, CAPILLARY
Glucose-Capillary: 130 mg/dL — ABNORMAL HIGH (ref 70–99)
Glucose-Capillary: 328 mg/dL — ABNORMAL HIGH (ref 70–99)

## 2020-06-26 LAB — CBC
HCT: 41.2 % (ref 36.0–46.0)
Hemoglobin: 13.1 g/dL (ref 12.0–15.0)
MCH: 29.9 pg (ref 26.0–34.0)
MCHC: 31.8 g/dL (ref 30.0–36.0)
MCV: 94.1 fL (ref 80.0–100.0)
Platelets: 274 10*3/uL (ref 150–400)
RBC: 4.38 MIL/uL (ref 3.87–5.11)
RDW: 19 % — ABNORMAL HIGH (ref 11.5–15.5)
WBC: 9.7 10*3/uL (ref 4.0–10.5)
nRBC: 0 % (ref 0.0–0.2)

## 2020-06-26 LAB — MRSA PCR SCREENING: MRSA by PCR: NEGATIVE

## 2020-06-26 MED ORDER — PREDNISONE 50 MG PO TABS: 50.0000 mg | ORAL_TABLET | Freq: Every day | ORAL | 0 refills | Status: DC

## 2020-06-26 MED ORDER — GUAIFENESIN-DM 100-10 MG/5ML PO SYRP
5.0000 mL | ORAL_SOLUTION | ORAL | Status: DC | PRN
  Administered 2020-06-26: 5 mL via ORAL
  Filled 2020-06-26: qty 5

## 2020-06-26 MED ORDER — PREDNISONE 20 MG PO TABS: 50.0000 mg | ORAL_TABLET | Freq: Every day | ORAL | Status: DC

## 2020-06-26 NOTE — Progress Notes (Signed)
SATURATION QUALIFICATIONS: (This note is used to comply with regulatory documentation for home oxygen)  Patient Saturations on Room Air at Rest = 91%  Patient Saturations on Room Air while Ambulating = 85%  Patient Saturations on 2 Liters of oxygen while Ambulating = 93%  Please briefly explain why patient needs home oxygen: Pt desat's when ambulating with no oxygen on.

## 2020-06-26 NOTE — TOC Initial Note (Signed)
Transition of Care [facility]) - Initial/Assessment Note    Patient Details  Name: [name] MRN: [id number] Date of Birth: [birth year]  Transition of Care [facility]) CM/SW Contact:    Ihor Gully, LCSW Phone Number: 06/26/2020, 12:51 PM  Clinical Narrative:                 Patient from home alone. In need of home oxygen. DME choices discussed. Referral made to [facility]. Portable delivered to room. Concentrator to be delivered to home later today upon patient getting home.    Expected Discharge Plan: Home/Self Care Barriers to Discharge: No Barriers Identified   Patient Goals and CMS Choice Patient states their goals for this hospitalization and ongoing recovery are:: return home      Expected Discharge Plan and Services Expected Discharge Plan: Home/Self Care     Post Acute Care Choice: Durable Medical Equipment Living arrangements for the past 2 months: Single Family Home Expected Discharge Date: 06/26/20               DME Arranged: Oxygen DME Agency: [facility] Date DME Agency Contacted: 06/26/20 Time DME Agency Contacted: 1497 Representative spoke with at DME Agency: Ash[facility]            Prior Living Arrangements/Services Living arrangements for the past 2 months: [place] with:: Self Patient language and need for interpreter reviewed:: Yes Do you feel safe going back to the place where you live?: Yes      Need for Family Participation in Patient Care: Yes (Comment) Care giver support system in place?: Yes (comment)   Criminal Activity/Legal Involvement Pertinent to Current Situation/Hospitalization: No - Comment as needed  Activities of Daily Living      Permission Sought/Granted                  Emotional Assessment     Affect (typically observed): Appropriate Orientation: : Oriented to Self,Oriented to  Time,Oriented to Place,Oriented to Situation Alcohol / Substance Use: Not Applicable Psych Involvement: No (comment)  Admission  diagnosis:  Acute respiratory failure with hypoxia and hypercarbia ([place]) [J96.01, J96.02] Patient Active Problem List   Diagnosis Date Noted  . Acute respiratory failure with hypoxia and hypercarbia ([place]) 06/25/2020  . COPD with acute exacerbation ([place]) 06/25/2020  . Angioedema 06/25/2020  . Stroke-like symptoms 01/30/2020  . COVID-19 virus infection   . Numbness and tingling of left lower extremity 01/29/2020  . Paroxysmal atrial fibrillation ([place]) 01/29/2020  . COPD (chronic obstructive pulmonary disease) ([place])   . AKI (acute kidney injury) ([place])   . Encounter for central line placement   . Hypotension due to drugs   . Hypoxia   . 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 Intramural aortic hematoma ([place]) 08/28/2019  . Aortic aneurysm ([place]) 08/28/2019  . Hypokalemia 05/06/2017  . Abdominal pain 05/06/2017  . Chronic atrial fibrillation ([place]) 03/21/2014  . Enlarged lymph node 12/29/2011  . OTHER&UNSPECIFIED DISEASES THE ORAL SOFT TISSUES 08/17/2008  . GOUT 07/10/2008  . ANKLE PAIN, RIGHT 07/10/2008  . ASTHMATIC BRONCHITIS, ACUTE 05/10/2008  . ABDOMINAL PAIN, UNSPECIFIED SITE 02/16/2008  . KNEE PAIN, BILATERAL 06/17/2007  . DEPRESSIVE DISORDER 12/25/2006  . Pain in limb 12/25/2006  . Lipoma of unspecified site 09/17/2006  . Hypothyroidism 09/17/2006  . Type 2 diabetes mellitus with hyperlipidemia ([place]) 09/17/2006  . OBSTRUCTIVE SLEEP APNEA 09/17/2006  . Hypertension associated with diabetes ([place]) 09/17/2006  . BRADYCARDIA, CHRONIC 09/17/2006  . HERNIA, SITE NOS W/O OBSTRUCTION/GANGRENE 09/17/2006  .  DEGENERATIVE JOINT DISEASE, HANDS 09/17/2006  . 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

## 2020-06-26 NOTE — Discharge Summary (Addendum)
Physician Discharge Summary  [name] WUJ:[id number] DOB: Jan 28, 1950 DOA: 06/25/2020  PCP: Antonietta Jewel, MD  Admit date: 06/25/2020 Discharge date: 06/26/2020  Admitted From: Home Disposition:  Home   Recommendations for Outpatient Follow-up:  1. Follow up with PCP in 1-2 weeks 2. Please obtain BMP/CBC in one week     Discharge Condition: Stable CODE STATUS: FULL Diet recommendation: Heart Healthy / Carb Modified    Brief/Interim Summary: 70 y.o. 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  Upon EMS arrival, the patient was noted to have Dr. Massie Bougie of 75% on room air.  She was given IM Benadryl.  Upon arrival, the patient was somnolent and unable to provide any history.  She was subsequently placed on a nonrebreather and subsequently on BiPAP.  She was given epinephrine, and Solu-Medrol in the emergency department.  Initial VBG showed 7.29/62/60/25. Subsequently, the patient gradually woke up.  She states that she did not take any leftover medications.  She denies taking any losartan or lisinopril.  She states that about 1 week ago, she was placed on some medication regimen for "walking pneumonia" by her PCP.  Otherwise, she has not been placed on any other new medications.  She denies taking any other over-the-counter medications. In the emergency department, the patient was afebrile hemodynamically stable.  Oxygen saturation remains 100% on BiPAP.  BMP showed sodium 137, potassium 4.8, serum creatinine 1.51.  LFTs were unremarkable.  WBC 8.8, hemoglobin 12.4, platelets 244,000.  Chest x-ray showed bilateral scattered interstitial opacities.  COVID RT-PCR was negative.  EKG showed paced rhythm with nonspecific ST ST wave  changes.  Lactic acid 1.1.  UA was negative for pyuria.  Discharge Diagnoses:  Acute respiratory failure with hypoxia and hypercarbia -secondary to hypersensitivity reaction and COPD exacerbation -Initially placed on BiPAP -weaned off BiPAP to [place] -Personally reviewed chest x-ray--scattered bilateral interstitial opacities -ambulatory pulse ox on day of dc showed desaturation <88% -d/c home with 2L -PCT <0.10--->discontinue all antibiotics  Angioedema -Likely secondary to recent Bactrim use -New medications that was started on 06/14/2020--Bactrim DS, clindamycin,  Tessalon Perles, and prednisone -Patient given epinephrine, Solu-Medrol, Benadryl in the ED -resolved -Continue Pepcid -Continue loratadine -Continue IV Solu-Medrol>>d/c home with prednisone -on day of d/c lip and tongue edema resolved and patient able to tolerate diet  COPD exacerbation -Started duo nebs -Started Pulmicort -Continue IV Solu-Medrol>>home with prednisone x 3 more days  CKD 3b -baseline creatinine 1.2-1.5  Tobacco abuse -Tobacco cessation discussed  Chronic pain syndrome -Continue home dose oxycodone -PDMP reviewed--pt receives monthly oxycodone 10 mg #120  Diabetes mellitus type 2 -Hold Amaryl and metformin -NovoLog sliding scale -Hemoglobin A1c--pending at time of dc  Essential hypertension -Restart carvedilol, hydralazine, clonidine  Hypothyroidism -Continue Synthroid  S/p TEVAR -Continue aspirin and Plavix  Hyperlipidemia -Continue statin   Discharge Instructions   Allergies as of 06/26/2020      Reactions   Amlodipine Swelling   Started very close to episode of angioedema to ACE, unknown if this added effects or just residual   Lisinopril    angioedema   Losartan Swelling   Lip swelling   Codeine Phosphate Other (See Comments)   REACTION: UNKNOWN   Morphine And Related Other (See Comments)   Cannot take:Heart problems   Naproxen Nausea And Vomiting   Nsaids  Nausea Only   Penicillins    UNKNOWN REACTION   Propoxyphene N-acetaminophen Other (See Comments)   Makes me gag   Sulfamethoxazole-trimethoprim Other (See Comments)   Gi upset   Flexeril [cyclobenzaprine] Anxiety, Other (See Comments)   Pt states medication gives her nightmares and insomnia.      Medication List    TAKE these medications   aspirin 81 MG tablet Take 81 mg by mouth every morning.   carvedilol 25 MG tablet Commonly known as: Coreg Take 1 tablet (25 mg total) by mouth 2 (two) times daily with a meal.   cholecalciferol 1000 units tablet Commonly known as: VITAMIN D Take 1,000 Units by mouth every morning.   cloNIDine 0.2 MG tablet Commonly known as: CATAPRES Take 1 tablet (0.2 mg total) by mouth 3 (three) times daily.   clopidogrel 75 MG tablet Commonly known as: Plavix Take 1 tablet (75 mg total) by mouth daily.   gabapentin 100 MG capsule Commonly known as: NEURONTIN Take 1 capsule (100 mg total) by mouth 3 (three) times daily.   glimepiride 1 MG tablet Commonly known as: Amaryl Take 1 tablet (1 mg total) by mouth every morning.   hydrALAZINE 100 MG tablet Commonly known as: APRESOLINE Take 100 mg by mouth 3 (three) times daily.   levothyroxine 25 MCG tablet Commonly known as: SYNTHROID Take 1 tablet (25 mcg total) by mouth daily.   lidocaine 4 % cream Commonly known as: LMX Apply topically 4 (four) times daily as needed (left upper extremity pain).   melatonin 3 MG Tabs tablet Take 3 tablets (9 mg total) by mouth at bedtime.   metFORMIN 500 MG tablet Commonly known as: GLUCOPHAGE Take 1 tablet by mouth 2 (two) times daily.   multivitamin tablet Take 1 tablet by mouth every morning.   nicotine 7 mg/24hr patch Commonly known as: NICODERM CQ - dosed in mg/24 hr Place 1 patch (7 mg total) onto the skin daily.   Oxycodone HCl 10 MG Tabs Take 10 mg by mouth 4 (four) times daily as needed.   pantoprazole 40 MG tablet Commonly known as:  PROTONIX Take 1 tablet (40 mg total) by mouth daily.   polyethylene glycol 17 g packet Commonly known as: MIRALAX / GLYCOLAX Take 17 g by mouth 2 (two) times daily.   rosuvastatin 20 MG tablet Commonly known as: CRESTOR Take 1 tablet (20 mg total) by mouth daily.   sertraline 100 MG tablet Commonly known as: ZOLOFT Take 50 mg by mouth at bedtime.       Allergies  Allergen Reactions  . Amlodipine Swelling    Started very close to episode of angioedema to ACE, unknown if this added effects or just residual  . Lisinopril     angioedema  . Losartan Swelling    Lip swelling   . Codeine Phosphate Other (See Comments)    REACTION: UNKNOWN  . Morphine And Related Other (See Comments)    Cannot take:Heart problems  . Naproxen Nausea And Vomiting  . Nsaids Nausea Only  . Penicillins     UNKNOWN REACTION  . Propoxyphene N-Acetaminophen Other (See Comments)    Makes me gag  . Sulfamethoxazole-Trimethoprim Other (See Comments)    Gi upset  . Flexeril [Cyclobenzaprine] Anxiety and Other (See Comments)    Pt states medication gives her nightmares and insomnia.    Consultations:  none   Procedures/Studies: DG Chest Portable 1 View  Result Date: 06/25/2020 CLINICAL DATA:  Shortness of breath EXAM: PORTABLE CHEST  1 VIEW COMPARISON:  Chest radiograph January 29, 2020; chest CT March 26, 2020 FINDINGS: There is mild bibasilar atelectasis. There is underlying interstitial thickening. There is no frank edema or consolidation. There is stable cardiomegaly with pulmonary vascularity normal. Pacemaker leads attached to right atrium and right ventricle. Stent extending from aortic arch to distal aorta, stable. No adenopathy. Degenerative change in left shoulder. IMPRESSION: Bibasilar atelectasis. Underlying interstitial thickening likely due to underlying chronic bronchitis. No edema or airspace opacity. Stable cardiomegaly with postoperative changes which appear stable. Electronically Signed    By: Lowella Grip III M.D.   On: 06/25/2020 09:38        Discharge Exam: Vitals:   06/26/20 0816 06/26/20 0822  BP:    Pulse:    Resp:    Temp: 98.3 F (36.8 C)   SpO2: 94% 97%   Vitals:   06/26/20 0752 06/26/20 0805 06/26/20 0816 06/26/20 0822  BP: (!) 166/93     Pulse: (!) 59     Resp: 16 18    Temp:   98.3 F (36.8 C)   TempSrc:   Axillary   SpO2: 100% 92% 94% 97%  Weight:        General: Pt is alert, awake, not in acute distress Cardiovascular: RRR, S1/S2 +, no rubs, no gallops Respiratory: bibasilar rales, no wheeze Abdominal: Soft, NT, ND, bowel sounds + Extremities: no edema, no cyanosis   The results of significant diagnostics from this hospitalization (including imaging, microbiology, ancillary and laboratory) are listed below for reference.    Significant Diagnostic Studies: DG Chest Portable 1 View  Result Date: 06/25/2020 CLINICAL DATA:  Shortness of breath EXAM: PORTABLE CHEST 1 VIEW COMPARISON:  Chest radiograph January 29, 2020; chest CT March 26, 2020 FINDINGS: There is mild bibasilar atelectasis. There is underlying interstitial thickening. There is no frank edema or consolidation. There is stable cardiomegaly with pulmonary vascularity normal. Pacemaker leads attached to right atrium and right ventricle. Stent extending from aortic arch to distal aorta, stable. No adenopathy. Degenerative change in left shoulder. IMPRESSION: Bibasilar atelectasis. Underlying interstitial thickening likely due to underlying chronic bronchitis. No edema or airspace opacity. Stable cardiomegaly with postoperative changes which appear stable. Electronically Signed   By: Lowella Grip III M.D.   On: 06/25/2020 09:38     Microbiology: Recent Results (from the past 240 hour(s))  Resp Panel by RT-PCR (Flu A&B, Covid)     Status: None   Collection Time: 06/25/20  9:20 AM   Specimen: Nasopharyngeal(NP) swabs in vial transport medium  Result Value Ref Range Status   SARS  Coronavirus 2 by RT PCR NEGATIVE NEGATIVE Final    Comment: (NOTE) SARS-CoV-2 target nucleic acids are NOT DETECTED.  The SARS-CoV-2 RNA is generally detectable in upper respiratory specimens during the acute phase of infection. The lowest concentration of SARS-CoV-2 viral copies this assay can detect is 138 copies/mL. A negative result does not preclude SARS-Cov-2 infection and should not be used as the sole basis for treatment or other patient management decisions. A negative result may occur with  improper specimen collection/handling, submission of specimen other than nasopharyngeal swab, presence of viral mutation(s) within the areas targeted by this assay, and inadequate number of viral copies(<138 copies/mL). A negative result must be combined with clinical observations, patient history, and epidemiological information. The expected result is Negative.  Fact Sheet for Patients:  EntrepreneurPulse.com.au  Fact Sheet for Healthcare Providers:  IncredibleEmployment.be  This test is no t yet approved or cleared by  the [employer] and  has been authorized for detection and/or diagnosis of SARS-CoV-2 by FDA under an Emergency Use Authorization (EUA). This EUA will remain  in effect (meaning this test can be used) for the duration of the COVID-19 declaration under Section 564(b)(1) of the Act, 21 U.S.C.section 360bbb-3(b)(1), unless the authorization is terminated  or revoked sooner.       Influenza A by PCR NEGATIVE NEGATIVE Final   Influenza B by PCR NEGATIVE NEGATIVE Final    Comment: (NOTE) The Xpert Xpress SARS-CoV-2/FLU/RSV plus assay is intended as an aid in the diagnosis of influenza from Nasopharyngeal swab specimens and should not be used as a sole basis for treatment. Nasal washings and aspirates are unacceptable for Xpert Xpress SARS-CoV-2/FLU/RSV testing.  Fact Sheet for  Patients: EntrepreneurPulse.com.au  Fact Sheet for Healthcare Providers: IncredibleEmployment.be  This test is not yet approved or cleared by the [place] FDA and has been authorized for detection and/or diagnosis of SARS-CoV-2 by FDA under an Emergency Use Authorization (EUA). This EUA will remain in effect (meaning this test can be used) for the duration of the COVID-19 declaration under Section 564(b)(1) of the Act, 21 U.S.C. section 360bbb-3(b)(1), unless the authorization is terminated or revoked.  Performed at [facility], [address] [place], [place] [address]      Labs: Basic Metabolic Panel: Recent Labs  Lab 06/25/20 0919 06/26/20 0538  NA 137 138  K 4.8 4.4  CL 101 103  CO2 30 27  GLUCOSE 176* 134*  BUN 30* 31*  CREATININE 1.51* 1.37*  CALCIUM 8.9 8.7*   Liver Function Tests: Recent Labs  Lab 06/25/20 0919  AST 32  ALT 21  ALKPHOS 57  BILITOT 0.5  PROT 7.3  ALBUMIN 3.8   Recent Labs  Lab 06/25/20 0919  LIPASE 30   No results for input(s): AMMONIA in the last 168 hours. CBC: Recent Labs  Lab 06/25/20 0919 06/26/20 0538  WBC 8.8 9.7  NEUTROABS 6.2  --   HGB 12.4 13.1  HCT 37.9 41.2  MCV 94.5 94.1  PLT 244 274   Cardiac Enzymes: No results for input(s): CKTOTAL, CKMB, CKMBINDEX, TROPONINI in the last 168 hours. BNP: Invalid input(s): POCBNP CBG: Recent Labs  Lab 06/25/20 1701 06/25/20 2123 06/26/20 0748  GLUCAP 169* 164* 130*    Time coordinating discharge:  36 minutes  Signed:  Orson Eva, DO [facility] Pager: [phone] 06/26/2020, 8:59 AM

## 2020-06-26 NOTE — Progress Notes (Signed)
Nsg Discharge Note  Admit Date:  06/25/2020 Discharge date: 06/26/2020   [name] to be D/C'd Home per MD order.  AVS completed.  Copy for chart, and copy for patient signed, and dated. Patient/caregiver able to verbalize understanding.  Discharge Medication: Allergies as of 06/26/2020      Reactions   Amlodipine Swelling   Started very close to episode of angioedema to ACE, unknown if this added effects or just residual   Lisinopril    angioedema   Losartan Swelling   Lip swelling   Codeine Phosphate Other (See Comments)   REACTION: UNKNOWN   Morphine And Related Other (See Comments)   Cannot take:Heart problems   Naproxen Nausea And Vomiting   Nsaids Nausea Only   Penicillins    UNKNOWN REACTION   Propoxyphene N-acetaminophen Other (See Comments)   Makes me gag   Sulfamethoxazole-trimethoprim Other (See Comments)   Gi upset   Flexeril [cyclobenzaprine] Anxiety, Other (See Comments)   Pt states medication gives her nightmares and insomnia.      Medication List    TAKE these medications   aspirin 81 MG tablet Take 81 mg by mouth every morning.   carvedilol 25 MG tablet Commonly known as: Coreg Take 1 tablet (25 mg total) by mouth 2 (two) times daily with a meal.   cholecalciferol 1000 units tablet Commonly known as: VITAMIN D Take 1,000 Units by mouth every morning.   cloNIDine 0.2 MG tablet Commonly known as: CATAPRES Take 1 tablet (0.2 mg total) by mouth 3 (three) times daily.   clopidogrel 75 MG tablet Commonly known as: Plavix Take 1 tablet (75 mg total) by mouth daily.   gabapentin 100 MG capsule Commonly known as: NEURONTIN Take 1 capsule (100 mg total) by mouth 3 (three) times daily.   glimepiride 1 MG tablet Commonly known as: Amaryl Take 1 tablet (1 mg total) by mouth every morning.   hydrALAZINE 100 MG tablet Commonly known as: APRESOLINE Take 100 mg by mouth 3 (three) times daily.   levothyroxine 25 MCG tablet Commonly known as:  SYNTHROID Take 1 tablet (25 mcg total) by mouth daily.   lidocaine 4 % cream Commonly known as: LMX Apply topically 4 (four) times daily as needed (left upper extremity pain).   melatonin 3 MG Tabs tablet Take 3 tablets (9 mg total) by mouth at bedtime.   metFORMIN 500 MG tablet Commonly known as: GLUCOPHAGE Take 1 tablet by mouth 2 (two) times daily.   multivitamin tablet Take 1 tablet by mouth every morning.   nicotine 7 mg/24hr patch Commonly known as: NICODERM CQ - dosed in mg/24 hr Place 1 patch (7 mg total) onto the skin daily.   Oxycodone HCl 10 MG Tabs Take 10 mg by mouth 4 (four) times daily as needed.   pantoprazole 40 MG tablet Commonly known as: PROTONIX Take 1 tablet (40 mg total) by mouth daily.   polyethylene glycol 17 g packet Commonly known as: MIRALAX / GLYCOLAX Take 17 g by mouth 2 (two) times daily.   predniSONE 50 MG tablet Commonly known as: DELTASONE Take 1 tablet (50 mg total) by mouth daily with breakfast. Start taking on: June 27, 2020   rosuvastatin 20 MG tablet Commonly known as: CRESTOR Take 1 tablet (20 mg total) by mouth daily.   sertraline 100 MG tablet Commonly known as: ZOLOFT Take 50 mg by mouth at bedtime.            Durable Medical Equipment  (From  admission, onward)         Start     Ordered   06/26/20 1223  For home use only DME oxygen  Once       Question Answer Comment  Length of Need 6 Months   Mode or (Route) Nasal cannula   Liters per Minute 2   Frequency Continuous (stationary and portable oxygen unit needed)   Oxygen conserving device Yes   Oxygen delivery system Gas      06/26/20 1222          Discharge Assessment: Vitals:   06/26/20 0822 06/26/20 0959  BP:  117/76  Pulse:    Resp:  (!) 26  Temp:    SpO2: 97% 90%   Skin clean, dry and intact without evidence of skin break down, no evidence of skin tears noted. IV catheter discontinued intact. Site without signs and symptoms of complications -  no redness or edema noted at insertion site, patient denies c/o pain - only slight tenderness at site.  Dressing with slight pressure applied.  D/c Instructions-Education: Discharge instructions given to patient/family with verbalized understanding. D/c education completed with patient/family including follow up instructions, medication list, d/c activities limitations if indicated, with other d/c instructions as indicated by MD - patient able to verbalize understanding, all questions fully answered. Patient instructed to return to ED, call 911, or call MD for any changes in condition.  Patient escorted via [place], and D/C home via private auto with her home O2.  Carney Corners, RN 06/26/2020 1:23 PM

## 2020-06-27 ENCOUNTER — Other Ambulatory Visit: Payer: Medicare Other

## 2020-06-27 ENCOUNTER — Inpatient Hospital Stay: Admission: RE | Admit: 2020-06-27 | Payer: Medicare Other | Source: Ambulatory Visit

## 2020-07-02 ENCOUNTER — Ambulatory Visit: Payer: Medicare Other | Admitting: Surgery

## 2020-07-11 ENCOUNTER — Ambulatory Visit
Admission: RE | Admit: 2020-07-11 | Discharge: 2020-07-11 | Disposition: A | Discharge status: Home / Self Care | Payer: Medicare Other | Source: Ambulatory Visit | Attending: Surgery | Admitting: Surgery

## 2020-07-11 DIAGNOSIS — I712 Thoracic aortic aneurysm, without rupture, unspecified: Secondary | ICD-10-CM

## 2020-07-11 MED ORDER — IOPAMIDOL (ISOVUE-370) INJECTION 76%
60.0000 mL | Freq: Once | INTRAVENOUS | Status: AC | PRN
  Administered 2020-07-11: 60 mL via INTRAVENOUS

## 2020-07-16 ENCOUNTER — Other Ambulatory Visit: Payer: Self-pay

## 2020-07-16 ENCOUNTER — Encounter: Payer: Self-pay | Admitting: Surgery

## 2020-07-16 ENCOUNTER — Ambulatory Visit (INDEPENDENT_AMBULATORY_CARE_PROVIDER_SITE_OTHER): Payer: Medicare Other | Admitting: Surgery

## 2020-07-16 VITALS — BP 225/113 | HR 64 | Temp 98.0°F | Resp 20 | Ht 65.0 in | Wt 174.0 lb

## 2020-07-16 DIAGNOSIS — I712 Thoracic aortic aneurysm, without rupture, unspecified: Secondary | ICD-10-CM

## 2020-07-16 NOTE — H&P (View-Only) (Signed)
[facility] [place]  Patient name: Shonika Kolasinski MRN: [id number] DOB: Jul 19, 1950 Sex: female   REASON FOR VISIT:    Follow up  [place]:       Shanel Prazak is a 70 y.o. female who presented to the hospital in August 2021 with chest and back pain.  She was very hypertensive.  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  She was initially managed nonoperatively, however we could not get her blood pressure stabilized.  Ultimately on 09/09/2019 she underwent endovascular stent graft placement without left subclavian artery coverage.  This required a conduit off of the left external iliac artery.  She did have plaque at the level of the conduit which was evaluated with angiography from the groin after the procedure, and a stenosis was visualized so this was stented.  On postoperative day 2 she experienced lower extremity paralysis.  A spinal drain was placed and her weakness ultimately resolved.  She did develop a acute episode of renal insufficiency which resolved on its own.   She has had persistent pain in her left groin since the procedure.  She is back for review of her CT scan.   The patient has a diagnosis of A. fib but is only taking aspirin.  She is a current smoker and has COPD.  She is medically managed for hypertension.  She is a diabetic.  She is medically managed for hypertension.  She takes a statin for hypercholesterolemia.   PAST MEDICAL HISTORY:   Past Medical History:  Diagnosis Date   Anemia    Anxiety    Arthritis    Atrial fib/flutter, transient    Cataract    COPD (chronic obstructive pulmonary disease) (HCC)    Depression    Diabetes mellitus    Diverticulosis    DJD (degenerative joint disease)    GERD (gastroesophageal reflux disease)    Gout    History of pneumonia    Hypertension    Hypothyroidism    Internal hemorrhoids      FAMILY  HISTORY:   Family History  Problem Relation Age of Onset   Prostate cancer Father    Hypertension Mother    Heart attack Mother    Heart disease Mother    Kidney disease Mother 54   Stomach cancer Son    Diabetes Maternal Aunt    CAD Maternal Aunt    Stroke Maternal Aunt    Heart disease Maternal Uncle    CAD Maternal Uncle    Stroke Maternal Uncle    Colon cancer Neg Hx    Esophageal cancer Neg Hx    Rectal cancer Neg Hx     SOCIAL HISTORY:   Social History   Tobacco Use   Smoking status: Some Days    Packs/day: 0.50    Years: 30.00    Pack years: 15.00    Types: Cigarettes   Smokeless tobacco: Never   Tobacco comments:    10 cigarettes  Substance Use Topics   Alcohol use: No     ALLERGIES:   Allergies  Allergen Reactions   Amlodipine Swelling    Started very close to episode of angioedema to ACE, unknown if this added effects or just residual   Lisinopril     angioedema   Losartan Swelling    Lip swelling    Codeine Phosphate Other (See Comments)    REACTION: UNKNOWN   Morphine And Related Other (See  Comments)    Cannot take:Heart problems   Naproxen Nausea And Vomiting   Nsaids Nausea Only   Penicillins     UNKNOWN REACTION   Propoxyphene N-Acetaminophen Other (See Comments)    Makes me gag   Sulfamethoxazole-Trimethoprim Other (See Comments)    Gi upset   Flexeril [Cyclobenzaprine] Anxiety and Other (See Comments)    Pt states medication gives her nightmares and insomnia.     CURRENT MEDICATIONS:   Current Outpatient Medications  Medication Sig Dispense Refill   aspirin 81 MG tablet Take 81 mg by mouth every morning.      carvedilol (COREG) 25 MG tablet Take 1 tablet (25 mg total) by mouth 2 (two) times daily with a meal. 180 tablet 3   cholecalciferol (VITAMIN D) 1000 UNITS tablet Take 1,000 Units by mouth every morning.      cloNIDine (CATAPRES) 0.2 MG tablet Take 1 tablet (0.2 mg total) by mouth 3 (three) times daily. 90 tablet 2    clopidogrel (PLAVIX) 75 MG tablet Take 1 tablet (75 mg total) by mouth daily. 30 tablet 3   gabapentin (NEURONTIN) 100 MG capsule Take 1 capsule (100 mg total) by mouth 3 (three) times daily. 90 capsule 2   glimepiride (AMARYL) 1 MG tablet Take 1 tablet (1 mg total) by mouth every morning. 30 tablet 3   hydrALAZINE (APRESOLINE) 100 MG tablet Take 100 mg by mouth 3 (three) times daily.     levothyroxine (SYNTHROID) 25 MCG tablet Take 1 tablet (25 mcg total) by mouth daily. 30 tablet 2   lidocaine (LMX) 4 % cream Apply topically 4 (four) times daily as needed (left upper extremity pain). 30 g 0   melatonin 3 MG TABS tablet Take 3 tablets (9 mg total) by mouth at bedtime. 100 tablet 0   metFORMIN (GLUCOPHAGE) 500 MG tablet Take 1 tablet by mouth 2 (two) times daily.     Multiple Vitamin (MULTIVITAMIN) tablet Take 1 tablet by mouth every morning.      nicotine (NICODERM CQ - DOSED IN MG/24 HR) 7 mg/24hr patch Place 1 patch (7 mg total) onto the skin daily. 28 patch 0   Oxycodone HCl 10 MG TABS Take 10 mg by mouth 4 (four) times daily as needed.     pantoprazole (PROTONIX) 40 MG tablet Take 1 tablet (40 mg total) by mouth daily. 30 tablet 1   polyethylene glycol (MIRALAX / GLYCOLAX) packet Take 17 g by mouth 2 (two) times daily.      predniSONE (DELTASONE) 50 MG tablet Take 1 tablet (50 mg total) by mouth daily with breakfast. 3 tablet 0   rosuvastatin (CRESTOR) 20 MG tablet Take 1 tablet (20 mg total) by mouth daily. 30 tablet 2   sertraline (ZOLOFT) 100 MG tablet Take 50 mg by mouth at bedtime.     No current facility-administered medications for this visit.    REVIEW OF SYSTEMS:   [X]  denotes positive finding, [ ]  denotes negative finding Cardiac  Comments:  Chest pain or chest pressure:    Shortness of breath upon exertion:    Short of breath when lying flat:    Irregular heart rhythm:        Vascular    Pain in calf, thigh, or hip brought on by ambulation:    Pain in feet at night that  wakes you up from your sleep:     Blood clot in your veins:    Leg swelling:  Pulmonary    Oxygen at home:    Productive cough:     Wheezing:         Neurologic    Sudden weakness in arms or legs:     Sudden numbness in arms or legs:     Sudden onset of difficulty speaking or slurred speech:    Temporary loss of vision in one eye:     Problems with dizziness:         Gastrointestinal    Blood in stool:     Vomited blood:         Genitourinary    Burning when urinating:     Blood in urine:        Psychiatric    Major depression:         Hematologic    Bleeding problems:    Problems with blood clotting too easily:        Skin    Rashes or ulcers:        Constitutional    Fever or chills:      PHYSICAL EXAM:   Vitals:   07/16/20 1510  BP: (!) 225/113  Pulse: 64  Resp: 20  Temp: 98 F (36.7 C)  SpO2: 94%  Weight: 174 lb (78.9 kg)  Height: 5\' 5"  (1.651 m)    GENERAL: The patient is a well-nourished female, in no acute distress. The vital signs are documented above. CARDIAC: There is a regular rate and rhythm.  PULMONARY: Non-labored respirations ABDOMEN: Soft and non-tender  MUSCULOSKELETAL: There are no major deformities or cyanosis. NEUROLOGIC: No focal weakness or paresthesias are detected. SKIN: There are no ulcers or rashes noted. PSYCHIATRIC: The patient has a normal affect.  STUDIES:   I have reviewed the following CTA: 1. Continued interval enlargement of the previously visualized pseudoaneurysm arising anteriorly from the proximal aspect of the proximal left external iliac endograft. The pseudoaneurysm is mostly thrombosed and measures up to 5.2 cm in maximum sagittal dimension, previously 4.5 cm by my measurements. The conduit remains patent. 2. Status post descending TEVAR without complicating features. 3. 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 Recommend annual imaging followup  by CTA or MRA. This recommendation follows 2010 ACCF/AHA/AATS/ACR/ASA/SCA/SCAI/SIR/STS/SVM Guidelines for the Diagnosis and Management of Patients with Thoracic Aortic Disease. Circulation. 2010; 121: R007-M226. Aortic aneurysm NOS (ICD10-I71.9) 4. Severe coronary atherosclerotic calcifications.   NON VASCULAR   1. No acute abnormality in the chest, abdomen, or pelvis. 2. Mild colonic diverticulosis.    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 This will need to be followed up in 1 year with a CT angiogram.  Left iliac pseudoaneurysm: I had initially thought that this was related to her iliac conduit, however this has increased in size, now measuring 5.2 cm up from 4.5.  This potentially could be causing some nerve compression contributing to her left leg pain.  I discussed that I think this needs to be covered given that it is expanding.  We have scheduled her arteriogram for next week.  I will do an aortogram with bilateral runoff and plan for covered stenting of her iliac artery.     Leia Alf, MD, FACS [facility] [phone] Pager [phone]

## 2020-07-16 NOTE — Progress Notes (Signed)
[facility]in Specialist of [place]  Patient name: [name] MRN: [id number] DOB: 06/05/1950 Sex: female   REASON FOR VISIT:    Follow up  [place]:       [name] is a 70 y.o. female who presented to the hospital in August 2021 with chest and back pain.  She was very hypertensive.  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  She was initially managed nonoperatively, however we could not get her blood pressure stabilized.  Ultimately on 09/09/2019 she underwent endovascular stent graft placement without left subclavian artery coverage.  This required a conduit off of the left external iliac artery.  She did have plaque at the level of the conduit which was evaluated with angiography from the groin after the procedure, and a stenosis was visualized so this was stented.  On postoperative day 2 she experienced lower extremity paralysis.  A spinal drain was placed and her weakness ultimately resolved.  She did develop a acute episode of renal insufficiency which resolved on its own.   She has had persistent pain in her left groin since the procedure.  She is back for review of her CT scan.   The patient has a diagnosis of A. fib but is only taking aspirin.  She is a current smoker and has COPD.  She is medically managed for hypertension.  She is a diabetic.  She is medically managed for hypertension.  She takes a statin for hypercholesterolemia.   PAST MEDICAL HISTORY:   Past Medical History:  Diagnosis Date   Anemia    Anxiety    Arthritis    Atrial fib/flutter, transient    Cataract    COPD (chronic obstructive pulmonary disease) (HCC)    Depression    Diabetes mellitus    Diverticulosis    DJD (degenerative joint disease)    GERD (gastroesophageal reflux disease)    Gout    History of pneumonia    Hypertension    Hypothyroidism    Internal hemorrhoids      FAMILY  HISTORY:   Family History  Problem Relation Age of Onset   Prostate cancer Father    Hypertension Mother    Heart attack Mother    Heart disease Mother    Kidney disease Mother 22   Stomach cancer Son    Diabetes Maternal Aunt    CAD Maternal Aunt    Stroke Maternal Aunt    Heart disease Maternal Uncle    CAD Maternal Uncle    Stroke Maternal Uncle    Colon cancer Neg Hx    Esophageal cancer Neg Hx    Rectal cancer Neg Hx     SOCIAL HISTORY:   Social History   Tobacco Use   Smoking status: Some Days    Packs/day: 0.50    Years: 30.00    Pack years: 15.00    Types: Cigarettes   Smokeless tobacco: Never   Tobacco comments:    10 cigarettes  Substance Use Topics   Alcohol use: No     ALLERGIES:   Allergies  Allergen Reactions   Amlodipine Swelling    Started very close to episode of angioedema to ACE, unknown if this added effects or just residual   Lisinopril     angioedema   Losartan Swelling    Lip swelling    Codeine Phosphate Other (See Comments)    REACTION: UNKNOWN   Morphine And Related Other (See  Comments)    Cannot take:Heart problems   Naproxen Nausea And Vomiting   Nsaids Nausea Only   Penicillins     UNKNOWN REACTION   Propoxyphene N-Acetaminophen Other (See Comments)    Makes me gag   Sulfamethoxazole-Trimethoprim Other (See Comments)    Gi upset   Flexeril [Cyclobenzaprine] Anxiety and Other (See Comments)    Pt states medication gives her nightmares and insomnia.     CURRENT MEDICATIONS:   Current Outpatient Medications  Medication Sig Dispense Refill   aspirin 81 MG tablet Take 81 mg by mouth every morning.      carvedilol (COREG) 25 MG tablet Take 1 tablet (25 mg total) by mouth 2 (two) times daily with a meal. 180 tablet 3   cholecalciferol (VITAMIN D) 1000 UNITS tablet Take 1,000 Units by mouth every morning.      cloNIDine (CATAPRES) 0.2 MG tablet Take 1 tablet (0.2 mg total) by mouth 3 (three) times daily. 90 tablet 2    clopidogrel (PLAVIX) 75 MG tablet Take 1 tablet (75 mg total) by mouth daily. 30 tablet 3   gabapentin (NEURONTIN) 100 MG capsule Take 1 capsule (100 mg total) by mouth 3 (three) times daily. 90 capsule 2   glimepiride (AMARYL) 1 MG tablet Take 1 tablet (1 mg total) by mouth every morning. 30 tablet 3   hydrALAZINE (APRESOLINE) 100 MG tablet Take 100 mg by mouth 3 (three) times daily.     levothyroxine (SYNTHROID) 25 MCG tablet Take 1 tablet (25 mcg total) by mouth daily. 30 tablet 2   lidocaine (LMX) 4 % cream Apply topically 4 (four) times daily as needed (left upper extremity pain). 30 g 0   melatonin 3 MG TABS tablet Take 3 tablets (9 mg total) by mouth at bedtime. 100 tablet 0   metFORMIN (GLUCOPHAGE) 500 MG tablet Take 1 tablet by mouth 2 (two) times daily.     Multiple Vitamin (MULTIVITAMIN) tablet Take 1 tablet by mouth every morning.      nicotine (NICODERM CQ - DOSED IN MG/24 HR) 7 mg/24hr patch Place 1 patch (7 mg total) onto the skin daily. 28 patch 0   Oxycodone HCl 10 MG TABS Take 10 mg by mouth 4 (four) times daily as needed.     pantoprazole (PROTONIX) 40 MG tablet Take 1 tablet (40 mg total) by mouth daily. 30 tablet 1   polyethylene glycol (MIRALAX / GLYCOLAX) packet Take 17 g by mouth 2 (two) times daily.      predniSONE (DELTASONE) 50 MG tablet Take 1 tablet (50 mg total) by mouth daily with breakfast. 3 tablet 0   rosuvastatin (CRESTOR) 20 MG tablet Take 1 tablet (20 mg total) by mouth daily. 30 tablet 2   sertraline (ZOLOFT) 100 MG tablet Take 50 mg by mouth at bedtime.     No current facility-administered medications for this visit.    REVIEW OF SYSTEMS:   [X]  denotes positive finding, [ ]  denotes negative finding Cardiac  Comments:  Chest pain or chest pressure:    Shortness of breath upon exertion:    Short of breath when lying flat:    Irregular heart rhythm:        Vascular    Pain in calf, thigh, or hip brought on by ambulation:    Pain in feet at night that  wakes you up from your sleep:     Blood clot in your veins:    Leg swelling:  Pulmonary    Oxygen at home:    Productive cough:     Wheezing:         Neurologic    Sudden weakness in arms or legs:     Sudden numbness in arms or legs:     Sudden onset of difficulty speaking or slurred speech:    Temporary loss of vision in one eye:     Problems with dizziness:         Gastrointestinal    Blood in stool:     Vomited blood:         Genitourinary    Burning when urinating:     Blood in urine:        Psychiatric    Major depression:         Hematologic    Bleeding problems:    Problems with blood clotting too easily:        Skin    Rashes or ulcers:        Constitutional    Fever or chills:      PHYSICAL EXAM:   Vitals:   07/16/20 1510  BP: (!) 225/113  Pulse: 64  Resp: 20  Temp: 98 F (36.7 C)  SpO2: 94%  Weight: 174 lb (78.9 kg)  Height: 5\' 5"  (1.651 m)    GENERAL: The patient is a well-nourished female, in no acute distress. The vital signs are documented above. CARDIAC: There is a regular rate and rhythm.  PULMONARY: Non-labored respirations ABDOMEN: Soft and non-tender  MUSCULOSKELETAL: There are no major deformities or cyanosis. NEUROLOGIC: No focal weakness or paresthesias are detected. SKIN: There are no ulcers or rashes noted. PSYCHIATRIC: The patient has a normal affect.  STUDIES:   I have reviewed the following CTA: 1. Continued interval enlargement of the previously visualized pseudoaneurysm arising anteriorly from the proximal aspect of the proximal left external iliac endograft. The pseudoaneurysm is mostly thrombosed and measures up to 5.2 cm in maximum sagittal dimension, previously 4.5 cm by my measurements. The conduit remains patent. 2. Status post descending TEVAR without complicating features. 3. 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 Recommend annual imaging followup  by CTA or MRA. This recommendation follows 2010 ACCF/AHA/AATS/ACR/ASA/SCA/SCAI/SIR/STS/SVM Guidelines for the Diagnosis and Management of Patients with Thoracic Aortic Disease. Circulation. 2010; 121: S010-X323. Aortic aneurysm NOS (ICD10-I71.9) 4. Severe coronary atherosclerotic calcifications.   NON VASCULAR   1. No acute abnormality in the chest, abdomen, or pelvis. 2. Mild colonic diverticulosis.    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 This will need to be followed up in 1 year with a CT angiogram.  Left iliac pseudoaneurysm: I had initially thought that this was related to her iliac conduit, however this has increased in size, now measuring 5.2 cm up from 4.5.  This potentially could be causing some nerve compression contributing to her left leg pain.  I discussed that I think this needs to be covered given that it is expanding.  We have scheduled her arteriogram for next week.  I will do an aortogram with bilateral runoff and plan for covered stenting of her iliac artery.     Leia Alf, MD, FACS [facility] of [facility] [phone] Pager [phone]

## 2020-07-17 ENCOUNTER — Other Ambulatory Visit: Payer: Self-pay

## 2020-07-24 ENCOUNTER — Ambulatory Visit (HOSPITAL_COMMUNITY)
Admission: RE | Admit: 2020-07-24 | Discharge: 2020-07-24 | Disposition: A | Discharge status: Home / Self Care | Payer: Medicare Other | Attending: Surgery | Admitting: Surgery

## 2020-07-24 ENCOUNTER — Encounter (HOSPITAL_COMMUNITY)
Admission: RE | Disposition: A | Discharge status: Home / Self Care | Payer: Self-pay | Source: Home / Self Care | Attending: Surgery

## 2020-07-24 ENCOUNTER — Other Ambulatory Visit: Payer: Self-pay

## 2020-07-24 DIAGNOSIS — I4891 Unspecified atrial fibrillation: Secondary | ICD-10-CM | POA: Insufficient documentation

## 2020-07-24 DIAGNOSIS — Z79890 Hormone replacement therapy (postmenopausal): Secondary | ICD-10-CM | POA: Insufficient documentation

## 2020-07-24 DIAGNOSIS — Z880 Allergy status to penicillin: Secondary | ICD-10-CM | POA: Diagnosis not present

## 2020-07-24 DIAGNOSIS — Z882 Allergy status to sulfonamides status: Secondary | ICD-10-CM | POA: Insufficient documentation

## 2020-07-24 DIAGNOSIS — F17210 Nicotine dependence, cigarettes, uncomplicated: Secondary | ICD-10-CM | POA: Insufficient documentation

## 2020-07-24 DIAGNOSIS — I723 Aneurysm of iliac artery: Secondary | ICD-10-CM

## 2020-07-24 DIAGNOSIS — Z7902 Long term (current) use of antithrombotics/antiplatelets: Secondary | ICD-10-CM | POA: Diagnosis not present

## 2020-07-24 DIAGNOSIS — I10 Essential (primary) hypertension: Secondary | ICD-10-CM | POA: Insufficient documentation

## 2020-07-24 DIAGNOSIS — E7800 Pure hypercholesterolemia, unspecified: Secondary | ICD-10-CM | POA: Diagnosis not present

## 2020-07-24 DIAGNOSIS — Z886 Allergy status to analgesic agent status: Secondary | ICD-10-CM | POA: Insufficient documentation

## 2020-07-24 DIAGNOSIS — Z79899 Other long term (current) drug therapy: Secondary | ICD-10-CM | POA: Insufficient documentation

## 2020-07-24 DIAGNOSIS — J449 Chronic obstructive pulmonary disease, unspecified: Secondary | ICD-10-CM | POA: Diagnosis not present

## 2020-07-24 DIAGNOSIS — Z7984 Long term (current) use of oral hypoglycemic drugs: Secondary | ICD-10-CM | POA: Diagnosis not present

## 2020-07-24 DIAGNOSIS — E1151 Type 2 diabetes mellitus with diabetic peripheral angiopathy without gangrene: Secondary | ICD-10-CM | POA: Diagnosis not present

## 2020-07-24 DIAGNOSIS — Z888 Allergy status to other drugs, medicaments and biological substances status: Secondary | ICD-10-CM | POA: Diagnosis not present

## 2020-07-24 DIAGNOSIS — Z7982 Long term (current) use of aspirin: Secondary | ICD-10-CM | POA: Diagnosis not present

## 2020-07-24 DIAGNOSIS — Z885 Allergy status to narcotic agent status: Secondary | ICD-10-CM | POA: Insufficient documentation

## 2020-07-24 HISTORY — PX: PERIPHERAL VASCULAR INTERVENTION: CATH118257

## 2020-07-24 HISTORY — PX: ABDOMINAL AORTOGRAM W/LOWER EXTREMITY: CATH118223

## 2020-07-24 LAB — GLUCOSE, CAPILLARY: Glucose-Capillary: 107 mg/dL — ABNORMAL HIGH (ref 70–99)

## 2020-07-24 LAB — POCT I-STAT, CHEM 8
BUN: 22 mg/dL (ref 8–23)
Calcium, Ion: 1.19 mmol/L (ref 1.15–1.40)
Chloride: 101 mmol/L (ref 98–111)
Creatinine, Ser: 1.4 mg/dL — ABNORMAL HIGH (ref 0.44–1.00)
Glucose, Bld: 104 mg/dL — ABNORMAL HIGH (ref 70–99)
HCT: 35 % — ABNORMAL LOW (ref 36.0–46.0)
Hemoglobin: 11.9 g/dL — ABNORMAL LOW (ref 12.0–15.0)
Potassium: 3.2 mmol/L — ABNORMAL LOW (ref 3.5–5.1)
Sodium: 142 mmol/L (ref 135–145)
TCO2: 29 mmol/L (ref 22–32)

## 2020-07-24 SURGERY — ABDOMINAL AORTOGRAM W/LOWER EXTREMITY
Anesthesia: LOCAL

## 2020-07-24 MED ORDER — ONDANSETRON HCL 4 MG/2ML IJ SOLN: 4.0000 mg | Freq: Four times a day (QID) | INTRAMUSCULAR | Status: DC | PRN

## 2020-07-24 MED ORDER — OXYCODONE HCL 5 MG PO TABS
5.0000 mg | ORAL_TABLET | ORAL | Status: DC | PRN
Start: 2020-07-24 — End: 2020-07-24

## 2020-07-24 MED ORDER — HEPARIN SODIUM (PORCINE) 1000 UNIT/ML IJ SOLN
INTRAMUSCULAR | Status: DC | PRN
  Administered 2020-07-24: 5000 [IU] via INTRAVENOUS

## 2020-07-24 MED ORDER — IODIXANOL 320 MG/ML IV SOLN
INTRAVENOUS | Status: DC | PRN
  Administered 2020-07-24: 40 mL via INTRA_ARTERIAL

## 2020-07-24 MED ORDER — SODIUM CHLORIDE 0.9% FLUSH: 3.0000 mL | Freq: Two times a day (BID) | INTRAVENOUS | Status: DC

## 2020-07-24 MED ORDER — LIDOCAINE HCL (PF) 1 % IJ SOLN
INTRAMUSCULAR | Status: AC
  Filled 2020-07-24: qty 30

## 2020-07-24 MED ORDER — SODIUM CHLORIDE 0.9% FLUSH: 3.0000 mL | INTRAVENOUS | Status: DC | PRN

## 2020-07-24 MED ORDER — FENTANYL CITRATE (PF) 100 MCG/2ML IJ SOLN
INTRAMUSCULAR | Status: DC | PRN
  Administered 2020-07-24: 50 ug via INTRAVENOUS

## 2020-07-24 MED ORDER — FENTANYL CITRATE (PF) 100 MCG/2ML IJ SOLN
INTRAMUSCULAR | Status: AC
  Filled 2020-07-24: qty 2

## 2020-07-24 MED ORDER — HEPARIN (PORCINE) IN NACL 1000-0.9 UT/500ML-% IV SOLN
INTRAVENOUS | Status: AC
  Filled 2020-07-24: qty 1000

## 2020-07-24 MED ORDER — HYDRALAZINE HCL 20 MG/ML IJ SOLN
INTRAMUSCULAR | Status: AC
  Filled 2020-07-24: qty 1

## 2020-07-24 MED ORDER — LABETALOL HCL 5 MG/ML IV SOLN: 10.0000 mg | INTRAVENOUS | Status: DC | PRN

## 2020-07-24 MED ORDER — SODIUM CHLORIDE 0.9 % IV SOLN: INTRAVENOUS | Status: DC

## 2020-07-24 MED ORDER — HEPARIN SODIUM (PORCINE) 1000 UNIT/ML IJ SOLN
INTRAMUSCULAR | Status: AC
  Filled 2020-07-24: qty 1

## 2020-07-24 MED ORDER — SODIUM CHLORIDE 0.9 % IV SOLN: 250.0000 mL | INTRAVENOUS | Status: DC | PRN

## 2020-07-24 MED ORDER — SODIUM CHLORIDE 0.9 % WEIGHT BASED INFUSION: 1.0000 mL/kg/h | INTRAVENOUS | Status: DC

## 2020-07-24 MED ORDER — MIDAZOLAM HCL 2 MG/2ML IJ SOLN
INTRAMUSCULAR | Status: DC | PRN
  Administered 2020-07-24: 2 mg via INTRAVENOUS

## 2020-07-24 MED ORDER — MIDAZOLAM HCL 2 MG/2ML IJ SOLN
INTRAMUSCULAR | Status: AC
  Filled 2020-07-24: qty 2

## 2020-07-24 MED ORDER — ASPIRIN EC 81 MG PO TBEC: 81.0000 mg | DELAYED_RELEASE_TABLET | Freq: Every day | ORAL | Status: DC

## 2020-07-24 MED ORDER — HEPARIN (PORCINE) IN NACL 1000-0.9 UT/500ML-% IV SOLN
INTRAVENOUS | Status: DC | PRN
  Administered 2020-07-24 (×2): 500 mL

## 2020-07-24 MED ORDER — LIDOCAINE HCL (PF) 1 % IJ SOLN
INTRAMUSCULAR | Status: DC | PRN
  Administered 2020-07-24: 15 mL via INTRADERMAL

## 2020-07-24 MED ORDER — HYDRALAZINE HCL 20 MG/ML IJ SOLN
INTRAMUSCULAR | Status: DC | PRN
  Administered 2020-07-24: 10 mg via INTRAVENOUS

## 2020-07-24 MED ORDER — HYDRALAZINE HCL 20 MG/ML IJ SOLN
5.0000 mg | INTRAMUSCULAR | Status: AC | PRN
  Administered 2020-07-24 (×2): 5 mg via INTRAVENOUS
  Filled 2020-07-24: qty 1

## 2020-07-24 SURGICAL SUPPLY — 14 items
BALLN MUSTANG 9X40X75 (BALLOONS) ×3
BALLOON MUSTANG 9X40X75 (BALLOONS) ×2 IMPLANT
CATH OMNI FLUSH 5F 65CM (CATHETERS) ×3 IMPLANT
CLOSURE PERCLOSE PROSTYLE (VASCULAR PRODUCTS) ×3 IMPLANT
KIT MICROPUNCTURE NIT STIFF (SHEATH) ×3 IMPLANT
KIT PV (KITS) ×3 IMPLANT
SHEATH BRITE TIP 8FR 35CM (SHEATH) ×6 IMPLANT
SHEATH PINNACLE 5F 10CM (SHEATH) ×3 IMPLANT
STENT VIABAHN 10X5X120 (Permanent Stent) ×3 IMPLANT
SYR MEDRAD MARK V 150ML (SYRINGE) ×3 IMPLANT
TRANSDUCER W/STOPCOCK (MISCELLANEOUS) ×3 IMPLANT
TRAY PV CATH (CUSTOM PROCEDURE TRAY) ×3 IMPLANT
WIRE BENTSON .035X145CM (WIRE) ×3 IMPLANT
WIRE HI TORQ VERSACORE J 260CM (WIRE) ×3 IMPLANT

## 2020-07-24 NOTE — Interval H&P Note (Signed)
History and Physical Interval Note:  07/24/2020 7:56 AM  [name]  has presented today for surgery, with the diagnosis of pad.  The various methods of treatment have been discussed with the patient and family. After consideration of risks, benefits and other options for treatment, the patient has consented to  Procedure(s): ABDOMINAL AORTOGRAM W/LOWER EXTREMITY (N/A) as a surgical intervention.  The patient's history has been reviewed, patient examined, no change in status, stable for surgery.  I have reviewed the patient's chart and labs.  Questions were answered to the patient's satisfaction.     [name]

## 2020-07-24 NOTE — Progress Notes (Signed)
Pt ambulated at 1345, and after getting dressed, L groin site had a slight ooze. Dressing changed and pt ambulated again at 1415. Dressing clean, dry, and intact at time of DC.   Discharged home with her neighbor who will drive and her cousin, Enid Derry, who will stay with pt x 24 hrs.

## 2020-07-24 NOTE — Op Note (Signed)
    Patient name: [name] MRN: [id number] DOB: 1950/04/03 Sex: female  07/24/2020 Pre-operative Diagnosis: Iliac artery pseudoaneurysm Post-operative diagnosis:  Same Surgeon:  [name] Procedure Performed:  1.  Ultrasound-guided access, left femoral artery  2.  Abdominal aortogram  3.  Stent, left external iliac artery  4.  Conscious sedation, 47 minutes  5.  Closure device, Pro-glide     Indications: This is a 70 year old female who has previously undergone thoracic stent grafting via a retroperitoneal conduit.  She has a enlarging pseudoaneurysm at the level of the conduit that I initially thought was retained conduit however since it is gotten larger I felt that it needed to be addressed.  Procedure:  The patient was identified in the holding area and taken to room 8.  The patient was then placed supine on the table and prepped and draped in the usual sterile fashion.  A time out was called.  Conscious sedation was administered with the use of IV fentanyl and Versed under continuous physician and nurse monitoring.  Heart rate, blood pressure, and oxygen saturation were continuously monitored.  Total sedation time was 47 minutes.  Ultrasound was used to evaluate the left common femoral artery.  It was patent .  A digital ultrasound image was acquired.  A micropuncture needle was used to access the left common femoral artery under ultrasound guidance.  An 018 wire was advanced without resistance and a micropuncture sheath was placed.  The 018 wire was removed and a benson wire was placed.  The micropuncture sheath was exchanged for a 5 french sheath.  An omniflush catheter was advanced over the wire to the level of L-1.  An abdominal angiogram was obtained.   Findings:   Aortogram: No significant renal artery stenosis was identified.  The infrarenal abdominal aorta is widely patent.  Bilateral common and external iliac arteries are widely patent.  A stent is visualized within the  left external iliac artery with the appearance of a pseudoaneurysm   Intervention: After the above images were acquired the decision was made to proceed with intervention.  An 8 French sheath was inserted.  The patient was given 5000 units of heparin.  A 10 x 50 Viabahn stent was deployed across the area of concern and then deployed.  It was postdilated with a 9 mm balloon.  Completion imaging showed resolution of the pseudoaneurysm with no further evidence of bleed.  The groin was then closed with a Pro-glide without complication  Impression:  #1  Successful covered stenting of a left external iliac artery pseudoaneurysm using a 10 x 40 Viabahn   The patient will return turn to clinic and 1 month for a duplex and then will need a CT angiogram of the chest abdomen pelvis and follow-up to see me 6 months later.   Theotis Burrow, M.D., [facility] of [place] Office: [phone] Pager:  [phone]

## 2020-07-25 ENCOUNTER — Encounter (HOSPITAL_COMMUNITY): Payer: Self-pay | Admitting: Surgery

## 2020-07-25 LAB — POCT I-STAT, CHEM 8
BUN: 37 mg/dL — ABNORMAL HIGH (ref 8–23)
Calcium, Ion: 1.07 mmol/L — ABNORMAL LOW (ref 1.15–1.40)
Chloride: 100 mmol/L (ref 98–111)
Creatinine, Ser: 1.3 mg/dL — ABNORMAL HIGH (ref 0.44–1.00)
Glucose, Bld: 101 mg/dL — ABNORMAL HIGH (ref 70–99)
HCT: 37 % (ref 36.0–46.0)
Hemoglobin: 12.6 g/dL (ref 12.0–15.0)
Potassium: 7 mmol/L (ref 3.5–5.1)
Sodium: 139 mmol/L (ref 135–145)
TCO2: 34 mmol/L — ABNORMAL HIGH (ref 22–32)

## 2020-08-17 ENCOUNTER — Other Ambulatory Visit: Payer: Self-pay

## 2020-08-17 DIAGNOSIS — I712 Thoracic aortic aneurysm, without rupture, unspecified: Secondary | ICD-10-CM

## 2020-08-26 NOTE — Progress Notes (Signed)
HISTORY AND PHYSICAL     CC:  follow up. Requesting Provider:  Antonietta Jewel, MD  HPI: This is a 70 y.o. female who is here today for follow up for PAD.   who presented to the hospital in August 2021 with chest and back pain.  She was very hypertensive.  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  She was initially managed nonoperatively, however we could not get her blood pressure stabilized.  Ultimately on 09/09/2019 she underwent endovascular stent graft placement without left subclavian artery coverage.  This required a conduit off of the left external iliac artery.  She did have plaque at the level of the conduit which was evaluated with angiography from the groin after the procedure, and a stenosis was visualized so this was stented.  On postoperative day 2 she experienced lower extremity paralysis.  A spinal drain was placed and her weakness ultimately resolved.  She did develop a acute episode of renal insufficiency which resolved on its own.   She has had persistent pain in her left groin since the procedure.  On 07/24/2020, she underwent aortogram with stenting of the left EIA by Dr. Trula Slade.   Successful covered stenting of a left external iliac artery pseudoaneurysm using a 10 x 40 Viabahn.  Dr. Trula Slade wanted her to return in 4 weeks and then f/u in 6 months after that with CTA of chest/abdomen/pelvis and see Dr. Trula Slade.    The pt returns today for follow up.  She states the pain in her left groin has improved, but continues to burning on the left thigh.    The pt is on a statin for cholesterol management.    The pt is on an aspirin.    Other AC:  Plavix The pt is on BB, clonidine, HCTZ for hypertension.  The pt does not have diabetes. Tobacco hx:  current but has cut back to arround 7 cigarettes per day.  Her neighbor that helps her out told her he could tell she had cut back bc there is less cigarette smell.    Past Medical History:  Diagnosis Date    Anemia    Anxiety    Arthritis    Atrial fib/flutter, transient    Cataract    COPD (chronic obstructive pulmonary disease) (HCC)    Depression    Diabetes mellitus    Diverticulosis    DJD (degenerative joint disease)    GERD (gastroesophageal reflux disease)    Gout    History of pneumonia    Hypertension    Hypothyroidism    Internal hemorrhoids     Past Surgical History:  Procedure Laterality Date   ABDOMINAL AORTOGRAM W/LOWER EXTREMITY N/A 07/24/2020   Procedure: ABDOMINAL AORTOGRAM W/LOWER EXTREMITY;  Surgeon: Serafina Mitchell, MD;  Location: [place] [facility];  Service: Cardiovascular;  Laterality: N/A;   CATARACT EXTRACTION W/ INTRAOCULAR LENS IMPLANT Left    ENDARTERECTOMY Left 09/09/2019   Procedure: LEFT ILIAC ENDARTERECTOMY;  Surgeon: Serafina Mitchell, MD;  Location: [facility];  Service: Vascular;  Laterality: Left;   HERNIA REPAIR     INSERTION OF ILIAC STENT Left 09/09/2019   Procedure: INSERTION OF LEFT ILIAC STENT;  Surgeon: Serafina Mitchell, MD;  Location: [facility];  Service: Vascular;  Laterality: Left;   PACEMAKER INSERTION     PERIPHERAL VASCULAR INTERVENTION Left 07/24/2020   Procedure: PERIPHERAL VASCULAR INTERVENTION;  Surgeon: Serafina Mitchell, MD;  Location: [place] [facility];  Service: Cardiovascular;  Laterality: Left;   THORACIC AORTIC ENDOVASCULAR STENT GRAFT N/A 09/09/2019   Procedure: THORACIC AORTIC ENDOVASCULAR STENT GRAFT WITH LEFT ILIAC CONDUIT;  Surgeon: Serafina Mitchell, MD;  Location: [facility];  Service: Vascular;  Laterality: N/A;   TUBAL LIGATION     ULTRASOUND GUIDANCE FOR VASCULAR ACCESS Left 09/09/2019   Procedure: ULTRASOUND GUIDANCE FOR VASCULAR ACCESS;  Surgeon: Serafina Mitchell, MD;  Location: [facility];  Service: Vascular;  Laterality: Left;   VAGINAL HYSTERECTOMY      Allergies  Allergen Reactions   Amlodipine Swelling    Started very close to episode of angioedema to ACE, unknown if this added effects or just residual   Lisinopril      angioedema   Losartan Swelling    Lip swelling    Codeine Phosphate Other (See Comments)    REACTION: UNKNOWN   Morphine And Related Other (See Comments)    Cannot take:Heart problems   Naproxen Nausea And Vomiting   Nsaids Nausea Only   Penicillins Other (See Comments)    UNKNOWN REACTION   Propoxyphene N-Acetaminophen Other (See Comments)    Makes me gag   Sulfamethoxazole-Trimethoprim Other (See Comments)    Gi upset   Flexeril [Cyclobenzaprine] Anxiety and Other (See Comments)    Pt states medication gives her nightmares and insomnia.    Current Outpatient Medications  Medication Sig Dispense Refill   aspirin 81 MG tablet Take 81 mg by mouth every morning.      carvedilol (COREG) 25 MG tablet Take 1 tablet (25 mg total) by mouth 2 (two) times daily with a meal. 180 tablet 3   cholecalciferol (VITAMIN D) 1000 UNITS tablet Take 1,000 Units by mouth every morning.      cloNIDine (CATAPRES) 0.2 MG tablet Take 1 tablet (0.2 mg total) by mouth 3 (three) times daily. 90 tablet 2   clopidogrel (PLAVIX) 75 MG tablet Take 1 tablet (75 mg total) by mouth daily. 30 tablet 3   gabapentin (NEURONTIN) 100 MG capsule Take 1 capsule (100 mg total) by mouth 3 (three) times daily. 90 capsule 2   glimepiride (AMARYL) 1 MG tablet Take 1 tablet (1 mg total) by mouth every morning. 30 tablet 3   hydrALAZINE (APRESOLINE) 100 MG tablet Take 100 mg by mouth 3 (three) times daily.     hydrochlorothiazide (HYDRODIURIL) 12.5 MG tablet Take 12.5 mg by mouth daily.     levothyroxine (SYNTHROID) 25 MCG tablet Take 1 tablet (25 mcg total) by mouth daily. 30 tablet 2   lidocaine (LMX) 4 % cream Apply topically 4 (four) times daily as needed (left upper extremity pain). 30 g 0   lovastatin (MEVACOR) 20 MG tablet Take 20 mg by mouth at bedtime.     Multiple Vitamin (MULTIVITAMIN) tablet Take 1 tablet by mouth every morning.      Oxycodone HCl 10 MG TABS Take 10 mg by mouth 4 (four) times daily as needed.      OXYGEN Inhale 1 L into the lungs at bedtime.     pantoprazole (PROTONIX) 40 MG tablet Take 1 tablet (40 mg total) by mouth daily. 30 tablet 1   polyethylene glycol (MIRALAX / GLYCOLAX) packet Take 17 g by mouth daily.     rosuvastatin (CRESTOR) 20 MG tablet Take 1 tablet (20 mg total) by mouth daily. 30 tablet 2   sertraline (ZOLOFT) 100 MG tablet Take 100 mg by mouth at bedtime.     No current facility-administered medications for this visit.  Family History  Problem Relation Age of Onset   Prostate cancer Father    Hypertension Mother    Heart attack Mother    Heart disease Mother    Kidney disease Mother 8   Stomach cancer Son    Diabetes Maternal Aunt    CAD Maternal Aunt    Stroke Maternal Aunt    Heart disease Maternal Uncle    CAD Maternal Uncle    Stroke Maternal Uncle    Colon cancer Neg Hx    Esophageal cancer Neg Hx    Rectal cancer Neg Hx     Social History   Socioeconomic History   Marital status: Widowed    Spouse name: Not on file   Number of children: 2   Years of education: Not on file   Highest education level: Not on file  Occupational History   Occupation: Disabled    Employer: UNEMPLOYED  Tobacco Use   Smoking status: Some Days    Packs/day: 0.50    Years: 30.00    Pack years: 15.00    Types: Cigarettes   Smokeless tobacco: Never   Tobacco comments:    10 cigarettes  Vaping Use   Vaping Use: Never used  Substance and Sexual Activity   Alcohol use: No   Drug use: No   Sexual activity: Yes    Birth control/protection: Surgical  Other Topics Concern   Not on file  Social History Narrative   Not on file   Social Determinants of Health   Financial Resource Strain: Medium Risk   Difficulty of Paying Living Expenses: Somewhat hard  Food Insecurity: No Food Insecurity   Worried About Charity fundraiser in the Last Year: Never true   Ran Out of Food in the Last Year: Never true  Transportation Needs: No Transportation Needs   Lack of  Transportation (Medical): No   Lack of Transportation (Non-Medical): No  Physical Activity: Unknown   Days of Exercise per Week: Not on file   Minutes of Exercise per Session: 20 min  Stress: Stress Concern Present   Feeling of Stress : Rather much  Social Connections: Not on file  Intimate Partner Violence: Not on file     REVIEW OF SYSTEMS:   '[X]'$  denotes positive finding, '[ ]'$  denotes negative finding Cardiac  Comments:  Chest pain or chest pressure:    Shortness of breath upon exertion:    Short of breath when lying flat:    Irregular heart rhythm:        Vascular    Pain in calf, thigh, or hip brought on by ambulation:    Pain in feet at night that wakes you up from your sleep:     Blood clot in your veins:    Leg swelling:         Pulmonary    Oxygen at home:    Productive cough:     Wheezing:         Neurologic    Sudden weakness in arms or legs:     Sudden numbness in arms or legs:     Sudden onset of difficulty speaking or slurred speech:    Temporary loss of vision in one eye:     Problems with dizziness:         Gastrointestinal    Blood in stool:     Vomited blood:         Genitourinary    Burning when urinating:  Blood in urine:        Psychiatric    Major depression:         Hematologic    Bleeding problems:    Problems with blood clotting too easily:        Skin    Rashes or ulcers:        Constitutional    Fever or chills:      PHYSICAL EXAMINATION:  Today's Vitals   08/27/20 0922  BP: (!) 186/92  Pulse: 68  Resp: 16  Temp: 97.9 F (36.6 C)  TempSrc: Temporal  SpO2: 94%  Weight: 172 lb (78 kg)  Height: '5\' 5"'$  (1.651 m)  PainSc: 4    Body mass index is 28.62 kg/m.   General:  WDWN in NAD; vital signs documented above Gait: Not observed HENT: WNL, normocephalic Pulmonary: normal non-labored breathing , without wheezing Cardiac: regular HR, without  Murmur; without carotid bruits Abdomen: soft, NT Skin: without  rashes Vascular Exam/Pulses:  Right Left  Femoral 2+ (normal) 2+ (normal)  DP 1+ (weak) 2+ (normal)  PT Unable to palpate Unable to palpate   Extremities: without ischemic changes, without Gangrene , without cellulitis; without open wounds;  Musculoskeletal: no muscle wasting or atrophy  Neurologic: A&O X 3;  No focal weakness or paresthesias are detected Psychiatric:  The pt has Normal affect.   Non-Invasive Vascular Imaging:   Arterial duplex on 08/27/2020: Left Stent(s):  +----------------------+--------+--------+--------+--------+  external iliac artery.PSV cm/sStenosisWaveformComments  +----------------------+--------+--------+--------+--------+  Prox to Stent         98              biphasic          +----------------------+--------+--------+--------+--------+  Proximal Stent        90              biphasic          +----------------------+--------+--------+--------+--------+  Mid Stent             88              biphasic          +----------------------+--------+--------+--------+--------+  Distal Stent          107             biphasic          +----------------------+--------+--------+--------+--------+  Distal to Stent       182             biphasic          +----------------------+--------+--------+--------+--------+  Summary:  Patent left EIA stent.     ASSESSMENT/PLAN:: 70 y.o. female here for follow up for endovascular stent graft placement without left subclavian artery coverage.  This required a conduit off of the left external iliac artery in August 2021 by Dr. Trula Slade and subsequent covered stenting of a left external iliac artery pseudoaneurysm using a 10 x 40 Viabahn on 07/24/2020 also by Dr. Trula Slade  -pt's left EIA stent is patent.  The pain in her left groin has improved but she continues to have some burning sensations on the left thigh.  Dr. Trula Slade discussed with pt that given the dilation of the artery, it could've caused  some nerve irritation and hopefully this will get better with time.   -BP is elevated today-she has not had her blood pressure medication this morning and will take it when she leaves here today. -pt will f/u in 6 months with CTA chest/abdomen/pelvis.   Leontine Locket,  [facility] [phone]  Clinic MD:   pt seen with Dr. Trula Slade

## 2020-08-27 ENCOUNTER — Ambulatory Visit (HOSPITAL_COMMUNITY)
Admission: RE | Admit: 2020-08-27 | Discharge: 2020-08-27 | Disposition: A | Discharge status: Home / Self Care | Payer: Medicare Other | Source: Ambulatory Visit | Attending: Surgery | Admitting: Surgery

## 2020-08-27 ENCOUNTER — Ambulatory Visit (INDEPENDENT_AMBULATORY_CARE_PROVIDER_SITE_OTHER): Payer: Medicare Other | Admitting: Physician Assistant

## 2020-08-27 ENCOUNTER — Other Ambulatory Visit: Payer: Self-pay

## 2020-08-27 VITALS — BP 186/92 | HR 68 | Temp 97.9°F | Resp 16 | Ht 65.0 in | Wt 172.0 lb

## 2020-08-27 DIAGNOSIS — I712 Thoracic aortic aneurysm, without rupture, unspecified: Secondary | ICD-10-CM

## 2020-08-31 ENCOUNTER — Institutional Professional Consult (permissible substitution): Payer: Medicare Other | Admitting: Pulmonary Disease

## 2020-10-16 ENCOUNTER — Encounter: Payer: Self-pay | Admitting: Pulmonary Disease

## 2020-10-16 ENCOUNTER — Other Ambulatory Visit: Payer: Self-pay

## 2020-10-16 ENCOUNTER — Telehealth: Payer: Self-pay | Admitting: Pulmonary Disease

## 2020-10-16 ENCOUNTER — Ambulatory Visit (INDEPENDENT_AMBULATORY_CARE_PROVIDER_SITE_OTHER): Payer: Medicare Other | Admitting: Pulmonary Disease

## 2020-10-16 VITALS — BP 138/88 | HR 74 | Temp 99.0°F | Ht 63.39 in | Wt 172.4 lb

## 2020-10-16 DIAGNOSIS — J9601 Acute respiratory failure with hypoxia: Secondary | ICD-10-CM

## 2020-10-16 DIAGNOSIS — J209 Acute bronchitis, unspecified: Secondary | ICD-10-CM | POA: Diagnosis not present

## 2020-10-16 DIAGNOSIS — J9602 Acute respiratory failure with hypercapnia: Secondary | ICD-10-CM | POA: Diagnosis not present

## 2020-10-16 MED ORDER — NICOTINE POLACRILEX 2 MG MT LOZG: 2.0000 mg | LOZENGE | OROMUCOSAL | 3 refills | Status: DC | PRN

## 2020-10-16 MED ORDER — NICOTINE 14 MG/24HR TD PT24: 14.0000 mg | MEDICATED_PATCH | Freq: Every day | TRANSDERMAL | 11 refills | Status: DC

## 2020-10-16 NOTE — Telephone Encounter (Signed)
Called and spoke with patient to let her know that looking at her AVS from today Dr. Silas Flood was ordering an ONO and that would tell us if she still needed to wear her oxygen at night, but for her to continue to wear her oxygen until she has the test done and once we got the results back we would let her know. She expressed understanding. Nothing further needed at this time.

## 2020-10-16 NOTE — Progress Notes (Signed)
@Patient  ID: Stephannie Li, female    DOB: 08-Sep-1950, 70 y.o.   MRN: [id number]  No chief complaint on file.   Referring provider: Antonietta Jewel, MD  HPI:   70 y.o. woman whom we are seeing in consultation for evaluation of history of oxygen use.  Note from referring provider reviewed.  Discharge summary 06/2020 reviewed.  Patient was mid to the hospital 06/2020 with severe allergic/near anaphylactic reaction.  Seem to be the antibiotic.  VBG showed hypercarbia PCO2 in the 60s.  Reportedly hypoxemic.  Very wheezy.  Treated with supportive care.  Received antihistamines, epinephrine injection, steroids.  Gradually improved.  Was discharged on 2 L oxygen after approximate 24 hours in the hospital.  Relatively fast discharge especially with new oxygen requirement.  Quickly stopped using at home.  Notes checks her oxygen saturation.  Lowest this gets as 93% during the day when she is walking around.  She continues 1 L at night.  She does this because she was told to.  No overnight oximetry.  She endorses ongoing smoking of cigarettes.  Has cut down.  Wishes to decrease further.  Cites son's illness in [place] stressor.  She denies any real breathing issues.  Prior to that acute illness, no breathing issues.  No respiratory issues.  No dyspnea on exertion.  He is not limited in any way from exertional capacity.  Occasional cough but not bothersome.  Never had PFTs per her report.  Review chest x-ray during admission 06-2020 on my interpretation reveals clear lungs bilaterally.   PMH: Tobacco abuse in remission Surgical history: Hysterectomy, tubal ligation, hernia repair, endarterectomy Family history: Father with prostate cancer, mother with CAD, hypertension, CKD Social history: Current smoker, does not have a pack a day.  50-pack-year smoking history, smoked a pack a day for 50 years before cutting down recently, lives in [place] / Pulmonary Flowsheets:   ACT:  No  flowsheet data found.  MMRC: No flowsheet data found.  Epworth:  No flowsheet data found.  Tests:   FENO:  No results found for: NITRICOXIDE  PFT: No flowsheet data found.  WALK:  No flowsheet data found.  Imaging: Personally reviewed and as per EMR discussion this note  Lab Results: Personally reviewed, elevated eosinophils notably CBC    Component Value Date/Time   WBC 9.7 06/26/2020 0538   RBC 4.38 06/26/2020 0538   HGB 11.9 (L) 07/24/2020 0809   HCT 35.0 (L) 07/24/2020 0809   PLT 274 06/26/2020 0538   MCV 94.1 06/26/2020 0538   MCH 29.9 06/26/2020 0538   MCHC 31.8 06/26/2020 0538   RDW 19.0 (H) 06/26/2020 0538   LYMPHSABS 1.2 06/25/2020 0919   MONOABS 0.9 06/25/2020 0919   EOSABS 0.4 06/25/2020 0919   BASOSABS 0.1 06/25/2020 0919    BMET    Component Value Date/Time   NA 142 07/24/2020 0809   K 3.2 (L) 07/24/2020 0809   CL 101 07/24/2020 0809   CO2 27 06/26/2020 0538   GLUCOSE 104 (H) 07/24/2020 0809   BUN 22 07/24/2020 0809   CREATININE 1.40 (H) 07/24/2020 0809   CALCIUM 8.7 (L) 06/26/2020 0538   GFRNONAA 42 (L) 06/26/2020 0538   GFRAA 48 (L) 09/19/2019 0140    BNP    Component Value Date/Time   BNP 147.0 (H) 01/29/2020 0040    ProBNP No results found for: PROBNP  Specialty Problems       Pulmonary Problems   OBSTRUCTIVE SLEEP APNEA  Qualifier: Diagnosis of  By: Jenny Reichmann MD, Hunt Oris       ASTHMATIC BRONCHITIS, ACUTE    Qualifier: Diagnosis of  By: Jenny Reichmann MD, Hunt Oris       Hypoxia   COPD (chronic obstructive pulmonary disease) ([place])   Acute respiratory failure with hypoxia and hypercarbia ([place])   COPD with acute exacerbation (HCC)    Allergies  Allergen Reactions   Amlodipine Swelling    Started very close to episode of angioedema to ACE, unknown if this added effects or just residual   Lisinopril     angioedema   Losartan Swelling    Lip swelling    Codeine Phosphate Other (See Comments)    REACTION: UNKNOWN    Morphine And Related Other (See Comments)    Cannot take:Heart problems   Naproxen Nausea And Vomiting   Nsaids Nausea Only   Penicillins Other (See Comments)    UNKNOWN REACTION   Propoxyphene N-Acetaminophen Other (See Comments)    Makes me gag   Sulfamethoxazole-Trimethoprim Other (See Comments)    Gi upset   Flexeril [Cyclobenzaprine] Anxiety and Other (See Comments)    Pt states medication gives her nightmares and insomnia.    Immunization History  Administered Date(s) Administered   H1N1 02/16/2008   Influenza Whole 11/22/2008   PFIZER(Purple Top)SARS-COV-2 Vaccination 06/25/2019, 08/22/2019   Pfizer Covid-19 Vaccine Bivalent Booster 48yrs & up 02/24/2020, 09/21/2020   Pneumococcal Polysaccharide-23 05/21/2006    Past Medical History:  Diagnosis Date   Anemia    Anxiety    Arthritis    Atrial fib/flutter, transient    Cataract    COPD (chronic obstructive pulmonary disease) (HCC)    Depression    Diabetes mellitus    Diverticulosis    DJD (degenerative joint disease)    GERD (gastroesophageal reflux disease)    Gout    History of pneumonia    Hypertension    Hypothyroidism    Internal hemorrhoids     Tobacco History: Social History   Tobacco Use  Smoking Status Some Days   Packs/day: 1.00   Years: 50.00   Pack years: 50.00   Types: Cigarettes  Smokeless Tobacco Never  Tobacco Comments   10 cigarettes   Ready to quit: Not Answered Counseling given: Not Answered Tobacco comments: 10 cigarettes   Outpatient Encounter Medications as of 10/16/2020  Medication Sig   aspirin 81 MG tablet Take 81 mg by mouth every morning.    carvedilol (COREG) 25 MG tablet Take 1 tablet (25 mg total) by mouth 2 (two) times daily with a meal.   cholecalciferol (VITAMIN D) 1000 UNITS tablet Take 1,000 Units by mouth every morning.    cloNIDine (CATAPRES) 0.2 MG tablet Take 1 tablet (0.2 mg total) by mouth 3 (three) times daily.   clopidogrel (PLAVIX) 75 MG tablet Take 1  tablet (75 mg total) by mouth daily.   gabapentin (NEURONTIN) 100 MG capsule Take 1 capsule (100 mg total) by mouth 3 (three) times daily.   glimepiride (AMARYL) 1 MG tablet Take 1 tablet (1 mg total) by mouth every morning.   hydrALAZINE (APRESOLINE) 100 MG tablet Take 100 mg by mouth 3 (three) times daily.   hydrochlorothiazide (HYDRODIURIL) 12.5 MG tablet Take 12.5 mg by mouth daily.   levothyroxine (SYNTHROID) 25 MCG tablet Take 1 tablet (25 mcg total) by mouth daily.   lovastatin (MEVACOR) 20 MG tablet Take 20 mg by mouth at bedtime.   Multiple Vitamin (MULTIVITAMIN) tablet Take 1 tablet by mouth  every morning.    nicotine (NICODERM CQ - DOSED IN MG/24 HOURS) 14 mg/24hr patch Place 1 patch (14 mg total) onto the skin daily.   nicotine polacrilex (NICORETTE MINI) 2 MG lozenge Take 1 lozenge (2 mg total) by mouth as needed for smoking cessation.   Oxycodone HCl 10 MG TABS Take 10 mg by mouth 4 (four) times daily as needed.   OXYGEN Inhale 1 L into the lungs at bedtime.   pantoprazole (PROTONIX) 40 MG tablet Take 1 tablet (40 mg total) by mouth daily.   polyethylene glycol (MIRALAX / GLYCOLAX) packet Take 17 g by mouth daily.   rosuvastatin (CRESTOR) 20 MG tablet Take 1 tablet (20 mg total) by mouth daily.   sertraline (ZOLOFT) 100 MG tablet Take 100 mg by mouth at bedtime.   lidocaine (LMX) 4 % cream Apply topically 4 (four) times daily as needed (left upper extremity pain). (Patient not taking: Reported on 10/16/2020)   No facility-administered encounter medications on file as of 10/16/2020.     Review of Systems  Review of Systems  No chest with exertion.  No orthopnea or PND.  Comprehensive review of systems otherwise negative. Physical Exam  BP 138/88 (BP Location: Left Arm, Cuff Size: Normal)   Pulse 74   Temp 99 F (37.2 C) (Oral)   Ht 5' 3.39" (1.61 m)   Wt 172 lb 6.4 oz (78.2 kg)   SpO2 99%   BMI 30.17 kg/m   Wt Readings from Last 5 Encounters:  10/16/20 172 lb 6.4 oz  (78.2 kg)  08/27/20 172 lb (78 kg)  07/24/20 170 lb (77.1 kg)  07/16/20 174 lb (78.9 kg)  06/26/20 190 lb 7.6 oz (86.4 kg)    BMI Readings from Last 5 Encounters:  10/16/20 30.17 kg/m  08/27/20 28.62 kg/m  07/24/20 28.29 kg/m  07/16/20 28.96 kg/m  06/26/20 31.70 kg/m    Physical Exam General: Well-appearing, no acute distress Eyes: EOMI, icterus Neck: Supple, no JVP Pulmonary: Distant, clear, normal work of breathing Cardiovascular: Regular in rhythm, no murmur Abdomen: Nondistended, bowel sounds present MSK: No synovitis, no joint effusion Neuro: Normal gait, no weakness Psych: Normal mood, full affect   Assessment & Plan:   History of severe allergic reaction/anaphylaxis: Presented with hypercarbia and hypoxemia.  Suspect this was acute bronchospasm.  Possible underlying COPD.  PFTs ordered for further evaluation.  Tobacco abuse: Smoking assessment and cessation counseling Patient currently smoking. I have advised the patient to quit/stop smoking as soon as possible due to high risk for multiple medical problems.  It will also be very difficult for [place] to manage patient's  respiratory symptoms and status if we continue to expose her lungs to a known irritant.  We do not advise e-cigarettes as a form of stopping smoking. Patient is willing to quit smoking. I have advised the patient that we can assist and have options of nicotine replacement therapy, provided smoking cessation education today, provided smoking cessation counseling, and provided cessation resources.  Prescribed nicotine patch and nicotine lozenges.  Advise gradual reduction in cigarettes every week or 2.  She expressed understanding.   Nocturnal hypoxemia: Unclear if real.  Was discharged on oxygen after acute bronchospasm/allergic reaction.  Not using during the day.  Sats staying in the mid 90s, low 90s at worst.  Using 1 L at night.  Overnight oximetry ordered for further evaluation.  Return in about 3  months (around 01/15/2021).   Lanier Clam, MD 10/16/2020

## 2020-10-16 NOTE — Patient Instructions (Signed)
Nice to meet you  We will get pulmonary function test to see if you have COPD or not  Given you do not have any symptoms, I do not think we need starting medicines now or even if the tests are abnormal  I ordered an overnight oximetry to see if your oxygen is dropping at night.  If it is staying up, we can get rid of the oxygen.  Return to clinic in 3 months or sooner as needed

## 2020-11-08 ENCOUNTER — Telehealth: Payer: Self-pay | Admitting: Pulmonary Disease

## 2020-11-08 NOTE — Telephone Encounter (Signed)
Reviewed overnight oximetry 10/29/2020.  On room air.  She did have desaturations as low as 86%.  Recommend she continue using 1 L nasal cannula at nighttime when she sleeps. Please inform the patient.

## 2020-11-09 NOTE — Telephone Encounter (Signed)
The patient called back and she voices understanding. Nothing further needed.

## 2020-11-09 NOTE — Telephone Encounter (Signed)
I called the patient to give her results and I had to leave a message to call back.

## 2021-01-04 ENCOUNTER — Telehealth: Payer: Self-pay

## 2021-01-04 NOTE — Telephone Encounter (Signed)
LVM on cell and home phone for patient to call device clinic to schedule appointment to have her device checked as she has not been seen since 2016.

## 2021-01-07 ENCOUNTER — Telehealth: Payer: Self-pay

## 2021-01-07 NOTE — Telephone Encounter (Signed)
Spoke with patient regarding lost to follow up appointment patient agreeable to apt with [facility] on 02/05/21 at 1:00pm in [facility]

## 2021-01-15 ENCOUNTER — Ambulatory Visit: Payer: Medicare Other | Admitting: Pulmonary Disease

## 2021-02-05 ENCOUNTER — Encounter: Payer: Self-pay | Admitting: Internal Medicine

## 2021-02-05 ENCOUNTER — Telehealth: Payer: Self-pay

## 2021-02-05 ENCOUNTER — Ambulatory Visit (INDEPENDENT_AMBULATORY_CARE_PROVIDER_SITE_OTHER): Payer: Medicare Other | Admitting: Internal Medicine

## 2021-02-05 ENCOUNTER — Other Ambulatory Visit: Payer: Self-pay

## 2021-02-05 VITALS — BP 160/98 | HR 83 | Ht 65.0 in | Wt 176.0 lb

## 2021-02-05 DIAGNOSIS — I480 Paroxysmal atrial fibrillation: Secondary | ICD-10-CM | POA: Diagnosis not present

## 2021-02-05 NOTE — Telephone Encounter (Signed)
I have called [employer] to order new monitor for patient and it will arrive in 7-10 days.

## 2021-02-05 NOTE — Patient Instructions (Signed)
Medication Instructions:  Your physician recommends that you continue on your current medications as directed. Please refer to the Current Medication list given to you today.   Labwork: None   Testing/Procedures: None   Follow-Up: 1 year  Any Other Special Instructions Will Be Listed Below (If Applicable).  If you need a refill on your cardiac medications before your next appointment, please call your pharmacy.

## 2021-02-05 NOTE — Progress Notes (Signed)
HPI [name] returns for followup of sinus node dysfunction s/p PPM insertion. She is a pleasant 71 yo woman with a h/o peripheral vascular disease, HTN, sinus node dysfunction, s/p PPM insertion. She has been treated for an intramural hematoma with stenting by Dr. Trula Slade. At discharge her bp meds were reduced. Since she has been home, she has felt ok but notes that her bp is not well controlled. No edema, chest pain or syncope. She is now on both plavix and ASA. Allergies  Allergen Reactions   Amlodipine Swelling    Started very close to episode of angioedema to ACE, unknown if this added effects or just residual   Lisinopril     angioedema   Losartan Swelling    Lip swelling    Codeine Phosphate Other (See Comments)    REACTION: UNKNOWN   Morphine And Related Other (See Comments)    Cannot take:Heart problems   Naproxen Nausea And Vomiting   Nsaids Nausea Only   Penicillins Other (See Comments)    UNKNOWN REACTION   Propoxyphene N-Acetaminophen Other (See Comments)    Makes me gag   Sulfamethoxazole-Trimethoprim Other (See Comments)    Gi upset   Flexeril [Cyclobenzaprine] Anxiety and Other (See Comments)    Pt states medication gives her nightmares and insomnia.     Current Outpatient Medications  Medication Sig Dispense Refill   aspirin 81 MG tablet Take 81 mg by mouth every morning.      carvedilol (COREG) 25 MG tablet Take 1 tablet (25 mg total) by mouth 2 (two) times daily with a meal. 180 tablet 3   cholecalciferol (VITAMIN D) 1000 UNITS tablet Take 1,000 Units by mouth every morning.      cloNIDine (CATAPRES) 0.2 MG tablet Take 1 tablet (0.2 mg total) by mouth 3 (three) times daily. 90 tablet 2   gabapentin (NEURONTIN) 100 MG capsule Take 1 capsule (100 mg total) by mouth 3 (three) times daily. 90 capsule 2   glimepiride (AMARYL) 1 MG tablet Take 1 tablet (1 mg total) by mouth every morning. 30 tablet 3   hydrALAZINE (APRESOLINE) 100 MG tablet Take 100 mg by  mouth 3 (three) times daily.     hydrochlorothiazide (HYDRODIURIL) 12.5 MG tablet Take 12.5 mg by mouth daily.     levothyroxine (SYNTHROID) 25 MCG tablet Take 1 tablet (25 mcg total) by mouth daily. 30 tablet 2   lidocaine (LMX) 4 % cream Apply topically 4 (four) times daily as needed (left upper extremity pain). 30 g 0   lovastatin (MEVACOR) 20 MG tablet Take 20 mg by mouth at bedtime.     Multiple Vitamin (MULTIVITAMIN) tablet Take 1 tablet by mouth every morning.      nicotine (NICODERM CQ - DOSED IN MG/24 HOURS) 14 mg/24hr patch Place 1 patch (14 mg total) onto the skin daily. 28 patch 11   nicotine polacrilex (NICORETTE MINI) 2 MG lozenge Take 1 lozenge (2 mg total) by mouth as needed for smoking cessation. 100 tablet 3   Oxycodone HCl 10 MG TABS Take 10 mg by mouth 4 (four) times daily as needed.     OXYGEN Inhale 1 L into the lungs at bedtime.     pantoprazole (PROTONIX) 40 MG tablet Take 1 tablet (40 mg total) by mouth daily. 30 tablet 1   polyethylene glycol (MIRALAX / GLYCOLAX) packet Take 17 g by mouth daily.     rosuvastatin (CRESTOR) 20 MG tablet Take 1 tablet (20 mg  total) by mouth daily. 30 tablet 2   sertraline (ZOLOFT) 100 MG tablet Take 100 mg by mouth at bedtime.     No current facility-administered medications for this visit.     Past Medical History:  Diagnosis Date   Anemia    Anxiety    Arthritis    Atrial fib/flutter, transient    Cataract    COPD (chronic obstructive pulmonary disease) (HCC)    Depression    Diabetes mellitus    Diverticulosis    DJD (degenerative joint disease)    GERD (gastroesophageal reflux disease)    Gout    History of pneumonia    Hypertension    Hypothyroidism    Internal hemorrhoids     ROS:   All systems reviewed and negative except as noted in the HPI.   Past Surgical History:  Procedure Laterality Date   ABDOMINAL AORTOGRAM W/LOWER EXTREMITY N/A 07/24/2020   Procedure: ABDOMINAL AORTOGRAM W/LOWER EXTREMITY;  Surgeon:  Serafina Mitchell, MD;  Location: [facility];  Service: Cardiovascular;  Laterality: N/A;   CATARACT EXTRACTION W/ INTRAOCULAR LENS IMPLANT Left    ENDARTERECTOMY Left 09/09/2019   Procedure: LEFT ILIAC ENDARTERECTOMY;  Surgeon: Serafina Mitchell, MD;  Location: [facility];  Service: Vascular;  Laterality: Left;   HERNIA REPAIR     INSERTION OF ILIAC STENT Left 09/09/2019   Procedure: INSERTION OF LEFT ILIAC STENT;  Surgeon: Serafina Mitchell, MD;  Location: [facility];  Service: Vascular;  Laterality: Left;   PACEMAKER INSERTION     PERIPHERAL VASCULAR INTERVENTION Left 07/24/2020   Procedure: PERIPHERAL VASCULAR INTERVENTION;  Surgeon: Serafina Mitchell, MD;  Location: [facility];  Service: Cardiovascular;  Laterality: Left;   THORACIC AORTIC ENDOVASCULAR STENT GRAFT N/A 09/09/2019   Procedure: THORACIC AORTIC ENDOVASCULAR STENT GRAFT WITH LEFT ILIAC CONDUIT;  Surgeon: Serafina Mitchell, MD;  Location: [facility];  Service: Vascular;  Laterality: N/A;   TUBAL LIGATION     ULTRASOUND GUIDANCE FOR VASCULAR ACCESS Left 09/09/2019   Procedure: ULTRASOUND GUIDANCE FOR VASCULAR ACCESS;  Surgeon: Serafina Mitchell, MD;  Location: [facility];  Service: Vascular;  Laterality: Left;   VAGINAL HYSTERECTOMY       Family History  Problem Relation Age of Onset   Prostate cancer Father    Hypertension Mother    Heart attack Mother    Heart disease Mother    Kidney disease Mother 28   Stomach cancer Son    Diabetes Maternal Aunt    CAD Maternal Aunt    Stroke Maternal Aunt    Heart disease Maternal Uncle    CAD Maternal Uncle    Stroke Maternal Uncle    Colon cancer Neg Hx    Esophageal cancer Neg Hx    Rectal cancer Neg Hx      Social History   Socioeconomic History   Marital status: Widowed    Spouse name: Not on file   Number of children: 2   Years of education: Not on file   Highest education level: Not on file  Occupational History   Occupation: Disabled    Employer: UNEMPLOYED  Tobacco Use    Smoking status: Some Days    Packs/day: 1.00    Years: 50.00    Pack years: 50.00    Types: Cigarettes   Smokeless tobacco: Never   Tobacco comments:    10 cigarettes  Vaping Use   Vaping Use: Never used  Substance and Sexual Activity  Alcohol use: No   Drug use: No   Sexual activity: Yes    Birth control/protection: Surgical  Other Topics Concern   Not on file  Social History Narrative   Not on file   Social Determinants of Health   Financial Resource Strain: Not on file  Food Insecurity: Not on file  Transportation Needs: Not on file  Physical Activity: Not on file  Stress: Not on file  Social Connections: Not on file  Intimate Partner Violence: Not on file     BP (!) 160/98    Pulse 83    Ht 5\' 5"  (1.651 m)    Wt 176 lb (79.8 kg)    SpO2 (!) 89%    BMI 29.29 kg/m   Physical Exam:  Well appearing NAD HEENT: Unremarkable Neck:  No JVD, no thyromegally Lymphatics:  No adenopathy Back:  No CVA tenderness Lungs:  Clear with no wheezes HEART:  Regular rate rhythm, no murmurs, no rubs, no clicks Abd:  soft, positive bowel sounds, no organomegally, no rebound, no guarding Ext:  2 plus pulses, no edema, no cyanosis, no clubbing Skin:  No rashes no nodules Neuro:  CN II through XII intact, motor grossly intact  DEVICE  Normal device function.  See PaceArt for details.   Assess/Plan:  sinus node dysfunction - she is asymptomatic s/p PPM insertion. HTN - she notes that at home her bp is much better controlled. I asked her to avoid salty foods. She has had some  sciatic pain as well. PAF - she has had less than 5 minutes of atrial fib at time. I suspect that she will have more and end up needing an [place].  PPM - her [employer]tr[employer] DDD PM is working normally. We will recheck in several months.  Kristy Overlie Kreig Parson,MD

## 2021-02-10 ENCOUNTER — Other Ambulatory Visit: Payer: Self-pay

## 2021-02-10 DIAGNOSIS — I712 Thoracic aortic aneurysm, without rupture, unspecified: Secondary | ICD-10-CM

## 2021-02-13 ENCOUNTER — Other Ambulatory Visit: Payer: Self-pay | Admitting: Internal Medicine

## 2021-02-13 DIAGNOSIS — Z1231 Encounter for screening mammogram for malignant neoplasm of breast: Secondary | ICD-10-CM

## 2021-03-05 ENCOUNTER — Ambulatory Visit
Admission: RE | Admit: 2021-03-05 | Discharge: 2021-03-05 | Disposition: A | Discharge status: Home / Self Care | Payer: Medicare Other | Source: Ambulatory Visit | Attending: Internal Medicine | Admitting: Internal Medicine

## 2021-03-05 DIAGNOSIS — Z1231 Encounter for screening mammogram for malignant neoplasm of breast: Secondary | ICD-10-CM

## 2021-03-06 ENCOUNTER — Other Ambulatory Visit: Payer: Self-pay | Admitting: Surgery

## 2021-03-06 ENCOUNTER — Ambulatory Visit
Admission: RE | Admit: 2021-03-06 | Discharge: 2021-03-06 | Disposition: A | Discharge status: Home / Self Care | Payer: Medicare Other | Source: Ambulatory Visit | Attending: Surgery | Admitting: Surgery

## 2021-03-06 ENCOUNTER — Ambulatory Visit: Admission: RE | Admit: 2021-03-06 | Payer: Medicare Other | Source: Ambulatory Visit

## 2021-03-06 DIAGNOSIS — I712 Thoracic aortic aneurysm, without rupture, unspecified: Secondary | ICD-10-CM

## 2021-03-11 ENCOUNTER — Encounter: Payer: Self-pay | Admitting: Surgery

## 2021-03-11 ENCOUNTER — Other Ambulatory Visit: Payer: Self-pay

## 2021-03-11 ENCOUNTER — Ambulatory Visit (INDEPENDENT_AMBULATORY_CARE_PROVIDER_SITE_OTHER): Payer: Medicare Other | Admitting: Surgery

## 2021-03-11 VITALS — BP 169/95 | HR 67 | Temp 98.0°F | Resp 20 | Ht 65.0 in | Wt 171.0 lb

## 2021-03-11 DIAGNOSIS — I712 Thoracic aortic aneurysm, without rupture, unspecified: Secondary | ICD-10-CM | POA: Diagnosis not present

## 2021-03-11 NOTE — Progress Notes (Signed)
[facility]  Patient name: [name] MRN: [id number] DOB: 1950-12-18 Sex: female   REASON FOR VISIT:    Follow up  [place]:   [name] is a 71 y.o. female who presented to the hospital in August 2021 with chest and back pain.  She was very hypertensive.  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  She was initially managed nonoperatively, however we could not get her blood pressure stabilized.  Ultimately on 09/09/2019 she underwent endovascular stent graft placement without left subclavian artery coverage.  This required a conduit off of the left external iliac artery.  She did have plaque at the level of the conduit which was evaluated with angiography from the groin after the procedure, and a stenosis was visualized so this was stented.  On postoperative day 2 she experienced lower extremity paralysis.  A spinal drain was placed and her weakness ultimately resolved.  She did develop a acute episode of renal insufficiency which resolved on its own.   She has had persistent pain in her left groin since the procedure.  A CT scan showed a iliac pseudoaneurysm.  It was initially felt that this was her conduit, however since it had increased in size, I elected to repair it.  On 07/24/2020 she had stenting of her left external iliac artery with a Viabahn she is back today for follow-up.  Unfortunately, her CT scan was done without contrast because of renal issues.  She is complaining of some left groin pain, remote from her retroperitoneal incision   The patient has a diagnosis of A. fib but is only taking aspirin.  She is a current smoker and has COPD.  She is medically managed for hypertension.  She is a diabetic.  She is medically managed for hypertension.  She takes a statin for hypercholesterolemia.      PAST MEDICAL HISTORY:   Past Medical History:  Diagnosis  Date   Anemia    Anxiety    Arthritis    Atrial fib/flutter, transient    Cataract    COPD (chronic obstructive pulmonary disease) (HCC)    Depression    Diabetes mellitus    Diverticulosis    DJD (degenerative joint disease)    GERD (gastroesophageal reflux disease)    Gout    History of pneumonia    Hypertension    Hypothyroidism    Internal hemorrhoids      FAMILY HISTORY:   Family History  Problem Relation Age of Onset   Prostate cancer Father    Hypertension Mother    Heart attack Mother    Heart disease Mother    Kidney disease Mother 45   Stomach cancer Son    Diabetes Maternal Aunt    CAD Maternal Aunt    Stroke Maternal Aunt    Heart disease Maternal Uncle    CAD Maternal Uncle    Stroke Maternal Uncle    Colon cancer Neg Hx    Esophageal cancer Neg Hx    Rectal cancer Neg Hx     SOCIAL HISTORY:   Social History   Tobacco Use   Smoking status: Some Days    Packs/day: 1.00    Years: 50.00    Pack years: 50.00    Types: Cigarettes   Smokeless tobacco: Never   Tobacco comments:    10 cigarettes  Substance Use Topics   Alcohol use: No     ALLERGIES:  Allergies  Allergen Reactions   Amlodipine Swelling    Started very close to episode of angioedema to ACE, unknown if this added effects or just residual   Lisinopril     angioedema   Losartan Swelling    Lip swelling    Codeine Phosphate Other (See Comments)    REACTION: UNKNOWN   Morphine And Related Other (See Comments)    Cannot take:Heart problems   Naproxen Nausea And Vomiting   Nsaids Nausea Only   Penicillins Other (See Comments)    UNKNOWN REACTION   Propoxyphene N-Acetaminophen Other (See Comments)    Makes me gag   Sulfamethoxazole-Trimethoprim Other (See Comments)    Gi upset   Flexeril [Cyclobenzaprine] Anxiety and Other (See Comments)    Pt states medication gives her nightmares and insomnia.     CURRENT MEDICATIONS:   Current Outpatient Medications  Medication  Sig Dispense Refill   aspirin 81 MG tablet Take 81 mg by mouth every morning.      carvedilol (COREG) 25 MG tablet Take 1 tablet (25 mg total) by mouth 2 (two) times daily with a meal. 180 tablet 3   cholecalciferol (VITAMIN D) 1000 UNITS tablet Take 1,000 Units by mouth every morning.      cloNIDine (CATAPRES) 0.2 MG tablet Take 1 tablet (0.2 mg total) by mouth 3 (three) times daily. 90 tablet 2   gabapentin (NEURONTIN) 100 MG capsule Take 1 capsule (100 mg total) by mouth 3 (three) times daily. 90 capsule 2   hydrALAZINE (APRESOLINE) 100 MG tablet Take 100 mg by mouth 3 (three) times daily.     hydrochlorothiazide (HYDRODIURIL) 12.5 MG tablet Take 12.5 mg by mouth daily.     levothyroxine (SYNTHROID) 25 MCG tablet Take 1 tablet (25 mcg total) by mouth daily. 30 tablet 2   lidocaine (LMX) 4 % cream Apply topically 4 (four) times daily as needed (left upper extremity pain). 30 g 0   lovastatin (MEVACOR) 20 MG tablet Take 20 mg by mouth at bedtime.     Multiple Vitamin (MULTIVITAMIN) tablet Take 1 tablet by mouth every morning.      nicotine (NICODERM CQ - DOSED IN MG/24 HOURS) 14 mg/24hr patch Place 1 patch (14 mg total) onto the skin daily. 28 patch 11   nicotine polacrilex (NICORETTE MINI) 2 MG lozenge Take 1 lozenge (2 mg total) by mouth as needed for smoking cessation. 100 tablet 3   Oxycodone HCl 10 MG TABS Take 10 mg by mouth 4 (four) times daily as needed.     OXYGEN Inhale 1 L into the lungs at bedtime.     pantoprazole (PROTONIX) 40 MG tablet Take 1 tablet (40 mg total) by mouth daily. 30 tablet 1   polyethylene glycol (MIRALAX / GLYCOLAX) packet Take 17 g by mouth daily.     rosuvastatin (CRESTOR) 20 MG tablet Take 1 tablet (20 mg total) by mouth daily. 30 tablet 2   sertraline (ZOLOFT) 100 MG tablet Take 100 mg by mouth at bedtime.     glimepiride (AMARYL) 1 MG tablet Take 1 tablet (1 mg total) by mouth every morning. 30 tablet 3   No current facility-administered medications for  this visit.    REVIEW OF SYSTEMS:   [X]  denotes positive finding, [ ]  denotes negative finding Cardiac  Comments:  Chest pain or chest pressure:    Shortness of breath upon exertion:    Short of breath when lying flat:    Irregular heart rhythm:  Vascular    Pain in calf, thigh, or hip brought on by ambulation:    Pain in feet at night that wakes you up from your sleep:     Blood clot in your veins:    Leg swelling:         Pulmonary    Oxygen at home:    Productive cough:     Wheezing:         Neurologic    Sudden weakness in arms or legs:     Sudden numbness in arms or legs:     Sudden onset of difficulty speaking or slurred speech:    Temporary loss of vision in one eye:     Problems with dizziness:         Gastrointestinal    Blood in stool:     Vomited blood:         Genitourinary    Burning when urinating:     Blood in urine:        Psychiatric    Major depression:         Hematologic    Bleeding problems:    Problems with blood clotting too easily:        Skin    Rashes or ulcers:        Constitutional    Fever or chills:      PHYSICAL EXAM:   Vitals:   03/11/21 1409  BP: (!) 169/95  Pulse: 67  Resp: 20  Temp: 98 F (36.7 C)  SpO2: 93%  Weight: 171 lb (77.6 kg)  Height: 5\' 5"  (1.651 m)    GENERAL: The patient is a well-nourished female, in no acute distress. The vital signs are documented above. CARDIAC: There is a regular rate and rhythm.  VASCULAR: Palpable femoral pulses PULMONARY: Non-labored respirations ABDOMEN: Soft but tender below her retroperitoneal incision  MUSCULOSKELETAL: There are no major deformities or cyanosis. NEUROLOGIC: No focal weakness or paresthesias are detected. SKIN: There are no ulcers or rashes noted. PSYCHIATRIC: The patient has a normal affect.  STUDIES:   I have reviewed her CT scan with the following findings: Chest CT impression:   1. 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 Recommend annual imaging followup by CTA or MRA. This recommendation follows 2010 ACCF/AHA/AATS/ACR/ASA/SCA/SCAI/SIR/STS/SVM Guidelines for the Diagnosis and Management of Patients with Thoracic Aortic Disease. Circulation. 2010; 121: Z601-U932. Aortic aneurysm NOS (ICD10-I71.9) 2. Post stent graft repair of the descending thoracic aorta without evidence of complication on this noncontrast examination. 3. Cardiomegaly. Coronary artery calcifications.   Abdomen and pelvic CT impression:   1. Moderate to large amount of slightly irregular calcified atherosclerotic plaque within a normal caliber abdominal aorta. Aortic Atherosclerosis (ICD10-I70.0). 2. 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  MEDICAL ISSUES:   Aortic dissection, unfortunately this was not adequately visualized on imaging because she could not get contrast.  However no aneurysmal changes were identified.  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  She did have renal issues which prohibited her from getting contrast for her CT scan.  And trying to set up an appointment for her to see Dr. Pattricia Boss for further evaluation of her kidney function.  At some point time if her renal function returns to normal which is  around 1.3, I would like to get a contrasted image to better evaluate everything.  In the meantime, because she is still having ongoing left lower quadrant pain, I will get a ultrasound as well as check ABIs.  She will return to see me in 6 weeks  A-fib: Per Dr. Tanna Furry note he they are considering adding anticoagulation to her aspirin.  From my perspective I would be okay with that    Leia Alf, MD, FACS [facility] [phone] Pager [phone]

## 2021-03-15 ENCOUNTER — Other Ambulatory Visit: Payer: Self-pay

## 2021-03-15 DIAGNOSIS — I712 Thoracic aortic aneurysm, without rupture, unspecified: Secondary | ICD-10-CM

## 2021-03-21 ENCOUNTER — Other Ambulatory Visit: Payer: Self-pay | Admitting: Internal Medicine

## 2021-03-21 DIAGNOSIS — N63 Unspecified lump in unspecified breast: Secondary | ICD-10-CM

## 2021-04-08 ENCOUNTER — Ambulatory Visit: Admission: RE | Admit: 2021-04-08 | Payer: Medicare Other | Source: Ambulatory Visit

## 2021-04-08 ENCOUNTER — Ambulatory Visit
Admission: RE | Admit: 2021-04-08 | Discharge: 2021-04-08 | Disposition: A | Discharge status: Home / Self Care | Payer: Medicare Other | Source: Ambulatory Visit | Attending: Internal Medicine | Admitting: Internal Medicine

## 2021-04-08 DIAGNOSIS — N63 Unspecified lump in unspecified breast: Secondary | ICD-10-CM

## 2021-04-22 ENCOUNTER — Encounter (HOSPITAL_COMMUNITY): Payer: Medicare Other

## 2021-04-22 ENCOUNTER — Ambulatory Visit: Payer: Medicare Other | Admitting: Surgery

## 2021-05-20 HISTORY — PX: CAROTID STENT: SHX1301

## 2021-06-03 ENCOUNTER — Ambulatory Visit (HOSPITAL_COMMUNITY)
Admission: RE | Admit: 2021-06-03 | Discharge: 2021-06-03 | Disposition: A | Discharge status: Home / Self Care | Payer: Medicare Other | Source: Ambulatory Visit | Attending: Surgery | Admitting: Surgery

## 2021-06-03 ENCOUNTER — Ambulatory Visit (INDEPENDENT_AMBULATORY_CARE_PROVIDER_SITE_OTHER)
Admission: RE | Admit: 2021-06-03 | Discharge: 2021-06-03 | Disposition: A | Discharge status: Home / Self Care | Payer: Medicare Other | Source: Ambulatory Visit | Attending: Surgery | Admitting: Surgery

## 2021-06-03 ENCOUNTER — Encounter: Payer: Self-pay | Admitting: Surgery

## 2021-06-03 ENCOUNTER — Ambulatory Visit (INDEPENDENT_AMBULATORY_CARE_PROVIDER_SITE_OTHER): Payer: Medicare Other | Admitting: Surgery

## 2021-06-03 VITALS — BP 170/87 | HR 67 | Temp 98.0°F | Resp 20 | Ht 65.0 in | Wt 174.1 lb

## 2021-06-03 DIAGNOSIS — I712 Thoracic aortic aneurysm, without rupture, unspecified: Secondary | ICD-10-CM | POA: Insufficient documentation

## 2021-06-03 NOTE — Progress Notes (Signed)
? ?[facility] ? ?Patient name: [name] MRN: [id number] DOB: 03-20-1950 Sex: female ? ? ?REASON FOR VISIT:  ? ? ?Follow up ? ?HISOTRY OF PRESENT ILLNESS:  ? ?[name] is a 71 y.o. female who presented to the hospital in August 2021 with chest and back pain.  She was very hypertensive.  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  She was initially managed nonoperatively, however we could not get her blood pressure stabilized.  Ultimately on 09/09/2019 she underwent endovascular stent graft placement without left subclavian artery coverage.  This required a conduit off of the left external iliac artery.  She did have plaque at the level of the conduit which was evaluated with angiography from the groin after the procedure, and a stenosis was visualized so this was stented.  On postoperative day 2 she experienced lower extremity paralysis.  A spinal drain was placed and her weakness ultimately resolved.  She did develop a acute episode of renal insufficiency which resolved on its own. ?  ?She has had persistent pain in her left groin since the procedure.  A CT scan showed a iliac pseudoaneurysm.  It was initially felt that this was her conduit, however since it had increased in size, I elected to repair it.  On 07/24/2020 she had stenting of her left external iliac artery with a Viabahn she is back today for follow-up.  Unfortunately, her CT scan was done without c. ?  ?The patient has a diagnosis of A. fib but is only taking aspirin.  She is a current smoker and has COPD.  She is medically managed for hypertension.  She is a diabetic.  She is medically managed for hypertension.  She takes a statin for hypercholesterolemia ? ?At her last visit, she could not receive contrast because of her creatinine and so I was not able to adequately visualize her aorta.  Her left iliac pseudoaneurysm appeared to be  completely resolved.  She was still complaining of left lower quadrant pain, and so I ordered an ultrasound and ABIs.  She is back today ? ?Results. ?PAST MEDICAL HISTORY:  ? ?Past Medical History:  ?Diagnosis Date  ? Anemia   ? Anxiety   ? Arthritis   ? Atrial fib/flutter, transient   ? Cataract   ? COPD (chronic obstructive pulmonary disease) ([place])   ? Depression   ? Diabetes mellitus   ? Diverticulosis   ? DJD (degenerative joint disease)   ? GERD (gastroesophageal reflux disease)   ? Gout   ? History of pneumonia   ? Hypertension   ? Hypothyroidism   ? Internal hemorrhoids   ? ? ? ?FAMILY HISTORY:  ? ?Family History  ?Problem Relation Age of Onset  ? Prostate cancer Father   ? Hypertension Mother   ? Heart attack Mother   ? Heart disease Mother   ? Kidney disease Mother 43  ? Stomach cancer Son   ? Diabetes Maternal Aunt   ? CAD Maternal Aunt   ? Stroke Maternal Aunt   ? Heart disease Maternal Uncle   ? CAD Maternal Uncle   ? Stroke Maternal Uncle   ? Colon cancer Neg Hx   ? Esophageal cancer Neg Hx   ? Rectal cancer Neg Hx   ? ? ?SOCIAL HISTORY:  ? ?Social History  ? ?Tobacco Use  ? Smoking status: Former  ?  Packs/day: 1.00  ?  Years: 50.00  ?  Pack years: 50.00  ?  Types: Cigarettes  ?  Quit date: 11/20/2020  ?  Years since quitting: 0.5  ?  Passive exposure: Never  ? Smokeless tobacco: Never  ? Tobacco comments:  ?  10 cigarettes  ?Substance Use Topics  ? Alcohol use: No  ? ? ? ?ALLERGIES:  ? ?Allergies  ?Allergen Reactions  ? Amlodipine Swelling  ?  Started very close to episode of angioedema to ACE, unknown if this added effects or just residual  ? Lisinopril   ?  angioedema  ? Losartan Swelling  ?  Lip swelling ?  ? Codeine Phosphate Other (See Comments)  ?  REACTION: UNKNOWN  ? Morphine And Related Other (See Comments)  ?  Cannot take:Heart problems  ? Naproxen Nausea And Vomiting  ? Nsaids Nausea Only  ? Penicillins Other (See Comments)  ?  UNKNOWN REACTION  ? Propoxyphene N-Acetaminophen Other (See  Comments)  ?  Makes me gag  ? Sulfamethoxazole-Trimethoprim Other (See Comments)  ?  Gi upset  ? Flexeril [Cyclobenzaprine] Anxiety and Other (See Comments)  ?  Pt states medication gives her nightmares and insomnia.  ? ? ? ?CURRENT MEDICATIONS:  ? ?Current Outpatient Medications  ?Medication Sig Dispense Refill  ? aspirin 81 MG tablet Take 81 mg by mouth every morning.     ? carvedilol (COREG) 25 MG tablet Take 1 tablet (25 mg total) by mouth 2 (two) times daily with a meal. 180 tablet 3  ? cholecalciferol (VITAMIN D) 1000 UNITS tablet Take 1,000 Units by mouth every morning.     ? cloNIDine (CATAPRES) 0.2 MG tablet Take 1 tablet (0.2 mg total) by mouth 3 (three) times daily. 90 tablet 2  ? gabapentin (NEURONTIN) 100 MG capsule Take 1 capsule (100 mg total) by mouth 3 (three) times daily. 90 capsule 2  ? hydrALAZINE (APRESOLINE) 100 MG tablet Take 100 mg by mouth 3 (three) times daily.    ? hydrochlorothiazide (HYDRODIURIL) 12.5 MG tablet Take 12.5 mg by mouth daily.    ? levothyroxine (SYNTHROID) 25 MCG tablet Take 1 tablet (25 mcg total) by mouth daily. 30 tablet 2  ? lidocaine (LMX) 4 % cream Apply topically 4 (four) times daily as needed (left upper extremity pain). 30 g 0  ? lovastatin (MEVACOR) 20 MG tablet Take 20 mg by mouth at bedtime.    ? Multiple Vitamin (MULTIVITAMIN) tablet Take 1 tablet by mouth every morning.     ? nicotine (NICODERM CQ - DOSED IN MG/24 HOURS) 14 mg/24hr patch Place 1 patch (14 mg total) onto the skin daily. 28 patch 11  ? nicotine polacrilex (NICORETTE MINI) 2 MG lozenge Take 1 lozenge (2 mg total) by mouth as needed for smoking cessation. 100 tablet 3  ? Oxycodone HCl 10 MG TABS Take 10 mg by mouth 4 (four) times daily as needed.    ? OXYGEN Inhale 1 L into the lungs at bedtime.    ? pantoprazole (PROTONIX) 40 MG tablet Take 1 tablet (40 mg total) by mouth daily. 30 tablet 1  ? polyethylene glycol (MIRALAX / GLYCOLAX) packet Take 17 g by mouth daily.    ? rosuvastatin (CRESTOR) 20  MG tablet Take 1 tablet (20 mg total) by mouth daily. 30 tablet 2  ? sertraline (ZOLOFT) 100 MG tablet Take 100 mg by mouth at bedtime.    ? clopidogrel (PLAVIX) 75 MG tablet Take 75 mg by mouth daily.    ? glimepiride (AMARYL) 1 MG tablet Take 1  tablet (1 mg total) by mouth every morning. 30 tablet 3  ? ?No current facility-administered medications for this visit.  ? ? ?REVIEW OF SYSTEMS:  ? ?'[X]'$  denotes positive finding, '[ ]'$  denotes negative finding ?Cardiac  Comments:  ?Chest pain or chest pressure:    ?Shortness of breath upon exertion:    ?Short of breath when lying flat:    ?Irregular heart rhythm:    ?    ?Vascular    ?Pain in calf, thigh, or hip brought on by ambulation:    ?Pain in feet at night that wakes you up from your sleep:     ?Blood clot in your veins:    ?Leg swelling:     ?    ?Pulmonary    ?Oxygen at home:    ?Productive cough:     ?Wheezing:     ?    ?Neurologic    ?Sudden weakness in arms or legs:     ?Sudden numbness in arms or legs:     ?Sudden onset of difficulty speaking or slurred speech:    ?Temporary loss of vision in one eye:     ?Problems with dizziness:     ?    ?Gastrointestinal    ?Blood in stool:     ?Vomited blood:     ?    ?Genitourinary    ?Burning when urinating:     ?Blood in urine:    ?    ?Psychiatric    ?Major depression:     ?    ?Hematologic    ?Bleeding problems:    ?Problems with blood clotting too easily:    ?    ?Skin    ?Rashes or ulcers:    ?    ?Constitutional    ?Fever or chills:    ? ? ?PHYSICAL EXAM:  ? ?Vitals:  ? 06/03/21 1153  ?BP: (!) 170/87  ?Pulse: 67  ?Resp: 20  ?Temp: 98 ?F (36.7 ?C)  ?TempSrc: Temporal  ?SpO2: 92%  ?Weight: 174 lb 1.6 oz (79 kg)  ?Height: '5\' 5"'$  (1.651 m)  ? ? ?GENERAL: The patient is a well-nourished female, in no acute distress. The vital signs are documented above. ?CARDIAC: There is a regular rate and rhythm.  ?PULMONARY: Non-labored respirations ?MUSCULOSKELETAL: There are no major deformities or cyanosis. ?NEUROLOGIC: No focal  weakness or paresthesias are detected. ?SKIN: There are no ulcers or rashes noted. ?PSYCHIATRIC: The patient has a normal affect. ? ?STUDIES:  ? ?I reviewed the following studies: ?+-------+-----------+----------

## 2021-06-10 ENCOUNTER — Ambulatory Visit (INDEPENDENT_AMBULATORY_CARE_PROVIDER_SITE_OTHER): Payer: Medicare Other

## 2021-06-10 DIAGNOSIS — I480 Paroxysmal atrial fibrillation: Secondary | ICD-10-CM | POA: Diagnosis not present

## 2021-06-11 LAB — CUP PACEART REMOTE DEVICE CHECK
Battery Impedance: 3307 Ohm
Battery Remaining Longevity: 21 mo
Battery Voltage: 2.72 V
Brady Statistic AP VP Percent: 4 %
Brady Statistic AP VS Percent: 51 %
Brady Statistic AS VP Percent: 5 %
Brady Statistic AS VS Percent: 40 %
Date Time Interrogation Session: 20230517145736
Implantable Lead Implant Date: 19970929
Implantable Lead Implant Date: 19970929
Implantable Lead Location: 753859
Implantable Lead Location: 753860
Implantable Lead Model: 4068
Implantable Lead Model: 5092
Implantable Pulse Generator Implant Date: 20090505
Lead Channel Impedance Value: 604 Ohm
Lead Channel Impedance Value: 675 Ohm
Lead Channel Pacing Threshold Amplitude: 1 V
Lead Channel Pacing Threshold Amplitude: 1.25 V
Lead Channel Pacing Threshold Pulse Width: 0.4 ms
Lead Channel Pacing Threshold Pulse Width: 0.4 ms
Lead Channel Setting Pacing Amplitude: 2.5 V
Lead Channel Setting Pacing Amplitude: 2.5 V
Lead Channel Setting Pacing Pulse Width: 0.4 ms
Lead Channel Setting Sensing Sensitivity: 5.6 mV

## 2021-06-26 NOTE — Progress Notes (Signed)
Remote pacemaker transmission.   

## 2021-06-28 ENCOUNTER — Encounter: Payer: Self-pay | Admitting: Gastroenterology

## 2021-07-17 ENCOUNTER — Encounter: Payer: Self-pay | Admitting: Pulmonary Disease

## 2021-07-17 ENCOUNTER — Ambulatory Visit (INDEPENDENT_AMBULATORY_CARE_PROVIDER_SITE_OTHER): Payer: Medicare Other | Admitting: Pulmonary Disease

## 2021-07-17 VITALS — BP 160/84 | HR 84 | Temp 98.9°F | Ht 65.0 in | Wt 177.8 lb

## 2021-07-17 DIAGNOSIS — R0902 Hypoxemia: Secondary | ICD-10-CM | POA: Diagnosis not present

## 2021-07-17 MED ORDER — STIOLTO RESPIMAT 2.5-2.5 MCG/ACT IN AERS: 2.0000 | INHALATION_SPRAY | Freq: Every day | RESPIRATORY_TRACT | 0 refills | Status: DC

## 2021-07-17 NOTE — Patient Instructions (Signed)
Nice to see you again  Try Stiolto 2 puffs once a day every day.  If this is helpful send me a message in a couple weeks and I will send a prescription in.  I provided samples today.  Return to clinic in 3 months or sooner as needed with Dr. Silas Flood

## 2021-07-18 NOTE — Progress Notes (Signed)
$'@Patient'B$  ID: Stephannie Li, female    DOB: 04-11-1950, 71 y.o.   MRN: [id number]  Chief Complaint  Patient presents with   Follow-up    Pt states that during the day she has SOB with exertion. Pt states she is only using O2 at night on 1L.     Referring provider: Antonietta Jewel, MD  HPI:   71 y.o. woman whom we are seeing in follow up for evaluation of history of oxygen use and DOE. Most recent cardiology note reviewed.  Lost to follow-up.  Seen about 9 months ago.  No major changes.  Using 1 L at night for oxygen.  Dyspnea on exertion.  Largely unchanged.  Reviewed with her CT scan 2/23 that revealed emphysema.  Has not had PFTs but evidence of smoking-related disease.  Discussed role and rationale for bronchodilator therapy to help with this.  HPI at initial visit: Patient was mid to the hospital 06/2020 with severe allergic/near anaphylactic reaction.  Seem to be the antibiotic.  VBG showed hypercarbia PCO2 in the 60s.  Reportedly hypoxemic.  Very wheezy.  Treated with supportive care.  Received antihistamines, epinephrine injection, steroids.  Gradually improved.  Was discharged on 2 L oxygen after approximate 24 hours in the hospital.  Relatively fast discharge especially with new oxygen requirement.  Quickly stopped using at home.  Notes checks her oxygen saturation.  Lowest this gets as 93% during the day when she is walking around.  She continues 1 L at night.  She does this because she was told to.  No overnight oximetry.  She endorses ongoing smoking of cigarettes.  Has cut down.  Wishes to decrease further.  Cites son's illness in [place] stressor.  She denies any real breathing issues.  Prior to that acute illness, no breathing issues.  No respiratory issues.  No dyspnea on exertion.  He is not limited in any way from exertional capacity.  Occasional cough but not bothersome.  Never had PFTs per her report.  Review chest x-ray during admission 06-2020 on my interpretation  reveals clear lungs bilaterally.   PMH: Tobacco abuse in remission Surgical history: Hysterectomy, tubal ligation, hernia repair, endarterectomy Family history: Father with prostate cancer, mother with CAD, hypertension, CKD Social history: Current smoker, does not have a pack a day.  50-pack-year smoking history, smoked a pack a day for 50 years before cutting down recently, lives in [place] / Pulmonary Flowsheets:   ACT:      No data to display          MMRC:     No data to display          [place]:      No data to display          Tests:   FENO:  No results found for: "NITRICOXIDE"  PFT:     No data to display          WALK:      No data to display          Imaging: Personally reviewed and as per EMR discussion this note  Lab Results: Personally reviewed, elevated eosinophils notably CBC    Component Value Date/Time   WBC 9.7 06/26/2020 0538   RBC 4.38 06/26/2020 0538   HGB 11.9 (L) 07/24/2020 0809   HCT 35.0 (L) 07/24/2020 0809   PLT 274 06/26/2020 0538   MCV 94.1 06/26/2020 0538   MCH 29.9 06/26/2020 0538   MCHC 31.8 06/26/2020  0538   RDW 19.0 (H) 06/26/2020 0538   LYMPHSABS 1.2 06/25/2020 0919   MONOABS 0.9 06/25/2020 0919   EOSABS 0.4 06/25/2020 0919   BASOSABS 0.1 06/25/2020 0919    BMET    Component Value Date/Time   NA 142 07/24/2020 0809   K 3.2 (L) 07/24/2020 0809   CL 101 07/24/2020 0809   CO2 27 06/26/2020 0538   GLUCOSE 104 (H) 07/24/2020 0809   BUN 22 07/24/2020 0809   CREATININE 1.40 (H) 07/24/2020 0809   CALCIUM 8.7 (L) 06/26/2020 0538   GFRNONAA 42 (L) 06/26/2020 0538   GFRAA 48 (L) 09/19/2019 0140    BNP    Component Value Date/Time   BNP 147.0 (H) 01/29/2020 0040    ProBNP No results found for: "PROBNP"  Specialty Problems       Pulmonary Problems   OBSTRUCTIVE SLEEP APNEA    Qualifier: Diagnosis of  By: Jenny Reichmann MD, Hunt Oris       ASTHMATIC BRONCHITIS, ACUTE    Qualifier:  Diagnosis of  By: Jenny Reichmann MD, Hunt Oris       Hypoxia   COPD (chronic obstructive pulmonary disease) ([place])   Acute respiratory failure with hypoxia and hypercarbia ([place])   COPD with acute exacerbation ([facility])    Allergies  Allergen Reactions   Amlodipine Swelling    Started very close to episode of angioedema to ACE, unknown if this added effects or just residual   Lisinopril     angioedema   Losartan Swelling    Lip swelling    Codeine Phosphate Other (See Comments)    REACTION: UNKNOWN   Morphine And Related Other (See Comments)    Cannot take:Heart problems   Naproxen Nausea And Vomiting   Nsaids Nausea Only   Penicillins Other (See Comments)    UNKNOWN REACTION   Propoxyphene N-Acetaminophen Other (See Comments)    Makes me gag   Sulfamethoxazole-Trimethoprim Other (See Comments)    Gi upset   Flexeril [Cyclobenzaprine] Anxiety and Other (See Comments)    Pt states medication gives her nightmares and insomnia.    Immunization History  Administered Date(s) Administered   H1N1 02/16/2008   Influenza Whole 11/22/2008   PFIZER(Purple Top)SARS-COV-2 Vaccination 06/25/2019, 08/22/2019   Pfizer Covid-19 Vaccine Bivalent Booster 64yr & up 02/24/2020, 09/21/2020   Pneumococcal Polysaccharide-23 05/21/2006    Past Medical History:  Diagnosis Date   Anemia    Anxiety    Arthritis    Atrial fib/flutter, transient    Cataract    COPD (chronic obstructive pulmonary disease) ([facility])    Depression    Diabetes mellitus    Diverticulosis    DJD (degenerative joint disease)    GERD (gastroesophageal reflux disease)    Gout    History of pneumonia    Hypertension    Hypothyroidism    Internal hemorrhoids     Tobacco History: Social History   Tobacco Use  Smoking Status Every Day   Packs/day: 1.00   Years: 50.00   Total pack years: 50.00   Types: Cigarettes   Last attempt to quit: 11/20/2020   Years since quitting: 0.6   Passive exposure: Never  Smokeless Tobacco  Never  Tobacco Comments   7 cigarettes daily   Ready to quit: Not Answered Counseling given: Not Answered Tobacco comments: 7 cigarettes daily   Outpatient Encounter Medications as of 07/17/2021  Medication Sig   Tiotropium Bromide-Olodaterol (STIOLTO RESPIMAT) 2.5-2.5 MCG/ACT AERS Inhale 2 puffs into the lungs daily.   aspirin  81 MG tablet Take 81 mg by mouth every morning.    carvedilol (COREG) 25 MG tablet Take 1 tablet (25 mg total) by mouth 2 (two) times daily with a meal.   cholecalciferol (VITAMIN D) 1000 UNITS tablet Take 1,000 Units by mouth every morning.    cloNIDine (CATAPRES) 0.2 MG tablet Take 1 tablet (0.2 mg total) by mouth 3 (three) times daily.   clopidogrel (PLAVIX) 75 MG tablet Take 75 mg by mouth daily.   gabapentin (NEURONTIN) 100 MG capsule Take 1 capsule (100 mg total) by mouth 3 (three) times daily.   glimepiride (AMARYL) 1 MG tablet Take 1 tablet (1 mg total) by mouth every morning.   hydrALAZINE (APRESOLINE) 100 MG tablet Take 100 mg by mouth 3 (three) times daily.   hydrochlorothiazide (HYDRODIURIL) 12.5 MG tablet Take 12.5 mg by mouth daily.   levothyroxine (SYNTHROID) 25 MCG tablet Take 1 tablet (25 mcg total) by mouth daily.   lovastatin (MEVACOR) 20 MG tablet Take 20 mg by mouth at bedtime.   Multiple Vitamin (MULTIVITAMIN) tablet Take 1 tablet by mouth every morning.    nicotine (NICODERM CQ - DOSED IN MG/24 HOURS) 14 mg/24hr patch Place 1 patch (14 mg total) onto the skin daily.   nicotine polacrilex (NICORETTE MINI) 2 MG lozenge Take 1 lozenge (2 mg total) by mouth as needed for smoking cessation.   Oxycodone HCl 10 MG TABS Take 10 mg by mouth 4 (four) times daily as needed.   OXYGEN Inhale 1 L into the lungs at bedtime.   pantoprazole (PROTONIX) 40 MG tablet Take 1 tablet (40 mg total) by mouth daily.   polyethylene glycol (MIRALAX / GLYCOLAX) packet Take 17 g by mouth daily.   rosuvastatin (CRESTOR) 20 MG tablet Take 1 tablet (20 mg total) by mouth  daily.   sertraline (ZOLOFT) 100 MG tablet Take 100 mg by mouth at bedtime.   [DISCONTINUED] lidocaine (LMX) 4 % cream Apply topically 4 (four) times daily as needed (left upper extremity pain). (Patient not taking: Reported on 07/17/2021)   No facility-administered encounter medications on file as of 07/17/2021.     Review of Systems  Review of Systems  N/a Physical Exam  BP (!) 160/84 (BP Location: Left Arm, Patient Position: Sitting, Cuff Size: Normal)   Pulse 84   Temp 98.9 F (37.2 C) (Oral)   Ht '5\' 5"'$  (1.651 m)   Wt 177 lb 12.8 oz (80.6 kg)   SpO2 94%   BMI 29.59 kg/m   Wt Readings from Last 5 Encounters:  07/17/21 177 lb 12.8 oz (80.6 kg)  06/03/21 174 lb 1.6 oz (79 kg)  03/11/21 171 lb (77.6 kg)  02/05/21 176 lb (79.8 kg)  10/16/20 172 lb 6.4 oz (78.2 kg)    BMI Readings from Last 5 Encounters:  07/17/21 29.59 kg/m  06/03/21 28.97 kg/m  03/11/21 28.46 kg/m  02/05/21 29.29 kg/m  10/16/20 30.17 kg/m    Physical Exam General: Well-appearing, no acute distress Eyes: EOMI, icterus Neck: Supple, no JVP Pulmonary: Distant, clear, normal work of breathing Cardiovascular: Regular in rhythm, no murmur Abdomen: Nondistended, bowel sounds present MSK: No synovitis, no joint effusion Neuro: Normal gait, no weakness Psych: Normal mood, full affect   Assessment & Plan:   History of severe allergic reaction/anaphylaxis: Presented with hypercarbia and hypoxemia.  Suspect this was acute bronchospasm.  Possible underlying COPD.  Emphysema seen on CT scan.  DOE: Likely multifactorial related to deconditioning as well as smoking-related disease.  Emphysema on  CT scan.  No PFTs.  Trial Stiolto for bronchodilation to see if helps with symptoms.  Tobacco abuse: Smoking assessment and cessation counseling Patient currently smoking. I have advised the patient to quit/stop smoking as soon as possible due to high risk for multiple medical problems.  It will also be very  difficult for [place] to manage patient's  respiratory symptoms and status if we continue to expose her lungs to a known irritant.  We do not advise e-cigarettes as a form of stopping smoking. Patient is willing to quit smoking. I have advised the patient that we can assist and have options of nicotine replacement therapy, provided smoking cessation education today, provided smoking cessation counseling, and provided cessation resources. Advise gradual reduction in cigarettes every week or 2.  She expressed understanding.   Nocturnal hypoxemia: Confirmed on overnight oximetry.  Using 1 L nasal cannula to continue at night.  Return in about 3 months (around 10/17/2021).   Lanier Clam, MD 07/18/2021

## 2021-09-26 IMAGING — DX DG CHEST 1V PORT
1 series · 1 of 1 positions shown · non-contrast
Comparison: 08/28/2019

CLINICAL DATA: Shortness of breath. Known aortic intramural
hematoma.

EXAM:
PORTABLE CHEST 1 VIEW

[chest ap]
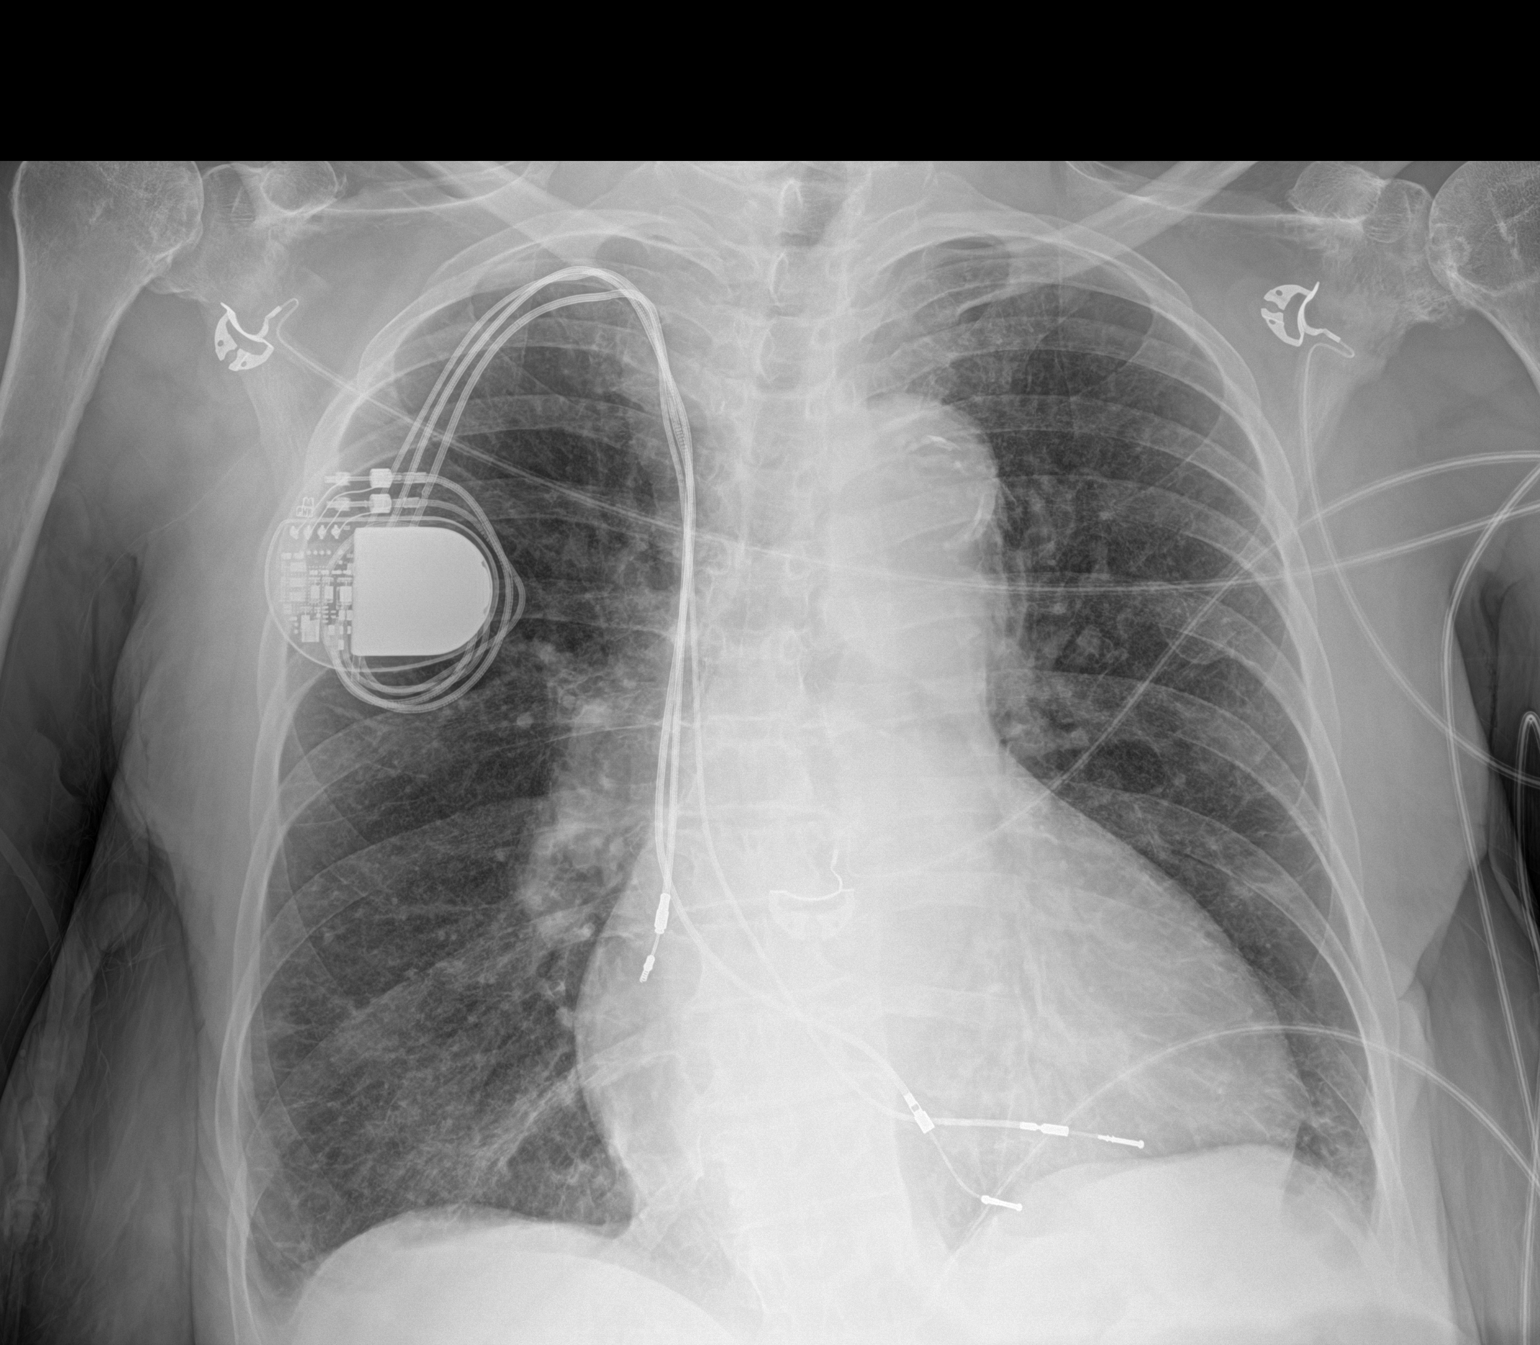

[1 of 1 positions shown; findings below may reference images not displayed]

FINDINGS: The heart is mildly enlarged but stable. Stable tortuosity, ectasia
and calcification of the thoracic aorta.

The pacer wires are stable.

Stable underlying emphysematous changes but no acute overlying
pulmonary process. No pleural effusion or worrisome pulmonary
lesions.
IMPRESSION: 1. No acute cardiopulmonary findings.
2. Stable cardiac enlargement.
3. Stable emphysematous changes.

## 2021-09-27 IMAGING — CT CT ANGIO CHEST-ABD-PELV FOR DISSECTION W/ AND WO/W CM
2 of 7 series · 12 of 46 positions shown, 14 images · IV contrast (APPLIED)
Comparison: CT dated 08/28/2019

CLINICAL DATA: Thoracic aortic dissection follow-up

EXAM:
CT ANGIOGRAPHY CHEST, ABDOMEN AND PELVIS
TECHNIQUE: Non-contrast CT of the chest was initially obtained.

[Series 7: arterial · axial · arterial · 0.70mm/px · z∈[-404,+144]mm · 9 of 310 slices shown, 11 images]
[im 18/310  soft-tissue]
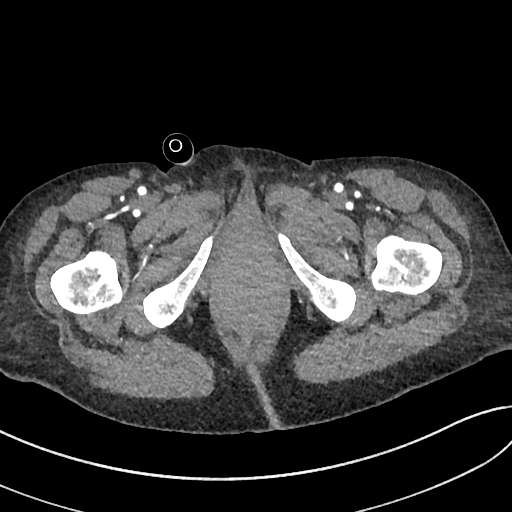
[im 18/310  bone]
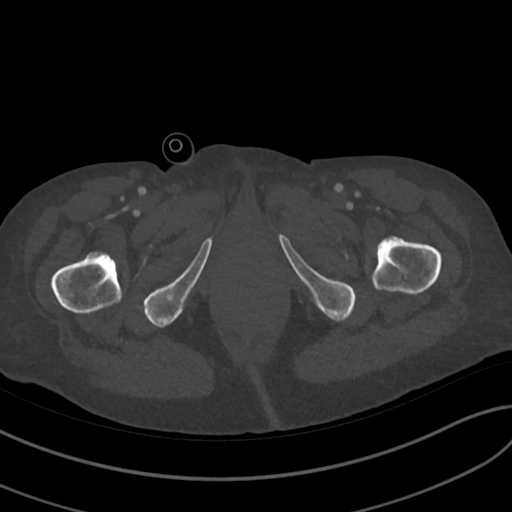
[im 52/310  soft-tissue]
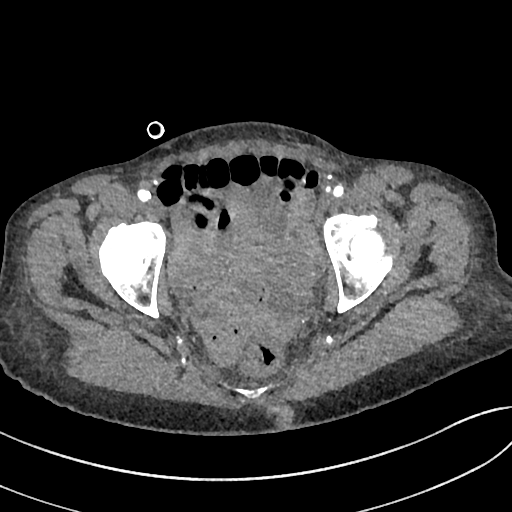
[im 86/310  soft-tissue]
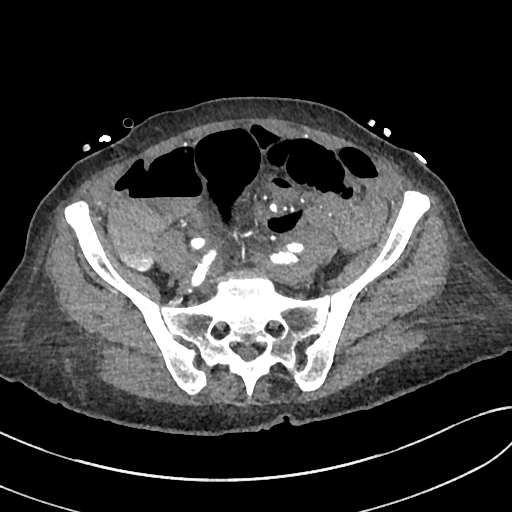
[im 121/310  soft-tissue]
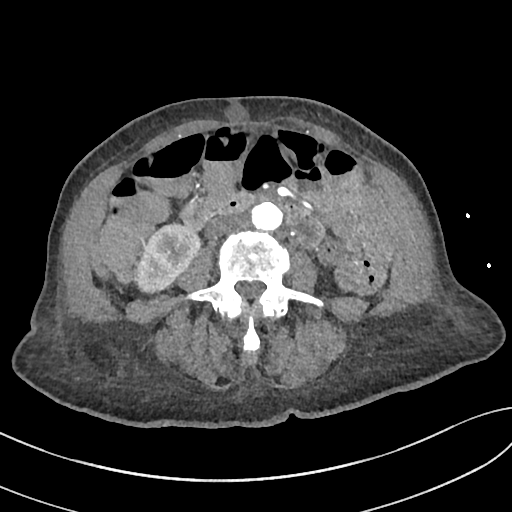
[im 155/310  soft-tissue]
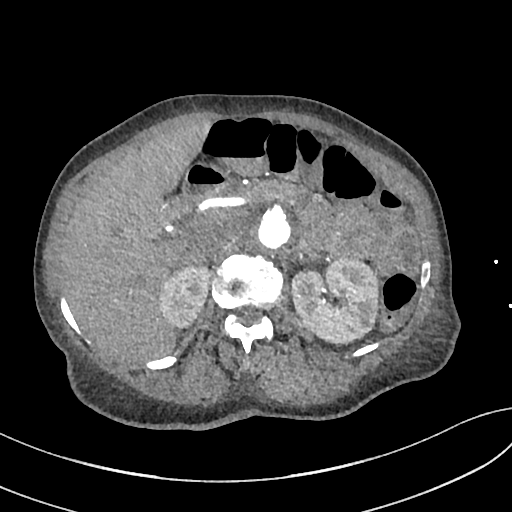
[im 189/310  soft-tissue]
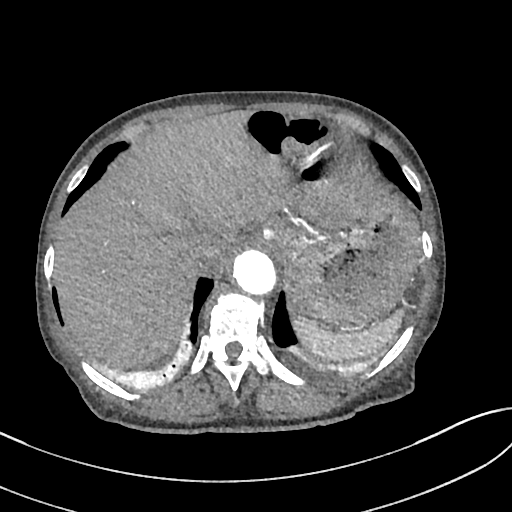
[im 224/310  soft-tissue]
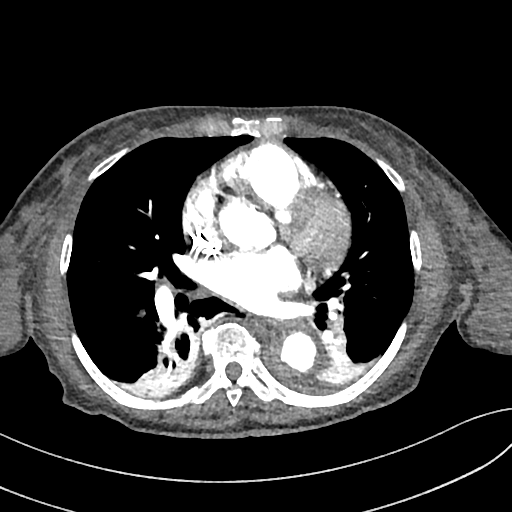
[im 258/310  soft-tissue]
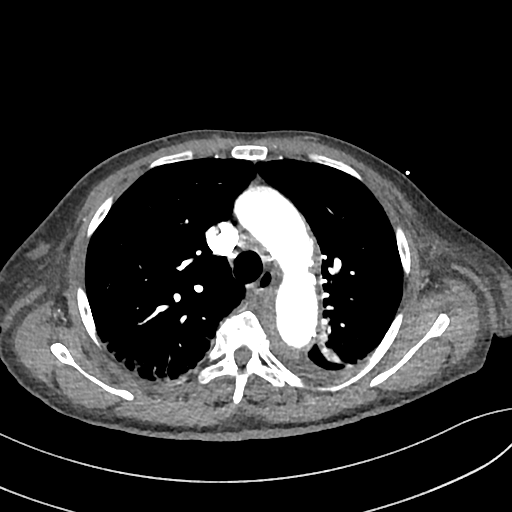
[im 292/310  soft-tissue]
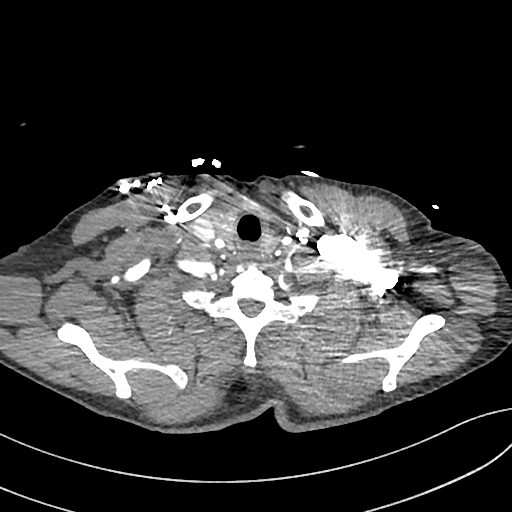
[im 292/310  bone]
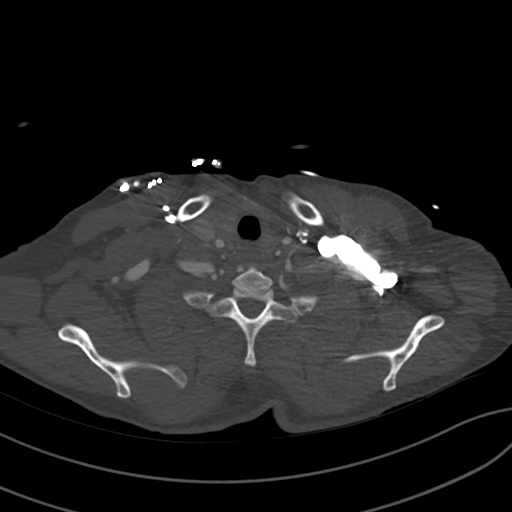

[Series 10: cor · coronal · 0.64mm/px · 3 of 149 slices shown]
[im 38/149  soft-tissue]
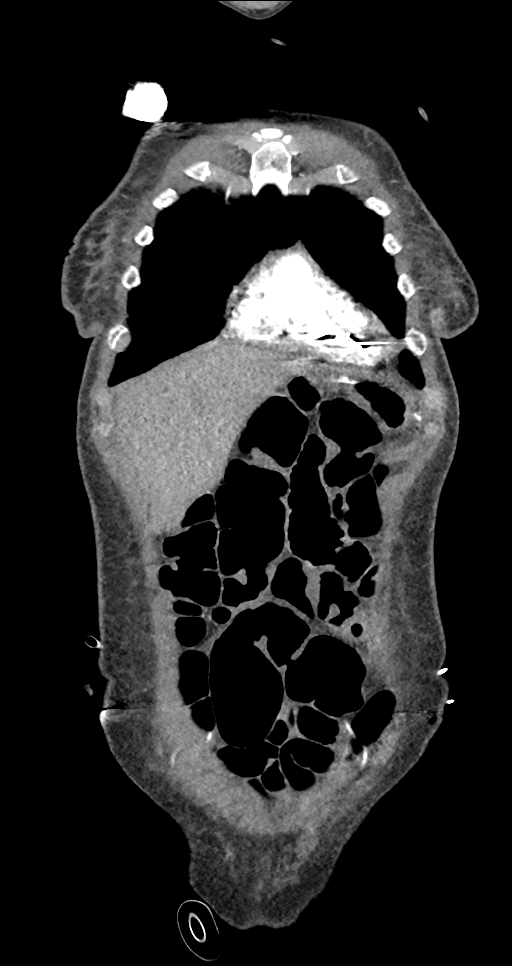
[im 75/149  soft-tissue]
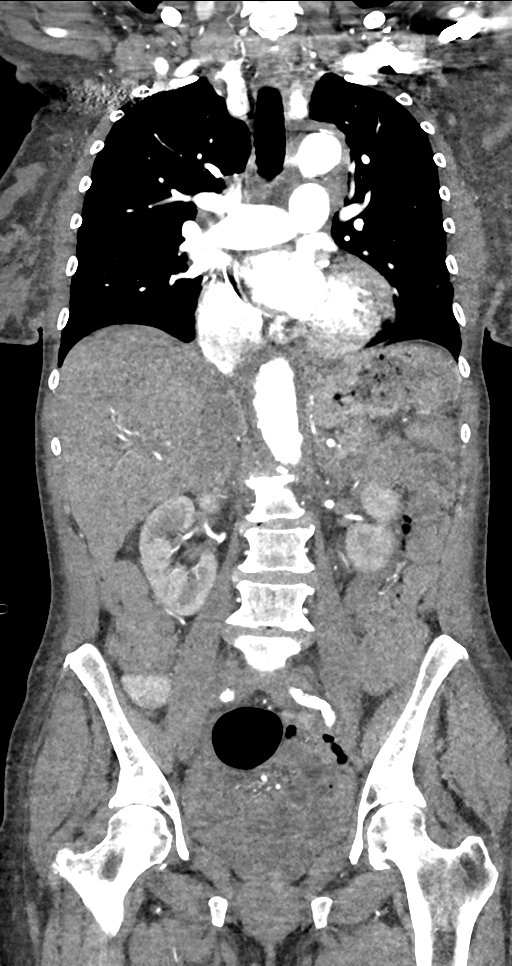
[im 112/149  soft-tissue]
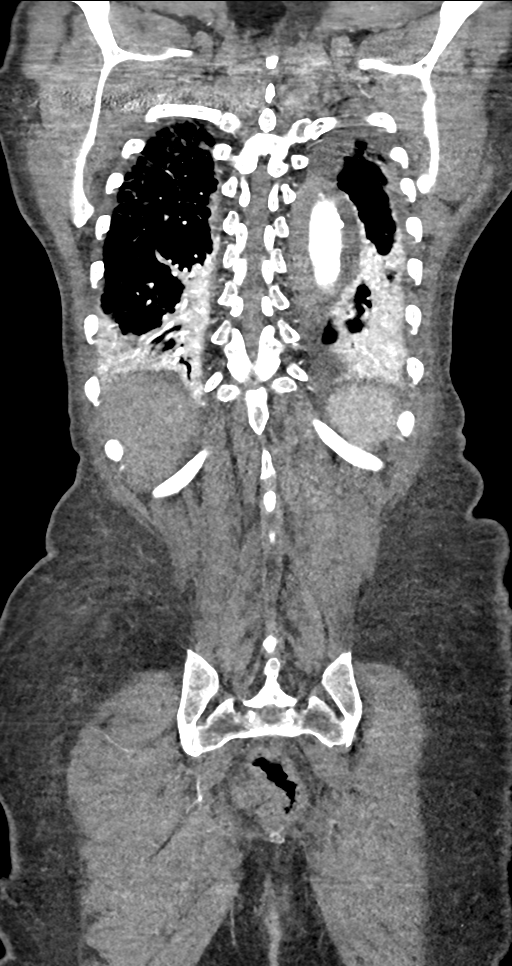

[12 of 46 positions shown; findings below may reference images not displayed]

Multidetector CT imaging through the chest, abdomen and pelvis was
performed using the standard protocol during bolus administration of
intravenous contrast. Multiplanar reconstructed images and MIPs were
obtained and reviewed to evaluate the vascular anatomy.

CONTRAST:  100mL OMNIPAQUE IOHEXOL 350 MG/ML SOLN
FINDINGS: CTA CHEST FINDINGS

Cardiovascular: Again noted is an acute appearing intramural
hematoma that appears to be arising distal to the takeoff of the
left subclavian artery and terminating at the level of the proximal
abdominal aorta. Intramural hematoma peers to measure up to
approximately 1.2 cm in thickness, essentially stable from prior
study. The previously demonstrated possible penetrating ulcer
arising from the descending thoracic aorta is no longer well
appreciated there are atherosclerotic changes of the aorta. The
ascending aorta is aneurysmal measuring approximately 4 cm in
diameter (axial series 7, image 75). There are coronary artery
calcifications. There is a dual chamber pacemaker in place. The
heart size is mildly enlarged without evidence for large pericardial
effusion. The arch vessels are patent. There is no large centrally
located pulmonary embolism

Mediastinum/Nodes:

-- No mediastinal lymphadenopathy.

-- No hilar lymphadenopathy.

-- No axillary lymphadenopathy.

-- No supraclavicular lymphadenopathy.

-- Normal thyroid gland where visualized.

-there appears to be some mild diffuse wall thickening of the
esophagus.

Lungs/Pleura: There are small bilateral pleural effusions. Bibasilar
atelectasis is noted. There are emphysematous changes bilaterally.
There is mild interlobular septal thickening. The trachea is
unremarkable. There is no pneumothorax.

Musculoskeletal: No chest wall abnormality. No bony spinal canal
stenosis.

Review of the MIP images confirms the above findings.

CTA ABDOMEN AND PELVIS FINDINGS

VASCULAR

Aorta: The acute intramural hematoma appears to terminate at the
level of the renal arteries. There are atherosclerotic changes of
the abdominal aorta without evidence for abdominal aortic aneurysm.

Celiac: Patent without evidence of aneurysm, dissection, vasculitis
or significant stenosis.

SMA: Patent without evidence of aneurysm, dissection, vasculitis or
significant stenosis.

Renals: There is mild narrowing of the proximal right renal artery.
There is a high-grade stenosis involving the proximal left renal
artery (axial series 7, image 162).

IMA: Patent without evidence of aneurysm, dissection, vasculitis or
significant stenosis.

Inflow: Patent without evidence of aneurysm, dissection, vasculitis
or significant stenosis.

Veins: No obvious venous abnormality within the limitations of this
arterial phase study.

Review of the MIP images confirms the above findings.

NON-VASCULAR

Hepatobiliary: The liver is normal. Normal gallbladder.There is no
biliary ductal dilation.

Pancreas: Normal contours without ductal dilatation. No
peripancreatic fluid collection.

Spleen: There is a stable small cystic appearing lesion in the
spleen.

Adrenals/Urinary Tract:

--Adrenal glands: Unremarkable.

--Right kidney/ureter: No hydronephrosis or radiopaque kidney
stones.

--Left kidney/ureter: No hydronephrosis or radiopaque kidney stones.

--Urinary bladder: Unremarkable.

Stomach/Bowel:

--Stomach/Duodenum: No hiatal hernia or other gastric abnormality.
Normal duodenal course and caliber.

--Small bowel: Unremarkable.

--Colon: Unremarkable.

--Appendix: Normal.

Lymphatic:

--No retroperitoneal lymphadenopathy.

--No mesenteric lymphadenopathy.

--No pelvic or inguinal lymphadenopathy.

Reproductive: Unremarkable

Other: No ascites or free air. The abdominal wall is normal.

Musculoskeletal. No acute displaced fractures.

Review of the MIP images confirms the above findings.
IMPRESSION: 1. Overall stable appearance of acute intramural hematoma arising
distal to the takeoff of the left subclavian artery and terminating
at the level of the proximal abdominal aorta.
2. Previously demonstrated possible penetrating ulcer arising from
the descending thoracic aorta is no longer well appreciated and has
likely resolved in the interim.
3. Small bilateral pleural effusions with mild interlobular septal
thickening suggestive of volume overload.
4. High-grade stenosis involving the proximal left renal artery.
5. Mild diffuse wall thickening of the esophagus may be secondary to
underdistention or esophagitis.

Aortic Atherosclerosis (193LA-9JB.B) and Emphysema (193LA-J66.9).

## 2021-10-07 ENCOUNTER — Other Ambulatory Visit: Payer: Self-pay | Admitting: *Deleted

## 2021-10-07 MED ORDER — STIOLTO RESPIMAT 2.5-2.5 MCG/ACT IN AERS: 2.0000 | INHALATION_SPRAY | Freq: Every day | RESPIRATORY_TRACT | 5 refills | Status: DC

## 2021-10-16 ENCOUNTER — Ambulatory Visit (INDEPENDENT_AMBULATORY_CARE_PROVIDER_SITE_OTHER): Payer: Medicare Other | Admitting: Pulmonary Disease

## 2021-10-16 ENCOUNTER — Encounter: Payer: Self-pay | Admitting: Pulmonary Disease

## 2021-10-16 VITALS — BP 130/82 | HR 82 | Ht 65.0 in | Wt 180.0 lb

## 2021-10-16 DIAGNOSIS — J438 Other emphysema: Secondary | ICD-10-CM

## 2021-10-16 DIAGNOSIS — R251 Tremor, unspecified: Secondary | ICD-10-CM | POA: Diagnosis not present

## 2021-10-16 DIAGNOSIS — G4734 Idiopathic sleep related nonobstructive alveolar hypoventilation: Secondary | ICD-10-CM

## 2021-10-16 DIAGNOSIS — R296 Repeated falls: Secondary | ICD-10-CM

## 2021-10-16 DIAGNOSIS — R0609 Other forms of dyspnea: Secondary | ICD-10-CM | POA: Diagnosis not present

## 2021-10-16 NOTE — Patient Instructions (Signed)
Nice to see you again  Continue Stiolto 2 puffs once a day every day.  I am glad this is helped some.  Continue to use oxygen 1 L at night.  I sent referral to neurology for your falls and your tremors.  Return to clinic in 6 months or sooner as needed with Dr. Silas Flood

## 2021-10-16 NOTE — Progress Notes (Signed)
$'@Patient'F$  ID: Stephannie Li, female    DOB: [birth year], 71 y.o.   MRN: [id number]  No chief complaint on file.    Referring provider: Antonietta Jewel, MD  HPI:   71 y.o. woman whom we are seeing in follow up for evaluation of history of oxygen use and DOE.   No PFTs despite ordered in past. Last visit stiolto was prescribed given emphysema on CT and DOE as possibly air trapping is contributing.  She finds it has been beneficial.  Helps a bit during the day.  Notices a little bit short when this in the morning and after using it as this improves in the day.  She reports falls at home as well as some tremor in her arms.  She is unsure what this is.  No tremor on exam today.  HPI at initial visit: Patient was mid to the hospital 06/2020 with severe allergic/near anaphylactic reaction.  Seem to be the antibiotic.  VBG showed hypercarbia PCO2 in the 60s.  Reportedly hypoxemic.  Very wheezy.  Treated with supportive care.  Received antihistamines, epinephrine injection, steroids.  Gradually improved.  Was discharged on 2 L oxygen after approximate 24 hours in the hospital.  Relatively fast discharge especially with new oxygen requirement.  Quickly stopped using at home.  Notes checks her oxygen saturation.  Lowest this gets as 93% during the day when she is walking around.  She continues 1 L at night.  She does this because she was told to.  No overnight oximetry.  She endorses ongoing smoking of cigarettes.  Has cut down.  Wishes to decrease further.  Cites son's illness in [place] stressor.  She denies any real breathing issues.  Prior to that acute illness, no breathing issues.  No respiratory issues.  No dyspnea on exertion.  He is not limited in any way from exertional capacity.  Occasional cough but not bothersome.  Never had PFTs per her report.  Review chest x-ray during admission 06-2020 on my interpretation reveals clear lungs bilaterally.   PMH: Tobacco abuse in remission Surgical  history: Hysterectomy, tubal ligation, hernia repair, endarterectomy Family history: Father with prostate cancer, mother with CAD, hypertension, CKD Social history: Current smoker, does not have a pack a day.  50-pack-year smoking history, smoked a pack a day for 50 years before cutting down recently, lives in [place] / Pulmonary Flowsheets:   ACT:      No data to display          MMRC:     No data to display          Epworth:      No data to display          Tests:   FENO:  No results found for: "NITRICOXIDE"  PFT:     No data to display          WALK:      No data to display          Imaging: Personally reviewed and as per EMR discussion this note  Lab Results: Personally reviewed, elevated eosinophils notably CBC    Component Value Date/Time   WBC 9.7 06/26/2020 0538   RBC 4.38 06/26/2020 0538   HGB 11.9 (L) 07/24/2020 0809   HCT 35.0 (L) 07/24/2020 0809   PLT 274 06/26/2020 0538   MCV 94.1 06/26/2020 0538   MCH 29.9 06/26/2020 0538   MCHC 31.8 06/26/2020 0538   RDW 19.0 (H) 06/26/2020 3976  LYMPHSABS 1.2 06/25/2020 0919   MONOABS 0.9 06/25/2020 0919   EOSABS 0.4 06/25/2020 0919   BASOSABS 0.1 06/25/2020 0919    BMET    Component Value Date/Time   NA 142 07/24/2020 0809   K 3.2 (L) 07/24/2020 0809   CL 101 07/24/2020 0809   CO2 27 06/26/2020 0538   GLUCOSE 104 (H) 07/24/2020 0809   BUN 22 07/24/2020 0809   CREATININE 1.40 (H) 07/24/2020 0809   CALCIUM 8.7 (L) 06/26/2020 0538   GFRNONAA 42 (L) 06/26/2020 0538   GFRAA 48 (L) 09/19/2019 0140    BNP    Component Value Date/Time   BNP 147.0 (H) 01/29/2020 0040    ProBNP No results found for: "PROBNP"  Specialty Problems       Pulmonary Problems   OBSTRUCTIVE SLEEP APNEA    Qualifier: Diagnosis of  By: Jenny Reichmann MD, Hunt Oris       ASTHMATIC BRONCHITIS, ACUTE    Qualifier: Diagnosis of  By: Jenny Reichmann MD, Hunt Oris       Hypoxia   COPD (chronic  obstructive pulmonary disease) ([place])   Acute respiratory failure with hypoxia and hypercarbia ([place])   COPD with acute exacerbation (HCC)    Allergies  Allergen Reactions   Amlodipine Swelling    Started very close to episode of angioedema to ACE, unknown if this added effects or just residual   Lisinopril     angioedema   Losartan Swelling    Lip swelling    Codeine Phosphate Other (See Comments)    REACTION: UNKNOWN   Morphine And Related Other (See Comments)    Cannot take:Heart problems   Naproxen Nausea And Vomiting   Nsaids Nausea Only   Penicillins Other (See Comments)    UNKNOWN REACTION   Propoxyphene N-Acetaminophen Other (See Comments)    Makes me gag   Sulfamethoxazole-Trimethoprim Other (See Comments)    Gi upset   Flexeril [Cyclobenzaprine] Anxiety and Other (See Comments)    Pt states medication gives her nightmares and insomnia.    Immunization History  Administered Date(s) Administered   H1N1 02/16/2008   Influenza Whole 11/22/2008   PFIZER(Purple Top)SARS-COV-2 Vaccination 06/25/2019, 08/22/2019   Pfizer Covid-19 Vaccine Bivalent Booster 76yr & up 02/24/2020, 09/21/2020   Pneumococcal Polysaccharide-23 05/21/2006    Past Medical History:  Diagnosis Date   Anemia    Anxiety    Arthritis    Atrial fib/flutter, transient    Cataract    COPD (chronic obstructive pulmonary disease) (HCC)    Depression    Diabetes mellitus    Diverticulosis    DJD (degenerative joint disease)    GERD (gastroesophageal reflux disease)    Gout    History of pneumonia    Hypertension    Hypothyroidism    Internal hemorrhoids     Tobacco History: Social History   Tobacco Use  Smoking Status Every Day   Packs/day: 1.00   Years: 50.00   Total pack years: 50.00   Types: Cigarettes   Last attempt to quit: 11/20/2020   Years since quitting: 0.9   Passive exposure: Never  Smokeless Tobacco Never  Tobacco Comments   7 cigarettes daily   Ready to quit: Not  Answered Counseling given: Not Answered Tobacco comments: 7 cigarettes daily   Outpatient Encounter Medications as of 10/16/2021  Medication Sig   aspirin 81 MG tablet Take 81 mg by mouth every morning.    carvedilol (COREG) 25 MG tablet Take 1 tablet (25 mg total) by mouth  2 (two) times daily with a meal.   cholecalciferol (VITAMIN D) 1000 UNITS tablet Take 1,000 Units by mouth every morning.    cloNIDine (CATAPRES) 0.2 MG tablet Take 1 tablet (0.2 mg total) by mouth 3 (three) times daily.   clopidogrel (PLAVIX) 75 MG tablet Take 75 mg by mouth daily.   gabapentin (NEURONTIN) 100 MG capsule Take 1 capsule (100 mg total) by mouth 3 (three) times daily.   hydrALAZINE (APRESOLINE) 100 MG tablet Take 100 mg by mouth 3 (three) times daily.   hydrochlorothiazide (HYDRODIURIL) 12.5 MG tablet Take 12.5 mg by mouth daily.   levothyroxine (SYNTHROID) 25 MCG tablet Take 1 tablet (25 mcg total) by mouth daily.   lovastatin (MEVACOR) 20 MG tablet Take 20 mg by mouth at bedtime.   Multiple Vitamin (MULTIVITAMIN) tablet Take 1 tablet by mouth every morning.    nicotine (NICODERM CQ - DOSED IN MG/24 HOURS) 14 mg/24hr patch Place 1 patch (14 mg total) onto the skin daily.   nicotine polacrilex (NICORETTE MINI) 2 MG lozenge Take 1 lozenge (2 mg total) by mouth as needed for smoking cessation.   Oxycodone HCl 10 MG TABS Take 10 mg by mouth 4 (four) times daily as needed.   OXYGEN Inhale 1 L into the lungs at bedtime.   pantoprazole (PROTONIX) 40 MG tablet Take 1 tablet (40 mg total) by mouth daily.   polyethylene glycol (MIRALAX / GLYCOLAX) packet Take 17 g by mouth daily.   rosuvastatin (CRESTOR) 20 MG tablet Take 1 tablet (20 mg total) by mouth daily.   sertraline (ZOLOFT) 100 MG tablet Take 100 mg by mouth at bedtime.   Tiotropium Bromide-Olodaterol (STIOLTO RESPIMAT) 2.5-2.5 MCG/ACT AERS Inhale 2 puffs into the lungs daily.   glimepiride (AMARYL) 1 MG tablet Take 1 tablet (1 mg total) by mouth every  morning.   No facility-administered encounter medications on file as of 10/16/2021.     Review of Systems  Review of Systems  N/a Physical Exam  BP 130/82   Pulse 82   Ht '5\' 5"'$  (1.651 m)   Wt 180 lb (81.6 kg)   SpO2 95%   BMI 29.95 kg/m   Wt Readings from Last 5 Encounters:  10/16/21 180 lb (81.6 kg)  07/17/21 177 lb 12.8 oz (80.6 kg)  06/03/21 174 lb 1.6 oz (79 kg)  03/11/21 171 lb (77.6 kg)  02/05/21 176 lb (79.8 kg)    BMI Readings from Last 5 Encounters:  10/16/21 29.95 kg/m  07/17/21 29.59 kg/m  06/03/21 28.97 kg/m  03/11/21 28.46 kg/m  02/05/21 29.29 kg/m    Physical Exam General: Well-appearing, no acute distress Eyes: EOMI, icterus Neck: Supple, no JVP Pulmonary: Distant, clear, normal work of breathing Cardiovascular: Regular in rhythm, no murmur Abdomen: Nondistended, bowel sounds present MSK: No synovitis, no joint effusion Neuro: Normal gait, no weakness Psych: Normal mood, full affect   Assessment & Plan:   History of severe allergic reaction/anaphylaxis: Presented with hypercarbia and hypoxemia.  Suspect this was acute bronchospasm.  Possible underlying COPD.  Emphysema seen on CT scan.  DOE: Likely multifactorial related to deconditioning as well as smoking-related disease.  Emphysema on CT scan.  No PFTs.  Stiolto for bronchodilation has helped  Tobacco abuse: Smoking assessment and cessation counseling Patient currently smoking. I have advised the patient to quit/stop smoking as soon as possible due to high risk for multiple medical problems.  It will also be very difficult for [place] to manage patient's  respiratory symptoms and status  if we continue to expose her lungs to a known irritant.  We do not advise e-cigarettes as a form of stopping smoking. Patient is willing to quit smoking. I have advised the patient that we can assist and have options of nicotine replacement therapy, provided smoking cessation education today, provided smoking  cessation counseling, and provided cessation resources. Advise gradual reduction in cigarettes every week or 2.  She expressed understanding. I spent 4 minutes in tobacco cessation counseling.   Nocturnal hypoxemia: Confirmed on overnight oximetry.  Using 1 L nasal cannula to continue at night.  Tremor/falls: Recent over the last few weeks.  None on exam.  Not seen at rest.  Wonder if there is an intention tremor.  May or may not be related to falls.  Referral to neurology for further evaluation.  Return in about 6 months (around 04/16/2022).   Lanier Clam, MD 10/16/2021

## 2021-10-18 ENCOUNTER — Encounter: Payer: Self-pay | Admitting: Neurology

## 2021-10-30 NOTE — Progress Notes (Signed)
Assessment/Plan:    Tremor None seen on examination Wonder if inhalers contribute Reassurance provided Reports her TSH is regularly checked by pcp and is on synthroid Hand weakness We will do emg to r/o entrapment neuropathy Don't see evidence of diabetic PN although she would be at risk She states that last A1C was in the 6.0 range.   3.  Falls  a.  Was asked to eval this but none were really falls.  In 1 of these, she fell asleep on the toilet and fell off of it.  And another episode, she was already asleep and fell out of the bed.  She does talk a lot about excessive daytime hypersomnolence.  She tells me she has had a sleep study done, but I could not find it.  She denies sleep apnea, but it is written in several physician's notes.  She is not on CPAP, but is on 1 L of oxygen at night.  I told her that I was just going to contact the pulmonologist and let him know about the spells, since he was more familiar with the nature of her sleep apnea.  Hopefully, they can work this up further.  Subjective:   [name] was seen today in the movement disorders clinic for neurologic consultation at the request of Hunsucker, Bonna Gains, MD.  The consultation is for the evaluation of falls and tremor.  Medical records made available to me are reviewed.  Patient is a 71 year old with possible COPD who saw the pulmonologist and was complaining about tremor (not noted on his examination) and falls.  She was referred for further evaluation.    Tremor: Yes.     How long has it been going on? 4 years, getting worse - upon further questioning its not really a tremor but more of dropping and grasping objects.  She has a little shaking but the big issue is dropping objects.  She has trouble opening objects like milk jugs.   No paresthesias of the fingers.  She is diabetic/prediabetic for 6 years.  She is on glimepiride  At rest or with activation?  activation  Located where?  Both hands with trouble  holding objects  Affected by caffeine:  she does shake coffee within the saucer  Affected by alcohol:  she doesn't drink any  Spills soup if on spoon:  doesn't eat any  Spills glass of liquid if full:  uses straws  Tremor inducing meds:  Yes.  , inhaler  Also c/o balance trouble.  States that she has "weird falls."  "I can fall off a commode and wonder if I have gone to sleep."  States that she has EDS and easily falls asleep for hours without trying.  States "I have no problem with driving though."  She states that another fall was from the bed, but she was asleep.  None of the falls were with walking.    CT brain performed in January, 2022 and was negative.  She was in the hospital at that time for possible strokelike symptoms (does not look like that was ever confirmed).  PREVIOUS MEDICATIONS: none to date  ALLERGIES:   Allergies  Allergen Reactions   Amlodipine Swelling    Started very close to episode of angioedema to ACE, unknown if this added effects or just residual   Lisinopril     angioedema   Losartan Swelling    Lip swelling    Codeine Phosphate Other (See Comments)    REACTION: UNKNOWN  Morphine And Related Other (See Comments)    Cannot take:Heart problems   Naproxen Nausea And Vomiting   Nsaids Nausea Only   Penicillins Other (See Comments)    UNKNOWN REACTION   Propoxyphene N-Acetaminophen Other (See Comments)    Makes me gag   Sulfamethoxazole-Trimethoprim Other (See Comments)    Gi upset   Flexeril [Cyclobenzaprine] Anxiety and Other (See Comments)    Pt states medication gives her nightmares and insomnia.    CURRENT MEDICATIONS:  Current Outpatient Medications  Medication Instructions   aspirin 81 mg, Oral, Every morning   carvedilol (COREG) 25 mg, Oral, 2 times daily with meals   cholecalciferol (VITAMIN D) 1,000 Units, Oral, Every morning   cloNIDine (CATAPRES) 0.2 mg, Oral, 3 times daily   clopidogrel (PLAVIX) 75 mg, Oral, Daily   gabapentin  (NEURONTIN) 100 mg, Oral, 3 times daily   glimepiride (AMARYL) 1 mg, Oral, BH-each morning   hydrALAZINE (APRESOLINE) 100 mg, Oral, 3 times daily   hydrochlorothiazide (HYDRODIURIL) 12.5 mg, Oral, Daily   levothyroxine (SYNTHROID) 25 mcg, Oral, Daily   lovastatin (MEVACOR) 20 mg, Oral, Daily at bedtime   Multiple Vitamin (MULTIVITAMIN) tablet 1 tablet, Oral, Every morning   nicotine (NICODERM CQ - DOSED IN MG/24 HOURS) 14 mg, Transdermal, Daily   nicotine polacrilex (NICORETTE MINI) 2 mg, Oral, As needed   Oxycodone HCl 10 mg, Oral, 4 times daily PRN   OXYGEN 1 L, Inhalation, Daily at bedtime   pantoprazole (PROTONIX) 40 mg, Oral, Daily   polyethylene glycol (MIRALAX / GLYCOLAX) 17 g, Oral, Daily   rosuvastatin (CRESTOR) 20 mg, Oral, Daily   sertraline (ZOLOFT) 100 mg, Oral, Daily at bedtime   Tiotropium Bromide-Olodaterol (STIOLTO RESPIMAT) 2.5-2.5 MCG/ACT AERS 2 puffs, Inhalation, Daily    Objective:   PHYSICAL EXAMINATION:    VITALS:   Vitals:   11/01/21 0924  BP: (!) 168/78  Weight: 181 lb 3.2 oz (82.2 kg)  Height: '5\' 5"'$  (1.651 m)    GEN:  The patient appears stated age and is in NAD. HEENT:  Normocephalic, atraumatic.  The mucous membranes are moist. The superficial temporal arteries are without ropiness or tenderness. CV:  RRR Lungs:  CTAB Neck/HEME:  There are no carotid bruits bilaterally.  Neurological examination:  Orientation: The patient is alert and oriented x3.  Cranial nerves: There is good facial symmetry.  Extraocular muscles are intact. The visual fields are full to confrontational testing. The speech is fluent and clear. Soft palate rises symmetrically and there is no tongue deviation. Hearing is intact to conversational tone. Sensation: Sensation is intact to light touch throughout (facial, trunk, extremities). Vibration is intact at the bilateral big toe. There is no extinction with double simultaneous stimulation.  Motor: Strength is 5/5 in the bilateral  upper and lower extremities.  Slight decreased grip in the bilateral upper extremities, but it did improve with encouragement.   Shoulder shrug is equal and symmetric.  There is no pronator drift. Deep tendon reflexes: Deep tendon reflexes are 2/4 at the bilateral biceps, triceps, brachioradialis, patella and 1/4 at the bilateral achilles. Plantar responses are downgoing bilaterally.  Movement examination: Tone: There is normal tone in the bilateral upper extremities.  The tone in the lower extremities is normal.  Abnormal movements: There is no rest tremor.  There was no postural tremor.  There was no intention tremor.  She had no trouble with Archimedes spirals.  She was able to pour water from 1 glass to another without significant tremor (did  spill some because of the fact that she was going quickly). Coordination:  There is no decremation with RAM's, with any form of RAMS, including alternating supination and pronation of the forearm, hand opening and closing, finger taps, heel taps and toe taps.  Gait and Station: The patient has no difficulty arising out of a deep-seated chair without the use of the hands. The patient's stride length is good with good arm swing.   I have reviewed and interpreted the following labs independently   Chemistry      Component Value Date/Time   NA 142 07/24/2020 0809   K 3.2 (L) 07/24/2020 0809   CL 101 07/24/2020 0809   CO2 27 06/26/2020 0538   BUN 22 07/24/2020 0809   CREATININE 1.40 (H) 07/24/2020 0809      Component Value Date/Time   CALCIUM 8.7 (L) 06/26/2020 0538   ALKPHOS 57 06/25/2020 0919   AST 32 06/25/2020 0919   ALT 21 06/25/2020 0919   BILITOT 0.5 06/25/2020 0919      Lab Results  Component Value Date   TSH 0.111 (L) 09/05/2019   Lab Results  Component Value Date   WBC 9.7 06/26/2020   HGB 11.9 (L) 07/24/2020   HCT 35.0 (L) 07/24/2020   MCV 94.1 06/26/2020   PLT 274 06/26/2020    Lab Results  Component Value Date   HGBA1C 7.4  (H) 06/25/2020     Total time spent on today's visit was 45 minutes, including both face-to-face time and nonface-to-face time.  Time included that spent on review of records (prior notes available to me/labs/imaging if pertinent), discussing treatment and goals, answering patient's questions and coordinating care.  Cc:  Antonietta Jewel, MD

## 2021-11-01 ENCOUNTER — Encounter: Payer: Self-pay | Admitting: Neurology

## 2021-11-01 ENCOUNTER — Ambulatory Visit (INDEPENDENT_AMBULATORY_CARE_PROVIDER_SITE_OTHER): Payer: Medicare Other | Admitting: Neurology

## 2021-11-01 VITALS — BP 168/78 | HR 62 | Ht 65.0 in | Wt 181.2 lb

## 2021-11-01 DIAGNOSIS — G4719 Other hypersomnia: Secondary | ICD-10-CM | POA: Diagnosis not present

## 2021-11-01 DIAGNOSIS — R200 Anesthesia of skin: Secondary | ICD-10-CM

## 2021-11-01 NOTE — Progress Notes (Signed)
Pt stated she has day time drowsiness and will go to sleep sitting up and wake up a few hours later.

## 2021-11-26 ENCOUNTER — Ambulatory Visit: Payer: Medicare Other | Admitting: Neurology

## 2021-12-03 ENCOUNTER — Encounter: Payer: Medicare Other | Admitting: Neurology

## 2021-12-12 ENCOUNTER — Emergency Department (HOSPITAL_COMMUNITY)
Admission: EM | Admit: 2021-12-12 | Discharge: 2021-12-12 | Disposition: A | Discharge status: Home / Self Care | Payer: Medicare Other | Attending: Student | Admitting: Student

## 2021-12-12 ENCOUNTER — Encounter (HOSPITAL_COMMUNITY): Payer: Self-pay | Admitting: Emergency Medicine

## 2021-12-12 ENCOUNTER — Other Ambulatory Visit: Payer: Self-pay

## 2021-12-12 DIAGNOSIS — F17210 Nicotine dependence, cigarettes, uncomplicated: Secondary | ICD-10-CM | POA: Diagnosis not present

## 2021-12-12 DIAGNOSIS — Z79899 Other long term (current) drug therapy: Secondary | ICD-10-CM | POA: Insufficient documentation

## 2021-12-12 DIAGNOSIS — E119 Type 2 diabetes mellitus without complications: Secondary | ICD-10-CM | POA: Diagnosis not present

## 2021-12-12 DIAGNOSIS — I10 Essential (primary) hypertension: Secondary | ICD-10-CM | POA: Insufficient documentation

## 2021-12-12 DIAGNOSIS — Z7982 Long term (current) use of aspirin: Secondary | ICD-10-CM | POA: Insufficient documentation

## 2021-12-12 DIAGNOSIS — M7989 Other specified soft tissue disorders: Secondary | ICD-10-CM

## 2021-12-12 DIAGNOSIS — E039 Hypothyroidism, unspecified: Secondary | ICD-10-CM | POA: Diagnosis not present

## 2021-12-12 DIAGNOSIS — Z8616 Personal history of COVID-19: Secondary | ICD-10-CM | POA: Diagnosis not present

## 2021-12-12 DIAGNOSIS — Z7902 Long term (current) use of antithrombotics/antiplatelets: Secondary | ICD-10-CM | POA: Diagnosis not present

## 2021-12-12 DIAGNOSIS — J449 Chronic obstructive pulmonary disease, unspecified: Secondary | ICD-10-CM | POA: Diagnosis not present

## 2021-12-12 DIAGNOSIS — R600 Localized edema: Secondary | ICD-10-CM | POA: Insufficient documentation

## 2021-12-12 LAB — COMPREHENSIVE METABOLIC PANEL
ALT: 11 U/L (ref 0–44)
AST: 17 U/L (ref 15–41)
Albumin: 3.7 g/dL (ref 3.5–5.0)
Alkaline Phosphatase: 67 U/L (ref 38–126)
Anion gap: 5 (ref 5–15)
BUN: 27 mg/dL — ABNORMAL HIGH (ref 8–23)
CO2: 29 mmol/L (ref 22–32)
Calcium: 9 mg/dL (ref 8.9–10.3)
Chloride: 108 mmol/L (ref 98–111)
Creatinine, Ser: 1.65 mg/dL — ABNORMAL HIGH (ref 0.44–1.00)
GFR, Estimated: 33 mL/min — ABNORMAL LOW (ref >60)
Glucose, Bld: 97 mg/dL (ref 70–99)
Potassium: 3.8 mmol/L (ref 3.5–5.1)
Sodium: 142 mmol/L (ref 135–145)
Total Bilirubin: 0.6 mg/dL (ref 0.3–1.2)
Total Protein: 7.3 g/dL (ref 6.5–8.1)

## 2021-12-12 LAB — CBC WITH DIFFERENTIAL/PLATELET
Abs Immature Granulocytes: 0.02 10*3/uL (ref 0.00–0.07)
Basophils Absolute: 0.1 10*3/uL (ref 0.0–0.1)
Basophils Relative: 1 %
Eosinophils Absolute: 0.3 10*3/uL (ref 0.0–0.5)
Eosinophils Relative: 4 %
HCT: 37 % (ref 36.0–46.0)
Hemoglobin: 12 g/dL (ref 12.0–15.0)
Immature Granulocytes: 0 %
Lymphocytes Relative: 18 %
Lymphs Abs: 1.5 10*3/uL (ref 0.7–4.0)
MCH: 29.9 pg (ref 26.0–34.0)
MCHC: 32.4 g/dL (ref 30.0–36.0)
MCV: 92 fL (ref 80.0–100.0)
Monocytes Absolute: 0.7 10*3/uL (ref 0.1–1.0)
Monocytes Relative: 9 %
Neutro Abs: 5.6 10*3/uL (ref 1.7–7.7)
Neutrophils Relative %: 68 %
Platelets: 217 10*3/uL (ref 150–400)
RBC: 4.02 MIL/uL (ref 3.87–5.11)
RDW: 17.8 % — ABNORMAL HIGH (ref 11.5–15.5)
WBC: 8.2 10*3/uL (ref 4.0–10.5)
nRBC: 0 % (ref 0.0–0.2)

## 2021-12-12 LAB — BRAIN NATRIURETIC PEPTIDE: B Natriuretic Peptide: 353 pg/mL — ABNORMAL HIGH (ref 0.0–100.0)

## 2021-12-12 MED ORDER — APIXABAN 5 MG PO TABS
10.0000 mg | ORAL_TABLET | Freq: Once | ORAL | Status: AC
  Administered 2021-12-12: 10 mg via ORAL
  Filled 2021-12-12: qty 2

## 2021-12-12 MED ORDER — HYDRALAZINE HCL 25 MG PO TABS
100.0000 mg | ORAL_TABLET | Freq: Three times a day (TID) | ORAL | Status: DC
  Administered 2021-12-12: 100 mg via ORAL
  Filled 2021-12-12: qty 4

## 2021-12-12 MED ORDER — CARVEDILOL 12.5 MG PO TABS
25.0000 mg | ORAL_TABLET | Freq: Two times a day (BID) | ORAL | Status: DC
  Administered 2021-12-12: 25 mg via ORAL
  Filled 2021-12-12: qty 2

## 2021-12-12 MED ORDER — CLONIDINE HCL 0.2 MG PO TABS
0.2000 mg | ORAL_TABLET | Freq: Three times a day (TID) | ORAL | Status: DC
  Administered 2021-12-12: 0.2 mg via ORAL
  Filled 2021-12-12: qty 1

## 2021-12-12 NOTE — Discharge Instructions (Addendum)
You were seen in the emergency department for evaluation of leg swelling.  I do have concern for possible DVT in the leg but we do not have ultrasound capability at night.  Please return the emergency department for first tomorrow morning to receive your scan.  You have been given 1 dose of a blood thinner that will hold you over until tomorrow, and we will continue this therapy if there is a blood clot.

## 2021-12-12 NOTE — ED Notes (Signed)
Went over dc papers. All questions answered. 

## 2021-12-12 NOTE — ED Triage Notes (Addendum)
Pt c/o right lower leg swelling that she noticed this morning. Pt states she tried to keep it elevated today but the swelling has increased.   Hx of DVT's, currently takes Plavix and ASA daily.

## 2021-12-12 NOTE — ED Notes (Signed)
ED Provider at bedside. 

## 2021-12-13 ENCOUNTER — Ambulatory Visit (HOSPITAL_COMMUNITY): Admission: RE | Admit: 2021-12-13 | Payer: Medicare Other | Source: Ambulatory Visit

## 2021-12-13 NOTE — ED Provider Notes (Signed)
[facility] EMERGENCY DEPARTMENT Provider Note  CSN: [id number] Arrival date & time: 12/12/21 1843  Chief Complaint(s) Leg Swelling  HPI [name] is a 71 y.o. 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  Patient states that she has noticed worsening right lower extremity edema and right calf pain started today.  She has been elevating the leg appropriately but symptoms are not improved and she is worried that she has a blood clot.  Denies chest pain, shortness of breath, abdominal pain, nausea, vomiting, headache, fever or other systemic symptoms.  Patient does arrive hypertensive but has not taken her home blood pressure medications today.   Past Medical History Past Medical History:  Diagnosis Date   Anemia    Anxiety    Arthritis    Atrial fib/flutter, transient    Cataract    COPD (chronic obstructive pulmonary disease) (HCC)    Depression    Diabetes mellitus    Diverticulosis    DJD (degenerative joint disease)    GERD (gastroesophageal reflux disease)    Gout    History of pneumonia    Hypertension    Hypothyroidism    Internal hemorrhoids    Patient Active Problem List   Diagnosis Date Noted   Acute respiratory failure with hypoxia and hypercarbia ([place]) 06/25/2020   COPD with acute exacerbation ([place]) 06/25/2020   Angioedema 06/25/2020   Stroke-like symptoms 01/30/2020   COVID-19 virus infection    Numbness and tingling of left lower extremity 01/29/2020   Paroxysmal atrial fibrillation ([place]) 01/29/2020   COPD (chronic obstructive pulmonary disease) ([place])    AKI (acute kidney injury) ([place])    Encounter for central line placement    Hypotension due to drugs    Hypoxia    Dissection of thoracic aorta (HCC)    Intramural aortic hematoma ([place]) 08/28/2019   Aortic  aneurysm ([place]) 08/28/2019   Hypokalemia 05/06/2017   Abdominal pain 05/06/2017   Chronic atrial fibrillation ([place]) 03/21/2014   Enlarged lymph node 12/29/2011   OTHER&UNSPECIFIED DISEASES THE ORAL SOFT TISSUES 08/17/2008   GOUT 07/10/2008   ANKLE PAIN, RIGHT 07/10/2008   ASTHMATIC BRONCHITIS, ACUTE 05/10/2008   ABDOMINAL PAIN, UNSPECIFIED SITE 02/16/2008   KNEE PAIN, BILATERAL 06/17/2007   DEPRESSIVE DISORDER 12/25/2006   Pain in limb 12/25/2006   Lipoma of unspecified site 09/17/2006   Hypothyroidism 09/17/2006   Type 2 diabetes mellitus with hyperlipidemia ([place]) 09/17/2006   OBSTRUCTIVE SLEEP APNEA 09/17/2006   Hypertension associated with diabetes ([place]) 09/17/2006   BRADYCARDIA, CHRONIC 09/17/2006   HERNIA, SITE NOS W/O OBSTRUCTION/GANGRENE 09/17/2006   DEGENERATIVE JOINT DISEASE, HANDS 09/17/2006   PPM-Medtronic 09/17/2006   Home Medication(s) Prior to Admission medications   Medication Sig Start Date End Date Taking? Authorizing Provider  aspirin 81 MG tablet Take 81 mg by mouth every morning.     [provider]  carvedilol (COREG) 25 MG tablet Take 1 tablet (25 mg total) by mouth 2 (two) times daily with a meal. 10/11/19   Evans Lance, MD  cholecalciferol (VITAMIN D) 1000 UNITS tablet Take 1,000 Units by mouth every morning.     [provider]  cloNIDine (CATAPRES) 0.2 MG tablet Take 1 tablet (0.2 mg total) by mouth 3 (three) times daily. 01/30/20   Barton Dubois, MD  clopidogrel (PLAVIX) 75 MG tablet Take 75 mg by mouth daily.  05/17/21   [provider]  gabapentin (NEURONTIN) 100 MG capsule Take 1 capsule (100 mg total) by mouth 3 (three) times daily. 09/19/19   Pokhrel, Laxman, MD  glimepiride (AMARYL) 1 MG tablet Take 1 tablet (1 mg total) by mouth every morning. 01/30/20 11/01/21  Barton Dubois, MD  hydrALAZINE (APRESOLINE) 100 MG tablet Take 100 mg by mouth 3 (three) times daily. 09/29/19   [provider]  hydrochlorothiazide  (HYDRODIURIL) 12.5 MG tablet Take 12.5 mg by mouth daily.    [provider]  levothyroxine (SYNTHROID) 25 MCG tablet Take 1 tablet (25 mcg total) by mouth daily. 09/19/19   Pokhrel, Corrie Mckusick, MD  lovastatin (MEVACOR) 20 MG tablet Take 20 mg by mouth at bedtime. 06/20/20   [provider]  Multiple Vitamin (MULTIVITAMIN) tablet Take 1 tablet by mouth every morning.     [provider]  nicotine (NICODERM CQ - DOSED IN MG/24 HOURS) 14 mg/24hr patch Place 1 patch (14 mg total) onto the skin daily. 10/16/20   Hunsucker, Bonna Gains, MD  nicotine polacrilex (NICORETTE MINI) 2 MG lozenge Take 1 lozenge (2 mg total) by mouth as needed for smoking cessation. 10/16/20   Hunsucker, Bonna Gains, MD  Oxycodone HCl 10 MG TABS Take 10 mg by mouth 4 (four) times daily as needed. 08/08/19   [provider]  OXYGEN Inhale 1 L into the lungs at bedtime.    [provider]  pantoprazole (PROTONIX) 40 MG tablet Take 1 tablet (40 mg total) by mouth daily. 09/20/19   Pokhrel, Corrie Mckusick, MD  polyethylene glycol (MIRALAX / GLYCOLAX) packet Take 17 g by mouth daily.    [provider]  rosuvastatin (CRESTOR) 20 MG tablet Take 1 tablet (20 mg total) by mouth daily. 09/20/19   Pokhrel, Corrie Mckusick, MD  sertraline (ZOLOFT) 100 MG tablet Take 100 mg by mouth at bedtime.    [provider]  Tiotropium Bromide-Olodaterol (STIOLTO RESPIMAT) 2.5-2.5 MCG/ACT AERS Inhale 2 puffs into the lungs daily. 10/07/21   Hunsucker, Bonna Gains, MD                                                                                                                                    Past Surgical History Past Surgical History:  Procedure Laterality Date   ABDOMINAL AORTOGRAM W/LOWER EXTREMITY N/A 07/24/2020   Procedure: ABDOMINAL AORTOGRAM W/LOWER EXTREMITY;  Surgeon: Serafina Mitchell, MD;  Location: [facility];  Service: Cardiovascular;  Laterality: N/A;   CAROTID STENT  05/2021   CATARACT EXTRACTION  W/ INTRAOCULAR LENS IMPLANT Left    ENDARTERECTOMY Left 09/09/2019   Procedure: LEFT ILIAC ENDARTERECTOMY;  Surgeon: Serafina Mitchell, MD;  Location: [facility];  Service: Vascular;  Laterality: Left;   HERNIA REPAIR     INSERTION OF ILIAC STENT Left 09/09/2019   Procedure: INSERTION OF LEFT ILIAC STENT;  Surgeon: Serafina Mitchell, MD;  Location: [facility];  Service: Vascular;  Laterality: Left;   PACEMAKER INSERTION     PERIPHERAL VASCULAR INTERVENTION Left 07/24/2020   Procedure: PERIPHERAL VASCULAR INTERVENTION;  Surgeon: Serafina Mitchell, MD;  Location: [facility];  Service: Cardiovascular;  Laterality: Left;   THORACIC AORTIC ENDOVASCULAR STENT GRAFT N/A 09/09/2019   Procedure: THORACIC AORTIC ENDOVASCULAR STENT GRAFT WITH LEFT ILIAC CONDUIT;  Surgeon: Serafina Mitchell, MD;  Location: [facility];  Service: Vascular;  Laterality: N/A;   TUBAL LIGATION     ULTRASOUND GUIDANCE FOR VASCULAR ACCESS Left 09/09/2019   Procedure: ULTRASOUND GUIDANCE FOR VASCULAR ACCESS;  Surgeon: Serafina Mitchell, MD;  Location: [facility];  Service: Vascular;  Laterality: Left;   VAGINAL HYSTERECTOMY     Family History Family History  Problem Relation Age of Onset   Prostate cancer Father    Hypertension Mother    Heart attack Mother    Heart disease Mother    Kidney disease Mother 60   Stomach cancer Son    Diabetes Maternal Aunt    CAD Maternal Aunt    Stroke Maternal Aunt    Heart disease Maternal Uncle    CAD Maternal Uncle    Stroke Maternal Uncle    Colon cancer Neg Hx    Esophageal cancer Neg Hx    Rectal cancer Neg Hx     Social History Social History   Tobacco Use   Smoking status: Every Day    Packs/day: 1.00    Years: 50.00    Total pack years: 50.00    Types: Cigarettes    Last attempt to quit: 11/20/2020    Years since quitting: 1.0    Passive exposure: Never   Smokeless tobacco: Never   Tobacco comments:    7 cigarettes daily  Vaping Use   Vaping Use: Never used  Substance Use  Topics   Alcohol use: No   Drug use: No   Allergies Amlodipine, Lisinopril, Losartan, Codeine phosphate, Morphine and related, Naproxen, Nsaids, Penicillins, Propoxyphene n-acetaminophen, Sulfamethoxazole-trimethoprim, and Flexeril [cyclobenzaprine]  Review of Systems Review of Systems  Cardiovascular:  Positive for leg swelling.    Physical Exam Vital Signs  I have reviewed the triage vital signs BP (!) 150/77   Pulse 77   Temp 98.1 F (36.7 C) (Oral)   Resp 20   Ht '5\' 5"'$  (1.651 m)   Wt 82.2 kg   SpO2 99%   BMI 30.16 kg/m   Physical Exam Vitals and nursing note reviewed.  Constitutional:      General: She is not in acute distress.    Appearance: She is well-developed.  HENT:     Head: Normocephalic and atraumatic.  Eyes:     Conjunctiva/sclera: Conjunctivae normal.  Cardiovascular:     Rate and Rhythm: Normal rate and regular rhythm.     Heart sounds: No murmur heard. Pulmonary:     Effort: Pulmonary effort is normal. No respiratory distress.     Breath sounds: Normal breath sounds.  Abdominal:     Palpations: Abdomen is soft.     Tenderness: There is no abdominal tenderness.  Musculoskeletal:        General: Tenderness present. No swelling.     Cervical back: Neck supple.     Right lower leg: Edema present.     Left lower leg: Edema present.  Skin:    General: Skin is warm and dry.     Capillary Refill: Capillary refill takes less than 2 seconds.  Neurological:  Mental Status: She is alert.  Psychiatric:        Mood and Affect: Mood normal.     ED Results and Treatments Labs (all labs ordered are listed, but only abnormal results are displayed) Labs Reviewed  COMPREHENSIVE METABOLIC PANEL - Abnormal; Notable for the following components:      Result Value   BUN 27 (*)    Creatinine, Ser 1.65 (*)    GFR, Estimated 33 (*)    All other components within normal limits  CBC WITH DIFFERENTIAL/PLATELET - Abnormal; Notable for the following  components:   RDW 17.8 (*)    All other components within normal limits  BRAIN NATRIURETIC PEPTIDE - Abnormal; Notable for the following components:   B Natriuretic Peptide 353.0 (*)    All other components within normal limits                                                                                                                          Radiology No results found.  Pertinent labs & imaging results that were available during my care of the patient were reviewed by me and considered in my medical decision making (see MDM for details).  Medications Ordered in ED Medications  carvedilol (COREG) tablet 25 mg (25 mg Oral Given 12/12/21 2011)  cloNIDine (CATAPRES) tablet 0.2 mg (0.2 mg Oral Given 12/12/21 2012)  hydrALAZINE (APRESOLINE) tablet 100 mg (100 mg Oral Given 12/12/21 2011)  apixaban (ELIQUIS) tablet 10 mg (10 mg Oral Given 12/12/21 2012)                                                                                                                                     Procedures Procedures  (including critical care time)  Medical Decision Making / ED Course   This patient presents to the ED for concern of leg swelling, this involves an extensive number of treatment options, and is a complaint that carries with it a high risk of complications and morbidity.  The differential diagnosis includes DVT, gravity dependent edema, CHF, cellulitis  MDM: Patient seen in the emergency department for evaluation of lower extremity edema.  Physical exam with bilateral lower extremity edema but right significantly greater than left.  Tenderness to palpation in the calf.  Laboratory evaluation with a creatinine of 1.65, BUN 27, BNP elevated to 353.  We do not have DVT ultrasound at night  or on Thanksgiving holiday and thus I consulted the patient's vascular surgeon Dr. Trula Slade to inquire about the safety of initiating Eliquis therapy tonight and having the patient come back tomorrow  morning in the setting of her aneurysm repair.  Dr. Trula Slade states that the patient is safe for DOAC initiation and if the patient does have a DVT, she would be a good candidate for the DVT clinic, and they are unfortunately not open tomorrow.  Thus, we started the patient on Eliquis single dose today and she will return first thing tomorrow morning for DVT ultrasound.  If positive, the vascular surgeons should be consulted to help establish DVT clot clinic follow-up for this patient given her complex vascular history.  Home blood pressure medications were given and hypertension improved.  Patient then discharged.   Additional history obtained:  -External records from outside source obtained and reviewed including: Chart review including previous notes, labs, imaging, consultation notes   Lab Tests: -I ordered, reviewed, and interpreted labs.   The pertinent results include:   Labs Reviewed  COMPREHENSIVE METABOLIC PANEL - Abnormal; Notable for the following components:      Result Value   BUN 27 (*)    Creatinine, Ser 1.65 (*)    GFR, Estimated 33 (*)    All other components within normal limits  CBC WITH DIFFERENTIAL/PLATELET - Abnormal; Notable for the following components:   RDW 17.8 (*)    All other components within normal limits  BRAIN NATRIURETIC PEPTIDE - Abnormal; Notable for the following components:   B Natriuretic Peptide 353.0 (*)    All other components within normal limits        Medicines ordered and prescription drug management: Meds ordered this encounter  Medications   carvedilol (COREG) tablet 25 mg   cloNIDine (CATAPRES) tablet 0.2 mg   hydrALAZINE (APRESOLINE) tablet 100 mg   apixaban (ELIQUIS) tablet 10 mg    -I have reviewed the patients home medicines and have made adjustments as needed  Critical interventions none  Consultations Obtained: I requested consultation with the vascular surgeon Dr. Trula Slade,  and discussed lab and imaging findings as  well as pertinent plan - they recommend: Okay to initiate Eliquis   Cardiac Monitoring: The patient was maintained on a cardiac monitor.  I personally viewed and interpreted the cardiac monitored which showed an underlying rhythm of: NSR  Social Determinants of Health:  Factors impacting patients care include: none   Reevaluation: After the interventions noted above, I reevaluated the patient and found that they have :improved  Co morbidities that complicate the patient evaluation  Past Medical History:  Diagnosis Date   Anemia    Anxiety    Arthritis    Atrial fib/flutter, transient    Cataract    COPD (chronic obstructive pulmonary disease) (HCC)    Depression    Diabetes mellitus    Diverticulosis    DJD (degenerative joint disease)    GERD (gastroesophageal reflux disease)    Gout    History of pneumonia    Hypertension    Hypothyroidism    Internal hemorrhoids       Dispostion: I considered admission for this patient, and patient will follow-up tomorrow morning for DVT ultrasound     Final Clinical Impression(s) / ED Diagnoses Final diagnoses:  Leg swelling     '@PCDICTATION'$ @    Noraa Pickeral, Debe Coder, MD 12/13/21 0144

## 2021-12-16 ENCOUNTER — Telehealth (HOSPITAL_COMMUNITY): Payer: Self-pay

## 2021-12-16 NOTE — Telephone Encounter (Signed)
Attempted to call, no answer.

## 2021-12-17 ENCOUNTER — Ambulatory Visit (HOSPITAL_COMMUNITY)
Admission: RE | Admit: 2021-12-17 | Discharge: 2021-12-17 | Disposition: A | Discharge status: Home / Self Care | Payer: Medicare Other | Source: Ambulatory Visit | Attending: Student | Admitting: Student

## 2021-12-17 DIAGNOSIS — M7121 Synovial cyst of popliteal space [Baker], right knee: Secondary | ICD-10-CM | POA: Diagnosis not present

## 2021-12-17 DIAGNOSIS — M7989 Other specified soft tissue disorders: Secondary | ICD-10-CM | POA: Insufficient documentation

## 2022-02-05 ENCOUNTER — Ambulatory Visit: Payer: 59 | Attending: Internal Medicine | Admitting: Internal Medicine

## 2022-02-05 ENCOUNTER — Encounter: Payer: Self-pay | Admitting: Internal Medicine

## 2022-02-05 VITALS — BP 150/100 | HR 65 | Ht 65.0 in | Wt 188.7 lb

## 2022-02-05 DIAGNOSIS — I482 Chronic atrial fibrillation, unspecified: Secondary | ICD-10-CM

## 2022-02-05 DIAGNOSIS — Z950 Presence of cardiac pacemaker: Secondary | ICD-10-CM

## 2022-02-05 LAB — CUP PACEART INCLINIC DEVICE CHECK
Battery Impedance: 4634 Ohm
Battery Remaining Longevity: 10 mo
Battery Voltage: 2.68 V
Brady Statistic AP VP Percent: 5 %
Brady Statistic AP VS Percent: 54 %
Brady Statistic AS VP Percent: 6 %
Brady Statistic AS VS Percent: 35 %
Date Time Interrogation Session: 20240117162004
Implantable Lead Connection Status: 753985
Implantable Lead Connection Status: 753985
Implantable Lead Implant Date: 19970929
Implantable Lead Implant Date: 19970929
Implantable Lead Location: 753859
Implantable Lead Location: 753860
Implantable Lead Model: 4068
Implantable Lead Model: 5092
Implantable Pulse Generator Implant Date: 20090505
Lead Channel Impedance Value: 610 Ohm
Lead Channel Impedance Value: 619 Ohm
Lead Channel Pacing Threshold Amplitude: 1 V
Lead Channel Pacing Threshold Amplitude: 1 V
Lead Channel Pacing Threshold Pulse Width: 0.4 ms
Lead Channel Pacing Threshold Pulse Width: 0.4 ms
Lead Channel Sensing Intrinsic Amplitude: 0.5 mV
Lead Channel Sensing Intrinsic Amplitude: 11.2 mV
Lead Channel Setting Pacing Amplitude: 2.5 V
Lead Channel Setting Pacing Amplitude: 2.5 V
Lead Channel Setting Pacing Pulse Width: 0.4 ms
Lead Channel Setting Sensing Sensitivity: 5.6 mV
Zone Setting Status: 755011
Zone Setting Status: 755011

## 2022-02-05 NOTE — Patient Instructions (Signed)
Medication Instructions:  Your physician recommends that you continue on your current medications as directed. Please refer to the Current Medication list given to you today.  *If you need a refill on your cardiac medications before your next appointment, please call your pharmacy*   Lab Work: NONE   If you have labs (blood work) drawn today and your tests are completely normal, you will receive your results only by: [facility] (if you have MyChart) OR A paper copy in the mail If you have any lab test that is abnormal or we need to change your treatment, we will call you to review the results.   Testing/Procedures: NONE    Follow-Up: At [facility], you and your health needs are our priority.  As part of our continuing mission to provide you with exceptional heart care, we have created designated Provider Care Teams.  These Care Teams include your primary Cardiologist (physician) and Advanced Practice Providers (APPs -  Physician Assistants and Nurse Practitioners) who all work together to provide you with the care you need, when you need it.  We recommend signing up for the patient portal called "MyChart".  Sign up information is provided on this After Visit Summary.  MyChart is used to connect with patients for Virtual Visits (Telemedicine).  Patients are able to view lab/test results, encounter notes, upcoming appointments, etc.  Non-urgent messages can be sent to your provider as well.   To learn more about what you can do with MyChart, go to [email or web address].    Your next appointment:   March 2025   Provider:   Cristopher Peru, MD    Other Instructions Thank you for choosing [facility]!

## 2022-02-05 NOTE — Progress Notes (Signed)
HPI [name] returns for followup of sinus node dysfunction s/p PPM insertion. She is a pleasant 72 yo woman with a h/o peripheral vascular disease, HTN, sinus node dysfunction, s/p PPM insertion. She has been treated for an intramural hematoma with stenting by Dr. Trula Slade. At discharge her bp meds were reduced. Since she has been home, she has felt ok but notes that her bp is not well controlled. No edema, chest pain or syncope. She is now on both plavix and ASA. She notes that at home her bp is normal.  Allergies  Allergen Reactions   Amlodipine Swelling    Started very close to episode of angioedema to ACE, unknown if this added effects or just residual   Lisinopril     angioedema   Losartan Swelling    Lip swelling    Codeine Phosphate Other (See Comments)    REACTION: UNKNOWN   Morphine And Related Other (See Comments)    Cannot take:Heart problems   Naproxen Nausea And Vomiting   Nsaids Nausea Only   Penicillins Other (See Comments)    UNKNOWN REACTION   Propoxyphene N-Acetaminophen Other (See Comments)    Makes me gag   Sulfamethoxazole-Trimethoprim Other (See Comments)    Gi upset   Flexeril [Cyclobenzaprine] Anxiety and Other (See Comments)    Pt states medication gives her nightmares and insomnia.     Current Outpatient Medications  Medication Sig Dispense Refill   aspirin 81 MG tablet Take 81 mg by mouth every morning.      carvedilol (COREG) 25 MG tablet Take 1 tablet (25 mg total) by mouth 2 (two) times daily with a meal. 180 tablet 3   cholecalciferol (VITAMIN D) 1000 UNITS tablet Take 1,000 Units by mouth every morning.      cloNIDine (CATAPRES) 0.2 MG tablet Take 1 tablet (0.2 mg total) by mouth 3 (three) times daily. 90 tablet 2   clopidogrel (PLAVIX) 75 MG tablet Take 75 mg by mouth daily.     famotidine (PEPCID) 20 MG tablet Take 20 mg by mouth daily.     gabapentin (NEURONTIN) 100 MG capsule Take 1 capsule (100 mg total) by mouth 3 (three) times  daily. 90 capsule 2   glimepiride (AMARYL) 1 MG tablet Take 1 tablet (1 mg total) by mouth every morning. 30 tablet 3   hydrALAZINE (APRESOLINE) 100 MG tablet Take 100 mg by mouth 3 (three) times daily.     hydrochlorothiazide (HYDRODIURIL) 12.5 MG tablet Take 12.5 mg by mouth daily.     hydrOXYzine (ATARAX) 50 MG tablet Take 50 mg by mouth 3 (three) times daily.     levothyroxine (SYNTHROID) 25 MCG tablet Take 1 tablet (25 mcg total) by mouth daily. 30 tablet 2   lovastatin (MEVACOR) 20 MG tablet Take 20 mg by mouth at bedtime.     Multiple Vitamin (MULTIVITAMIN) tablet Take 1 tablet by mouth every morning.      nicotine (NICODERM CQ - DOSED IN MG/24 HOURS) 14 mg/24hr patch Place 1 patch (14 mg total) onto the skin daily. 28 patch 11   nicotine polacrilex (NICORETTE MINI) 2 MG lozenge Take 1 lozenge (2 mg total) by mouth as needed for smoking cessation. 100 tablet 3   Oxycodone HCl 10 MG TABS Take 10 mg by mouth 4 (four) times daily as needed (pain).     OXYGEN Inhale 1 L into the lungs at bedtime.     pantoprazole (PROTONIX) 40 MG tablet Take 1 tablet (  40 mg total) by mouth daily. 30 tablet 1   polyethylene glycol (MIRALAX / GLYCOLAX) packet Take 17 g by mouth daily.     rosuvastatin (CRESTOR) 20 MG tablet Take 1 tablet (20 mg total) by mouth daily. 30 tablet 2   sertraline (ZOLOFT) 100 MG tablet Take 100 mg by mouth at bedtime.     Tiotropium Bromide-Olodaterol (STIOLTO RESPIMAT) 2.5-2.5 MCG/ACT AERS Inhale 2 puffs into the lungs daily. 4 g 5   triamcinolone cream (KENALOG) 0.1 % Apply topically 2 (two) times daily as needed (eczema).     No current facility-administered medications for this visit.     Past Medical History:  Diagnosis Date   Anemia    Anxiety    Arthritis    Atrial fib/flutter, transient    Cataract    COPD (chronic obstructive pulmonary disease) (HCC)    Depression    Diabetes mellitus    Diverticulosis    DJD (degenerative joint disease)    GERD  (gastroesophageal reflux disease)    Gout    History of pneumonia    Hypertension    Hypothyroidism    Internal hemorrhoids     ROS:   All systems reviewed and negative except as noted in the HPI.   Past Surgical History:  Procedure Laterality Date   ABDOMINAL AORTOGRAM W/LOWER EXTREMITY N/A 07/24/2020   Procedure: ABDOMINAL AORTOGRAM W/LOWER EXTREMITY;  Surgeon: Serafina Mitchell, MD;  Location: [facility];  Service: Cardiovascular;  Laterality: N/A;   CAROTID STENT  05/2021   CATARACT EXTRACTION W/ INTRAOCULAR LENS IMPLANT Left    ENDARTERECTOMY Left 09/09/2019   Procedure: LEFT ILIAC ENDARTERECTOMY;  Surgeon: Serafina Mitchell, MD;  Location: [facility];  Service: Vascular;  Laterality: Left;   HERNIA REPAIR     INSERTION OF ILIAC STENT Left 09/09/2019   Procedure: INSERTION OF LEFT ILIAC STENT;  Surgeon: Serafina Mitchell, MD;  Location: [facility];  Service: Vascular;  Laterality: Left;   PACEMAKER INSERTION     PERIPHERAL VASCULAR INTERVENTION Left 07/24/2020   Procedure: PERIPHERAL VASCULAR INTERVENTION;  Surgeon: Serafina Mitchell, MD;  Location: [facility];  Service: Cardiovascular;  Laterality: Left;   THORACIC AORTIC ENDOVASCULAR STENT GRAFT N/A 09/09/2019   Procedure: THORACIC AORTIC ENDOVASCULAR STENT GRAFT WITH LEFT ILIAC CONDUIT;  Surgeon: Serafina Mitchell, MD;  Location: [facility];  Service: Vascular;  Laterality: N/A;   TUBAL LIGATION     ULTRASOUND GUIDANCE FOR VASCULAR ACCESS Left 09/09/2019   Procedure: ULTRASOUND GUIDANCE FOR VASCULAR ACCESS;  Surgeon: Serafina Mitchell, MD;  Location: [facility];  Service: Vascular;  Laterality: Left;   VAGINAL HYSTERECTOMY       Family History  Problem Relation Age of Onset   Prostate cancer Father    Hypertension Mother    Heart attack Mother    Heart disease Mother    Kidney disease Mother 62   Stomach cancer Son    Diabetes Maternal Aunt    CAD Maternal Aunt    Stroke Maternal Aunt    Heart disease Maternal Uncle     CAD Maternal Uncle    Stroke Maternal Uncle    Colon cancer Neg Hx    Esophageal cancer Neg Hx    Rectal cancer Neg Hx      Social History   Socioeconomic History   Marital status: Widowed    Spouse name: Not on file   Number of children: 2   Years of education: Not on file  Highest education level: Not on file  Occupational History   Occupation: Disabled    Employer: UNEMPLOYED  Tobacco Use   Smoking status: Every Day    Packs/day: 1.00    Years: 50.00    Total pack years: 50.00    Types: Cigarettes    Last attempt to quit: 11/20/2020    Years since quitting: 1.2    Passive exposure: Never   Smokeless tobacco: Never   Tobacco comments:    7 cigarettes daily  Vaping Use   Vaping Use: Never used  Substance and Sexual Activity   Alcohol use: No   Drug use: No   Sexual activity: Yes    Birth control/protection: Surgical  Other Topics Concern   Not on file  Social History Narrative   Right handed    Has a son   Hostess at hotel    Social Determinants of Health   Financial Resource Strain: Medium Risk (11/28/2019)   Overall Financial Resource Strain (CARDIA)    Difficulty of Paying Living Expenses: Somewhat hard  Food Insecurity: No Food Insecurity (11/28/2019)   Hunger Vital Sign    Worried About Running Out of Food in the Last Year: Never true    Ran Out of Food in the Last Year: Never true  Transportation Needs: No Transportation Needs (11/28/2019)   PRAPARE - Hydrologist (Medical): No    Lack of Transportation (Non-Medical): No  Physical Activity: Unknown (11/28/2019)   Exercise Vital Sign    Days of Exercise per Week: Not on file    Minutes of Exercise per Session: 20 min  Stress: Stress Concern Present (11/28/2019)   [place]    Feeling of Stress : Rather much  Social Connections: Not on file  Intimate Partner Violence: Not on file     Ht '5\' 5"'$  (1.651 m)    Wt 188 lb 11.2 oz (85.6 kg)   BMI 31.40 kg/m   Physical Exam:  Well appearing NAD HEENT: Unremarkable Neck:  No JVD, no thyromegally Lymphatics:  No adenopathy Back:  No CVA tenderness Lungs:  Clear with no wheezes HEART:  Regular rate rhythm, no murmurs, no rubs, no clicks Abd:  soft, positive bowel sounds, no organomegally, no rebound, no guarding Ext:  2 plus pulses, no edema, no cyanosis, no clubbing Skin:  No rashes no nodules Neuro:  CN II through XII intact, motor grossly intact  EKG - atrial pacing  DEVICE  Normal device function.  See PaceArt for details.   Assess/Plan:  sinus node dysfunction - she is asymptomatic s/p PPM insertion. HTN - she notes that at home her bp is much better controlled. I asked her to avoid salty foods. She has had some  sciatic pain as well. PAF - she has had less than 5 minutes of atrial fib at time. I suspect that she will have more and end up needing an [place].  PPM - her Medtronic DDD PM is working normally. We will recheck in several months. She is less than a year from ERI. I increased her AV delay to allow for intrinsic AV conduction.  Carleene Overlie Rykker Coviello,MD

## 2022-03-05 ENCOUNTER — Other Ambulatory Visit: Payer: Self-pay | Admitting: Internal Medicine

## 2022-03-05 DIAGNOSIS — Z1231 Encounter for screening mammogram for malignant neoplasm of breast: Secondary | ICD-10-CM

## 2022-03-10 ENCOUNTER — Telehealth: Payer: Self-pay

## 2022-03-10 ENCOUNTER — Ambulatory Visit: Payer: 59

## 2022-03-10 DIAGNOSIS — I482 Chronic atrial fibrillation, unspecified: Secondary | ICD-10-CM

## 2022-03-10 LAB — CUP PACEART REMOTE DEVICE CHECK
Battery Impedance: 5064 Ohm
Battery Remaining Longevity: 6 mo
Battery Voltage: 2.66 V
Brady Statistic AP VP Percent: 1 %
Brady Statistic AP VS Percent: 89 %
Brady Statistic AS VP Percent: 1 %
Brady Statistic AS VS Percent: 9 %
Date Time Interrogation Session: 20240219112649
Implantable Lead Connection Status: 753985
Implantable Lead Connection Status: 753985
Implantable Lead Implant Date: 19970929
Implantable Lead Implant Date: 19970929
Implantable Lead Location: 753859
Implantable Lead Location: 753860
Implantable Lead Model: 4068
Implantable Lead Model: 5092
Implantable Pulse Generator Implant Date: 20090505
Lead Channel Impedance Value: 594 Ohm
Lead Channel Impedance Value: 657 Ohm
Lead Channel Setting Pacing Amplitude: 2.5 V
Lead Channel Setting Pacing Amplitude: 2.5 V
Lead Channel Setting Pacing Pulse Width: 0.4 ms
Lead Channel Setting Sensing Sensitivity: 4 mV
Zone Setting Status: 755011
Zone Setting Status: 755011

## 2022-03-10 NOTE — Telephone Encounter (Signed)
Scheduled remote reviewed. Normal device function.   1 AHR, 3sec in duration, likely noise Battery estimated 69mo- route to triage Next remote to be determined LA  Routing to [facility] device team for assistance.  Please make patient aware and set up on monthly checks.

## 2022-03-11 NOTE — Telephone Encounter (Signed)
I left a detail message for the patient and asked her to give [name] a call back.

## 2022-03-11 NOTE — Telephone Encounter (Signed)
The patient returned my call. She wants to know why all of a sudden she has 6 months left on the battery? I explained to her that the reports gives [place] an estimation and it can be between 1-25 months for her to reached ERI. She wants to know if she using the device more? I told her the nurse will give her a call back. She states if she do not answer to tell the nurse to leave a detail message on her cell phone.

## 2022-03-12 NOTE — Telephone Encounter (Signed)
Returned call to Pt.  Advised the time left on her pacemaker can vary, and we are switching to monthly checks out of an abundance of caution.  She indicates understanding and was reassured.  She will call with any other questions/concerns.

## 2022-04-03 ENCOUNTER — Ambulatory Visit (INDEPENDENT_AMBULATORY_CARE_PROVIDER_SITE_OTHER): Payer: 59 | Admitting: Neurology

## 2022-04-03 DIAGNOSIS — G5603 Carpal tunnel syndrome, bilateral upper limbs: Secondary | ICD-10-CM

## 2022-04-03 DIAGNOSIS — R200 Anesthesia of skin: Secondary | ICD-10-CM

## 2022-04-03 NOTE — Procedures (Signed)
[facility]  [place], [place] [address]  [place], [place] [address] Tel: [phone] Fax: [phone] Test Date:  04/03/2022  Patient: [name] DOB: May 31, 1950 Physician: Narda Amber, DO  Sex: Female Height: '5\' 5"'$  Ref Phys: Alonza Bogus, DO  ID#: [id number]   Technician:    History: This is a 72 year old female referred for evaluation of bilateral hand paresthesias.  NCV & EMG Findings: Extensive electrodiagnostic testing of the right upper extremity and additional studies of the left shows:  Bilateral median sensory responses show prolonged latency (R4.5, L5.6 ms) and reduced amplitude (R7.1, L4.1 V).  Bilateral ulnar sensory responses are within normal limits. Bilateral median motor responses show prolonged latency (R4.7, L4.5 ms).  Bilateral ulnar motor responses are within normal limits. Chronic motor axon loss changes are seen affecting bilateral abductor pollicis brevis muscles.  Additionally, in the right upper extremity, chronic motor axon loss changes are seen affecting the C8 myotome.  Impression: Bilateral median neuropathy at or distal to the wrist, consistent with a clinical diagnosis of carpal tunnel syndrome.  Overall, these findings are moderate in degree electrically. Chronic right C8 radiculopathy, mild. There is no evidence of a large fiber sensorimotor polyneuropathy affecting the upper extremities.   ___________________________ Narda Amber, DO    Nerve Conduction Studies   Stim Site NR Peak (ms) Norm Peak (ms) O-P Amp (V) Norm O-P Amp  Left Median Anti Sensory (2nd Digit)  32 C  Wrist    *5.6 <3.8 *4.1 >10  Right Median Anti Sensory (2nd Digit)  32 C  Wrist    *4.5 <3.8 *7.1 >10  Left Ulnar Anti Sensory (5th Digit)  32 C  Wrist    3.0 <3.2 18.9 >5  Right Ulnar Anti Sensory (5th Digit)  32 C  Wrist    2.8 <3.2 18.6 >5     Stim Site NR Onset (ms) Norm Onset (ms) O-P Amp (mV) Norm O-P Amp Site1 Site2 Delta-0 (ms) Dist (cm) Vel  (m/s) Norm Vel (m/s)  Left Median Motor (Abd Poll Brev)  32 C  Wrist    *4.5 <4.0 6.0 >5 Elbow Wrist 5.4 28.0 52 >50  Elbow    9.9  5.6         Right Median Motor (Abd Poll Brev)  32 C  Wrist    *4.7 <4.0 5.1 >5 Elbow Wrist 5.6 29.0 52 >50  Elbow    10.3  5.0         Left Ulnar Motor (Abd Dig Minimi)  32 C  Wrist    2.3 <3.1 9.1 >7 B Elbow Wrist 3.3 20.0 61 >50  B Elbow    5.6  9.1  A Elbow B Elbow 1.9 10.0 53 >50  A Elbow    7.5  9.1         Right Ulnar Motor (Abd Dig Minimi)  32 C  Wrist    2.3 <3.1 10.3 >7 B Elbow Wrist 3.2 20.0 63 >50  B Elbow    5.5  9.6  A Elbow B Elbow 1.8 10.0 56 >50  A Elbow    7.3  9.5          Electromyography   Side Muscle Ins.Act Fibs Fasc Recrt Amp Dur Poly Activation Comment  Right 1stDorInt Nml Nml Nml *1- *1+ *1+ *1+ Nml N/A  Right Abd Poll Brev Nml Nml Nml *1- *1+ *1+ *1+ Nml N/A  Right PronatorTeres Nml Nml Nml Nml Nml Nml Nml Nml N/A  Right  Biceps Nml Nml Nml Nml Nml Nml Nml Nml N/A  Right Triceps Nml Nml Nml *1- *1+ *1+ *1+ Nml N/A  Right Deltoid Nml Nml Nml Nml Nml Nml Nml Nml N/A  Right Ext Indicis Nml Nml Nml *1- *1+ *1+ *1+ Nml N/A  Left Abd Poll Brev Nml Nml Nml *1- *1+ *1+ *1+ Nml N/A  Left 1stDorInt Nml Nml Nml Nml Nml Nml Nml Nml N/A  Left PronatorTeres Nml Nml Nml Nml Nml Nml Nml Nml N/A  Left Biceps Nml Nml Nml Nml Nml Nml Nml Nml N/A  Left Triceps Nml Nml Nml Nml Nml Nml Nml Nml N/A  Left Deltoid Nml Nml Nml Nml Nml Nml Nml Nml N/A      Waveforms:

## 2022-04-11 ENCOUNTER — Ambulatory Visit: Payer: 59 | Attending: Internal Medicine

## 2022-04-11 ENCOUNTER — Other Ambulatory Visit: Payer: Self-pay | Admitting: Pulmonary Disease

## 2022-04-11 DIAGNOSIS — I480 Paroxysmal atrial fibrillation: Secondary | ICD-10-CM

## 2022-04-11 LAB — CUP PACEART REMOTE DEVICE CHECK
Battery Impedance: 5184 Ohm
Battery Remaining Longevity: 5 mo
Battery Voltage: 2.68 V
Brady Statistic AP VP Percent: 1 %
Brady Statistic AP VS Percent: 90 %
Brady Statistic AS VP Percent: 1 %
Brady Statistic AS VS Percent: 9 %
Date Time Interrogation Session: 20240322122434
Implantable Lead Connection Status: 753985
Implantable Lead Connection Status: 753985
Implantable Lead Implant Date: 19970929
Implantable Lead Implant Date: 19970929
Implantable Lead Location: 753859
Implantable Lead Location: 753860
Implantable Lead Model: 4068
Implantable Lead Model: 5092
Implantable Pulse Generator Implant Date: 20090505
Lead Channel Impedance Value: 625 Ohm
Lead Channel Impedance Value: 659 Ohm
Lead Channel Setting Pacing Amplitude: 2.5 V
Lead Channel Setting Pacing Amplitude: 2.5 V
Lead Channel Setting Pacing Pulse Width: 0.4 ms
Lead Channel Setting Sensing Sensitivity: 5.6 mV
Zone Setting Status: 755011
Zone Setting Status: 755011

## 2022-04-22 ENCOUNTER — Ambulatory Visit
Admission: RE | Admit: 2022-04-22 | Discharge: 2022-04-22 | Disposition: A | Discharge status: Home / Self Care | Payer: 59 | Source: Ambulatory Visit | Attending: Internal Medicine | Admitting: Internal Medicine

## 2022-04-22 DIAGNOSIS — Z1231 Encounter for screening mammogram for malignant neoplasm of breast: Secondary | ICD-10-CM

## 2022-04-23 NOTE — Progress Notes (Signed)
Remote pacemaker transmission.   

## 2022-05-12 ENCOUNTER — Ambulatory Visit (INDEPENDENT_AMBULATORY_CARE_PROVIDER_SITE_OTHER): Payer: 59

## 2022-05-12 DIAGNOSIS — I480 Paroxysmal atrial fibrillation: Secondary | ICD-10-CM

## 2022-05-13 LAB — CUP PACEART REMOTE DEVICE CHECK
Battery Impedance: 5291 Ohm
Battery Remaining Longevity: 5 mo
Battery Voltage: 2.65 V
Brady Statistic AP VP Percent: 1 %
Brady Statistic AP VS Percent: 90 %
Brady Statistic AS VP Percent: 1 %
Brady Statistic AS VS Percent: 9 %
Date Time Interrogation Session: 20240422123741
Implantable Lead Connection Status: 753985
Implantable Lead Connection Status: 753985
Implantable Lead Implant Date: 19970929
Implantable Lead Implant Date: 19970929
Implantable Lead Location: 753859
Implantable Lead Location: 753860
Implantable Lead Model: 4068
Implantable Lead Model: 5092
Implantable Pulse Generator Implant Date: 20090505
Lead Channel Impedance Value: 631 Ohm
Lead Channel Impedance Value: 654 Ohm
Lead Channel Setting Pacing Amplitude: 2.5 V
Lead Channel Setting Pacing Amplitude: 2.5 V
Lead Channel Setting Pacing Pulse Width: 0.4 ms
Lead Channel Setting Sensing Sensitivity: 5.6 mV
Zone Setting Status: 755011
Zone Setting Status: 755011

## 2022-05-13 NOTE — Progress Notes (Signed)
Remote pacemaker transmission.   

## 2022-06-04 ENCOUNTER — Other Ambulatory Visit: Payer: Self-pay

## 2022-06-04 DIAGNOSIS — I712 Thoracic aortic aneurysm, without rupture, unspecified: Secondary | ICD-10-CM

## 2022-06-12 ENCOUNTER — Ambulatory Visit (INDEPENDENT_AMBULATORY_CARE_PROVIDER_SITE_OTHER): Payer: 59

## 2022-06-12 DIAGNOSIS — I480 Paroxysmal atrial fibrillation: Secondary | ICD-10-CM | POA: Diagnosis not present

## 2022-06-12 LAB — CUP PACEART REMOTE DEVICE CHECK
Battery Impedance: 6069 Ohm
Battery Remaining Longevity: 1 mo — CL
Battery Voltage: 2.64 V
Brady Statistic AP VP Percent: 1 %
Brady Statistic AP VS Percent: 90 %
Brady Statistic AS VP Percent: 1 %
Brady Statistic AS VS Percent: 8 %
Date Time Interrogation Session: 20240523095115
Implantable Lead Connection Status: 753985
Implantable Lead Connection Status: 753985
Implantable Lead Implant Date: 19970929
Implantable Lead Implant Date: 19970929
Implantable Lead Location: 753859
Implantable Lead Location: 753860
Implantable Lead Model: 4068
Implantable Lead Model: 5092
Implantable Pulse Generator Implant Date: 20090505
Lead Channel Impedance Value: 624 Ohm
Lead Channel Impedance Value: 672 Ohm
Lead Channel Setting Pacing Amplitude: 2.5 V
Lead Channel Setting Pacing Amplitude: 2.5 V
Lead Channel Setting Pacing Pulse Width: 0.4 ms
Lead Channel Setting Sensing Sensitivity: 5.6 mV
Zone Setting Status: 755011
Zone Setting Status: 755011

## 2022-06-17 NOTE — Progress Notes (Signed)
Remote pacemaker transmission.   

## 2022-06-17 NOTE — Addendum Note (Signed)
Addended by: Geralyn Flash D on: 06/17/2022 12:45 PM   Modules accepted: Level of Service

## 2022-06-22 ENCOUNTER — Emergency Department (HOSPITAL_COMMUNITY): Payer: 59

## 2022-06-22 ENCOUNTER — Inpatient Hospital Stay (HOSPITAL_COMMUNITY)
Admission: EM | Admit: 2022-06-22 | Discharge: 2022-06-25 | DRG: 304 | Disposition: A | Discharge status: Home / Self Care | Payer: 59 | Attending: Internal Medicine | Admitting: Internal Medicine

## 2022-06-22 ENCOUNTER — Other Ambulatory Visit: Payer: Self-pay

## 2022-06-22 DIAGNOSIS — J9621 Acute and chronic respiratory failure with hypoxia: Secondary | ICD-10-CM | POA: Diagnosis present

## 2022-06-22 DIAGNOSIS — Z950 Presence of cardiac pacemaker: Secondary | ICD-10-CM

## 2022-06-22 DIAGNOSIS — I5043 Acute on chronic combined systolic (congestive) and diastolic (congestive) heart failure: Secondary | ICD-10-CM | POA: Diagnosis present

## 2022-06-22 DIAGNOSIS — Z880 Allergy status to penicillin: Secondary | ICD-10-CM

## 2022-06-22 DIAGNOSIS — Z8249 Family history of ischemic heart disease and other diseases of the circulatory system: Secondary | ICD-10-CM

## 2022-06-22 DIAGNOSIS — Z8616 Personal history of COVID-19: Secondary | ICD-10-CM | POA: Diagnosis not present

## 2022-06-22 DIAGNOSIS — E669 Obesity, unspecified: Secondary | ICD-10-CM | POA: Diagnosis present

## 2022-06-22 DIAGNOSIS — N179 Acute kidney failure, unspecified: Secondary | ICD-10-CM

## 2022-06-22 DIAGNOSIS — Z885 Allergy status to narcotic agent status: Secondary | ICD-10-CM

## 2022-06-22 DIAGNOSIS — E119 Type 2 diabetes mellitus without complications: Secondary | ICD-10-CM | POA: Diagnosis present

## 2022-06-22 DIAGNOSIS — Z961 Presence of intraocular lens: Secondary | ICD-10-CM | POA: Diagnosis present

## 2022-06-22 DIAGNOSIS — Z7901 Long term (current) use of anticoagulants: Secondary | ICD-10-CM

## 2022-06-22 DIAGNOSIS — I4891 Unspecified atrial fibrillation: Secondary | ICD-10-CM | POA: Diagnosis present

## 2022-06-22 DIAGNOSIS — Z7902 Long term (current) use of antithrombotics/antiplatelets: Secondary | ICD-10-CM

## 2022-06-22 DIAGNOSIS — E1122 Type 2 diabetes mellitus with diabetic chronic kidney disease: Secondary | ICD-10-CM | POA: Diagnosis present

## 2022-06-22 DIAGNOSIS — G4733 Obstructive sleep apnea (adult) (pediatric): Secondary | ICD-10-CM | POA: Diagnosis present

## 2022-06-22 DIAGNOSIS — Z833 Family history of diabetes mellitus: Secondary | ICD-10-CM

## 2022-06-22 DIAGNOSIS — Z90710 Acquired absence of both cervix and uterus: Secondary | ICD-10-CM

## 2022-06-22 DIAGNOSIS — I2489 Other forms of acute ischemic heart disease: Secondary | ICD-10-CM | POA: Diagnosis present

## 2022-06-22 DIAGNOSIS — Z7982 Long term (current) use of aspirin: Secondary | ICD-10-CM

## 2022-06-22 DIAGNOSIS — F17210 Nicotine dependence, cigarettes, uncomplicated: Secondary | ICD-10-CM | POA: Diagnosis present

## 2022-06-22 DIAGNOSIS — Z8679 Personal history of other diseases of the circulatory system: Secondary | ICD-10-CM

## 2022-06-22 DIAGNOSIS — F419 Anxiety disorder, unspecified: Secondary | ICD-10-CM | POA: Diagnosis present

## 2022-06-22 DIAGNOSIS — M109 Gout, unspecified: Secondary | ICD-10-CM | POA: Diagnosis present

## 2022-06-22 DIAGNOSIS — G894 Chronic pain syndrome: Secondary | ICD-10-CM | POA: Diagnosis present

## 2022-06-22 DIAGNOSIS — Z23 Encounter for immunization: Secondary | ICD-10-CM

## 2022-06-22 DIAGNOSIS — Z886 Allergy status to analgesic agent status: Secondary | ICD-10-CM

## 2022-06-22 DIAGNOSIS — Z79899 Other long term (current) drug therapy: Secondary | ICD-10-CM

## 2022-06-22 DIAGNOSIS — R0603 Acute respiratory distress: Secondary | ICD-10-CM | POA: Diagnosis not present

## 2022-06-22 DIAGNOSIS — Z79890 Hormone replacement therapy (postmenopausal): Secondary | ICD-10-CM

## 2022-06-22 DIAGNOSIS — I130 Hypertensive heart and chronic kidney disease with heart failure and stage 1 through stage 4 chronic kidney disease, or unspecified chronic kidney disease: Secondary | ICD-10-CM | POA: Diagnosis present

## 2022-06-22 DIAGNOSIS — Z8701 Personal history of pneumonia (recurrent): Secondary | ICD-10-CM

## 2022-06-22 DIAGNOSIS — K219 Gastro-esophageal reflux disease without esophagitis: Secondary | ICD-10-CM | POA: Diagnosis present

## 2022-06-22 DIAGNOSIS — E039 Hypothyroidism, unspecified: Secondary | ICD-10-CM | POA: Diagnosis present

## 2022-06-22 DIAGNOSIS — N1832 Chronic kidney disease, stage 3b: Secondary | ICD-10-CM | POA: Diagnosis present

## 2022-06-22 DIAGNOSIS — I169 Hypertensive crisis, unspecified: Secondary | ICD-10-CM | POA: Diagnosis not present

## 2022-06-22 DIAGNOSIS — J441 Chronic obstructive pulmonary disease with (acute) exacerbation: Secondary | ICD-10-CM | POA: Diagnosis present

## 2022-06-22 DIAGNOSIS — F32A Depression, unspecified: Secondary | ICD-10-CM | POA: Diagnosis present

## 2022-06-22 DIAGNOSIS — Z882 Allergy status to sulfonamides status: Secondary | ICD-10-CM

## 2022-06-22 DIAGNOSIS — R0600 Dyspnea, unspecified: Principal | ICD-10-CM

## 2022-06-22 DIAGNOSIS — Z7984 Long term (current) use of oral hypoglycemic drugs: Secondary | ICD-10-CM

## 2022-06-22 DIAGNOSIS — Z86718 Personal history of other venous thrombosis and embolism: Secondary | ICD-10-CM

## 2022-06-22 DIAGNOSIS — Z841 Family history of disorders of kidney and ureter: Secondary | ICD-10-CM

## 2022-06-22 DIAGNOSIS — E876 Hypokalemia: Secondary | ICD-10-CM | POA: Diagnosis present

## 2022-06-22 DIAGNOSIS — Z888 Allergy status to other drugs, medicaments and biological substances status: Secondary | ICD-10-CM

## 2022-06-22 DIAGNOSIS — Z6828 Body mass index (BMI) 28.0-28.9, adult: Secondary | ICD-10-CM

## 2022-06-22 DIAGNOSIS — Z9842 Cataract extraction status, left eye: Secondary | ICD-10-CM

## 2022-06-22 DIAGNOSIS — Z9851 Tubal ligation status: Secondary | ICD-10-CM

## 2022-06-22 DIAGNOSIS — Z9981 Dependence on supplemental oxygen: Secondary | ICD-10-CM

## 2022-06-22 LAB — CBC WITH DIFFERENTIAL/PLATELET
Abs Immature Granulocytes: 0.04 10*3/uL (ref 0.00–0.07)
Basophils Absolute: 0 10*3/uL (ref 0.0–0.1)
Basophils Relative: 0 %
Eosinophils Absolute: 0.3 10*3/uL (ref 0.0–0.5)
Eosinophils Relative: 3 %
HCT: 40.6 % (ref 36.0–46.0)
Hemoglobin: 13.1 g/dL (ref 12.0–15.0)
Immature Granulocytes: 0 %
Lymphocytes Relative: 15 %
Lymphs Abs: 1.4 10*3/uL (ref 0.7–4.0)
MCH: 29.8 pg (ref 26.0–34.0)
MCHC: 32.3 g/dL (ref 30.0–36.0)
MCV: 92.3 fL (ref 80.0–100.0)
Monocytes Absolute: 0.7 10*3/uL (ref 0.1–1.0)
Monocytes Relative: 7 %
Neutro Abs: 7.1 10*3/uL (ref 1.7–7.7)
Neutrophils Relative %: 75 %
Platelets: 239 10*3/uL (ref 150–400)
RBC: 4.4 MIL/uL (ref 3.87–5.11)
RDW: 18.6 % — ABNORMAL HIGH (ref 11.5–15.5)
WBC: 9.6 10*3/uL (ref 4.0–10.5)
nRBC: 0 % (ref 0.0–0.2)

## 2022-06-22 LAB — CBC
HCT: 42.9 % (ref 36.0–46.0)
Hemoglobin: 13.9 g/dL (ref 12.0–15.0)
MCH: 30 pg (ref 26.0–34.0)
MCHC: 32.4 g/dL (ref 30.0–36.0)
MCV: 92.7 fL (ref 80.0–100.0)
Platelets: 254 10*3/uL (ref 150–400)
RBC: 4.63 MIL/uL (ref 3.87–5.11)
RDW: 18.5 % — ABNORMAL HIGH (ref 11.5–15.5)
WBC: 13.3 10*3/uL — ABNORMAL HIGH (ref 4.0–10.5)
nRBC: 0 % (ref 0.0–0.2)

## 2022-06-22 LAB — COMPREHENSIVE METABOLIC PANEL
ALT: 18 U/L (ref 0–44)
AST: 30 U/L (ref 15–41)
Albumin: 3.9 g/dL (ref 3.5–5.0)
Alkaline Phosphatase: 86 U/L (ref 38–126)
Anion gap: 8 (ref 5–15)
BUN: 28 mg/dL — ABNORMAL HIGH (ref 8–23)
CO2: 29 mmol/L (ref 22–32)
Calcium: 9 mg/dL (ref 8.9–10.3)
Chloride: 100 mmol/L (ref 98–111)
Creatinine, Ser: 1.79 mg/dL — ABNORMAL HIGH (ref 0.44–1.00)
GFR, Estimated: 30 mL/min — ABNORMAL LOW (ref >60)
Glucose, Bld: 157 mg/dL — ABNORMAL HIGH (ref 70–99)
Potassium: 3.9 mmol/L (ref 3.5–5.1)
Sodium: 137 mmol/L (ref 135–145)
Total Bilirubin: 0.7 mg/dL (ref 0.3–1.2)
Total Protein: 7.9 g/dL (ref 6.5–8.1)

## 2022-06-22 LAB — BRAIN NATRIURETIC PEPTIDE: B Natriuretic Peptide: 288 pg/mL — ABNORMAL HIGH (ref 0.0–100.0)

## 2022-06-22 LAB — BLOOD GAS, VENOUS
Acid-Base Excess: 7.4 mmol/L — ABNORMAL HIGH (ref 0.0–2.0)
Bicarbonate: 35 mmol/L — ABNORMAL HIGH (ref 20.0–28.0)
Drawn by: 51244
O2 Saturation: 69.3 %
Patient temperature: 36
pCO2, Ven: 59 mmHg (ref 44–60)
pH, Ven: 7.37 (ref 7.25–7.43)
pO2, Ven: 40 mmHg (ref 32–45)

## 2022-06-22 LAB — CBG MONITORING, ED: Glucose-Capillary: 155 mg/dL — ABNORMAL HIGH (ref 70–99)

## 2022-06-22 LAB — TROPONIN I (HIGH SENSITIVITY)
Troponin I (High Sensitivity): 41 ng/L — ABNORMAL HIGH (ref <18)
Troponin I (High Sensitivity): 215 ng/L (ref <18)

## 2022-06-22 LAB — GLUCOSE, CAPILLARY
Glucose-Capillary: 158 mg/dL — ABNORMAL HIGH (ref 70–99)
Glucose-Capillary: 196 mg/dL — ABNORMAL HIGH (ref 70–99)

## 2022-06-22 LAB — CREATININE, SERUM
Creatinine, Ser: 1.74 mg/dL — ABNORMAL HIGH (ref 0.44–1.00)
GFR, Estimated: 31 mL/min — ABNORMAL LOW (ref >60)

## 2022-06-22 LAB — MRSA NEXT GEN BY PCR, NASAL: MRSA by PCR Next Gen: NOT DETECTED

## 2022-06-22 MED ORDER — DOCUSATE SODIUM 100 MG PO CAPS: 100.0000 mg | ORAL_CAPSULE | Freq: Two times a day (BID) | ORAL | Status: DC | PRN

## 2022-06-22 MED ORDER — FUROSEMIDE 10 MG/ML IJ SOLN
40.0000 mg | Freq: Two times a day (BID) | INTRAMUSCULAR | Status: DC
  Administered 2022-06-23 (×2): 40 mg via INTRAVENOUS
  Filled 2022-06-22 (×2): qty 4

## 2022-06-22 MED ORDER — HEPARIN SODIUM (PORCINE) 5000 UNIT/ML IJ SOLN
5000.0000 [IU] | Freq: Three times a day (TID) | INTRAMUSCULAR | Status: DC
  Administered 2022-06-22 – 2022-06-25 (×7): 5000 [IU] via SUBCUTANEOUS
  Filled 2022-06-22 (×6): qty 1

## 2022-06-22 MED ORDER — CLEVIDIPINE BUTYRATE 0.5 MG/ML IV EMUL
0.0000 mg/h | INTRAVENOUS | Status: DC
  Administered 2022-06-22: 21 mg/h via INTRAVENOUS
  Administered 2022-06-22: 20 mg/h via INTRAVENOUS
  Administered 2022-06-23: 14 mg/h via INTRAVENOUS
  Administered 2022-06-23: 3 mg/h via INTRAVENOUS
  Filled 2022-06-22 (×4): qty 100

## 2022-06-22 MED ORDER — IOHEXOL 350 MG/ML SOLN
75.0000 mL | Freq: Once | INTRAVENOUS | Status: AC | PRN
  Administered 2022-06-22: 75 mL via INTRAVENOUS

## 2022-06-22 MED ORDER — CLOPIDOGREL BISULFATE 75 MG PO TABS
75.0000 mg | ORAL_TABLET | Freq: Every day | ORAL | Status: DC
  Administered 2022-06-22 – 2022-06-25 (×4): 75 mg via ORAL
  Filled 2022-06-22 (×4): qty 1

## 2022-06-22 MED ORDER — NITROGLYCERIN IN D5W 200-5 MCG/ML-% IV SOLN
5.0000 ug/min | INTRAVENOUS | Status: DC
  Administered 2022-06-22: 5 ug/min via INTRAVENOUS
  Filled 2022-06-22: qty 250

## 2022-06-22 MED ORDER — NITROGLYCERIN 0.4 MG SL SUBL
0.4000 mg | SUBLINGUAL_TABLET | Freq: Once | SUBLINGUAL | Status: AC
  Administered 2022-06-22: 0.4 mg via SUBLINGUAL
  Filled 2022-06-22: qty 1

## 2022-06-22 MED ORDER — CLEVIDIPINE BUTYRATE 0.5 MG/ML IV EMUL
0.0000 mg/h | INTRAVENOUS | Status: DC
  Administered 2022-06-22: 2 mg/h via INTRAVENOUS
  Filled 2022-06-22: qty 50
  Filled 2022-06-22: qty 100

## 2022-06-22 MED ORDER — NICARDIPINE HCL IN NACL 20-0.86 MG/200ML-% IV SOLN
INTRAVENOUS | Status: AC
  Filled 2022-06-22: qty 200

## 2022-06-22 MED ORDER — NITROGLYCERIN 0.4 MG SL SUBL
0.4000 mg | SUBLINGUAL_TABLET | Freq: Once | SUBLINGUAL | Status: AC
  Administered 2022-06-22: 0.4 mg via SUBLINGUAL

## 2022-06-22 MED ORDER — ORAL CARE MOUTH RINSE
15.0000 mL | OROMUCOSAL | Status: DC
  Administered 2022-06-22 – 2022-06-23 (×4): 15 mL via OROMUCOSAL

## 2022-06-22 MED ORDER — POLYETHYLENE GLYCOL 3350 17 G PO PACK
17.0000 g | PACK | Freq: Every day | ORAL | Status: DC | PRN
  Administered 2022-06-25: 17 g via ORAL
  Filled 2022-06-22: qty 1

## 2022-06-22 MED ORDER — INSULIN ASPART 100 UNIT/ML IJ SOLN: 0.0000 [IU] | Freq: Every day | INTRAMUSCULAR | Status: DC

## 2022-06-22 MED ORDER — METHYLPREDNISOLONE SODIUM SUCC 125 MG IJ SOLR
125.0000 mg | Freq: Once | INTRAMUSCULAR | Status: AC
  Administered 2022-06-22: 125 mg via INTRAVENOUS
  Filled 2022-06-22: qty 2

## 2022-06-22 MED ORDER — ASPIRIN 81 MG PO TBEC
81.0000 mg | DELAYED_RELEASE_TABLET | Freq: Every morning | ORAL | Status: DC
  Administered 2022-06-23 – 2022-06-25 (×3): 81 mg via ORAL
  Filled 2022-06-22 (×3): qty 1

## 2022-06-22 MED ORDER — IPRATROPIUM-ALBUTEROL 0.5-2.5 (3) MG/3ML IN SOLN
6.0000 mL | Freq: Once | RESPIRATORY_TRACT | Status: AC
  Administered 2022-06-22: 6 mL via RESPIRATORY_TRACT
  Filled 2022-06-22: qty 6

## 2022-06-22 MED ORDER — CLONIDINE HCL 0.2 MG PO TABS
0.2000 mg | ORAL_TABLET | Freq: Three times a day (TID) | ORAL | Status: DC
  Administered 2022-06-22 – 2022-06-24 (×5): 0.2 mg via ORAL
  Filled 2022-06-22: qty 2
  Filled 2022-06-22 (×2): qty 1
  Filled 2022-06-22 (×2): qty 2

## 2022-06-22 MED ORDER — ORAL CARE MOUTH RINSE: 15.0000 mL | OROMUCOSAL | Status: DC | PRN

## 2022-06-22 MED ORDER — NITROGLYCERIN IN D5W 200-5 MCG/ML-% IV SOLN: 5.0000 ug/min | INTRAVENOUS | Status: DC

## 2022-06-22 MED ORDER — FUROSEMIDE 10 MG/ML IJ SOLN
80.0000 mg | Freq: Once | INTRAMUSCULAR | Status: AC
  Administered 2022-06-22: 80 mg via INTRAVENOUS
  Filled 2022-06-22: qty 8

## 2022-06-22 MED ORDER — INSULIN ASPART 100 UNIT/ML IJ SOLN
0.0000 [IU] | Freq: Three times a day (TID) | INTRAMUSCULAR | Status: DC
  Administered 2022-06-23: 5 [IU] via SUBCUTANEOUS
  Administered 2022-06-23: 2 [IU] via SUBCUTANEOUS
  Administered 2022-06-23: 3 [IU] via SUBCUTANEOUS
  Administered 2022-06-24: 2 [IU] via SUBCUTANEOUS
  Administered 2022-06-25: 3 [IU] via SUBCUTANEOUS

## 2022-06-22 MED ORDER — CARVEDILOL 25 MG PO TABS
25.0000 mg | ORAL_TABLET | Freq: Two times a day (BID) | ORAL | Status: DC
  Administered 2022-06-22 – 2022-06-25 (×6): 25 mg via ORAL
  Filled 2022-06-22 (×6): qty 1

## 2022-06-22 MED ORDER — HYDROCHLOROTHIAZIDE 12.5 MG PO TABS
12.5000 mg | ORAL_TABLET | Freq: Every day | ORAL | Status: DC
  Administered 2022-06-22 – 2022-06-24 (×3): 12.5 mg via ORAL
  Filled 2022-06-22 (×3): qty 1

## 2022-06-22 MED ORDER — NITROGLYCERIN 0.4 MG SL SUBL
SUBLINGUAL_TABLET | SUBLINGUAL | Status: AC
  Filled 2022-06-22: qty 1

## 2022-06-22 MED ORDER — HYDRALAZINE HCL 50 MG PO TABS
100.0000 mg | ORAL_TABLET | Freq: Once | ORAL | Status: AC
  Administered 2022-06-22: 100 mg via ORAL
  Filled 2022-06-22: qty 2

## 2022-06-22 NOTE — ED Provider Notes (Signed)
Patient was initially seen by Dr. Earle Gell.  Please see his note.  Patient is feeling much better now.  She is breathing more comfortably on her BiPAP.  Plan was for admission to the hospital however patient does have history of aortic aneurysm and she was severely hypertensive on arrival.  Unfortunately the CT scanner is not functioning any pain hospital so the patient was sent to the ED to have a CT angiogram of her chest first prior to admission to the hospital.   Linwood Dibbles, MD 06/22/22 1438

## 2022-06-22 NOTE — ED Notes (Signed)
Verbal order given to put nitro drip at 81mcg/ min. EDP remains at bedside.

## 2022-06-22 NOTE — Progress Notes (Signed)
Pt transported to 2H22 without event.

## 2022-06-22 NOTE — ED Provider Notes (Signed)
[name] Provider Note  CSN: [id number] Arrival date & time: 06/22/22 1037  Chief Complaint(s) Shortness of Breath  HPI [name] is a 72 y.o. 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  Patient states that she had acute onset shortness of breath this morning.  Patient found saturating approximately 75% on home oxygen and a mild amount of wheezing was heard and the patient was given 1 DuoNeb.  Patient reportedly had persistent hypoxia while receiving DuoNeb and was transition to CPAP.  Patient significant hypertensive with blood pressures 220/110.  Patient arrives in severe respiratory distress with significant tachypnea and accessory muscle use but no significant wheezing heard.  Patient arrives severely hypertensive with initial blood pressures 193/103.  Additional history unable to be obtained as patient is in severe respiratory distress.   Past Medical History Past Medical History:  Diagnosis Date   Anemia    Anxiety    Arthritis    Atrial fib/flutter, transient (HCC)    Cataract    COPD (chronic obstructive pulmonary disease) (HCC)    Depression    Diabetes mellitus    Diverticulosis    DJD (degenerative joint disease)    GERD (gastroesophageal reflux disease)    Gout    History of pneumonia    Hypertension    Hypothyroidism    Internal hemorrhoids    Patient Active Problem List   Diagnosis Date Noted   Acute respiratory failure with hypoxia and hypercarbia (HCC) 06/25/2020   COPD with acute exacerbation (HCC) 06/25/2020   Angioedema 06/25/2020   Stroke-like symptoms 01/30/2020   COVID-19 virus infection    Numbness and tingling of left lower extremity 01/29/2020   Paroxysmal atrial fibrillation (HCC) 01/29/2020    COPD (chronic obstructive pulmonary disease) (HCC)    AKI (acute kidney injury) (HCC)    Encounter for central line placement    Hypotension due to drugs    Hypoxia    Dissection of thoracic aorta (HCC)    Intramural aortic hematoma (HCC) 08/28/2019   Aortic aneurysm (HCC) 08/28/2019   Hypokalemia 05/06/2017   Abdominal pain 05/06/2017   Chronic atrial fibrillation (HCC) 03/21/2014   Enlarged lymph node 12/29/2011   OTHER&UNSPECIFIED DISEASES THE ORAL SOFT TISSUES 08/17/2008   GOUT 07/10/2008   ANKLE PAIN, RIGHT 07/10/2008   ASTHMATIC BRONCHITIS, ACUTE 05/10/2008   ABDOMINAL PAIN, UNSPECIFIED SITE 02/16/2008   KNEE PAIN, BILATERAL 06/17/2007   DEPRESSIVE DISORDER 12/25/2006   Pain in limb 12/25/2006   Lipoma of unspecified site 09/17/2006   Hypothyroidism 09/17/2006   Type 2 diabetes mellitus with hyperlipidemia (HCC) 09/17/2006   OBSTRUCTIVE SLEEP APNEA 09/17/2006   Hypertension associated with diabetes (HCC) 09/17/2006   BRADYCARDIA, CHRONIC 09/17/2006   HERNIA, SITE NOS W/O OBSTRUCTION/GANGRENE 09/17/2006   DEGENERATIVE JOINT DISEASE, HANDS 09/17/2006   PPM-Medtronic 09/17/2006   Home Medication(s) Prior to Admission medications   Medication Sig Start Date End Date Taking? Authorizing Provider  aspirin 81 MG tablet Take 81 mg by mouth every morning.     [provider]  carvedilol (COREG) 25 MG tablet Take 1 tablet (25 mg total) by mouth 2 (two) times daily with a meal. 10/11/19   Marinus Maw, MD  cholecalciferol (VITAMIN D) 1000 UNITS tablet Take 1,000 Units by mouth every morning.     [provider]  cloNIDine (CATAPRES) 0.2 MG tablet Take 1 tablet (0.2 mg total) by mouth 3 (three) times daily. 01/30/20   Vassie Loll, MD  clopidogrel (PLAVIX) 75 MG tablet Take 75 mg by mouth daily. 05/17/21   [provider]  famotidine (PEPCID) 20 MG tablet Take 20 mg by mouth daily. 12/17/21   [provider]  gabapentin (NEURONTIN) 100 MG capsule  Take 1 capsule (100 mg total) by mouth 3 (three) times daily. 09/19/19   Pokhrel, Laxman, MD  glimepiride (AMARYL) 1 MG tablet Take 1 tablet (1 mg total) by mouth every morning. 01/30/20 02/05/22  Vassie Loll, MD  hydrALAZINE (APRESOLINE) 100 MG tablet Take 100 mg by mouth 3 (three) times daily. 09/29/19   [provider]  hydrochlorothiazide (HYDRODIURIL) 12.5 MG tablet Take 12.5 mg by mouth daily.    [provider]  hydrOXYzine (ATARAX) 50 MG tablet Take 50 mg by mouth 3 (three) times daily. 12/17/21   [provider]  levothyroxine (SYNTHROID) 25 MCG tablet Take 1 tablet (25 mcg total) by mouth daily. 09/19/19   Pokhrel, Rebekah Chesterfield, MD  lovastatin (MEVACOR) 20 MG tablet Take 20 mg by mouth at bedtime. 06/20/20   [provider]  Multiple Vitamin (MULTIVITAMIN) tablet Take 1 tablet by mouth every morning.     [provider]  nicotine (NICODERM CQ - DOSED IN MG/24 HOURS) 14 mg/24hr patch Place 1 patch (14 mg total) onto the skin daily. 10/16/20   Hunsucker, Lesia Sago, MD  nicotine polacrilex (NICORETTE MINI) 2 MG lozenge Take 1 lozenge (2 mg total) by mouth as needed for smoking cessation. 10/16/20   Hunsucker, Lesia Sago, MD  Oxycodone HCl 10 MG TABS Take 10 mg by mouth 4 (four) times daily as needed (pain). 08/08/19   [provider]  OXYGEN Inhale 1 L into the lungs at bedtime.    [provider]  pantoprazole (PROTONIX) 40 MG tablet Take 1 tablet (40 mg total) by mouth daily. 09/20/19   Pokhrel, Rebekah Chesterfield, MD  polyethylene glycol (MIRALAX / GLYCOLAX) packet Take 17 g by mouth daily.    [provider]  rosuvastatin (CRESTOR) 20 MG tablet Take 1 tablet (20 mg total) by mouth daily. 09/20/19   Pokhrel, Rebekah Chesterfield, MD  sertraline (ZOLOFT) 100 MG tablet Take 100 mg by mouth at bedtime.    [provider]  STIOLTO RESPIMAT 2.5-2.5 MCG/ACT AERS INHALE 2 PUFFS INTO THE LUNGS ONCE DAILY 04/11/22   Hunsucker, Lesia Sago, MD  triamcinolone cream  (KENALOG) 0.1 % Apply topically 2 (two) times daily as needed (eczema). 01/29/22   [provider]                                                                                                                                    Past Surgical History Past Surgical History:  Procedure Laterality Date   ABDOMINAL AORTOGRAM W/LOWER EXTREMITY N/A 07/24/2020   Procedure: ABDOMINAL  AORTOGRAM W/LOWER EXTREMITY;  Surgeon: Nada Libman, MD;  Location: [facility];  Service: Cardiovascular;  Laterality: N/A;   CAROTID STENT  05/2021   CATARACT EXTRACTION W/ INTRAOCULAR LENS IMPLANT Left    ENDARTERECTOMY Left 09/09/2019   Procedure: LEFT ILIAC ENDARTERECTOMY;  Surgeon: Nada Libman, MD;  Location: [facility] OR;  Service: Vascular;  Laterality: Left;   HERNIA REPAIR     INSERTION OF ILIAC STENT Left 09/09/2019   Procedure: INSERTION OF LEFT ILIAC STENT;  Surgeon: Nada Libman, MD;  Location: [facility] OR;  Service: Vascular;  Laterality: Left;   PACEMAKER INSERTION     PERIPHERAL VASCULAR INTERVENTION Left 07/24/2020   Procedure: PERIPHERAL VASCULAR INTERVENTION;  Surgeon: Nada Libman, MD;  Location: [facility];  Service: Cardiovascular;  Laterality: Left;   THORACIC AORTIC ENDOVASCULAR STENT GRAFT N/A 09/09/2019   Procedure: THORACIC AORTIC ENDOVASCULAR STENT GRAFT WITH LEFT ILIAC CONDUIT;  Surgeon: Nada Libman, MD;  Location: [facility] OR;  Service: Vascular;  Laterality: N/A;   TUBAL LIGATION     ULTRASOUND GUIDANCE FOR VASCULAR ACCESS Left 09/09/2019   Procedure: ULTRASOUND GUIDANCE FOR VASCULAR ACCESS;  Surgeon: Nada Libman, MD;  Location: [facility] OR;  Service: Vascular;  Laterality: Left;   VAGINAL HYSTERECTOMY     Family History Family History  Problem Relation Age of Onset   Prostate cancer Father    Hypertension Mother    Heart attack Mother    Heart disease Mother    Kidney disease Mother 54   Stomach cancer Son    Diabetes Maternal Aunt    CAD Maternal Aunt     Stroke Maternal Aunt    Heart disease Maternal Uncle    CAD Maternal Uncle    Stroke Maternal Uncle    Colon cancer Neg Hx    Esophageal cancer Neg Hx    Rectal cancer Neg Hx     Social History Social History   Tobacco Use   Smoking status: Every Day    Packs/day: 1.00    Years: 50.00    Additional pack years: 0.00    Total pack years: 50.00    Types: Cigarettes    Last attempt to quit: 11/20/2020    Years since quitting: 1.5    Passive exposure: Never   Smokeless tobacco: Never   Tobacco comments:    7 cigarettes daily  Vaping Use   Vaping Use: Never used  Substance Use Topics   Alcohol use: No   Drug use: No   Allergies Amlodipine, Lisinopril, Losartan, Codeine phosphate, Morphine and codeine, Naproxen, Nsaids, Penicillins, Propoxyphene n-acetaminophen, Sulfamethoxazole-trimethoprim, and Flexeril [cyclobenzaprine]  Review of Systems Review of Systems  Unable to perform ROS: Severe respiratory distress    Physical Exam Vital Signs  I have reviewed the triage vital signs BP (!) 193/103   Pulse 83   Temp (!) 96.7 F (35.9 C) (Axillary)   Resp 18   Ht 5\' 9"  (1.753 m)   Wt 85.9 kg   SpO2 99%   BMI 27.97 kg/m   Physical Exam Vitals and nursing note reviewed.  Constitutional:      General: She is in acute distress.     Appearance: She is well-developed. She is ill-appearing.  HENT:     Head: Normocephalic and atraumatic.  Eyes:     Conjunctiva/sclera: Conjunctivae normal.  Cardiovascular:     Rate and Rhythm: Normal rate and regular rhythm.     Heart sounds: No murmur heard. Pulmonary:  Effort: Tachypnea, accessory muscle usage and respiratory distress present.     Breath sounds: Rales present.  Abdominal:     Palpations: Abdomen is soft.     Tenderness: There is no abdominal tenderness.  Musculoskeletal:        General: No swelling.     Cervical back: Neck supple.  Skin:    General: Skin is warm and dry.     Capillary Refill: Capillary  refill takes less than 2 seconds.  Neurological:     Mental Status: She is alert.  Psychiatric:        Mood and Affect: Mood normal.     ED Results and Treatments Labs (all labs ordered are listed, but only abnormal results are displayed) Labs Reviewed  COMPREHENSIVE METABOLIC PANEL - Abnormal; Notable for the following components:      Result Value   Glucose, Bld 157 (*)    BUN 28 (*)    Creatinine, Ser 1.79 (*)    GFR, Estimated 30 (*)    All other components within normal limits  CBC WITH DIFFERENTIAL/PLATELET - Abnormal; Notable for the following components:   RDW 18.6 (*)    All other components within normal limits  BRAIN NATRIURETIC PEPTIDE - Abnormal; Notable for the following components:   B Natriuretic Peptide 288.0 (*)    All other components within normal limits  CBG MONITORING, ED - Abnormal; Notable for the following components:   Glucose-Capillary 155 (*)    All other components within normal limits  TROPONIN I (HIGH SENSITIVITY) - Abnormal; Notable for the following components:   Troponin I (High Sensitivity) 41 (*)    All other components within normal limits  BLOOD GAS, VENOUS                                                                                                                          Radiology DG Chest Portable 1 View  Result Date: 06/22/2022 CLINICAL DATA:  72 year old female with history of dyspnea. EXAM: PORTABLE CHEST 1 VIEW COMPARISON:  Chest x-ray 06/25/2020. FINDINGS: There is cephalization of the pulmonary vasculature and slight indistinctness of the interstitial markings suggestive of mild pulmonary edema. No definite pleural effusions. Mild cardiomegaly. The patient is rotated to the right on today's exam, resulting in distortion of the mediastinal contours and reduced diagnostic sensitivity and specificity for mediastinal pathology. Aortic stent graft noted. Right-sided pacemaker device in place with lead tips projecting over the expected  location of the right atrium and right ventricle. IMPRESSION: 1. The appearance the chest suggests mild congestive heart failure, as above. 2. Postprocedural changes and support apparatus, as above. Electronically Signed   By: Trudie Reed M.D.   On: 06/22/2022 11:08    Pertinent labs & imaging results that were available during my care of the patient were reviewed by me and considered in my medical decision making (see MDM for details).  Medications Ordered in ED Medications  nitroGLYCERIN (NITROSTAT) SL tablet 0.4 mg (0.4  mg Sublingual Given 06/22/22 1050)                                                                                                                                     Procedures .Critical Care  Performed by: Glendora Score, MD Authorized by: Glendora Score, MD   Critical care provider statement:    Critical care time (minutes):  104   Critical care was necessary to treat or prevent imminent or life-threatening deterioration of the following conditions:  Respiratory failure   Critical care was time spent personally by me on the following activities:  Development of treatment plan with patient or surrogate, discussions with consultants, evaluation of patient's response to treatment, examination of patient, ordering and review of laboratory studies, ordering and review of radiographic studies, ordering and performing treatments and interventions, pulse oximetry, re-evaluation of patient's condition and review of old charts ARTERIAL LINE  Date/Time: 06/22/2022 1:47 PM  Performed by: Glendora Score, MD Authorized by: Glendora Score, MD   Consent:    Consent obtained:  Emergent situation Indications:    Indications: hemodynamic monitoring and multiple ABGs   Pre-procedure details:    Skin preparation:  Chlorhexidine Sedation:    Sedation type:  None Anesthesia:    Anesthesia method:  None Procedure details:    Location:  R radial   Placement technique:   Seldinger   Number of attempts:  1 Post-procedure details:    Post-procedure:  Sterile dressing applied   CMS:  Normal   Procedure completion:  Tolerated well, no immediate complications Ultrasound ED Peripheral IV (Provider)  Date/Time: 06/22/2022 1:47 PM  Performed by: Glendora Score, MD Authorized by: Glendora Score, MD   Procedure details:    Indications: multiple failed IV attempts     Skin Prep: isopropyl alcohol     Location:  Right AC   Angiocath:  20 G   Bedside Ultrasound Guided: Yes     Images: not archived     Patient tolerated procedure without complications: Yes     Dressing applied: Yes     (including critical care time)  Medical Decision Making / ED Course   This patient presents to the ED for concern of respiratory distress, this involves an extensive number of treatment options, and is a complaint that carries with it a high risk of complications and morbidity.  The differential diagnosis includes Pe, PTX, Pulmonary Edema, ARDS, COPD/Asthma, ACS, CHF exacerbation, Arrhythmia, Pericardial Effusion/Tamponade, Anemia, Sepsis, Acidosis/Hypercapnia, Anxiety, Viral URI  MDM: Patient seen emergency room for evaluation of shortness of breath.  Physical exam reveals a ill-appearing patient with tachypnea and accessory muscle use and rales at the bases while on CPAP.  Patient initially significantly hypertensive and due to concern for flash pulmonary edema patient received single dose nitroglycerin sublingual 0.4 mg and started on a nitroglycerin drip leading to improvement of blood pressures and respiratory rate.  Patient transition to BiPAP.  Laboratory  evaluation with a BUN of 28, creatinine 1.79, BNP elevated to 288.0, high-sensitivity troponin 41 likely demand ischemia in the setting of respiratory distress.  Chest x-ray with mild pulmonary edema.  ECG with an A paced rhythm but no obvious ischemia, unchanged from previous.  VBG with a pH of 7.37 and no significant  hypercarbia.  Initial plan was to admit to the hospitalist for suspected flash pulmonary edema with improving respiratory and blood pressure status.  However, after discussion with hospitalist, as the patient is currently not anticoagulated and having shortness of breath with presenting hypertension, difficult to exclude aortic pathology versus PE and patient should have CT imaging prior to admission and I agree.  Unfortunately we do not have an operational CT scanner today at [facility] and patient will require transfer to [name] for aortic imaging.  Patient ultimately accepted by Dr. Lynelle Doctor.  Shortly after an ambulance was called for transfer, patient had acute worsening of her chest pain and significant hypertension.  Blood pressure is persistently greater than 200 despite increasing nitroglycerin drip requirements.  Nitroglycerin running at 100/h and Cleviprex initiated as patient cannot tolerate nicardipine due to amlodipine reaction with facial swelling.  Heart rates have remained fairly stable at 70 being driven by her pacer and thus esmolol not initiated.  Delta troponin with uptrending troponin to 215 but no EKG changes.  I placed a right radial A-line and patient emergently transferred to [name] emergency department for acute aortic imaging to rule out dissection   Additional history obtained: -Additional history obtained from multiple different friends -External records from outside source obtained and reviewed including: Chart review including previous notes, labs, imaging, consultation notes   Lab Tests: -I ordered, reviewed, and interpreted labs.   The pertinent results include:   Labs Reviewed  COMPREHENSIVE METABOLIC PANEL - Abnormal; Notable for the following components:      Result Value   Glucose, Bld 157 (*)    BUN 28 (*)    Creatinine, Ser 1.79 (*)    GFR, Estimated 30 (*)    All other components within normal limits  CBC WITH DIFFERENTIAL/PLATELET - Abnormal; Notable  for the following components:   RDW 18.6 (*)    All other components within normal limits  BRAIN NATRIURETIC PEPTIDE - Abnormal; Notable for the following components:   B Natriuretic Peptide 288.0 (*)    All other components within normal limits  CBG MONITORING, ED - Abnormal; Notable for the following components:   Glucose-Capillary 155 (*)    All other components within normal limits  TROPONIN I (HIGH SENSITIVITY) - Abnormal; Notable for the following components:   Troponin I (High Sensitivity) 41 (*)    All other components within normal limits  BLOOD GAS, VENOUS      EKG   EKG Interpretation  Date/Time:  Sunday June 22 2022 12:46:30 EDT Ventricular Rate:  70 PR Interval:  254 QRS Duration: 88 QT Interval:  433 QTC Calculation: 468 R Axis:   -25 Text Interpretation: a-paced rhythm No significant change since last tracing Confirmed by Klayton Monie ([id number]) on 06/22/2022 1:42:42 PM         Imaging Studies ordered: I ordered imaging studies including chest x-ray I independently visualized and interpreted imaging. I agree with the radiologist interpretation\  CT dissection scan ordered and is pending   Medicines ordered and prescription drug management: Meds ordered this encounter  Medications   nitroGLYCERIN (NITROSTAT) SL tablet 0.4 mg    -I have reviewed the  patients home medicines and have made adjustments as needed  Critical interventions Nitro drip, BiPAP, Cleviprex, A-line insertion    Cardiac Monitoring: The patient was maintained on a cardiac monitor.  I personally viewed and interpreted the cardiac monitored which showed an underlying rhythm of: A paced rhythm  Social Determinants of Health:  Factors impacting patients care include: none   Reevaluation: After the interventions noted above, I reevaluated the patient and found that they have :stayed the same  Co morbidities that complicate the patient evaluation  Past Medical History:  Diagnosis  Date   Anemia    Anxiety    Arthritis    Atrial fib/flutter, transient (HCC)    Cataract    COPD (chronic obstructive pulmonary disease) (HCC)    Depression    Diabetes mellitus    Diverticulosis    DJD (degenerative joint disease)    GERD (gastroesophageal reflux disease)    Gout    History of pneumonia    Hypertension    Hypothyroidism    Internal hemorrhoids       Dispostion: I considered admission for this patient, and patient will require emergent transfer to [facility] for dissection rule out and ultimate admission for hypertensive emergency     Final Clinical Impression(s) / ED Diagnoses Final diagnoses:  None     @PCDICTATION @    Glendora Score, MD 06/22/22 1348

## 2022-06-22 NOTE — Progress Notes (Signed)
Approximately 1330 right A-line emergently placed by Dr. Posey Rea. A-line dressed, pulsatile flow noted.

## 2022-06-22 NOTE — H&P (Signed)
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 Per EMS, patient was 75% O2 sats despite her home oxygen, wheezing, and hypertensive with SBP >220.   Patient was given a duoneb and placed on CPAP and eventually transferred to [facility] ED. PCCM consulted for admission due to hypertensive crisis.   Pertinent  Medical History   Past Medical History:  Diagnosis Date   Anemia    Anxiety    Arthritis    Atrial fib/flutter, transient (HCC)    Cataract    COPD (chronic obstructive pulmonary disease) (HCC)    Depression    Diabetes mellitus    Diverticulosis    DJD (degenerative joint disease)    GERD (gastroesophageal reflux disease)    Gout    History of pneumonia    Hypertension    Hypothyroidism    Internal hemorrhoids      Significant Hospital Events: Including procedures, antibiotic start and stop dates in addition to other pertinent events   6/2 Admitted with acute respiratory distress and HTN crisis   Interim History / Subjective:  Patient on BIPAP, SOB improved some with BIPAP   Objective   Blood pressure 118/74, pulse 72, temperature (!) 97.1 F (36.2 C), temperature source Axillary, resp. rate 11, height 5\' 9"  (1.753 m), weight 85.9 kg, SpO2 100 %.    FiO2 (%):  [40 %-50 %] 40 %   Intake/Output Summary (Last 24 hours) at 06/22/2022 1700 Last data filed at 06/22/2022 1356 Gross per 24 hour  Intake 36.43 ml  Output --  Net 36.43 ml   Filed Weights   06/22/22 1042  Weight: 85.9 kg    Examination: General:  chronically ill adult female, lying on ED strecter on BIPAP HENT: normocephalic, Pink Moist MM Lungs: coarse crackles diminished in lower bases, tachypnea, no increased WOB Cardiovascular:  s1, s2 auscultated, RRR, no mrg  Abdomen: BS active  Extremities: non pitting edema, moves all extremities  Neuro: alert and oriented x 4, follows commands  GU: deferred   Resolved Hospital Problem list   N/a   Assessment & Plan:  Acute on chronic respiratory failure in setting of CHF exacerbation secondary to HTN crisis  P: SBP goal 150-170, continue to utilize aline  Continue to wean nitroglycerin off and utilize cleviprex until goal reached  Start oral hydralazine 100mg  this evening  Restart home medication carvedilol 25mg  BID Admit to ICU for continuous monitoring of VS and BP management  STAT ECHO  Labetalol   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 secondary to HTN crisis  Cr 1.79  Baseline Cr ~ 1.2-1.40  P:  Will give lasix 80mg  IV now, and 40mg  BID starting tomorrow 6/3 Monitor Strict I/O  Renal function panels daily   COPD hx  P: Continue Bronchodilators and IV steroids for now  Utilize BIPAP, may trial of this evening if respiratory status improved   Aortic aneurysm s/p repair hx CT of chest/abdom/pelvis-aortic stable aneursym, negative PE P:  Continue supportive care and management of  BP as above   DM Type 2 P:  SSI, ACHS CBG monitoring NPO for now with meds   Hypothyroidism:  P: Continue synthroid, restart tomorrow   GERD hx P: Start protonix 40mg  PO daily   DVT hx  No home anticoagulation  P:  SCDs Start SBQ heparin   Depression/anxiety hx P: Supportive care for now  Restart home meds tomorrow    Best Practice (right click and "Reselect all SmartList Selections" daily)   Diet/type: NPO DVT prophylaxis: heparin sbq  GI prophylaxis: PPI Lines: N/A Foley:  N/A Code Status:  full code Last date of multidisciplinary goals of care discussion [ updated pt at beside }    Labs   CBC: Recent Labs  Lab 06/22/22 1038  WBC 9.6  NEUTROABS 7.1  HGB 13.1  HCT 40.6  MCV 92.3  PLT 239    Basic Metabolic Panel: Recent Labs  Lab 06/22/22 1038  NA 137  K 3.9  CL 100  CO2 29  GLUCOSE 157*  BUN 28*  CREATININE 1.79*  CALCIUM 9.0   GFR: Estimated Creatinine Clearance: 33.2 mL/min (A) (by C-G formula based on SCr of 1.79 mg/dL (H)). Recent Labs  Lab 06/22/22 1038  WBC 9.6    Liver Function Tests: Recent Labs  Lab 06/22/22 1038  AST 30  ALT 18  ALKPHOS 86  BILITOT 0.7  PROT 7.9  ALBUMIN 3.9   No results for input(s): "LIPASE", "AMYLASE" in the last 168 hours. No results for input(s): "AMMONIA" in the last 168 hours.  ABG    Component Value Date/Time   PHART 7.386 09/09/2019 1313   PCO2ART 48.6 (H) 09/09/2019 1313   PO2ART 68 (L) 09/09/2019 1313   HCO3 35.0 (H) 06/22/2022 1038   TCO2 29 07/24/2020 0809   O2SAT 69.3 06/22/2022 1038     Coagulation Profile: No results for input(s): "INR", "PROTIME" in the last 168 hours.  Cardiac Enzymes: No results for input(s): "CKTOTAL", "CKMB", "CKMBINDEX", "TROPONINI" in the last 168 hours.  HbA1C: Hgb A1c MFr Bld  Date/Time Value Ref Range Status  06/25/2020 04:09 PM 7.4 (H) 4.8 - 5.6 % Final    Comment:    (NOTE)         Prediabetes: 5.7 - 6.4         Diabetes: >6.4         Glycemic control for adults with diabetes: <7.0   01/29/2020 04:44 AM 7.0 (H) 4.8 - 5.6 % Final    Comment:    (NOTE) Pre diabetes:          5.7%-6.4%  Diabetes:              >6.4%  Glycemic control for   <7.0% adults with diabetes     CBG: Recent Labs  Lab 06/22/22 1041  GLUCAP 155*    Review of Systems:   Please see the history of present illness. All other systems reviewed and are negative    Past Medical History:  She,  has a past medical history of Anemia, Anxiety, Arthritis, Atrial fib/flutter, transient (HCC), Cataract, COPD (chronic obstructive pulmonary disease) (HCC), Depression,  Diabetes mellitus, Diverticulosis, DJD (degenerative joint disease), GERD (gastroesophageal reflux disease), Gout, History of pneumonia, Hypertension, Hypothyroidism, and Internal hemorrhoids.   Surgical History:   Past Surgical History:  Procedure Laterality Date   ABDOMINAL AORTOGRAM W/LOWER EXTREMITY N/A 07/24/2020   Procedure: ABDOMINAL AORTOGRAM W/LOWER EXTREMITY;  Surgeon: Nada Libman, MD;  Location: [facility];  Service: Cardiovascular;  Laterality: N/A;   CAROTID STENT  05/2021   CATARACT EXTRACTION W/ INTRAOCULAR LENS IMPLANT Left    ENDARTERECTOMY Left 09/09/2019   Procedure: LEFT ILIAC ENDARTERECTOMY;  Surgeon: Nada Libman, MD;  Location: [facility] OR;  Service: Vascular;  Laterality: Left;   HERNIA REPAIR     INSERTION OF ILIAC STENT Left 09/09/2019   Procedure: INSERTION OF LEFT ILIAC STENT;  Surgeon: Nada Libman, MD;  Location: [facility] OR;  Service: Vascular;  Laterality: Left;   PACEMAKER INSERTION     PERIPHERAL VASCULAR INTERVENTION Left 07/24/2020   Procedure: PERIPHERAL VASCULAR INTERVENTION;  Surgeon: Nada Libman, MD;  Location: [facility];  Service: Cardiovascular;  Laterality: Left;   THORACIC AORTIC ENDOVASCULAR STENT GRAFT N/A 09/09/2019   Procedure: THORACIC AORTIC ENDOVASCULAR STENT GRAFT WITH LEFT ILIAC CONDUIT;  Surgeon: Nada Libman, MD;  Location: [facility] OR;  Service: Vascular;  Laterality: N/A;   TUBAL LIGATION     ULTRASOUND GUIDANCE FOR VASCULAR ACCESS Left 09/09/2019   Procedure: ULTRASOUND GUIDANCE FOR VASCULAR ACCESS;  Surgeon: Nada Libman, MD;  Location: [facility] OR;  Service: Vascular;  Laterality: Left;   VAGINAL HYSTERECTOMY       Social History:   reports that she has been smoking cigarettes. She has a 50.00 pack-year smoking history. She has never been exposed to tobacco smoke. She has never used smokeless tobacco. She reports that she does not drink alcohol and does not use drugs.   Family History:  Her family history  includes CAD in her maternal aunt and maternal uncle; Diabetes in her maternal aunt; Heart attack in her mother; Heart disease in her maternal uncle and mother; Hypertension in her mother; Kidney disease (age of onset: 89) in her mother; Prostate cancer in her father; Stomach cancer in her son; Stroke in her maternal aunt and maternal uncle. There is no history of Colon cancer, Esophageal cancer, or Rectal cancer.   Allergies Allergies  Allergen Reactions   Amlodipine Swelling    Started very close to episode of angioedema to ACE, unknown if this added effects or just residual   Lisinopril     angioedema   Losartan Swelling    Lip swelling    Codeine Phosphate Other (See Comments)    REACTION: UNKNOWN   Morphine And Codeine Other (See Comments)    Cannot take:Heart problems   Naproxen Nausea And Vomiting   Nsaids Nausea Only   Penicillins Other (See Comments)    UNKNOWN REACTION   Propoxyphene N-Acetaminophen Other (See Comments)    Makes me gag   Sulfamethoxazole-Trimethoprim Other (See Comments)    Gi upset   Flexeril [Cyclobenzaprine] Anxiety and Other (See Comments)    Pt states medication gives her nightmares and insomnia.     Home Medications  Prior to Admission medications   Medication Sig Start Date End Date Taking? Authorizing Provider  aspirin 81 MG tablet Take 81 mg by mouth every morning.    Yes [provider]  carvedilol (COREG) 25 MG tablet Take 1 tablet (25 mg total) by mouth 2 (two) times daily with a meal. 10/11/19  Yes Marinus Maw, MD  cholecalciferol (VITAMIN D) 1000 UNITS tablet Take 1,000 Units by mouth every morning.    Yes [provider]  cloNIDine (CATAPRES) 0.2 MG tablet Take 1 tablet (0.2 mg total) by mouth 3 (three) times daily. 01/30/20  Yes Vassie Loll, MD  clopidogrel (PLAVIX) 75 MG tablet Take 75 mg by mouth daily. 05/17/21  Yes  [provider]  clotrimazole-betamethasone (LOTRISONE) cream Apply topically. 03/14/22   Yes [provider]  famotidine (PEPCID) 20 MG tablet Take 20 mg by mouth daily. 12/17/21  Yes [provider]  gabapentin (NEURONTIN) 100 MG capsule Take 1 capsule (100 mg total) by mouth 3 (three) times daily. 09/19/19  Yes Pokhrel, Laxman, MD  glimepiride (AMARYL) 1 MG tablet Take 1 tablet (1 mg total) by mouth every morning. 01/30/20 06/22/22 Yes Vassie Loll, MD  hydrALAZINE (APRESOLINE) 100 MG tablet Take 100 mg by mouth 3 (three) times daily. 09/29/19  Yes [provider]  hydrochlorothiazide (HYDRODIURIL) 12.5 MG tablet Take 12.5 mg by mouth daily.   Yes [provider]  hydrOXYzine (ATARAX) 50 MG tablet Take 50 mg by mouth 3 (three) times daily. 12/17/21  Yes [provider]  levothyroxine (SYNTHROID) 25 MCG tablet Take 1 tablet (25 mcg total) by mouth daily. 09/19/19  Yes Pokhrel, Laxman, MD  lovastatin (MEVACOR) 20 MG tablet Take 20 mg by mouth at bedtime. 06/20/20  Yes [provider]  Multiple Vitamin (MULTIVITAMIN) tablet Take 1 tablet by mouth every morning.    Yes [provider]  Oxycodone HCl 10 MG TABS Take 10 mg by mouth 4 (four) times daily as needed (pain). 08/08/19  Yes [provider]  OXYGEN Inhale 1 L into the lungs at bedtime.   Yes [provider]  pantoprazole (PROTONIX) 40 MG tablet Take 1 tablet (40 mg total) by mouth daily. 09/20/19  Yes Pokhrel, Laxman, MD  polyethylene glycol (MIRALAX / GLYCOLAX) packet Take 17 g by mouth daily.   Yes [provider]  sertraline (ZOLOFT) 100 MG tablet Take 100 mg by mouth at bedtime.   Yes [provider]  STIOLTO RESPIMAT 2.5-2.5 MCG/ACT AERS INHALE 2 PUFFS INTO THE LUNGS ONCE DAILY Patient taking differently: Inhale 2 each into the lungs daily. 04/11/22  Yes Hunsucker, Lesia Sago, MD  triamcinolone cream (KENALOG) 0.1 % Apply topically 2 (two) times daily as needed (eczema). 01/29/22  Yes [provider]     Critical care time: 45  min    CRITICAL CARE Performed by: Rochel Brome S-ACNP    Total critical care time: 45 minutes  Critical care time was exclusive of separately billable procedures and treating other patients.  Critical care was necessary to treat or prevent imminent or life-threatening deterioration.  Critical care was time spent personally by me on the following activities: development of treatment plan with patient and/or surrogate as well as nursing, discussions with consultants, evaluation of patient's response to treatment, examination of patient, obtaining history from patient or surrogate, ordering and performing treatments and interventions, ordering and review of laboratory studies, ordering and review of radiographic studies, pulse oximetry and re-evaluation of patient's condition.

## 2022-06-22 NOTE — ED Triage Notes (Signed)
Patient transferred from [facility]. C/O SOB and chest pain, SBP 223 off of arterial line. HR 100's, 98% on Bipap. GCS 15.

## 2022-06-22 NOTE — ED Triage Notes (Signed)
Pt at home c/o SOB on 1 lpm. Pt went up to 3 lpm and no help. [facility] arrived pt oxygen was 90'2-80's during albuterol tx. CPAP applied. Bp 220/110 for [facility].

## 2022-06-22 NOTE — ED Notes (Signed)
Pt c/o of increased chest tightness that felt new. EDP made aware and is at bedside.  EDP states pt needs to go to [facility] emergency traffic.

## 2022-06-22 NOTE — ED Notes (Signed)
ED TO INPATIENT HANDOFF REPORT  ED Nurse Name and Phone #:   S Name/Age/Gender [name] 72 y.o. female Room/Bed: 021C/021C  Code Status   Code Status: Full Code  Home/SNF/Other Home Patient oriented to: self, place, time, and situation Is this baseline? No   Triage Complete: Triage complete  Chief Complaint Hypertensive crisis [I16.9]  Triage Note Pt at home c/o SOB on 1 lpm. Pt went up to 3 lpm and no help. [facility] arrived pt oxygen was 90'2-80's during albuterol tx. CPAP applied. Bp 220/110 for [facility].   Patient transferred from [facility]. C/O SOB and chest pain, SBP 223 off of arterial line. HR 100's, 98% on Bipap. GCS 15.    Allergies Allergies  Allergen Reactions   Amlodipine Swelling    Started very close to episode of angioedema to ACE, unknown if this added effects or just residual   Lisinopril     angioedema   Losartan Swelling    Lip swelling    Codeine Phosphate Other (See Comments)    REACTION: UNKNOWN   Morphine And Codeine Other (See Comments)    Cannot take:Heart problems   Naproxen Nausea And Vomiting   Nsaids Nausea Only   Penicillins Other (See Comments)    UNKNOWN REACTION   Propoxyphene N-Acetaminophen Other (See Comments)    Makes me gag   Sulfamethoxazole-Trimethoprim Other (See Comments)    Gi upset   Flexeril [Cyclobenzaprine] Anxiety and Other (See Comments)    Pt states medication gives her nightmares and insomnia.    Level of Care/Admitting Diagnosis ED Disposition     ED Disposition  Admit   Condition  --   Comment  Hospital Area: [facility] [[id number]]  Level of Care: ICU [6]  May admit patient to [facility] or [facility] if equivalent level of care is available:: Yes  Covid Evaluation: Asymptomatic - no recent exposure (last 10 days) testing not required  Diagnosis: Hypertensive crisis [782956]  Admitting Physician: Josephine Igo [[id number]]  Attending Physician: Josephine Igo [[id number]]   Certification:: I certify this patient will need inpatient services for at least 2 midnights  Estimated Length of Stay: 4          B Medical/Surgery History Past Medical History:  Diagnosis Date   Anemia    Anxiety    Arthritis    Atrial fib/flutter, transient (HCC)    Cataract    COPD (chronic obstructive pulmonary disease) (HCC)    Depression    Diabetes mellitus    Diverticulosis    DJD (degenerative joint disease)    GERD (gastroesophageal reflux disease)    Gout    History of pneumonia    Hypertension    Hypothyroidism    Internal hemorrhoids    Past Surgical History:  Procedure Laterality Date   ABDOMINAL AORTOGRAM W/LOWER EXTREMITY N/A 07/24/2020   Procedure: ABDOMINAL AORTOGRAM W/LOWER EXTREMITY;  Surgeon: Nada Libman, MD;  Location: [facility] INVASIVE CV LAB;  Service: Cardiovascular;  Laterality: N/A;   CAROTID STENT  05/2021   CATARACT EXTRACTION W/ INTRAOCULAR LENS IMPLANT Left    ENDARTERECTOMY Left 09/09/2019   Procedure: LEFT ILIAC ENDARTERECTOMY;  Surgeon: Nada Libman, MD;  Location: [facility] OR;  Service: Vascular;  Laterality: Left;   HERNIA REPAIR     INSERTION OF ILIAC STENT Left 09/09/2019   Procedure: INSERTION OF LEFT ILIAC STENT;  Surgeon: Nada Libman, MD;  Location: [facility] OR;  Service: Vascular;  Laterality: Left;   PACEMAKER  INSERTION     PERIPHERAL VASCULAR INTERVENTION Left 07/24/2020   Procedure: PERIPHERAL VASCULAR INTERVENTION;  Surgeon: Nada Libman, MD;  Location: [facility];  Service: Cardiovascular;  Laterality: Left;   THORACIC AORTIC ENDOVASCULAR STENT GRAFT N/A 09/09/2019   Procedure: THORACIC AORTIC ENDOVASCULAR STENT GRAFT WITH LEFT ILIAC CONDUIT;  Surgeon: Nada Libman, MD;  Location: [facility] OR;  Service: Vascular;  Laterality: N/A;   TUBAL LIGATION     ULTRASOUND GUIDANCE FOR VASCULAR ACCESS Left 09/09/2019   Procedure: ULTRASOUND GUIDANCE FOR VASCULAR ACCESS;  Surgeon: Nada Libman, MD;  Location: [facility] OR;   Service: Vascular;  Laterality: Left;   VAGINAL HYSTERECTOMY       A IV Location/Drains/Wounds Patient Lines/Drains/Airways Status     Active Line/Drains/Airways     Name Placement date Placement time Site Days   Arterial Line 06/22/22 Right Radial 06/22/22  1333  Radial  less than 1   Peripheral IV 06/22/22 20 G Anterior;Distal;Right;Upper Arm 06/22/22  1046  Arm  less than 1   Peripheral IV 06/22/22 20 G Left Antecubital 06/22/22  1047  Antecubital  less than 1            Intake/Output Last 24 hours  Intake/Output Summary (Last 24 hours) at 06/22/2022 1801 Last data filed at 06/22/2022 1356 Gross per 24 hour  Intake 36.43 ml  Output --  Net 36.43 ml    Labs/Imaging Results for orders placed or performed during the hospital encounter of 06/22/22 (from the past 48 hour(s))  Comprehensive metabolic panel     Status: Abnormal   Collection Time: 06/22/22 10:38 AM  Result Value Ref Range   Sodium 137 135 - 145 mmol/L   Potassium 3.9 3.5 - 5.1 mmol/L   Chloride 100 98 - 111 mmol/L   CO2 29 22 - 32 mmol/L   Glucose, Bld 157 (H) 70 - 99 mg/dL    Comment: Glucose reference range applies only to samples taken after fasting for at least 8 hours.   BUN 28 (H) 8 - 23 mg/dL   Creatinine, Ser 4.09 (H) 0.44 - 1.00 mg/dL   Calcium 9.0 8.9 - 81.1 mg/dL   Total Protein 7.9 6.5 - 8.1 g/dL   Albumin 3.9 3.5 - 5.0 g/dL   AST 30 15 - 41 U/L   ALT 18 0 - 44 U/L   Alkaline Phosphatase 86 38 - 126 U/L   Total Bilirubin 0.7 0.3 - 1.2 mg/dL   GFR, Estimated 30 (L) >60 mL/min    Comment: (NOTE) Calculated using the CKD-EPI Creatinine Equation (2021)    Anion gap 8 5 - 15    Comment: Performed at [facility], [address] [place], [place] [address]  Troponin I (High Sensitivity)     Status: Abnormal   Collection Time: 06/22/22 10:38 AM  Result Value Ref Range   Troponin I (High Sensitivity) 41 (H) <18 ng/L    Comment: (NOTE) Elevated high sensitivity troponin I (hsTnI) values and  significant  changes across serial measurements may suggest ACS but many other  chronic and acute conditions are known to elevate hsTnI results.  Refer to the "Links" section for chest pain algorithms and additional  guidance. Performed at [facility], [address] [place], [place] [address]   CBC with Differential     Status: Abnormal   Collection Time: 06/22/22 10:38 AM  Result Value Ref Range   WBC 9.6 4.0 - 10.5 K/uL   RBC 4.40 3.87 - 5.11  MIL/uL   Hemoglobin 13.1 12.0 - 15.0 g/dL   HCT 16.1 09.6 - 04.5 %   MCV 92.3 80.0 - 100.0 fL   MCH 29.8 26.0 - 34.0 pg   MCHC 32.3 30.0 - 36.0 g/dL   RDW 40.9 (H) 81.1 - 91.4 %   Platelets 239 150 - 400 K/uL   nRBC 0.0 0.0 - 0.2 %   Neutrophils Relative % 75 %   Neutro Abs 7.1 1.7 - 7.7 K/uL   Lymphocytes Relative 15 %   Lymphs Abs 1.4 0.7 - 4.0 K/uL   Monocytes Relative 7 %   Monocytes Absolute 0.7 0.1 - 1.0 K/uL   Eosinophils Relative 3 %   Eosinophils Absolute 0.3 0.0 - 0.5 K/uL   Basophils Relative 0 %   Basophils Absolute 0.0 0.0 - 0.1 K/uL   Immature Granulocytes 0 %   Abs Immature Granulocytes 0.04 0.00 - 0.07 K/uL    Comment: Performed at [facility], [address] [place], [place] [address]  Brain natriuretic peptide     Status: Abnormal   Collection Time: 06/22/22 10:38 AM  Result Value Ref Range   B Natriuretic Peptide 288.0 (H) 0.0 - 100.0 pg/mL    Comment: Performed at [facility], [address], [place], [place] [address]  Blood gas, venous (at [facility] and [facility])     Status: Abnormal   Collection Time: 06/22/22 10:38 AM  Result Value Ref Range   pH, Ven 7.37 7.25 - 7.43   pCO2, Ven 59 44 - 60 mmHg   pO2, Ven 40 32 - 45 mmHg   Bicarbonate 35.0 (H) 20.0 - 28.0 mmol/L   Acid-Base Excess 7.4 (H) 0.0 - 2.0 mmol/L   O2 Saturation 69.3 %   Patient temperature 36.0    Collection site RIGHT ANTECUBITAL    Drawn by [phone]     Comment: Performed at [facility], [address] [place], [place] [address]  CBG monitoring, ED      Status: Abnormal   Collection Time: 06/22/22 10:41 AM  Result Value Ref Range   Glucose-Capillary 155 (H) 70 - 99 mg/dL    Comment: Glucose reference range applies only to samples taken after fasting for at least 8 hours.  Troponin I (High Sensitivity)     Status: Abnormal   Collection Time: 06/22/22 12:36 PM  Result Value Ref Range   Troponin I (High Sensitivity) 215 (HH) <18 ng/L    Comment: CRITICAL RESULT CALLED TO, READ BACK BY AND VERIFIED WITH GAUNT, E AT 1311 ON 06/22/22 BY SMN. (NOTE) Elevated high sensitivity troponin I (hsTnI) values and significant  changes across serial measurements may suggest ACS but many other  chronic and acute conditions are known to elevate hsTnI results.  Refer to the "Links" section for chest pain algorithms and additional  guidance. Performed at [facility], [address] [place], [place] [address]    CT Angio Chest/Abd/Pel for Dissection W and/or Wo Contrast  Result Date: 06/22/2022 CLINICAL DATA:  Acute aortic syndrome suspected EXAM: CT ANGIOGRAPHY CHEST, ABDOMEN AND PELVIS TECHNIQUE: Non-contrast CT of the chest was initially obtained. Multidetector CT imaging through the chest, abdomen and pelvis was performed using the standard protocol during bolus administration of intravenous contrast. Multiplanar reconstructed images and MIPs were obtained and reviewed to evaluate the vascular anatomy. RADIATION DOSE REDUCTION: This exam was performed according to the departmental dose-optimization program which includes automated exposure control, adjustment of the mA and/or kV according to patient size and/or use of  iterative reconstruction technique. CONTRAST:  75mL OMNIPAQUE IOHEXOL 350 MG/ML SOLN COMPARISON:  CT angiogram chest abdomen and pelvis 07/11/2020. CT chest abdomen and pelvis 03/06/2021. FINDINGS: CTA CHEST FINDINGS Cardiovascular: There is adequate opacification of the aorta. There is stable mild aneurysmal dilatation of the ascending aorta  measuring 4.1 cm. Descending thoracic aortic graft is again noted which appears uncomplicated. There is no evidence for dissection or periaortic fluid/stranding. There is no central pulmonary embolism. The heart is enlarged. Right-sided pacemaker is present. There are atherosclerotic calcifications of the aorta. Mediastinum/Nodes: No enlarged mediastinal, hilar, or axillary lymph nodes. Thyroid gland, trachea, and esophagus demonstrate no significant findings. Lungs/Pleura: There is dependent atelectasis in the bilateral lower lobes. There are minimal patchy ground-glass opacities in both upper lobes. There is no pleural effusion or pneumothorax. Musculoskeletal: No acute fracture. Review of the MIP images confirms the above findings. CTA ABDOMEN AND PELVIS FINDINGS VASCULAR Aorta: Normal caliber aorta without aneurysm, dissection, vasculitis or significant stenosis. There are atherosclerotic calcifications of the aorta. Celiac: Patent without evidence of aneurysm, dissection, vasculitis or significant stenosis. SMA: Patent without evidence of aneurysm, dissection, vasculitis or significant stenosis. Renals: There is moderate focal stenosis in the proximal left renal artery. The right renal artery is within normal limits. IMA: Patent without evidence of aneurysm, dissection, vasculitis or significant stenosis. Inflow: Left external iliac artery stent appears widely patent. There are atherosclerotic calcifications throughout. There is a moderate focal stenosis secondary to noncalcified plaque in the distal aspect of the right external iliac artery which appears unchanged from prior. Veins: No obvious venous abnormality within the limitations of this arterial phase study. Review of the MIP images confirms the above findings. NON-VASCULAR Hepatobiliary: No focal liver abnormality is seen. No gallstones, gallbladder wall thickening, or biliary dilatation. Pancreas: Unremarkable. No pancreatic ductal dilatation or  surrounding inflammatory changes. Spleen: Normal in size without focal abnormality. Adrenals/Urinary Tract: Adrenal glands are unremarkable. Kidneys are normal, without renal calculi, focal lesion, or hydronephrosis. Bladder is unremarkable. Stomach/Bowel: Stomach is within normal limits. Appendix is not seen. No evidence of bowel wall thickening, distention, or inflammatory changes. Lymphatic: No enlarged lymph nodes are seen. Reproductive: Status post hysterectomy. No adnexal masses. Other: There is a small fat containing umbilical hernia. There is no ascites. Musculoskeletal: Degenerative changes affect the spine and hips. Review of the MIP images confirms the above findings. IMPRESSION: 1. No evidence for aortic dissection or rupture. 2. 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 Recommend semi-annual imaging followup by CTA or MRA and referral to cardiothoracic surgery if not already obtained. This recommendation follows 2010 ACCF/AHA/AATS/ACR/ASA/SCA/SCAI/SIR/STS/SVM Guidelines for the Diagnosis and Management of Patients With Thoracic Aortic Disease. Circulation. 2010; 121: Z610-R60. Aortic aneurysm NOS (ICD10-I71.9) 3. Stable cardiomegaly. 4. Stable left external iliac artery stent. 5. Stable moderate focal stenosis in the distal right external iliac artery. 6. Stable moderate focal stenosis in the proximal left renal artery. 7. Minimal patchy ground-glass opacities in both upper lobes, likely infectious/inflammatory. Aortic Atherosclerosis (ICD10-I70.0). Electronically Signed   By: Darliss Cheney M.D.   On: 06/22/2022 15:49   DG Chest Portable 1 View  Result Date: 06/22/2022 CLINICAL DATA:  72 year old female with history of dyspnea. EXAM: PORTABLE CHEST 1 VIEW COMPARISON:  Chest x-ray 06/25/2020. FINDINGS: There is cephalization of the pulmonary vasculature and slight indistinctness of the interstitial markings suggestive of mild pulmonary edema. No definite pleural effusions. Mild  cardiomegaly. The patient is rotated to the right on today's exam, resulting in distortion of the mediastinal contours  and reduced diagnostic sensitivity and specificity for mediastinal pathology. Aortic stent graft noted. Right-sided pacemaker device in place with lead tips projecting over the expected location of the right atrium and right ventricle. IMPRESSION: 1. The appearance the chest suggests mild congestive heart failure, as above. 2. Postprocedural changes and support apparatus, as above. Electronically Signed   By: Trudie Reed M.D.   On: 06/22/2022 11:08    Pending Labs Unresulted Labs (From admission, onward)     Start     Ordered   06/23/22 0500  Basic metabolic panel  Tomorrow morning,   R        06/22/22 1752   06/23/22 0500  Magnesium  Tomorrow morning,   R        06/22/22 1752   06/23/22 0500  CBC with Differential/Platelet  Tomorrow morning,   R        06/22/22 1752   06/22/22 1753  CBC  (heparin)  Once,   R       Comments: Baseline for heparin therapy IF NOT ALREADY DRAWN.  Notify MD if PLT < 100 K.    06/22/22 1752   06/22/22 1753  Creatinine, serum  (heparin)  Once,   R       Comments: Baseline for heparin therapy IF NOT ALREADY DRAWN.    06/22/22 1752   06/22/22 1753  Hemoglobin A1c  (Glycemic Control (SSI)  Q 4 Hours / Glycemic Control (SSI)  AC +/- HS)  Once,   R       Comments: To assess prior glycemic control    06/22/22 1752            Vitals/Pain Today's Vitals   06/22/22 1700 06/22/22 1715 06/22/22 1730 06/22/22 1745  BP: 113/72 115/67 (!) 144/101   Pulse: 70 78 81 72  Resp: 16 17 (!) 22 18  Temp:      TempSrc:      SpO2: 100% 100% 100% 100%  Weight:      Height:      PainSc:        Isolation Precautions No active isolations  Medications Medications  nitroGLYCERIN 50 mg in dextrose 5 % 250 mL (0.2 mg/mL) infusion (100 mcg/min Intravenous Infusion Verify 06/22/22 1328)  nitroGLYCERIN (NITROSTAT) 0.4 MG SL tablet (has no administration  in time range)  clevidipine (CLEVIPREX) infusion 0.5 mg/mL (4 mg/hr Intravenous Rate/Dose Change 06/22/22 1640)  hydrALAZINE (APRESOLINE) tablet 100 mg (has no administration in time range)  docusate sodium (COLACE) capsule 100 mg (has no administration in time range)  polyethylene glycol (MIRALAX / GLYCOLAX) packet 17 g (has no administration in time range)  heparin injection 5,000 Units (has no administration in time range)  insulin aspart (novoLOG) injection 0-5 Units (has no administration in time range)  insulin aspart (novoLOG) injection 0-15 Units (has no administration in time range)  furosemide (LASIX) injection 80 mg (has no administration in time range)  furosemide (LASIX) injection 40 mg (has no administration in time range)  aspirin EC tablet 81 mg (has no administration in time range)  carvedilol (COREG) tablet 25 mg (has no administration in time range)  cloNIDine (CATAPRES) tablet 0.2 mg (has no administration in time range)  clopidogrel (PLAVIX) tablet 75 mg (has no administration in time range)  hydrochlorothiazide (HYDRODIURIL) tablet 12.5 mg (has no administration in time range)  nitroGLYCERIN (NITROSTAT) SL tablet 0.4 mg (0.4 mg Sublingual Given 06/22/22 1050)  ipratropium-albuterol (DUONEB) 0.5-2.5 (3) MG/3ML nebulizer solution 6 mL (6 mLs Nebulization  Given 06/22/22 1230)  methylPREDNISolone sodium succinate (SOLU-MEDROL) 125 mg/2 mL injection 125 mg (125 mg Intravenous Given 06/22/22 1220)  nitroGLYCERIN (NITROSTAT) SL tablet 0.4 mg (0.4 mg Sublingual Given 06/22/22 1254)  iohexol (OMNIPAQUE) 350 MG/ML injection 75 mL (75 mLs Intravenous Contrast Given 06/22/22 1519)    Mobility walks     Focused Assessments Pulmonary Assessment Handoff:  Lung sounds: Bilateral Breath Sounds: Diminished, Clear L Breath Sounds: Diminished, Expiratory wheezes R Breath Sounds: Diminished, Expiratory wheezes O2 Device: Bi-PAP      R Recommendations: See Admitting Provider Note  Report  given to:   Additional Notes:

## 2022-06-22 NOTE — Progress Notes (Signed)
RT transported Pt from ED room 12 to CT and back on bipap without any complications. RN at bedside.

## 2022-06-22 NOTE — ED Provider Notes (Signed)
Care of patient received from prior provider at 3:11 PM, please see their note for complete H/P and care plan.  Received handoff per ED course.  Clinical Course as of 06/22/22 2353  Sun Jun 22, 2022  1509 Stable [facility] transfer HTN emergency with SOB and troponin elevation. HX of TAA. On BiPAP/NTG/Cleviprex CTA Dissection protocol.  [CC]  1553 CTA reviewed.  No new focal pathology.  Will plan for admission on Cleviprex for hypertensive emergency. [CC]    Clinical Course User Index [CC] Glyn Ade, MD    Reassessment: Patient transferred from [facility] for hypertensive emergency needing a CTA dissection protocol not available at their facility.  Continued Cleviprex titration on arrival.  CTA nondiagnostic.  Consulted critical care who agreed with need for arrival. CRITICAL CARE Performed by: Glyn Ade   Total critical care time: 30 minutes  Critical care time was exclusive of separately billable procedures and treating other patients.  Critical care was necessary to treat or prevent imminent or life-threatening deterioration.  Critical care was time spent personally by me on the following activities: development of treatment plan with patient and/or surrogate as well as nursing, discussions with consultants, evaluation of patient's response to treatment, examination of patient, obtaining history from patient or surrogate, ordering and performing treatments and interventions, ordering and review of laboratory studies, ordering and review of radiographic studies, pulse oximetry and re-evaluation of patient's condition.      Glyn Ade, MD 06/22/22 [phone]

## 2022-06-23 ENCOUNTER — Inpatient Hospital Stay (HOSPITAL_COMMUNITY): Payer: 59

## 2022-06-23 DIAGNOSIS — R0603 Acute respiratory distress: Secondary | ICD-10-CM

## 2022-06-23 DIAGNOSIS — I169 Hypertensive crisis, unspecified: Secondary | ICD-10-CM | POA: Diagnosis not present

## 2022-06-23 LAB — CBC WITH DIFFERENTIAL/PLATELET
Abs Immature Granulocytes: 0.1 10*3/uL — ABNORMAL HIGH (ref 0.00–0.07)
Basophils Absolute: 0 10*3/uL (ref 0.0–0.1)
Basophils Relative: 0 %
Eosinophils Absolute: 0 10*3/uL (ref 0.0–0.5)
Eosinophils Relative: 0 %
HCT: 39 % (ref 36.0–46.0)
Hemoglobin: 12.6 g/dL (ref 12.0–15.0)
Immature Granulocytes: 1 %
Lymphocytes Relative: 7 %
Lymphs Abs: 1 10*3/uL (ref 0.7–4.0)
MCH: 29.4 pg (ref 26.0–34.0)
MCHC: 32.3 g/dL (ref 30.0–36.0)
MCV: 90.9 fL (ref 80.0–100.0)
Monocytes Absolute: 0.3 10*3/uL (ref 0.1–1.0)
Monocytes Relative: 2 %
Neutro Abs: 11.8 10*3/uL — ABNORMAL HIGH (ref 1.7–7.7)
Neutrophils Relative %: 90 %
Platelets: 245 10*3/uL (ref 150–400)
RBC: 4.29 MIL/uL (ref 3.87–5.11)
RDW: 18.4 % — ABNORMAL HIGH (ref 11.5–15.5)
WBC: 13.1 10*3/uL — ABNORMAL HIGH (ref 4.0–10.5)
nRBC: 0 % (ref 0.0–0.2)

## 2022-06-23 LAB — BASIC METABOLIC PANEL
Anion gap: 10 (ref 5–15)
BUN: 33 mg/dL — ABNORMAL HIGH (ref 8–23)
CO2: 23 mmol/L (ref 22–32)
Calcium: 8.6 mg/dL — ABNORMAL LOW (ref 8.9–10.3)
Chloride: 103 mmol/L (ref 98–111)
Creatinine, Ser: 2.26 mg/dL — ABNORMAL HIGH (ref 0.44–1.00)
GFR, Estimated: 23 mL/min — ABNORMAL LOW (ref >60)
Glucose, Bld: 179 mg/dL — ABNORMAL HIGH (ref 70–99)
Potassium: 4.1 mmol/L (ref 3.5–5.1)
Sodium: 136 mmol/L (ref 135–145)

## 2022-06-23 LAB — ECHOCARDIOGRAM COMPLETE
AR max vel: 1.97 cm2
AV Area VTI: 2.02 cm2
AV Area mean vel: 1.88 cm2
AV Mean grad: 12 mmHg
AV Peak grad: 19.9 mmHg
Ao pk vel: 2.23 m/s
Area-P 1/2: 3.03 cm2
Height: 69 in
S' Lateral: 2.3 cm
Weight: 3100.55 oz

## 2022-06-23 LAB — GLUCOSE, CAPILLARY
Glucose-Capillary: 151 mg/dL — ABNORMAL HIGH (ref 70–99)
Glucose-Capillary: 138 mg/dL — ABNORMAL HIGH (ref 70–99)
Glucose-Capillary: 209 mg/dL — ABNORMAL HIGH (ref 70–99)
Glucose-Capillary: 185 mg/dL — ABNORMAL HIGH (ref 70–99)
Glucose-Capillary: 157 mg/dL — ABNORMAL HIGH (ref 70–99)

## 2022-06-23 LAB — MAGNESIUM: Magnesium: 2.1 mg/dL (ref 1.7–2.4)

## 2022-06-23 MED ORDER — SERTRALINE HCL 100 MG PO TABS
100.0000 mg | ORAL_TABLET | Freq: Every day | ORAL | Status: DC
  Administered 2022-06-23 – 2022-06-24 (×2): 100 mg via ORAL
  Filled 2022-06-23 (×2): qty 1

## 2022-06-23 MED ORDER — CHLORHEXIDINE GLUCONATE CLOTH 2 % EX PADS
6.0000 | MEDICATED_PAD | Freq: Every day | CUTANEOUS | Status: DC
  Administered 2022-06-23 – 2022-06-25 (×3): 6 via TOPICAL

## 2022-06-23 MED ORDER — HYDRALAZINE HCL 50 MG PO TABS
100.0000 mg | ORAL_TABLET | Freq: Three times a day (TID) | ORAL | Status: DC
  Administered 2022-06-23 – 2022-06-25 (×8): 100 mg via ORAL
  Filled 2022-06-23 (×8): qty 2

## 2022-06-23 MED ORDER — ORAL CARE MOUTH RINSE: 15.0000 mL | OROMUCOSAL | Status: DC | PRN

## 2022-06-23 MED ORDER — NICOTINE 14 MG/24HR TD PT24
14.0000 mg | MEDICATED_PATCH | Freq: Every day | TRANSDERMAL | Status: DC
  Administered 2022-06-23 – 2022-06-25 (×3): 14 mg via TRANSDERMAL
  Filled 2022-06-23 (×3): qty 1

## 2022-06-23 MED ORDER — OXYCODONE HCL 5 MG PO TABS
5.0000 mg | ORAL_TABLET | Freq: Four times a day (QID) | ORAL | Status: DC | PRN
  Administered 2022-06-23 – 2022-06-25 (×7): 5 mg via ORAL
  Filled 2022-06-23 (×7): qty 1

## 2022-06-23 MED ORDER — LABETALOL HCL 5 MG/ML IV SOLN
10.0000 mg | INTRAVENOUS | Status: DC | PRN
  Administered 2022-06-24 – 2022-06-25 (×2): 10 mg via INTRAVENOUS
  Filled 2022-06-23 (×3): qty 4

## 2022-06-23 NOTE — Progress Notes (Signed)
RT NOTE: patient resting comfortably on 2L [place] with no respiratory distress noted.  Bipap on standby at bedside.  Order changed to PRN.  Will continue to monitor and assess for bipap needs.

## 2022-06-23 NOTE — Progress Notes (Addendum)
eLink Physician-Brief Progress Note Patient Name: [name] DOB: Sep 02, 1950 MRN: [id number]   Date of Service  06/23/2022  HPI/Events of Note  72 year old initially presented with acute hypoxic respiratory in the setting of heart failure, AKI on CKD and a hypertensive crisis.  Has been receiving Lasix but has poor urinary output.  Bladder scan shows 220 cc but technically difficult due to the size.  eICU Interventions  In-N-Out catheter once   0450 - add nicotine patch  Intervention Category Intermediate Interventions: Oliguria - evaluation and management  Johnnay Pleitez 06/23/2022, 3:09 AM

## 2022-06-23 NOTE — Progress Notes (Signed)
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 Per EMS, patient was 75% O2 sats despite her home oxygen, wheezing, and hypertensive with SBP >220.   Patient was given a duoneb and placed on CPAP and eventually transferred to [facility] ED. PCCM consulted for admission due to hypertensive crisis.   Pertinent  Medical History   Past Medical History:  Diagnosis Date   Anemia    Anxiety    Arthritis    Atrial fib/flutter, transient (HCC)    Cataract    COPD (chronic obstructive pulmonary disease) (HCC)    Depression    Diabetes mellitus    Diverticulosis    DJD (degenerative joint disease)    GERD (gastroesophageal reflux disease)    Gout    History of pneumonia    Hypertension    Hypothyroidism    Internal hemorrhoids      Significant Hospital Events: Including procedures, antibiotic start and stop dates in addition to other pertinent events   6/2 Admitted with acute respiratory distress and HTN crisis  6/3 resuming home medications and weaning cleviprex   Interim History / Subjective:  Pt feeling fairly well this morning, she wore bipap overnight and is on nasal cannula this morning  Creatinine is up-trending and on straight cath had <300cc UOP this morning   Objective   Blood pressure 122/70, pulse 70, temperature 98.6 F (37 C), temperature source Oral, resp. rate (!) 28, height 5\' 9"  (1.753 m), weight 87.9 kg, SpO2 100 %.    FiO2 (%):  [40 %-50 %] 50 %   Intake/Output Summary (Last 24 hours) at 06/23/2022  0803 Last data filed at 06/23/2022 0700 Gross per 24 hour  Intake 619.34 ml  Output 580 ml  Net 39.34 ml    Filed Weights   06/22/22 1042 06/23/22 0400  Weight: 85.9 kg 87.9 kg    General:  chronically ill-appearing F awake and in no distress  HEENT: MM pink/moist, sclera anicteric, pupils equal  Neuro: awake, alert and oriented  CV: s1s2 rrr, no m/r/g PULM:  diminished air entry in bilateral lower lobes, no rhonchi or wheezing, no distress  GI: soft, non-tender  Extremities: warm/dry, no edema  Skin: no rashes or lesions   Labs WBC 13 Creatinine 2.2 Glu 179   Resolved Hospital Problem list   N/a   Assessment & Plan:    Acute on chronic respiratory failure in setting of CHF exacerbation secondary to HTN crisis  Improving with diuresis and bipap -home hydralazine, clonidine, coreg and HCTZ resume -echo this morning with preserved EF, but mod-severe LVH, ? Infiltrative cardiomyopathy, follows with cardiology and will need outpatient follow up  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 secondary to HTN crisis  Cr 2.2 today with 580cc UOP Baseline Cr ~ 1.2-1.40  -continue lasix 40mg  bid and monitoring electrolytes and UOP -follow renal indices, if worsening will need nephrology consult    COPD hx  -Continue Bronchodilators, stop steroids as not wheezing  -bipap qhs  Aortic aneurysm  s/p repair hx CT of chest/abdom/pelvis-aortic stable aneursym, negative PE -Continue supportive care and management of BP as above   DM Type 2 -SSI, -ACHS CBG monitoring    Hypothyroidism:  -Continue synthroid  GERD hx - protonix 40mg  PO daily   DVT hx  No home anticoagulation  -heparin prophylaxis  Depression/anxiety hx Chronic pain -resume zoloft and oxycodone  Best Practice (right click and "Reselect all SmartList Selections" daily)   Diet/type: Regular consistency (see orders) DVT prophylaxis: heparin sbq  heparin GI prophylaxis: PPI Lines: N/A Foley:  N/A Code Status:  full  code Last date of multidisciplinary goals of care discussion [ updated pt at beside 6/3 }   Labs   CBC: Recent Labs  Lab 06/22/22 1038 06/22/22 1837 06/23/22 0249  WBC 9.6 13.3* 13.1*  NEUTROABS 7.1  --  11.8*  HGB 13.1 13.9 12.6  HCT 40.6 42.9 39.0  MCV 92.3 92.7 90.9  PLT 239 254 245     Basic Metabolic Panel: Recent Labs  Lab 06/22/22 1038 06/22/22 1837 06/23/22 0249  NA 137  --  136  K 3.9  --  4.1  CL 100  --  103  CO2 29  --  23  GLUCOSE 157*  --  179*  BUN 28*  --  33*  CREATININE 1.79* 1.74* 2.26*  CALCIUM 9.0  --  8.6*  MG  --   --  2.1    GFR: Estimated Creatinine Clearance: 26.6 mL/min (A) (by C-G formula based on SCr of 2.26 mg/dL (H)). Recent Labs  Lab 06/22/22 1038 06/22/22 1837 06/23/22 0249  WBC 9.6 13.3* 13.1*     Liver Function Tests: Recent Labs  Lab 06/22/22 1038  AST 30  ALT 18  ALKPHOS 86  BILITOT 0.7  PROT 7.9  ALBUMIN 3.9    No results for input(s): "LIPASE", "AMYLASE" in the last 168 hours. No results for input(s): "AMMONIA" in the last 168 hours.  ABG    Component Value Date/Time   PHART 7.386 09/09/2019 1313   PCO2ART 48.6 (H) 09/09/2019 1313   PO2ART 68 (L) 09/09/2019 1313   HCO3 35.0 (H) 06/22/2022 1038   TCO2 29 07/24/2020 0809   O2SAT 69.3 06/22/2022 1038     Coagulation Profile: No results for input(s): "INR", "PROTIME" in the last 168 hours.  Cardiac Enzymes: No results for input(s): "CKTOTAL", "CKMB", "CKMBINDEX", "TROPONINI" in the last 168 hours.  HbA1C: Hgb A1c MFr Bld  Date/Time Value Ref Range Status  06/25/2020 04:09 PM 7.4 (H) 4.8 - 5.6 % Final    Comment:    (NOTE)         Prediabetes: 5.7 - 6.4         Diabetes: >6.4         Glycemic control for adults with diabetes: <7.0   01/29/2020 04:44 AM 7.0 (H) 4.8 - 5.6 % Final    Comment:    (NOTE) Pre diabetes:          5.7%-6.4%  Diabetes:              >6.4%  Glycemic control for   <7.0% adults with diabetes     CBG: Recent Labs   Lab 06/22/22 1041 06/22/22 1839 06/22/22 2111 06/23/22 0613  GLUCAP 155* 158* 196* 151*     Review of Systems:   Please see the history of present illness. All other systems reviewed and are negative    Past Medical History:  She,  has a past medical  history of Anemia, Anxiety, Arthritis, Atrial fib/flutter, transient (HCC), Cataract, COPD (chronic obstructive pulmonary disease) (HCC), Depression, Diabetes mellitus, Diverticulosis, DJD (degenerative joint disease), GERD (gastroesophageal reflux disease), Gout, History of pneumonia, Hypertension, Hypothyroidism, and Internal hemorrhoids.   Surgical History:   Past Surgical History:  Procedure Laterality Date   ABDOMINAL AORTOGRAM W/LOWER EXTREMITY N/A 07/24/2020   Procedure: ABDOMINAL AORTOGRAM W/LOWER EXTREMITY;  Surgeon: Nada Libman, MD;  Location: [facility] IN[facility];  Service: Cardiovascular;  Laterality: N/A;   CAROTID STENT  05/2021   CATARACT EXTRACTION W/ INTRAOCULAR LENS IMPLANT Left    ENDARTERECTOMY Left 09/09/2019   Procedure: LEFT ILIAC ENDARTERECTOMY;  Surgeon: Nada Libman, MD;  Location: [facility] OR;  Service: Vascular;  Laterality: Left;   HERNIA REPAIR     INSERTION OF ILIAC STENT Left 09/09/2019   Procedure: INSERTION OF LEFT ILIAC STENT;  Surgeon: Nada Libman, MD;  Location: [facility] OR;  Service: Vascular;  Laterality: Left;   PACEMAKER INSERTION     PERIPHERAL VASCULAR INTERVENTION Left 07/24/2020   Procedure: PERIPHERAL VASCULAR INTERVENTION;  Surgeon: Nada Libman, MD;  Location: [facility];  Service: Cardiovascular;  Laterality: Left;   THORACIC AORTIC ENDOVASCULAR STENT GRAFT N/A 09/09/2019   Procedure: THORACIC AORTIC ENDOVASCULAR STENT GRAFT WITH LEFT ILIAC CONDUIT;  Surgeon: Nada Libman, MD;  Location: [facility] OR;  Service: Vascular;  Laterality: N/A;   TUBAL LIGATION     ULTRASOUND GUIDANCE FOR VASCULAR ACCESS Left 09/09/2019   Procedure: ULTRASOUND GUIDANCE FOR VASCULAR ACCESS;  Surgeon:  Nada Libman, MD;  Location: [facility] OR;  Service: Vascular;  Laterality: Left;   VAGINAL HYSTERECTOMY       Social History:   reports that she has been smoking cigarettes. She has a 50.00 pack-year smoking history. She has never been exposed to tobacco smoke. She has never used smokeless tobacco. She reports that she does not drink alcohol and does not use drugs.   Family History:  Her family history includes CAD in her maternal aunt and maternal uncle; Diabetes in her maternal aunt; Heart attack in her mother; Heart disease in her maternal uncle and mother; Hypertension in her mother; Kidney disease (age of onset: 24) in her mother; Prostate cancer in her father; Stomach cancer in her son; Stroke in her maternal aunt and maternal uncle. There is no history of Colon cancer, Esophageal cancer, or Rectal cancer.   Allergies Allergies  Allergen Reactions   Amlodipine Swelling    Started very close to episode of angioedema to ACE, unknown if this added effects or just residual   Lisinopril     angioedema   Losartan Swelling    Lip swelling    Codeine Phosphate Other (See Comments)    REACTION: UNKNOWN   Morphine And Codeine Other (See Comments)    Cannot take:Heart problems   Naproxen Nausea And Vomiting   Nsaids Nausea Only   Penicillins Other (See Comments)    UNKNOWN REACTION   Propoxyphene N-Acetaminophen Other (See Comments)    Makes me gag   Sulfamethoxazole-Trimethoprim Other (See Comments)    Gi upset   Flexeril [Cyclobenzaprine] Anxiety and Other (See Comments)    Pt states medication gives her nightmares and insomnia.     Home Medications  Prior to Admission medications   Medication Sig Start Date End Date Taking? Authorizing Provider  aspirin 81 MG tablet Take 81 mg by mouth every morning.    Yes [provider]  carvedilol (COREG) 25 MG tablet Take  1 tablet (25 mg total) by mouth 2 (two) times daily with a meal. 10/11/19  Yes Marinus Maw, MD   cholecalciferol (VITAMIN D) 1000 UNITS tablet Take 1,000 Units by mouth every morning.    Yes [provider]  cloNIDine (CATAPRES) 0.2 MG tablet Take 1 tablet (0.2 mg total) by mouth 3 (three) times daily. 01/30/20  Yes Vassie Loll, MD  clopidogrel (PLAVIX) 75 MG tablet Take 75 mg by mouth daily. 05/17/21  Yes [provider]  clotrimazole-betamethasone (LOTRISONE) cream Apply topically. 03/14/22  Yes [provider]  famotidine (PEPCID) 20 MG tablet Take 20 mg by mouth daily. 12/17/21  Yes [provider]  gabapentin (NEURONTIN) 100 MG capsule Take 1 capsule (100 mg total) by mouth 3 (three) times daily. 09/19/19  Yes Pokhrel, Laxman, MD  glimepiride (AMARYL) 1 MG tablet Take 1 tablet (1 mg total) by mouth every morning. 01/30/20 06/22/22 Yes Vassie Loll, MD  hydrALAZINE (APRESOLINE) 100 MG tablet Take 100 mg by mouth 3 (three) times daily. 09/29/19  Yes [provider]  hydrochlorothiazide (HYDRODIURIL) 12.5 MG tablet Take 12.5 mg by mouth daily.   Yes [provider]  hydrOXYzine (ATARAX) 50 MG tablet Take 50 mg by mouth 3 (three) times daily. 12/17/21  Yes [provider]  levothyroxine (SYNTHROID) 25 MCG tablet Take 1 tablet (25 mcg total) by mouth daily. 09/19/19  Yes Pokhrel, Laxman, MD  lovastatin (MEVACOR) 20 MG tablet Take 20 mg by mouth at bedtime. 06/20/20  Yes [provider]  Multiple Vitamin (MULTIVITAMIN) tablet Take 1 tablet by mouth every morning.    Yes [provider]  Oxycodone HCl 10 MG TABS Take 10 mg by mouth 4 (four) times daily as needed (pain). 08/08/19  Yes [provider]  OXYGEN Inhale 1 L into the lungs at bedtime.   Yes [provider]  pantoprazole (PROTONIX) 40 MG tablet Take 1 tablet (40 mg total) by mouth daily. 09/20/19  Yes Pokhrel, Laxman, MD  polyethylene glycol (MIRALAX / GLYCOLAX) packet Take 17 g by mouth daily.   Yes [provider]  sertraline (ZOLOFT)  100 MG tablet Take 100 mg by mouth at bedtime.   Yes [provider]  STIOLTO RESPIMAT 2.5-2.5 MCG/ACT AERS INHALE 2 PUFFS INTO THE LUNGS ONCE DAILY Patient taking differently: Inhale 2 each into the lungs daily. 04/11/22  Yes Hunsucker, Lesia Sago, MD  triamcinolone cream (KENALOG) 0.1 % Apply topically 2 (two) times daily as needed (eczema). 01/29/22  Yes [provider]     Critical care time: 32 min      CRITICAL CARE Performed by: Darcella Gasman Magic Mohler   Total critical care time: 32 minutes  Critical care time was exclusive of separately billable procedures and treating other patients.  Critical care was necessary to treat or prevent imminent or life-threatening deterioration.  Critical care was time spent personally by me on the following activities: development of treatment plan with patient and/or surrogate as well as nursing, discussions with consultants, evaluation of patient's response to treatment, examination of patient, obtaining history from patient or surrogate, ordering and performing treatments and interventions, ordering and review of laboratory studies, ordering and review of radiographic studies, pulse oximetry and re-evaluation of patient's condition.   Darcella Gasman Morenike Cuff, PA-C [facility] See Amion for pager If no response to pager , please call [phone] until 7pm After 7:00 pm call Elink  [phone]

## 2022-06-24 ENCOUNTER — Encounter (HOSPITAL_COMMUNITY): Payer: Self-pay | Admitting: Pulmonary Disease

## 2022-06-24 DIAGNOSIS — I169 Hypertensive crisis, unspecified: Secondary | ICD-10-CM | POA: Diagnosis not present

## 2022-06-24 LAB — BASIC METABOLIC PANEL
Anion gap: 11 (ref 5–15)
BUN: 44 mg/dL — ABNORMAL HIGH (ref 8–23)
CO2: 28 mmol/L (ref 22–32)
Calcium: 8.7 mg/dL — ABNORMAL LOW (ref 8.9–10.3)
Chloride: 98 mmol/L (ref 98–111)
Creatinine, Ser: 2.57 mg/dL — ABNORMAL HIGH (ref 0.44–1.00)
GFR, Estimated: 19 mL/min — ABNORMAL LOW (ref >60)
Glucose, Bld: 182 mg/dL — ABNORMAL HIGH (ref 70–99)
Potassium: 3.2 mmol/L — ABNORMAL LOW (ref 3.5–5.1)
Sodium: 137 mmol/L (ref 135–145)

## 2022-06-24 LAB — GLUCOSE, CAPILLARY
Glucose-Capillary: 105 mg/dL — ABNORMAL HIGH (ref 70–99)
Glucose-Capillary: 135 mg/dL — ABNORMAL HIGH (ref 70–99)
Glucose-Capillary: 84 mg/dL (ref 70–99)

## 2022-06-24 LAB — HEMOGLOBIN A1C
Hgb A1c MFr Bld: 7 % — ABNORMAL HIGH (ref 4.8–5.6)
Mean Plasma Glucose: 154 mg/dL

## 2022-06-24 LAB — SODIUM, URINE, RANDOM: Sodium, Ur: 65 mmol/L

## 2022-06-24 LAB — MAGNESIUM: Magnesium: 2.1 mg/dL (ref 1.7–2.4)

## 2022-06-24 LAB — TRIGLYCERIDES: Triglycerides: 75 mg/dL (ref <150)

## 2022-06-24 MED ORDER — POTASSIUM CHLORIDE CRYS ER 20 MEQ PO TBCR
40.0000 meq | EXTENDED_RELEASE_TABLET | Freq: Once | ORAL | Status: AC
  Administered 2022-06-24: 40 meq via ORAL
  Filled 2022-06-24: qty 2

## 2022-06-24 MED ORDER — CLONIDINE HCL 0.2 MG PO TABS
0.3000 mg | ORAL_TABLET | Freq: Three times a day (TID) | ORAL | Status: DC
  Administered 2022-06-24 – 2022-06-25 (×3): 0.3 mg via ORAL
  Filled 2022-06-24 (×3): qty 1

## 2022-06-24 MED ORDER — PNEUMOCOCCAL 20-VAL CONJ VACC 0.5 ML IM SUSY
0.5000 mL | PREFILLED_SYRINGE | INTRAMUSCULAR | Status: AC
  Administered 2022-06-25: 0.5 mL via INTRAMUSCULAR
  Filled 2022-06-24: qty 0.5

## 2022-06-24 MED ORDER — LEVOTHYROXINE SODIUM 25 MCG PO TABS
25.0000 ug | ORAL_TABLET | Freq: Every day | ORAL | Status: DC
  Administered 2022-06-24 – 2022-06-25 (×2): 25 ug via ORAL
  Filled 2022-06-24 (×2): qty 1

## 2022-06-24 NOTE — Progress Notes (Signed)
   Heart Failure Stewardship Pharmacist Progress Note   PCP: [name], MD PCP-Cardiologist: None    HPI:  72 yo F with PMH of CHF, aortic aneurysm, DVT, HTN, T2DM, and COPD.  She presented to the ED on 6/2 with shortness of breath and hypertensive with SBP >220. PCCM consulted for management of HTN crisis. Started on NTG and cleviprex with PTA carvedilol and hydralazine. CXR showed pulmonary edema. CTA without aortic dissection or rupture, stable cardiomegaly. ECHO 6/3 showed LVEF 60-65%, moderate to severe concentric LVH, no regional wall motion abnormalities, G1DD. Given the severity of LVH and low GLS, consider evaluation for infiltrative cardiomyopathy. Cleviprex and NTG now stopped.   Current HF Medications: Beta Blocker: carvedilol 25 mg BID Other: hydralazine 100 mg TID *also on clonidine 0.3 mg TID and HCTZ 12.5 mg daily  Prior to admission HF Medications: Beta blocker: carvedilol 25 mg BID Other: hydralazine 100 mg TID *also on clonidine 0.2 mg TID and HCTZ 12.5 mg daily  Pertinent Lab Values: Serum creatinine 2.57, BUN 44, Potassium 3.2, Sodium 137, BNP 288, Magnesium 2.1, A1c 7.0   Vital Signs: Weight: 186 lbs (admission weight: 189 lbs) Blood pressure: 180/90s  Heart rate: 70s  I/O: -0.4L yesterday; net -2.1L  Medication Assistance / Insurance Benefits Check: Does the patient have prescription insurance?  Yes Type of insurance plan: [facility] Medicare  Outpatient Pharmacy:  Prior to admission outpatient pharmacy: [facility] Is the patient willing to use [facility] TOC pharmacy at discharge? Yes Is the patient willing to transition their outpatient pharmacy to utilize a [facility] outpatient pharmacy?   Pending    Assessment: 1. Acute on chronic diastolic CHF (LVEF 60-65%). NYHA class II symptoms. - Off IV lasix. SCr bumped from 2.26>2.57 today. BL ~1.4. Strict I/Os and daily weights. Keep K>4 and Mg>2. KCl 40 mEq x 1 given for replacement.  - Continue carvedilol 25  mg BID - No ACE/ARB/ARNI with history of angioedema from lisinopril - No MRA with AKI - Continue hydralazine 100 mg TID - Consider adding Imdur - Clonidine increased today - May need to hold dose of HCTZ if renal function does not improve - No amlodipine with swelling - patient states this was very close to episode of angioedema  Plan: 1) Medication changes recommended at this time: - Add Imdur - May need to hold HCTZ  2) Patient assistance: - None pending  3)  Education  - To be completed prior to discharge  Sharen Hones, PharmD, BCPS Heart Failure Stewardship Pharmacist Phone [phone]

## 2022-06-24 NOTE — Progress Notes (Signed)
Heart Failure Nurse Navigator Progress Note  PCP: [name], MD PCP-Cardiologist: None Admission Diagnosis: None Admitted from: Home  Presentation:   [name] presented with Shortness of breath, uses oxygen at home , had to turn it up from 1 L to 3 L, BP hypertensive 220/110, respiratory distress, CPAP started. BNP 288, Troponin 215, CXR showing mild congestive heart failure, pulmonary edema,PCCM consulted for hypertensive crisis.  Patient was educated on the sign and symptoms of heart failure, daily weights, when to call her doctor or go to the ED, Diet/ fluid restrictions, taking all medications as prescribed and attending all medical appointments. Patient verbalized his understanding, a HF TOC is scheduled for 07/11/2022 @ 2 pm.   ECHO/ LVEF: 60-65% G1DD  Clinical Course:  Past Medical History:  Diagnosis Date   Anemia    Anxiety    Arthritis    Atrial fib/flutter, transient (HCC)    Cataract    COPD (chronic obstructive pulmonary disease) (HCC)    Depression    Diabetes mellitus    Diverticulosis    DJD (degenerative joint disease)    GERD (gastroesophageal reflux disease)    Gout    History of pneumonia    Hypertension    Hypothyroidism    Internal hemorrhoids      Social History   Socioeconomic History   Marital status: Widowed    Spouse name: Not on file   Number of children: 2   Years of education: Not on file   Highest education level: Not on file  Occupational History   Occupation: Disabled    Employer: UNEMPLOYED  Tobacco Use   Smoking status: Every Day    Packs/day: 0.50    Years: 50.00    Additional pack years: 0.00    Total pack years: 25.00    Types: Cigarettes    Passive exposure: Never   Smokeless tobacco: Never   Tobacco comments:    7 cigarettes daily  Vaping Use   Vaping Use: Never used  Substance and Sexual Activity   Alcohol use: No   Drug use: No   Sexual activity: Yes    Birth control/protection: Surgical  Other  Topics Concern   Not on file  Social History Narrative   Right handed    Has a son   Hostess at hotel    Social Determinants of Health   Financial Resource Strain: Low Risk  (06/24/2022)   Overall Financial Resource Strain (CARDIA)    Difficulty of Paying Living Expenses: Not very hard  Food Insecurity: No Food Insecurity (06/24/2022)   Hunger Vital Sign    Worried About Running Out of Food in the Last Year: Never true    Ran Out of Food in the Last Year: Never true  Transportation Needs: No Transportation Needs (06/24/2022)   PRAPARE - Administrator, Civil Service (Medical): No    Lack of Transportation (Non-Medical): No  Physical Activity: Unknown (11/28/2019)   Exercise Vital Sign    Days of Exercise per Week: Not on file    Minutes of Exercise per Session: 20 min  Stress: Stress Concern Present (11/28/2019)   [employer] of Occupational Health - Occupational Stress Questionnaire    Feeling of Stress : Rather much  Social Connections: Not on file   Education Assessment and Provision:  Detailed education and instructions provided on heart failure disease management including the following:  Signs and symptoms of Heart Failure When to call the physician Importance of daily weights  Low sodium diet Fluid restriction Medication management Anticipated future follow-up appointments  Patient education given on each of the above topics.  Patient acknowledges understanding via teach back method and acceptance of all instructions.  Education Materials:  "Living Better With Heart Failure" Booklet, HF zone tool, & Daily Weight Tracker Tool.  Patient has scale at home: yes Patient has pill box at home: yes     High Risk Criteria for Readmission and/or Poor Patient Outcomes: Heart failure hospital admissions (last 6 months):  0 No Show rate: 13% Difficult social situation: No Demonstrates medication adherence: Yes Primary Language: English Literacy level: Reading,  writing, and comprehension  Barriers of Care:   Diet/ fluid restrictions ( drinks too much daily per patient)  Daily weights  Considerations/Referrals:   Referral made to Heart Failure Pharmacist Stewardship: Yes Referral made to Heart Failure CSW/NCM TOC: No Referral made to Heart & Vascular TOC clinic: Yes, 07/11/2022 @ 2 pm  Items for Follow-up on DC/TOC: Diet/ fluid restrictions ( per patient drinks too much daily)  Daily weights Continued HF education    Rhae Hammock, BSN, RN Heart Failure Print production planner Chat Only

## 2022-06-24 NOTE — Progress Notes (Signed)
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  On presentation ,she was hypoxic despite being on home oxygen regimen, wheezing, severely hypertensive with systolic blood pressure more than 220.  Was put briefly on BiPAP.  Patient was admitted under PCCM service for the management of hypertensive crisis .patient transferred to [facility] service on 6/4 after improvement in the blood pressure.  Hospital course also remarkable for AKI on CKD stage IIIb.  Currently her blood pressure is improving.  Waiting for further improvement in the blood pressure and kidney function before discharge to home tomorrow if possible  Assessment & Plan:  Principal Problem:   Hypertensive crisis   Acute on chronic hypoxic respiratory failure in the setting of CHF exacerbation on the background of hypertensive crisis/COPD exacerbation: Echo done here showed EF of 60 to 60%, grade 1 diastolic dysfunction.  Also showed severe concentric left ventricular hypertrophy.  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 Started on IV diuresis, BiPAP.  Currently weaned to nasal cannula.  She is currently on 2 L of oxygen per minute which is her baseline.  AKI in CKD stage IIIb: Baseline creatinine is 1.2-1.4.  Started on Lasix 40 mg twice daily.  Creatinine has trended up to 2.57 today.  Stop diuresis.  Had dialysis of 2.5 L yesterday.  Continue monitor kidney function.  Will check if he required nephrology consultation if kidney function does not improve.  This morning she appears almost euvolemic.  She needs to follow-up with nephrology as an outpatient.  Will check urine  sodium  Severe hypertension: Systolic blood pressure more than 220 on presentation.  Had to be started on Cleviprex , now stopped.  Home medications resumed.  Continue to monitor.  Increased the dose of clonidine, continue carvedilol, hydralazine, hydrochlorothiazide.  If she continues to be hypertensive, will add Imdur.  COPD: Continue bronchodilators as needed.  Steroids stopped.  Was on BiPAP at night.  Hypokalemia: Supplemented with potassium.  Aortic aneurysm: Status post repair.  CT chest/abdomen/pelvis showed stable aortic aneurysm.  Negative for PE.  Diabetes type 2: Recent A1c of 7.  Continue sliding scale.  Monitor blood sugars  Hypothyroidism: Continue Synthyroid  GERD: Continue PPI  History of DVT: Not on anticoagulation.  Depression/anxiety/chronic pain syndrome: On Zoloft, oxycodone  Leukocytosis: Unclear etiology.  Most likely reactive.  Continue to monitor        DVT prophylaxis:heparin injection 5,000 Units Start: 06/22/22 2200 SCDs Start: 06/22/22 1753     Code Status: Full Code  Family Communication: Niece at bedside  Patient status:Inpatient  Patient is from :Home  Anticipated discharge WJ:XBJY  Estimated DC date:tomorrow   Consultants: PCCM  Procedures:none  Antimicrobials:  Anti-infectives (From admission, onward)    None       Subjective: Patient seen and examined at bedside today.  Still hypertensive this morning.  Denies any headache, nausea or vomiting.  She was lying in bed.  She looks comfortable and wants to go home.  Long discussion had with the patient and niece at bedside about the management plan, need to continue to monitor the blood pressure and kidney function tomorrow morning.  She was on 2 L of oxygen per minute.  Denies any shortness  of breath or cough.  Objective: Vitals:   06/24/22 0414 06/24/22 0435 06/24/22 0520 06/24/22 0603  BP: (!) 173/124 (!) 198/106 (!) 176/94 (!) 178/92  Pulse:      Resp:      Temp: 98 F  (36.7 C)     TempSrc: Oral     SpO2:      Weight:      Height:        Intake/Output Summary (Last 24 hours) at 06/24/2022 0804 Last data filed at 06/24/2022 0600 Gross per 24 hour  Intake 478.87 ml  Output 3000 ml  Net -2521.13 ml   Filed Weights   06/22/22 1042 06/23/22 0400 06/24/22 0400  Weight: 85.9 kg 87.9 kg 84.7 kg    Examination:  General exam: Overall comfortable, not in distress HEENT: PERRL Respiratory system:  no wheezes or crackles  Cardiovascular system: S1 & S2 heard, RRR.  Gastrointestinal system: Abdomen is nondistended, soft and nontender. Central nervous system: Alert and oriented Extremities: No edema, no clubbing ,no cyanosis Skin: No rashes, no ulcers,no icterus     Data Reviewed: I have personally reviewed following labs and imaging studies  CBC: Recent Labs  Lab 06/22/22 1038 06/22/22 1837 06/23/22 0249  WBC 9.6 13.3* 13.1*  NEUTROABS 7.1  --  11.8*  HGB 13.1 13.9 12.6  HCT 40.6 42.9 39.0  MCV 92.3 92.7 90.9  PLT 239 254 245   Basic Metabolic Panel: Recent Labs  Lab 06/22/22 1038 06/22/22 1837 06/23/22 0249 06/24/22 0110  NA 137  --  136 137  K 3.9  --  4.1 3.2*  CL 100  --  103 98  CO2 29  --  23 28  GLUCOSE 157*  --  179* 182*  BUN 28*  --  33* 44*  CREATININE 1.79* 1.74* 2.26* 2.57*  CALCIUM 9.0  --  8.6* 8.7*  MG  --   --  2.1 2.1     Recent Results (from the past 240 hour(s))  MRSA Next Gen by PCR, Nasal     Status: None   Collection Time: 06/22/22  6:40 PM   Specimen: Nasal Mucosa; Nasal Swab  Result Value Ref Range Status   MRSA by PCR Next Gen NOT DETECTED NOT DETECTED Final    Comment: (NOTE) The GeneXpert MRSA Assay (FDA approved for NASAL specimens only), is one component of a comprehensive MRSA colonization surveillance program. It is not intended to diagnose MRSA infection nor to guide or monitor treatment for MRSA infections. Test performance is not FDA approved in patients less than 15  years old. Performed at [facility], [address] [place], [place] [address]      Radiology Studies: ECHOCARDIOGRAM COMPLETE  Result Date: 06/23/2022    ECHOCARDIOGRAM REPORT   Patient Name:   [name] [facility] Date of Exam: 06/23/2022 Medical Rec #:  [id number]           Height:       69.0 in Accession #:    [id number]          Weight:       193.8 lb Date of Birth:  Dec 12, 1950          BSA:          2.038 m Patient Age:    71 years            BP:           152/79 mmHg Patient Gender: F  HR:           70 bpm. Exam Location:  Inpatient Procedure: 2D Echo, 3D Echo, Cardiac Doppler, Color Doppler and Strain Analysis Indications:    R06.03 Respiratory distress  History:        Patient has prior history of Echocardiogram examinations, most                 recent 01/29/2020. COPD, Arrythmias:Atrial Fibrillation; Risk                 Factors:Current Smoker and Hypertension.  Sonographer:    Dondra Prader RVT RCS Referring Phys: [id number] BRADLEY L ICARD IMPRESSIONS  1. Left ventricular ejection fraction, by estimation, is 60 to 65%. The left ventricle has normal function. The left ventricle has no regional wall motion abnormalities. There is moderate- severe concentric left ventricular hypertrophy. GLS -11.9%. Grade I diastolic dsyfunction. Due to severity of LVH and low GLS, consider evaluation for infiltrative cardiomyopathy.  2. Right ventricular systolic function is normal. The right ventricular size is normal.  3. The mitral valve is normal in structure. No evidence of mitral valve regurgitation. No evidence of mitral stenosis. Moderate mitral annular calcification.  4. The aortic valve is normal in structure. There is moderate calcification of the aortic valve. Aortic valve regurgitation is not visualized. No aortic stenosis is present.  5. The inferior vena cava is normal in size with greater than 50% respiratory variability, suggesting right atrial pressure of 3 mmHg. FINDINGS  Left  Ventricle: Left ventricular ejection fraction, by estimation, is 60 to 65%. The left ventricle has normal function. The left ventricle has no regional wall motion abnormalities. The left ventricular internal cavity size was normal in size. There is  severe concentric left ventricular hypertrophy. Left ventricular diastolic parameters are consistent with Grade I diastolic dysfunction (impaired relaxation). Right Ventricle: The right ventricular size is normal. No increase in right ventricular wall thickness. Right ventricular systolic function is normal. Left Atrium: Left atrial size was normal in size. Right Atrium: Right atrial size was normal in size. Pericardium: There is no evidence of pericardial effusion. Mitral Valve: The mitral valve is normal in structure. Moderate mitral annular calcification. No evidence of mitral valve regurgitation. No evidence of mitral valve stenosis. Tricuspid Valve: The tricuspid valve is normal in structure. Tricuspid valve regurgitation is not demonstrated. No evidence of tricuspid stenosis. Aortic Valve: The aortic valve is normal in structure. There is moderate calcification of the aortic valve. Aortic valve regurgitation is not visualized. No aortic stenosis is present. Aortic valve mean gradient measures 12.0 mmHg. Aortic valve peak gradient measures 19.9 mmHg. Aortic valve area, by VTI measures 2.02 cm. Pulmonic Valve: The pulmonic valve was normal in structure. Pulmonic valve regurgitation is mild. No evidence of pulmonic stenosis. Aorta: The aortic root is normal in size and structure. Venous: The inferior vena cava is normal in size with greater than 50% respiratory variability, suggesting right atrial pressure of 3 mmHg. IAS/Shunts: No atrial level shunt detected by color flow Doppler. Additional Comments: A device lead is visualized.  LEFT VENTRICLE PLAX 2D LVIDd:         3.60 cm   Diastology LVIDs:         2.30 cm   LV e' medial:    6.20 cm/s LV PW:         1.70 cm    LV E/e' medial:  12.9 LV IVS:        1.50 cm   LV e'  lateral:   6.20 cm/s LVOT diam:     1.80 cm   LV E/e' lateral: 12.9 LV SV:         99 LV SV Index:   49 LVOT Area:     2.54 cm                           3D Volume EF:                          3D EF:        53 %                          LV EDV:       111 ml                          LV ESV:       52 ml                          LV SV:        59 ml RIGHT VENTRICLE            IVC RV Basal diam:  3.70 cm    IVC diam: 1.50 cm RV S prime:     9.46 cm/s TAPSE (M-mode): 3.6 cm LEFT ATRIUM             Index        RIGHT ATRIUM           Index LA diam:        3.40 cm 1.67 cm/m   RA Area:     22.10 cm LA Vol (A2C):   56.6 ml 27.77 ml/m  RA Volume:   69.10 ml  33.90 ml/m LA Vol (A4C):   58.4 ml 28.65 ml/m LA Biplane Vol: 60.9 ml 29.88 ml/m  AORTIC VALVE                     PULMONIC VALVE AV Area (Vmax):    1.97 cm      PV Vmax:       0.95 m/s AV Area (Vmean):   1.88 cm      PV Peak grad:  3.6 mmHg AV Area (VTI):     2.02 cm AV Vmax:           223.00 cm/s AV Vmean:          165.000 cm/s AV VTI:            0.491 m AV Peak Grad:      19.9 mmHg AV Mean Grad:      12.0 mmHg LVOT Vmax:         173.00 cm/s LVOT Vmean:        122.000 cm/s LVOT VTI:          0.389 m LVOT/AV VTI ratio: 0.79  AORTA Ao Root diam: 2.90 cm Ao Asc diam:  3.70 cm MITRAL VALVE MV Area (PHT): 3.03 cm    SHUNTS MV Decel Time: 250 msec    Systemic VTI:  0.39 m MV E velocity: 79.70 cm/s  Systemic Diam: 1.80 cm MV A velocity: 95.90 cm/s MV E/A ratio:  0.83 Aditya Sabharwal Electronically signed by Dorthula Nettles Signature Date/Time: 06/23/2022/11:11:20 AM    Final  CT Angio Chest/Abd/Pel for Dissection W and/or Wo Contrast  Result Date: 06/22/2022 CLINICAL DATA:  Acute aortic syndrome suspected EXAM: CT ANGIOGRAPHY CHEST, ABDOMEN AND PELVIS TECHNIQUE: Non-contrast CT of the chest was initially obtained. Multidetector CT imaging through the chest, abdomen and pelvis was performed using the standard  protocol during bolus administration of intravenous contrast. Multiplanar reconstructed images and MIPs were obtained and reviewed to evaluate the vascular anatomy. RADIATION DOSE REDUCTION: This exam was performed according to the departmental dose-optimization program which includes automated exposure control, adjustment of the mA and/or kV according to patient size and/or use of iterative reconstruction technique. CONTRAST:  75mL OMNIPAQUE IOHEXOL 350 MG/ML SOLN COMPARISON:  CT angiogram chest abdomen and pelvis 07/11/2020. CT chest abdomen and pelvis 03/06/2021. FINDINGS: CTA CHEST FINDINGS Cardiovascular: There is adequate opacification of the aorta. There is stable mild aneurysmal dilatation of the ascending aorta measuring 4.1 cm. Descending thoracic aortic graft is again noted which appears uncomplicated. There is no evidence for dissection or periaortic fluid/stranding. There is no central pulmonary embolism. The heart is enlarged. Right-sided pacemaker is present. There are atherosclerotic calcifications of the aorta. Mediastinum/Nodes: No enlarged mediastinal, hilar, or axillary lymph nodes. Thyroid gland, trachea, and esophagus demonstrate no significant findings. Lungs/Pleura: There is dependent atelectasis in the bilateral lower lobes. There are minimal patchy ground-glass opacities in both upper lobes. There is no pleural effusion or pneumothorax. Musculoskeletal: No acute fracture. Review of the MIP images confirms the above findings. CTA ABDOMEN AND PELVIS FINDINGS VASCULAR Aorta: Normal caliber aorta without aneurysm, dissection, vasculitis or significant stenosis. There are atherosclerotic calcifications of the aorta. Celiac: Patent without evidence of aneurysm, dissection, vasculitis or significant stenosis. SMA: Patent without evidence of aneurysm, dissection, vasculitis or significant stenosis. Renals: There is moderate focal stenosis in the proximal left renal artery. The right renal artery is  within normal limits. IMA: Patent without evidence of aneurysm, dissection, vasculitis or significant stenosis. Inflow: Left external iliac artery stent appears widely patent. There are atherosclerotic calcifications throughout. There is a moderate focal stenosis secondary to noncalcified plaque in the distal aspect of the right external iliac artery which appears unchanged from prior. Veins: No obvious venous abnormality within the limitations of this arterial phase study. Review of the MIP images confirms the above findings. NON-VASCULAR Hepatobiliary: No focal liver abnormality is seen. No gallstones, gallbladder wall thickening, or biliary dilatation. Pancreas: Unremarkable. No pancreatic ductal dilatation or surrounding inflammatory changes. Spleen: Normal in size without focal abnormality. Adrenals/Urinary Tract: Adrenal glands are unremarkable. Kidneys are normal, without renal calculi, focal lesion, or hydronephrosis. Bladder is unremarkable. Stomach/Bowel: Stomach is within normal limits. Appendix is not seen. No evidence of bowel wall thickening, distention, or inflammatory changes. Lymphatic: No enlarged lymph nodes are seen. Reproductive: Status post hysterectomy. No adnexal masses. Other: There is a small fat containing umbilical hernia. There is no ascites. Musculoskeletal: Degenerative changes affect the spine and hips. Review of the MIP images confirms the above findings. IMPRESSION: 1. No evidence for aortic dissection or rupture. 2. 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 Recommend semi-annual imaging followup by CTA or MRA and referral to cardiothoracic surgery if not already obtained. This recommendation follows 2010 ACCF/AHA/AATS/ACR/ASA/SCA/SCAI/SIR/STS/SVM Guidelines for the Diagnosis and Management of Patients With Thoracic Aortic Disease. Circulation. 2010; 121: N829-F62. Aortic aneurysm NOS (ICD10-I71.9) 3. Stable cardiomegaly. 4. Stable left external iliac artery stent.  5. Stable moderate focal stenosis in the distal right external iliac artery. 6. Stable moderate focal stenosis in the proximal left renal  artery. 7. Minimal patchy ground-glass opacities in both upper lobes, likely infectious/inflammatory. Aortic Atherosclerosis (ICD10-I70.0). Electronically Signed   By: Darliss Cheney M.D.   On: 06/22/2022 15:49   DG Chest Portable 1 View  Result Date: 06/22/2022 CLINICAL DATA:  72 year old female with history of dyspnea. EXAM: PORTABLE CHEST 1 VIEW COMPARISON:  Chest x-ray 06/25/2020. FINDINGS: There is cephalization of the pulmonary vasculature and slight indistinctness of the interstitial markings suggestive of mild pulmonary edema. No definite pleural effusions. Mild cardiomegaly. The patient is rotated to the right on today's exam, resulting in distortion of the mediastinal contours and reduced diagnostic sensitivity and specificity for mediastinal pathology. Aortic stent graft noted. Right-sided pacemaker device in place with lead tips projecting over the expected location of the right atrium and right ventricle. IMPRESSION: 1. The appearance the chest suggests mild congestive heart failure, as above. 2. Postprocedural changes and support apparatus, as above. Electronically Signed   By: Trudie Reed M.D.   On: 06/22/2022 11:08    Scheduled Meds:  aspirin EC  81 mg Oral q morning   carvedilol  25 mg Oral BID WC   Chlorhexidine Gluconate Cloth  6 each Topical Daily   cloNIDine  0.2 mg Oral TID   clopidogrel  75 mg Oral Daily   furosemide  40 mg Intravenous BID   heparin  5,000 Units Subcutaneous Q8H   hydrALAZINE  100 mg Oral Q8H   hydrochlorothiazide  12.5 mg Oral Daily   insulin aspart  0-15 Units Subcutaneous TID WC   insulin aspart  0-5 Units Subcutaneous QHS   nicotine  14 mg Transdermal Daily   sertraline  100 mg Oral QHS   Continuous Infusions:  nitroGLYCERIN Stopped (06/22/22 2013)     LOS: 2 days   Burnadette Pop, MD [facility] P6/04/2022, 8:04 AM

## 2022-06-25 ENCOUNTER — Other Ambulatory Visit (HOSPITAL_COMMUNITY): Payer: Self-pay

## 2022-06-25 DIAGNOSIS — I169 Hypertensive crisis, unspecified: Secondary | ICD-10-CM | POA: Diagnosis not present

## 2022-06-25 LAB — BASIC METABOLIC PANEL
Anion gap: 11 (ref 5–15)
BUN: 37 mg/dL — ABNORMAL HIGH (ref 8–23)
CO2: 29 mmol/L (ref 22–32)
Calcium: 8.8 mg/dL — ABNORMAL LOW (ref 8.9–10.3)
Chloride: 95 mmol/L — ABNORMAL LOW (ref 98–111)
Creatinine, Ser: 1.74 mg/dL — ABNORMAL HIGH (ref 0.44–1.00)
GFR, Estimated: 31 mL/min — ABNORMAL LOW (ref >60)
Glucose, Bld: 154 mg/dL — ABNORMAL HIGH (ref 70–99)
Potassium: 3.4 mmol/L — ABNORMAL LOW (ref 3.5–5.1)
Sodium: 135 mmol/L (ref 135–145)

## 2022-06-25 LAB — GLUCOSE, CAPILLARY
Glucose-Capillary: 132 mg/dL — ABNORMAL HIGH (ref 70–99)
Glucose-Capillary: 163 mg/dL — ABNORMAL HIGH (ref 70–99)

## 2022-06-25 LAB — CBC
HCT: 41.2 % (ref 36.0–46.0)
Hemoglobin: 13.5 g/dL (ref 12.0–15.0)
MCH: 30.1 pg (ref 26.0–34.0)
MCHC: 32.8 g/dL (ref 30.0–36.0)
MCV: 92 fL (ref 80.0–100.0)
Platelets: 225 10*3/uL (ref 150–400)
RBC: 4.48 MIL/uL (ref 3.87–5.11)
RDW: 18.5 % — ABNORMAL HIGH (ref 11.5–15.5)
WBC: 8.9 10*3/uL (ref 4.0–10.5)
nRBC: 0 % (ref 0.0–0.2)

## 2022-06-25 MED ORDER — HYDROCHLOROTHIAZIDE 50 MG PO TABS
50.0000 mg | ORAL_TABLET | Freq: Every day | ORAL | 0 refills | Status: DC
  Filled 2022-06-25: qty 30, 30d supply, fill #0

## 2022-06-25 MED ORDER — POTASSIUM CHLORIDE CRYS ER 20 MEQ PO TBCR
40.0000 meq | EXTENDED_RELEASE_TABLET | Freq: Once | ORAL | Status: AC
  Administered 2022-06-25: 40 meq via ORAL
  Filled 2022-06-25: qty 2

## 2022-06-25 MED ORDER — HYDROXYZINE HCL 50 MG PO TABS: 50.0000 mg | ORAL_TABLET | Freq: Three times a day (TID) | ORAL | 0 refills | Status: AC | PRN

## 2022-06-25 MED ORDER — HYDRALAZINE HCL 100 MG PO TABS
100.0000 mg | ORAL_TABLET | Freq: Three times a day (TID) | ORAL | 0 refills | Status: AC
  Filled 2022-06-25: qty 90, 30d supply, fill #0

## 2022-06-25 MED ORDER — CLONIDINE HCL 0.3 MG PO TABS
0.3000 mg | ORAL_TABLET | Freq: Three times a day (TID) | ORAL | 0 refills | Status: DC
  Filled 2022-06-25: qty 90, 30d supply, fill #0

## 2022-06-25 MED ORDER — ISOSORBIDE MONONITRATE ER 60 MG PO TB24
60.0000 mg | ORAL_TABLET | Freq: Every day | ORAL | 0 refills | Status: DC
  Filled 2022-06-25: qty 30, 30d supply, fill #0

## 2022-06-25 MED ORDER — NICOTINE 14 MG/24HR TD PT24
14.0000 mg | MEDICATED_PATCH | Freq: Every day | TRANSDERMAL | 0 refills | Status: DC
  Filled 2022-06-25: qty 28, 28d supply, fill #0

## 2022-06-25 MED ORDER — HYDROCHLOROTHIAZIDE 25 MG PO TABS
25.0000 mg | ORAL_TABLET | Freq: Every day | ORAL | 0 refills | Status: DC
  Filled 2022-06-25: qty 30, 30d supply, fill #0

## 2022-06-25 MED ORDER — HYDROCHLOROTHIAZIDE 25 MG PO TABS
25.0000 mg | ORAL_TABLET | Freq: Every day | ORAL | Status: DC
  Administered 2022-06-25: 25 mg via ORAL
  Filled 2022-06-25: qty 1

## 2022-06-25 MED ORDER — HYDROCHLOROTHIAZIDE 25 MG PO TABS: 50.0000 mg | ORAL_TABLET | Freq: Every day | ORAL | Status: DC

## 2022-06-25 MED ORDER — POTASSIUM CHLORIDE CRYS ER 20 MEQ PO TBCR
40.0000 meq | EXTENDED_RELEASE_TABLET | Freq: Every day | ORAL | 0 refills | Status: DC
  Filled 2022-06-25: qty 6, 3d supply, fill #0

## 2022-06-25 NOTE — Care Management Important Message (Signed)
Important Message  Patient Details  Name: [name] MRN: [id number] Date of Birth: Feb 18, 1950   Medicare Important Message Given:  Yes     Renie Ora 06/25/2022, 8:53 AM

## 2022-06-25 NOTE — Progress Notes (Addendum)
SATURATION QUALIFICATIONS: (This note is used to comply with regulatory documentation for home oxygen)  Patient Saturations on Room Air at Rest = 93%  Patient Saturations on Room Air while Ambulating = 92%  Patient Saturations on 0 Liters of oxygen while Ambulating = 0%  Please briefly explain why patient needs home oxygen:n/a

## 2022-06-25 NOTE — TOC Transition Note (Signed)
Transition of Care [facility]) - CM/SW Discharge Note   Patient Details  Name: [name] MRN: [id number] Date of Birth: [birth year]  [facility] CM/SW Contact:  Leone Haven, RN Phone Number: 06/25/2022, 11:12 AM   Clinical Narrative:    Patient is for dc today, Staff RN Elliot Gurney to check to make sure she does not need any more oxygen prior to dc.           Patient Goals and CMS Choice      Discharge Placement                         Discharge Plan and Services Additional resources added to the After Visit Summary for                                       Social Determinants of Health (SDOH) Interventions SDOH Screenings   Food Insecurity: No Food Insecurity (06/24/2022)  Housing: Low Risk  (06/24/2022)  Transportation Needs: No Transportation Needs (06/24/2022)  Utilities: Not At Risk (06/24/2022)  Alcohol Screen: Low Risk  (06/24/2022)  Depression (PHQ2-9): Medium Risk (11/28/2019)  Financial Resource Strain: Low Risk  (06/24/2022)  Physical Activity: Unknown (11/28/2019)  Stress: Stress Concern Present (11/28/2019)  Tobacco Use: High Risk (06/24/2022)     Readmission Risk Interventions     No data to display

## 2022-06-25 NOTE — Discharge Summary (Signed)
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  On presentation ,she was hypoxic despite being on home oxygen regimen, wheezing, severely hypertensive with systolic blood pressure more than 220.  Was put briefly on BiPAP.  Patient was admitted under PCCM service for the management of hypertensive crisis .patient transferred to [facility] service on 6/4 after improvement in the blood pressure.  Hospital course also remarkable for AKI on CKD stage IIIb, persistent hypertension.  Blood pressure medications titrated.  Kidney function improved and is currently at baseline.  Medically stable for discharge home today.  Following problems were addressed during the hospitalization:  Acute on chronic hypoxic respiratory failure in the setting of CHF exacerbation on the background of hypertensive crisis/COPD exacerbation: Echo done here showed EF of 60 to 60%, grade 1 diastolic dysfunction.  Also showed severe concentric left ventricular hypertrophy.  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 Started on IV diuresis, BiPAP.  Currently weaned to nasal cannula.  She is currently on 2 L of oxygen per minute which is her baseline.   AKI in CKD stage IIIb: Baseline creatinine is 1.2-1.4.  Started on Lasix 40 mg twice daily.  Creatinine has trended up to 2.57 .  Stopped diuretic.   Now creatinine of 1.7 today, near baseline.  This morning she appears almost euvolemic.  She needs  to follow-up with nephrology as an outpatient.  Referral provided   Severe hypertension: Systolic blood pressure more than 220 on presentation.  Had to be started on Cleviprex , now stopped.  Home medications resumed. Increased the dose of clonidine, continue hydralazine, hydrochlorothiazide.  Added Imdur.  She has been instructed to monitor blood pressure closely at home and follow-up with PCP   COPD: Continue bronchodilators as needed.  Steroids stopped.    Hypokalemia: Supplemented with potassium.   Aortic aneurysm: Status post repair.  CT chest/abdomen/pelvis showed stable aortic aneurysm.  Negative for PE.   Diabetes type 2: Recent A1c of 7.  Continue home regimen,monitor blood sugars   Hypothyroidism: Continue Synthyroid   GERD: Continue PPI   History of DVT: Not on anticoagulation.   Depression/anxiety/chronic pain syndrome: On Zoloft, oxycodone   Leukocytosis: Unclear etiology.  Most likely reactive.  resolved    Discharge Diagnoses:  Principal Problem:   Hypertensive crisis    Discharge Instructions  Discharge Instructions     Ambulatory referral to Nephrology   Complete by: As directed    Diet - low sodium heart healthy   Complete by: As directed    Discharge instructions   Complete by: As directed    1)Pease take prescribed medications as instructed 2)Follow up with your PCP in a week.  Do a BMP test during the follow-up 3)Monitor your blood pressure at home 4) You have been referred to nephrology, you might be called for appointment.   Increase activity slowly   Complete by: As directed       Allergies as of 06/25/2022       Reactions   Amlodipine Swelling   Started very close to episode of angioedema to  ACE, unknown if this added effects or just residual   Lisinopril    angioedema   Losartan Swelling   Lip swelling   Codeine Phosphate Other (See Comments)   REACTION: UNKNOWN   Morphine And Codeine Other (See Comments)   Cannot take:Heart problems    Naproxen Nausea And Vomiting   Nsaids Nausea Only   Penicillins Other (See Comments)   UNKNOWN REACTION   Propoxyphene N-acetaminophen Other (See Comments)   Makes me gag   Sulfamethoxazole-trimethoprim Other (See Comments)   Gi upset   Flexeril [cyclobenzaprine] Anxiety, Other (See Comments)   Pt states medication gives her nightmares and insomnia.        Medication List     TAKE these medications    aspirin 81 MG tablet Take 81 mg by mouth every morning.   carvedilol 25 MG tablet Commonly known as: Coreg Take 1 tablet (25 mg total) by mouth 2 (two) times daily with a meal.   cholecalciferol 1000 units tablet Commonly known as: VITAMIN D Take 1,000 Units by mouth every morning.   cloNIDine 0.3 MG tablet Commonly known as: CATAPRES Take 1 tablet (0.3 mg total) by mouth 3 (three) times daily. What changed:  medication strength how much to take   clopidogrel 75 MG tablet Commonly known as: PLAVIX Take 75 mg by mouth daily.   clotrimazole-betamethasone cream Commonly known as: LOTRISONE Apply topically.   famotidine 20 MG tablet Commonly known as: PEPCID Take 20 mg by mouth daily.   gabapentin 100 MG capsule Commonly known as: NEURONTIN Take 1 capsule (100 mg total) by mouth 3 (three) times daily.   glimepiride 1 MG tablet Commonly known as: Amaryl Take 1 tablet (1 mg total) by mouth every morning.   hydrALAZINE 100 MG tablet Commonly known as: APRESOLINE Take 1 tablet (100 mg total) by mouth 3 (three) times daily.   hydrochlorothiazide 25 MG tablet Commonly known as: HYDRODIURIL Take 1 tablet (25 mg total) by mouth daily. Start taking on: June 26, 2022 What changed:  medication strength how much to take   hydrOXYzine 50 MG tablet Commonly known as: ATARAX Take 1 tablet (50 mg total) by mouth every 8 (eight) hours as needed. What changed:  when to take this reasons to take this   isosorbide mononitrate 60 MG 24 hr tablet Commonly known as:  IMDUR Take 1 tablet (60 mg total) by mouth daily.   levothyroxine 25 MCG tablet Commonly known as: SYNTHROID Take 1 tablet (25 mcg total) by mouth daily.   lovastatin 20 MG tablet Commonly known as: MEVACOR Take 20 mg by mouth at bedtime.   multivitamin tablet Take 1 tablet by mouth every morning.   nicotine 14 mg/24hr patch Commonly known as: NICODERM CQ - dosed in mg/24 hours Place 1 patch (14 mg total) onto the skin daily. Start taking on: June 26, 2022   Oxycodone HCl 10 MG Tabs Take 10 mg by mouth 4 (four) times daily as needed (pain).   OXYGEN Inhale 1 L into the lungs at bedtime.   pantoprazole 40 MG tablet Commonly known as: PROTONIX Take 1 tablet (40 mg total) by mouth daily.   polyethylene glycol 17 g packet Commonly known as: MIRALAX / GLYCOLAX Take 17 g by mouth daily.   potassium chloride SA 20 MEQ tablet Commonly known as: KLOR-CON M Take 2 tablets (40 mEq total) by mouth daily for 3 days. Start taking on: June 26, 2022   sertraline 100 MG tablet Commonly known as: ZOLOFT  Take 100 mg by mouth at bedtime.   Stiolto Respimat 2.5-2.5 MCG/ACT Aers Generic drug: Tiotropium Bromide-Olodaterol INHALE 2 PUFFS INTO THE LUNGS ONCE DAILY What changed: See the new instructions.   triamcinolone cream 0.1 % Commonly known as: KENALOG Apply topically 2 (two) times daily as needed (eczema).        Follow-up Information     [facility]. Go in 16 day(s).   Specialty: Cardiology Why: Hospital follow up 07/11/2022 @ 2 pm PLEASE bring a current medication list to appointment FREE valet parking, Entrance C, off [employer] information: [address] [id number] mc [place] [address] [phone]        [name], MD. Schedule an appointment as soon as possible for a visit in 1 week(s).   Specialty: Internal Medicine Contact information: [address] [place]  [address] [phone]                Allergies  Allergen Reactions   Amlodipine Swelling    Started very close to episode of angioedema to ACE, unknown if this added effects or just residual   Lisinopril     angioedema   Losartan Swelling    Lip swelling    Codeine Phosphate Other (See Comments)    REACTION: UNKNOWN   Morphine And Codeine Other (See Comments)    Cannot take:Heart problems   Naproxen Nausea And Vomiting   Nsaids Nausea Only   Penicillins Other (See Comments)    UNKNOWN REACTION   Propoxyphene N-Acetaminophen Other (See Comments)    Makes me gag   Sulfamethoxazole-Trimethoprim Other (See Comments)    Gi upset   Flexeril [Cyclobenzaprine] Anxiety and Other (See Comments)    Pt states medication gives her nightmares and insomnia.    Consultations: PCCM   Procedures/Studies: ECHOCARDIOGRAM COMPLETE  Result Date: 06/23/2022    ECHOCARDIOGRAM REPORT   Patient Name:   [name] [facility] Date of Exam: 06/23/2022 Medical Rec #:  [id number]           Height:       69.0 in Accession #:    [id number]          Weight:       193.8 lb Date of Birth:  1950-10-26          BSA:          2.038 m Patient Age:    71 years            BP:           152/79 mmHg Patient Gender: F                   HR:           70 bpm. Exam Location:  Inpatient Procedure: 2D Echo, 3D Echo, Cardiac Doppler, Color Doppler and Strain Analysis Indications:    R06.03 Respiratory distress  History:        Patient has prior history of Echocardiogram examinations, most                 recent 01/29/2020. COPD, Arrythmias:Atrial Fibrillation; Risk                 Factors:Current Smoker and Hypertension.  Sonographer:    Dondra Prader RVT RCS Referring Phys: [id number] BRADLEY L ICARD IMPRESSIONS  1. Left ventricular ejection fraction, by estimation, is 60 to 65%. The left ventricle has normal function. The left ventricle has  no regional wall motion abnormalities. There is moderate- severe concentric left ventricular  hypertrophy. GLS -11.9%. Grade I diastolic dsyfunction. Due to severity of LVH and low GLS, consider evaluation for infiltrative cardiomyopathy.  2. Right ventricular systolic function is normal. The right ventricular size is normal.  3. The mitral valve is normal in structure. No evidence of mitral valve regurgitation. No evidence of mitral stenosis. Moderate mitral annular calcification.  4. The aortic valve is normal in structure. There is moderate calcification of the aortic valve. Aortic valve regurgitation is not visualized. No aortic stenosis is present.  5. The inferior vena cava is normal in size with greater than 50% respiratory variability, suggesting right atrial pressure of 3 mmHg. FINDINGS  Left Ventricle: Left ventricular ejection fraction, by estimation, is 60 to 65%. The left ventricle has normal function. The left ventricle has no regional wall motion abnormalities. The left ventricular internal cavity size was normal in size. There is  severe concentric left ventricular hypertrophy. Left ventricular diastolic parameters are consistent with Grade I diastolic dysfunction (impaired relaxation). Right Ventricle: The right ventricular size is normal. No increase in right ventricular wall thickness. Right ventricular systolic function is normal. Left Atrium: Left atrial size was normal in size. Right Atrium: Right atrial size was normal in size. Pericardium: There is no evidence of pericardial effusion. Mitral Valve: The mitral valve is normal in structure. Moderate mitral annular calcification. No evidence of mitral valve regurgitation. No evidence of mitral valve stenosis. Tricuspid Valve: The tricuspid valve is normal in structure. Tricuspid valve regurgitation is not demonstrated. No evidence of tricuspid stenosis. Aortic Valve: The aortic valve is normal in structure. There is moderate calcification of the aortic valve. Aortic valve regurgitation is not visualized. No aortic stenosis is present.  Aortic valve mean gradient measures 12.0 mmHg. Aortic valve peak gradient measures 19.9 mmHg. Aortic valve area, by VTI measures 2.02 cm. Pulmonic Valve: The pulmonic valve was normal in structure. Pulmonic valve regurgitation is mild. No evidence of pulmonic stenosis. Aorta: The aortic root is normal in size and structure. Venous: The inferior vena cava is normal in size with greater than 50% respiratory variability, suggesting right atrial pressure of 3 mmHg. IAS/Shunts: No atrial level shunt detected by color flow Doppler. Additional Comments: A device lead is visualized.  LEFT VENTRICLE PLAX 2D LVIDd:         3.60 cm   Diastology LVIDs:         2.30 cm   LV e' medial:    6.20 cm/s LV PW:         1.70 cm   LV E/e' medial:  12.9 LV IVS:        1.50 cm   LV e' lateral:   6.20 cm/s LVOT diam:     1.80 cm   LV E/e' lateral: 12.9 LV SV:         99 LV SV Index:   49 LVOT Area:     2.54 cm                           3D Volume EF:                          3D EF:        53 %  LV EDV:       111 ml                          LV ESV:       52 ml                          LV SV:        59 ml RIGHT VENTRICLE            IVC RV Basal diam:  3.70 cm    IVC diam: 1.50 cm RV S prime:     9.46 cm/s TAPSE (M-mode): 3.6 cm LEFT ATRIUM             Index        RIGHT ATRIUM           Index LA diam:        3.40 cm 1.67 cm/m   RA Area:     22.10 cm LA Vol (A2C):   56.6 ml 27.77 ml/m  RA Volume:   69.10 ml  33.90 ml/m LA Vol (A4C):   58.4 ml 28.65 ml/m LA Biplane Vol: 60.9 ml 29.88 ml/m  AORTIC VALVE                     PULMONIC VALVE AV Area (Vmax):    1.97 cm      PV Vmax:       0.95 m/s AV Area (Vmean):   1.88 cm      PV Peak grad:  3.6 mmHg AV Area (VTI):     2.02 cm AV Vmax:           223.00 cm/s AV Vmean:          165.000 cm/s AV VTI:            0.491 m AV Peak Grad:      19.9 mmHg AV Mean Grad:      12.0 mmHg LVOT Vmax:         173.00 cm/s LVOT Vmean:        122.000 cm/s LVOT VTI:          0.389 m  LVOT/AV VTI ratio: 0.79  AORTA Ao Root diam: 2.90 cm Ao Asc diam:  3.70 cm MITRAL VALVE MV Area (PHT): 3.03 cm    SHUNTS MV Decel Time: 250 msec    Systemic VTI:  0.39 m MV E velocity: 79.70 cm/s  Systemic Diam: 1.80 cm MV A velocity: 95.90 cm/s MV E/A ratio:  0.83 Aditya Sabharwal Electronically signed by Dorthula Nettles Signature Date/Time: 06/23/2022/11:11:20 AM    Final    CT Angio Chest/Abd/Pel for Dissection W and/or Wo Contrast  Result Date: 06/22/2022 CLINICAL DATA:  Acute aortic syndrome suspected EXAM: CT ANGIOGRAPHY CHEST, ABDOMEN AND PELVIS TECHNIQUE: Non-contrast CT of the chest was initially obtained. Multidetector CT imaging through the chest, abdomen and pelvis was performed using the standard protocol during bolus administration of intravenous contrast. Multiplanar reconstructed images and MIPs were obtained and reviewed to evaluate the vascular anatomy. RADIATION DOSE REDUCTION: This exam was performed according to the departmental dose-optimization program which includes automated exposure control, adjustment of the mA and/or kV according to patient size and/or use of iterative reconstruction technique. CONTRAST:  75mL OMNIPAQUE IOHEXOL 350 MG/ML SOLN COMPARISON:  CT angiogram chest abdomen and pelvis 07/11/2020. CT chest abdomen and pelvis 03/06/2021. FINDINGS: CTA CHEST FINDINGS Cardiovascular:  There is adequate opacification of the aorta. There is stable mild aneurysmal dilatation of the ascending aorta measuring 4.1 cm. Descending thoracic aortic graft is again noted which appears uncomplicated. There is no evidence for dissection or periaortic fluid/stranding. There is no central pulmonary embolism. The heart is enlarged. Right-sided pacemaker is present. There are atherosclerotic calcifications of the aorta. Mediastinum/Nodes: No enlarged mediastinal, hilar, or axillary lymph nodes. Thyroid gland, trachea, and esophagus demonstrate no significant findings. Lungs/Pleura: There is dependent  atelectasis in the bilateral lower lobes. There are minimal patchy ground-glass opacities in both upper lobes. There is no pleural effusion or pneumothorax. Musculoskeletal: No acute fracture. Review of the MIP images confirms the above findings. CTA ABDOMEN AND PELVIS FINDINGS VASCULAR Aorta: Normal caliber aorta without aneurysm, dissection, vasculitis or significant stenosis. There are atherosclerotic calcifications of the aorta. Celiac: Patent without evidence of aneurysm, dissection, vasculitis or significant stenosis. SMA: Patent without evidence of aneurysm, dissection, vasculitis or significant stenosis. Renals: There is moderate focal stenosis in the proximal left renal artery. The right renal artery is within normal limits. IMA: Patent without evidence of aneurysm, dissection, vasculitis or significant stenosis. Inflow: Left external iliac artery stent appears widely patent. There are atherosclerotic calcifications throughout. There is a moderate focal stenosis secondary to noncalcified plaque in the distal aspect of the right external iliac artery which appears unchanged from prior. Veins: No obvious venous abnormality within the limitations of this arterial phase study. Review of the MIP images confirms the above findings. NON-VASCULAR Hepatobiliary: No focal liver abnormality is seen. No gallstones, gallbladder wall thickening, or biliary dilatation. Pancreas: Unremarkable. No pancreatic ductal dilatation or surrounding inflammatory changes. Spleen: Normal in size without focal abnormality. Adrenals/Urinary Tract: Adrenal glands are unremarkable. Kidneys are normal, without renal calculi, focal lesion, or hydronephrosis. Bladder is unremarkable. Stomach/Bowel: Stomach is within normal limits. Appendix is not seen. No evidence of bowel wall thickening, distention, or inflammatory changes. Lymphatic: No enlarged lymph nodes are seen. Reproductive: Status post hysterectomy. No adnexal masses. Other: There  is a small fat containing umbilical hernia. There is no ascites. Musculoskeletal: Degenerative changes affect the spine and hips. Review of the MIP images confirms the above findings. IMPRESSION: 1. No evidence for aortic dissection or rupture. 2. 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 Recommend semi-annual imaging followup by CTA or MRA and referral to cardiothoracic surgery if not already obtained. This recommendation follows 2010 ACCF/AHA/AATS/ACR/ASA/SCA/SCAI/SIR/STS/SVM Guidelines for the Diagnosis and Management of Patients With Thoracic Aortic Disease. Circulation. 2010; 121: Z610-R60. Aortic aneurysm NOS (ICD10-I71.9) 3. Stable cardiomegaly. 4. Stable left external iliac artery stent. 5. Stable moderate focal stenosis in the distal right external iliac artery. 6. Stable moderate focal stenosis in the proximal left renal artery. 7. Minimal patchy ground-glass opacities in both upper lobes, likely infectious/inflammatory. Aortic Atherosclerosis (ICD10-I70.0). Electronically Signed   By: Darliss Cheney M.D.   On: 06/22/2022 15:49   DG Chest Portable 1 View  Result Date: 06/22/2022 CLINICAL DATA:  71 year old female with history of dyspnea. EXAM: PORTABLE CHEST 1 VIEW COMPARISON:  Chest x-ray 06/25/2020. FINDINGS: There is cephalization of the pulmonary vasculature and slight indistinctness of the interstitial markings suggestive of mild pulmonary edema. No definite pleural effusions. Mild cardiomegaly. The patient is rotated to the right on today's exam, resulting in distortion of the mediastinal contours and reduced diagnostic sensitivity and specificity for mediastinal pathology. Aortic stent graft noted. Right-sided pacemaker device in place with lead tips projecting over the expected location of the right atrium and right  ventricle. IMPRESSION: 1. The appearance the chest suggests mild congestive heart failure, as above. 2. Postprocedural changes and support apparatus, as above.  Electronically Signed   By: Trudie Reed M.D.   On: 06/22/2022 11:08   CUP PACEART REMOTE DEVICE CHECK  Result Date: 06/12/2022 Monthly battery check.  Estimated <27mo Normal device function. 2 AHR's, brief atrial lead noise, known Follow up as scheduled monthly LA, CVRS     Subjective: Patient seen and examined at bedside today.  Hemodynamically stable.  Remains on baseline oxygen requirement, denies any shortness of breath or cough.  Lungs are clear to auscultation.  Blood pressure medication titrated.  Discharge planning discussed at bedside.  Medically stable for discharge  Discharge Exam: Vitals:   06/25/22 0847 06/25/22 1000  BP: (!) 174/92 (!) 163/102  Pulse:    Resp: 20   Temp:    SpO2:     Vitals:   06/25/22 0400 06/25/22 0758 06/25/22 0847 06/25/22 1000  BP: (!) 179/110 (!) 176/113 (!) 174/92 (!) 163/102  Pulse: 70 73    Resp:  12 20   Temp: 98.1 F (36.7 C) 98.1 F (36.7 C)    TempSrc: Oral Oral    SpO2: 100%     Weight: 83.1 kg     Height:        General: Pt is alert, awake, not in acute distress Cardiovascular: RRR, S1/S2 +, no rubs, no gallops Respiratory: CTA bilaterally, no wheezing, no rhonchi Abdominal: Soft, NT, ND, bowel sounds + Extremities: no edema, no cyanosis    The results of significant diagnostics from this hospitalization (including imaging, microbiology, ancillary and laboratory) are listed below for reference.     Microbiology: Recent Results (from the past 240 hour(s))  MRSA Next Gen by PCR, Nasal     Status: None   Collection Time: 06/22/22  6:40 PM   Specimen: Nasal Mucosa; Nasal Swab  Result Value Ref Range Status   MRSA by PCR Next Gen NOT DETECTED NOT DETECTED Final    Comment: (NOTE) The GeneXpert MRSA Assay (FDA approved for NASAL specimens only), is one component of a comprehensive MRSA colonization surveillance program. It is not intended to diagnose MRSA infection nor to guide or monitor treatment for MRSA  infections. Test performance is not FDA approved in patients less than 47 years old. Performed at [facility], [address], [place], [place] [address]      Labs: BNP (last 3 results) Recent Labs    12/12/21 2000 06/22/22 1038  BNP 353.0* 288.0*   Basic Metabolic Panel: Recent Labs  Lab 06/22/22 1038 06/22/22 1837 06/23/22 0249 06/24/22 0110 06/25/22 0125  NA 137  --  136 137 135  K 3.9  --  4.1 3.2* 3.4*  CL 100  --  103 98 95*  CO2 29  --  23 28 29   GLUCOSE 157*  --  179* 182* 154*  BUN 28*  --  33* 44* 37*  CREATININE 1.79* 1.74* 2.26* 2.57* 1.74*  CALCIUM 9.0  --  8.6* 8.7* 8.8*  MG  --   --  2.1 2.1  --    Liver Function Tests: Recent Labs  Lab 06/22/22 1038  AST 30  ALT 18  ALKPHOS 86  BILITOT 0.7  PROT 7.9  ALBUMIN 3.9   No results for input(s): "LIPASE", "AMYLASE" in the last 168 hours. No results for input(s): "AMMONIA" in the last 168 hours. CBC: Recent Labs  Lab 06/22/22 1038 06/22/22 1837 06/23/22  0249 06/25/22 0125  WBC 9.6 13.3* 13.1* 8.9  NEUTROABS 7.1  --  11.8*  --   HGB 13.1 13.9 12.6 13.5  HCT 40.6 42.9 39.0 41.2  MCV 92.3 92.7 90.9 92.0  PLT 239 254 245 225   Cardiac Enzymes: No results for input(s): "CKTOTAL", "CKMB", "CKMBINDEX", "TROPONINI" in the last 168 hours. BNP: Invalid input(s): "POCBNP" CBG: Recent Labs  Lab 06/24/22 0722 06/24/22 1130 06/24/22 1720 06/24/22 2135 06/25/22 0609  GLUCAP 105* 135* 84 132* 163*   D-Dimer No results for input(s): "DDIMER" in the last 72 hours. Hgb A1c Recent Labs    06/22/22 1837  HGBA1C 7.0*   Lipid Profile Recent Labs    06/24/22 0110  TRIG 75   Thyroid function studies No results for input(s): "TSH", "T4TOTAL", "T3FREE", "THYROIDAB" in the last 72 hours.  Invalid input(s): "FREET3" Anemia work up No results for input(s): "VITAMINB12", "FOLATE", "FERRITIN", "TIBC", "IRON", "RETICCTPCT" in the last 72 hours. Urinalysis    Component Value Date/Time    COLORURINE YELLOW 06/25/2020 0919   APPEARANCEUR CLEAR 06/25/2020 0919   LABSPEC 1.012 06/25/2020 0919   PHURINE 6.0 06/25/2020 0919   GLUCOSEU 50 (A) 06/25/2020 0919   GLUCOSEU NEGATIVE 02/16/2008 1531   HGBUR NEGATIVE 06/25/2020 0919   BILIRUBINUR NEGATIVE 06/25/2020 0919   KETONESUR NEGATIVE 06/25/2020 0919   PROTEINUR 30 (A) 06/25/2020 0919   UROBILINOGEN 0.2 02/19/2011 1642   NITRITE NEGATIVE 06/25/2020 0919   LEUKOCYTESUR NEGATIVE 06/25/2020 0919   Sepsis Labs Recent Labs  Lab 06/22/22 1038 06/22/22 1837 06/23/22 0249 06/25/22 0125  WBC 9.6 13.3* 13.1* 8.9   Microbiology Recent Results (from the past 240 hour(s))  MRSA Next Gen by PCR, Nasal     Status: None   Collection Time: 06/22/22  6:40 PM   Specimen: Nasal Mucosa; Nasal Swab  Result Value Ref Range Status   MRSA by PCR Next Gen NOT DETECTED NOT DETECTED Final    Comment: (NOTE) The GeneXpert MRSA Assay (FDA approved for NASAL specimens only), is one component of a comprehensive MRSA colonization surveillance program. It is not intended to diagnose MRSA infection nor to guide or monitor treatment for MRSA infections. Test performance is not FDA approved in patients less than 54 years old. Performed at [facility], [address] [place], [place] [address]     Please note: You were cared for by a hospitalist during your hospital stay. Once you are discharged, your primary care physician will handle any further medical issues. Please note that NO REFILLS for any discharge medications will be authorized once you are discharged, as it is imperative that you return to your primary care physician (or establish a relationship with a primary care physician if you do not have one) for your post hospital discharge needs so that they can reassess your need for medications and monitor your lab values.    Time coordinating discharge: 40 minutes  SIGNED:   Burnadette Pop, MD  [facility] 06/25/2022,  10:50 AM Pager [phone]  If 7PM-7AM, please contact night-coverage [email or web address] Password [email or web address]

## 2022-06-25 NOTE — Progress Notes (Signed)
   06/25/22 0000  BiPAP/CPAP/SIPAP  Reason BIPAP/CPAP not in use Other(comment) (PRN)   Patient is satting 98% on 2L [place] and in no distress, BIPAP order is prn and not indicated at this time.

## 2022-06-25 NOTE — Progress Notes (Signed)
   Heart Failure Stewardship Pharmacist Progress Note   PCP: [name], MD PCP-Cardiologist: None    HPI:  72 yo F with PMH of CHF, aortic aneurysm, DVT, HTN, T2DM, and COPD.  She presented to the ED on 6/2 with shortness of breath and hypertensive with SBP >220. PCCM consulted for management of HTN crisis. Started on NTG and cleviprex with PTA carvedilol and hydralazine. CXR showed pulmonary edema. CTA without aortic dissection or rupture, stable cardiomegaly. ECHO 6/3 showed LVEF 60-65%, moderate to severe concentric LVH, no regional wall motion abnormalities, G1DD. Given the severity of LVH and low GLS, consider evaluation for infiltrative cardiomyopathy. Cleviprex and NTG now stopped.   Current HF Medications: Beta Blocker: carvedilol 25 mg BID Other: hydralazine 100 mg TID *also on clonidine 0.3 mg TID and HCTZ 25 mg daily  Prior to admission HF Medications: Beta blocker: carvedilol 25 mg BID Other: hydralazine 100 mg TID *also on clonidine 0.2 mg TID and HCTZ 12.5 mg daily  Pertinent Lab Values: Serum creatinine 1.74, BUN 37, Potassium 3.4, Sodium 135, BNP 288, Magnesium 2.1, A1c 7.0   Vital Signs: Weight: 183 lbs (admission weight: 189 lbs) Blood pressure: 170/90s  Heart rate: 70s  I/O: -2L yesterday; net -2.8L  Medication Assistance / Insurance Benefits Check: Does the patient have prescription insurance?  Yes Type of insurance plan: [facility] Medicare  Outpatient Pharmacy:  Prior to admission outpatient pharmacy: [facility] Is the patient willing to use [facility] TOC pharmacy at discharge? Yes Is the patient willing to transition their outpatient pharmacy to utilize a [facility] outpatient pharmacy?   Pending    Assessment: 1. Acute on chronic diastolic CHF (LVEF 60-65%). NYHA class II symptoms. - Off IV lasix. SCr improved from 2.57>1.74 today. BL ~1.4. Strict I/Os and daily weights. Keep K>4 and Mg>2. KCl 40 mEq x 1 given for replacement.  - Continue carvedilol 25 mg  BID - No ACE/ARB/ARNI with history of angioedema from lisinopril - No MRA with AKI - improving - Continue hydralazine 100 mg TID - Consider adding Imdur - Continue clonidine 0.3 mg TID and HCTZ 25 mg daily (dose increased today) - No amlodipine with swelling - patient states this was very close to episode of angioedema  Plan: 1) Medication changes recommended at this time: - Add Imdur 60 mg daily  2) Patient assistance: - None pending  3)  Education  - Patient has been educated on current HF medications and potential additions to HF medication regimen - Patient verbalizes understanding that over the next few months, these medication doses may change and more medications may be added to optimize HF regimen - Patient has been educated on basic disease state pathophysiology and goals of therapy   Sharen Hones, PharmD, BCPS Heart Failure Stewardship Pharmacist Phone [phone]

## 2022-06-25 NOTE — TOC Initial Note (Addendum)
Transition of Care [facility]) - Initial/Assessment Note    Patient Details  Name: [name] MRN: [id number] Date of Birth: December 10, 1950  [facility] CM/SW Contact:    Kristy Haven, RN Phone Number: 06/25/2022, 11:02 AM  Clinical Narrative:                 From home alone, she still drives herself to [place] apts.  She has a PCP on file and insurance with medication coverage.  She states she does not have any HH in place at this time but she needs someone to help with groceries. This NCM will give her a PCS form to take to her PCP MD to fill out and send to [facility].  She states she has no clothes here , [name], CSW will look thru the closet to see if we have something that she will be able to go home in.  Patient states she has transportation but she will have to call them, she lives in [place], her neighbor , [name] is her support sytem but he is working right now , he drives a [employer], [employer].  She states she gets her meds from [facility] in [place].  She states she has home oxygen with [employer], 1 liter that she wears at night.  NCM informed Staff RN [name] to check to make sure she does not need any more oxygen prior to dc.         Patient Goals and CMS Choice            Expected Discharge Plan and Services         Expected Discharge Date: 06/25/22                                    Prior Living Arrangements/Services                       Activities of Daily Living Home Assistive Devices/Equipment: Dentures (specify type), Oxygen ADL Screening (condition at time of admission) Patient's cognitive ability adequate to safely complete daily activities?: Yes Is the patient deaf or have difficulty hearing?: No Does the patient have difficulty seeing, even when wearing glasses/contacts?: No Does the patient have difficulty concentrating, remembering, or making decisions?: No Patient able to express need for assistance with ADLs?: Yes Does  the patient have difficulty dressing or bathing?: No Independently performs ADLs?: Yes (appropriate for developmental age) Does the patient have difficulty walking or climbing stairs?: Yes Weakness of Legs: Both Weakness of Arms/Hands: None  Permission Sought/Granted                  Emotional Assessment              Admission diagnosis:  Hypertensive crisis [I16.9] Dyspnea, unspecified type [R06.00] Patient Active Problem List   Diagnosis Date Noted   Hypertensive crisis 06/22/2022   Acute respiratory failure with hypoxia and hypercarbia (HCC) 06/25/2020   COPD with acute exacerbation (HCC) 06/25/2020   Angioedema 06/25/2020   Stroke-like symptoms 01/30/2020   COVID-19 virus infection    Numbness and tingling of left lower extremity 01/29/2020   Paroxysmal atrial fibrillation (HCC) 01/29/2020   COPD (chronic obstructive pulmonary disease) (HCC)    AKI (acute kidney injury) (HCC)    Encounter for central line placement    Hypotension due to drugs    Hypoxia    Dissection of thoracic  aorta (HCC)    Intramural aortic hematoma (HCC) 08/28/2019   Aortic aneurysm (HCC) 08/28/2019   Hypokalemia 05/06/2017   Abdominal pain 05/06/2017   Chronic atrial fibrillation (HCC) 03/21/2014   Enlarged lymph node 12/29/2011   OTHER&UNSPECIFIED DISEASES THE ORAL SOFT TISSUES 08/17/2008   GOUT 07/10/2008   ANKLE PAIN, RIGHT 07/10/2008   ASTHMATIC BRONCHITIS, ACUTE 05/10/2008   ABDOMINAL PAIN, UNSPECIFIED SITE 02/16/2008   KNEE PAIN, BILATERAL 06/17/2007   DEPRESSIVE DISORDER 12/25/2006   Pain in limb 12/25/2006   Lipoma of unspecified site 09/17/2006   Hypothyroidism 09/17/2006   Type 2 diabetes mellitus with hyperlipidemia (HCC) 09/17/2006   OBSTRUCTIVE SLEEP APNEA 09/17/2006   Hypertension associated with diabetes (HCC) 09/17/2006   BRADYCARDIA, CHRONIC 09/17/2006   HERNIA, SITE NOS W/O OBSTRUCTION/GANGRENE 09/17/2006   DEGENERATIVE JOINT DISEASE, HANDS 09/17/2006    PPM-[employer]tr[employer] 09/17/2006   PCP:  Kristy Livings, MD Pharmacy:   [facility]) - [place], [place] - [address] [facility] [address] [place] [address] [place] [address] Phone: [phone] Fax: [phone]  [facility] - [place], [place] - [address] [place] [address] Phone: [phone] Fax: [phone]  [facility] [address] [place] [address] Phone: [phone] Fax: [phone]     Social Determinants of Health (SDOH) Social History: SDOH Screenings   Food Insecurity: No Food Insecurity (06/24/2022)  Housing: Low Risk  (06/24/2022)  Transportation Needs: No Transportation Needs (06/24/2022)  Utilities: Not At Risk (06/24/2022)  Alcohol Screen: Low Risk  (06/24/2022)  Depression (PHQ2-9): Medium Risk (11/28/2019)  Financial Resource Strain: Low Risk  (06/24/2022)  Physical Activity: Unknown (11/28/2019)  Stress: Stress Concern Present (11/28/2019)  Tobacco Use: High Risk (06/24/2022)   SDOH Interventions: Food Insecurity Interventions: Intervention Not Indicated Housing Interventions: Intervention Not Indicated Transportation Interventions: Intervention Not Indicated Utilities Interventions: Intervention Not Indicated Alcohol Usage Interventions: Intervention Not Indicated (Score <7) Financial Strain Interventions: Intervention Not Indicated   Readmission Risk Interventions     No data to display

## 2022-07-02 NOTE — Progress Notes (Signed)
Remote pacemaker transmission.   

## 2022-07-03 ENCOUNTER — Encounter (HOSPITAL_COMMUNITY): Payer: Self-pay

## 2022-07-03 ENCOUNTER — Ambulatory Visit (HOSPITAL_COMMUNITY)
Admission: RE | Admit: 2022-07-03 | Discharge: 2022-07-03 | Disposition: A | Discharge status: Home / Self Care | Payer: 59 | Source: Ambulatory Visit | Attending: Surgery | Admitting: Surgery

## 2022-07-03 DIAGNOSIS — I712 Thoracic aortic aneurysm, without rupture, unspecified: Secondary | ICD-10-CM

## 2022-07-08 ENCOUNTER — Ambulatory Visit (INDEPENDENT_AMBULATORY_CARE_PROVIDER_SITE_OTHER): Payer: 59 | Admitting: Adult Health

## 2022-07-08 ENCOUNTER — Encounter: Payer: Self-pay | Admitting: Adult Health

## 2022-07-08 VITALS — BP 130/76 | HR 77 | Temp 98.3°F | Ht 65.5 in | Wt 187.6 lb

## 2022-07-08 DIAGNOSIS — J441 Chronic obstructive pulmonary disease with (acute) exacerbation: Secondary | ICD-10-CM

## 2022-07-08 DIAGNOSIS — I152 Hypertension secondary to endocrine disorders: Secondary | ICD-10-CM

## 2022-07-08 DIAGNOSIS — E1159 Type 2 diabetes mellitus with other circulatory complications: Secondary | ICD-10-CM | POA: Diagnosis not present

## 2022-07-08 DIAGNOSIS — J9611 Chronic respiratory failure with hypoxia: Secondary | ICD-10-CM | POA: Insufficient documentation

## 2022-07-08 DIAGNOSIS — I509 Heart failure, unspecified: Secondary | ICD-10-CM

## 2022-07-08 DIAGNOSIS — J449 Chronic obstructive pulmonary disease, unspecified: Secondary | ICD-10-CM

## 2022-07-08 NOTE — Assessment & Plan Note (Signed)
Recent hospitalization for hypertensive crisis.  Blood pressure appears under good control today.  Continue follow-up with cardiology

## 2022-07-08 NOTE — Assessment & Plan Note (Signed)
Recent COPD exacerbation from decompensated heart failure.  Patient appears improved after hospitalization requiring BiPAP and aggressive diuresis.  Continue follow-up with cardiology.  Continue on Stiolto.  Needs PFTs on return.  Encouraged on smoking cessation  Plan Patient Instructions  Continue Stiolto 2 puffs once a day every day.  I am glad this is helped some. Continue to use Oxygen 2l/m with activity and At bedtime  , goal is to keep O2 sats >88-90%.  Work on quitting smoking .  Set up for PFT.  Follow up with Dr. Judeth Horn in 4 weeks as planned and As needed   Please contact office for sooner follow up if symptoms do not improve or worsen or seek emergency care

## 2022-07-08 NOTE — Assessment & Plan Note (Addendum)
Continue on oxygen to maintain O2 saturations greater than 88 to 90%. Order for POC.  

## 2022-07-08 NOTE — Progress Notes (Signed)
@Patient  ID: [name], female    DOB: 11/10/1950, 72 y.o.   MRN: [id number]  Chief Complaint  Patient presents with   Hospitalization Follow-up    Referring provider: [name], MD  HPI: 72 year old female active smoker followed for COPD (PFTs pending) and chronic respiratory failure Medical history significant for congestive heart failure, aortic aneurysm, status post pacemaker, DVT, diabetes, AAA, chronic kidney disease  TEST/EVENTS :   07/08/2022 Follow up : COPD, O2 RF , Post hospital follow up  Patient returns for a posthospital follow-up.  Patient has underlying COPD, PFTs are pending and chronic respiratory failure.  She was recently hospitalized earlier this month for COPD exacerbation complicated by CHF decompensation and hypertensive crisis.  She required initial BiPAP support.  Improved with aggressive diuresis along with blood pressure management.  Was treated with brief steroids.  CT chest showed stable thoracic aortic aneurysm measuring 4.1 cm, minimum groundglass opacities in the upper lobes likely inflammatory/infectious.  2D echo showed EF at 60 to 65%, moderate to severe left ventricular hypertrophy, grade 1 diastolic dysfunction RV size normal.  Moderate calcification of the aortic valve.  Since discharge patient is feeling better with decreased dyspnea. Does feel tired and low energy. Gets winded with prolonged walking. No increased edema.  She remains on Stiolto daily. Discharged on Oxygen 2l/m . Using Oxygen 2l/m with activity and At bedtime   Previously on Oxygen 1l/m At bedtime  prior to bedtime.  Today in office O2 sats 99% on room air at rest , walk test shows oxygen levels drop down to 83% on room air required 2 L of oxygen to maintain O2 saturations greater than 88 to 90%.  Also required 2 L of pulsed on POC to keep O2 saturations greater than 88 to 90%.     Allergies  Allergen Reactions   Amlodipine Swelling    Started very close to episode of  angioedema to ACE, unknown if this added effects or just residual   Lisinopril     angioedema   Losartan Swelling    Lip swelling    Codeine Phosphate Other (See Comments)    REACTION: UNKNOWN   Morphine And Codeine Other (See Comments)    Cannot take:Heart problems   Naproxen Nausea And Vomiting   Nsaids Nausea Only   Penicillins Other (See Comments)    UNKNOWN REACTION   Propoxyphene N-Acetaminophen Other (See Comments)    Makes me gag   Sulfamethoxazole-Trimethoprim Other (See Comments)    Gi upset   Flexeril [Cyclobenzaprine] Anxiety and Other (See Comments)    Pt states medication gives her nightmares and insomnia.    Immunization History  Administered Date(s) Administered   H1N1 02/16/2008   Influenza Whole 11/22/2008   PFIZER(Purple Top)SARS-COV-2 Vaccination 06/25/2019, 08/22/2019   PNEUMOCOCCAL CONJUGATE-20 06/25/2022   Pfizer Covid-19 Vaccine Bivalent Booster 50yrs & up 02/24/2020, 09/21/2020   Pneumococcal Polysaccharide-23 05/21/2006    Past Medical History:  Diagnosis Date   Anemia    Anxiety    Arthritis    Atrial fib/flutter, transient (HCC)    Cataract    COPD (chronic obstructive pulmonary disease) (HCC)    Depression    Diabetes mellitus    Diverticulosis    DJD (degenerative joint disease)    GERD (gastroesophageal reflux disease)    Gout    History of pneumonia    Hypertension    Hypothyroidism    Internal hemorrhoids     Tobacco History: Social History   Tobacco Use  Smoking Status Every Day   Packs/day: 2.00   Years: 54.00   Additional pack years: 0.00   Total pack years: 108.00   Types: Cigarettes   Passive exposure: Never  Smokeless Tobacco Never  Tobacco Comments   3 cigarettes daily.  Trying to quit.  07/08/2022 hfb   Ready to quit: Not Answered Counseling given: Not Answered Tobacco comments: 3 cigarettes daily.  Trying to quit.  07/08/2022 hfb   Outpatient Medications Prior to Visit  Medication Sig Dispense Refill    aspirin 81 MG tablet Take 81 mg by mouth every morning.      carvedilol (COREG) 25 MG tablet Take 1 tablet (25 mg total) by mouth 2 (two) times daily with a meal. 180 tablet 3   cholecalciferol (VITAMIN D) 1000 UNITS tablet Take 1,000 Units by mouth every morning.      cloNIDine (CATAPRES) 0.3 MG tablet Take 1 tablet (0.3 mg total) by mouth 3 (three) times daily. 90 tablet 0   clopidogrel (PLAVIX) 75 MG tablet Take 75 mg by mouth daily.     clotrimazole-betamethasone (LOTRISONE) cream Apply topically.     famotidine (PEPCID) 20 MG tablet Take 20 mg by mouth daily.     gabapentin (NEURONTIN) 100 MG capsule Take 1 capsule (100 mg total) by mouth 3 (three) times daily. 90 capsule 2   hydrALAZINE (APRESOLINE) 100 MG tablet Take 1 tablet (100 mg total) by mouth 3 (three) times daily. 90 tablet 0   hydrochlorothiazide (HYDRODIURIL) 25 MG tablet Take 1 tablet (25 mg total) by mouth daily. 30 tablet 0   hydrOXYzine (ATARAX) 50 MG tablet Take 1 tablet (50 mg total) by mouth every 8 (eight) hours as needed. 30 tablet 0   isosorbide mononitrate (IMDUR) 60 MG 24 hr tablet Take 1 tablet (60 mg total) by mouth daily. 30 tablet 0   levothyroxine (SYNTHROID) 25 MCG tablet Take 1 tablet (25 mcg total) by mouth daily. 30 tablet 2   lovastatin (MEVACOR) 20 MG tablet Take 20 mg by mouth at bedtime.     Multiple Vitamin (MULTIVITAMIN) tablet Take 1 tablet by mouth every morning.      nicotine (NICODERM CQ - DOSED IN MG/24 HOURS) 14 mg/24hr patch Place 1 patch (14 mg total) onto the skin daily. 28 patch 0   Oxycodone HCl 10 MG TABS Take 10 mg by mouth 4 (four) times daily as needed (pain).     OXYGEN Inhale 1 L into the lungs at bedtime.     pantoprazole (PROTONIX) 40 MG tablet Take 1 tablet (40 mg total) by mouth daily. 30 tablet 1   polyethylene glycol (MIRALAX / GLYCOLAX) packet Take 17 g by mouth daily.     sertraline (ZOLOFT) 100 MG tablet Take 100 mg by mouth at bedtime.     STIOLTO RESPIMAT 2.5-2.5 MCG/ACT  AERS INHALE 2 PUFFS INTO THE LUNGS ONCE DAILY (Patient taking differently: Inhale 2 each into the lungs daily.) 4 g 5   triamcinolone cream (KENALOG) 0.1 % Apply topically 2 (two) times daily as needed (eczema).     glimepiride (AMARYL) 1 MG tablet Take 1 tablet (1 mg total) by mouth every morning. 30 tablet 3   potassium chloride SA (KLOR-CON M) 20 MEQ tablet Take 2 tablets (40 mEq total) by mouth daily for 3 days. 6 tablet 0   No facility-administered medications prior to visit.     Review of Systems:   Constitutional:   No  weight loss, night sweats,  Fevers, chills, +  fatigue, or  lassitude.  HEENT:   No headaches,  Difficulty swallowing,  Tooth/dental problems, or  Sore throat,                No sneezing, itching, ear ache, nasal congestion, post nasal drip,   CV:  No chest pain,  Orthopnea, PND, swelling in lower extremities, anasarca, dizziness, palpitations, syncope.   GI  No heartburn, indigestion, abdominal pain, nausea, vomiting, diarrhea, change in bowel habits, loss of appetite, bloody stools.   Resp:   No chest wall deformity  Skin: no rash or lesions.  GU: no dysuria, change in color of urine, no urgency or frequency.  No flank pain, no hematuria   MS:  No joint pain or swelling.  No decreased range of motion.  No back pain.    Physical Exam  BP 130/76 (BP Location: Right Arm, Patient Position: Sitting, Cuff Size: Normal)   Pulse 77   Temp 98.3 F (36.8 C) (Oral)   Ht 5' 5.5" (1.664 m)   Wt 187 lb 9.6 oz (85.1 kg)   SpO2 99%   BMI 30.74 kg/m   GEN: A/Ox3; pleasant , NAD, well nourished , wc    HEENT:  [place]/AT,  NOSE-clear, THROAT-clear, no lesions, no postnasal drip or exudate noted. Edentulous.   NECK:  Supple w/ fair ROM; no JVD; normal carotid impulses w/o bruits; no thyromegaly or nodules palpated; no lymphadenopathy.    RESP  Clear  P & A; w/o, wheezes/ rales/ or rhonchi. no accessory muscle use, no dullness to percussion  CARD:  RRR, no m/r/g, tr   peripheral edema, pulses intact, no cyanosis or clubbing.  GI:   Soft & nt; nml bowel sounds; no organomegaly or masses detected.   Musco: Warm bil, no deformities or joint swelling noted.   Neuro: alert, no focal deficits noted.    Skin: Warm, no lesions or rashes    Lab Results:  CBC     ProBNP No results found for: "PROBNP"  Imaging: ECHOCARDIOGRAM COMPLETE  Result Date: 06/23/2022    ECHOCARDIOGRAM REPORT   Patient Name:   KYLYNN ONUFER [facility] Date of Exam: 06/23/2022 Medical Rec #:  [id number]           Height:       69.0 in Accession #:    [id number]          Weight:       193.8 lb Date of Birth:  [birth year]          BSA:          2.038 m Patient Age:    71 years            BP:           152/79 mmHg Patient Gender: F                   HR:           70 bpm. Exam Location:  Inpatient Procedure: 2D Echo, 3D Echo, Cardiac Doppler, Color Doppler and Strain Analysis Indications:    R06.03 Respiratory distress  History:        Patient has prior history of Echocardiogram examinations, most                 recent 01/29/2020. COPD, Arrythmias:Atrial Fibrillation; Risk                 Factors:Current Smoker and Hypertension.  Sonographer:    Dondra Prader RVT RCS Referring  Phys: [id number] BRADLEY L ICARD IMPRESSIONS  1. Left ventricular ejection fraction, by estimation, is 60 to 65%. The left ventricle has normal function. The left ventricle has no regional wall motion abnormalities. There is moderate- severe concentric left ventricular hypertrophy. GLS -11.9%. Grade I diastolic dsyfunction. Due to severity of LVH and low GLS, consider evaluation for infiltrative cardiomyopathy.  2. Right ventricular systolic function is normal. The right ventricular size is normal.  3. The mitral valve is normal in structure. No evidence of mitral valve regurgitation. No evidence of mitral stenosis. Moderate mitral annular calcification.  4. The aortic valve is normal in structure. There is moderate calcification of the  aortic valve. Aortic valve regurgitation is not visualized. No aortic stenosis is present.  5. The inferior vena cava is normal in size with greater than 50% respiratory variability, suggesting right atrial pressure of 3 mmHg. FINDINGS  Left Ventricle: Left ventricular ejection fraction, by estimation, is 60 to 65%. The left ventricle has normal function. The left ventricle has no regional wall motion abnormalities. The left ventricular internal cavity size was normal in size. There is  severe concentric left ventricular hypertrophy. Left ventricular diastolic parameters are consistent with Grade I diastolic dysfunction (impaired relaxation). Right Ventricle: The right ventricular size is normal. No increase in right ventricular wall thickness. Right ventricular systolic function is normal. Left Atrium: Left atrial size was normal in size. Right Atrium: Right atrial size was normal in size. Pericardium: There is no evidence of pericardial effusion. Mitral Valve: The mitral valve is normal in structure. Moderate mitral annular calcification. No evidence of mitral valve regurgitation. No evidence of mitral valve stenosis. Tricuspid Valve: The tricuspid valve is normal in structure. Tricuspid valve regurgitation is not demonstrated. No evidence of tricuspid stenosis. Aortic Valve: The aortic valve is normal in structure. There is moderate calcification of the aortic valve. Aortic valve regurgitation is not visualized. No aortic stenosis is present. Aortic valve mean gradient measures 12.0 mmHg. Aortic valve peak gradient measures 19.9 mmHg. Aortic valve area, by VTI measures 2.02 cm. Pulmonic Valve: The pulmonic valve was normal in structure. Pulmonic valve regurgitation is mild. No evidence of pulmonic stenosis. Aorta: The aortic root is normal in size and structure. Venous: The inferior vena cava is normal in size with greater than 50% respiratory variability, suggesting right atrial pressure of 3 mmHg. IAS/Shunts:  No atrial level shunt detected by color flow Doppler. Additional Comments: A device lead is visualized.  LEFT VENTRICLE PLAX 2D LVIDd:         3.60 cm   Diastology LVIDs:         2.30 cm   LV e' medial:    6.20 cm/s LV PW:         1.70 cm   LV E/e' medial:  12.9 LV IVS:        1.50 cm   LV e' lateral:   6.20 cm/s LVOT diam:     1.80 cm   LV E/e' lateral: 12.9 LV SV:         99 LV SV Index:   49 LVOT Area:     2.54 cm                           3D Volume EF:                          3D EF:  53 %                          LV EDV:       111 ml                          LV ESV:       52 ml                          LV SV:        59 ml RIGHT VENTRICLE            IVC RV Basal diam:  3.70 cm    IVC diam: 1.50 cm RV S prime:     9.46 cm/s TAPSE (M-mode): 3.6 cm LEFT ATRIUM             Index        RIGHT ATRIUM           Index LA diam:        3.40 cm 1.67 cm/m   RA Area:     22.10 cm LA Vol (A2C):   56.6 ml 27.77 ml/m  RA Volume:   69.10 ml  33.90 ml/m LA Vol (A4C):   58.4 ml 28.65 ml/m LA Biplane Vol: 60.9 ml 29.88 ml/m  AORTIC VALVE                     PULMONIC VALVE AV Area (Vmax):    1.97 cm      PV Vmax:       0.95 m/s AV Area (Vmean):   1.88 cm      PV Peak grad:  3.6 mmHg AV Area (VTI):     2.02 cm AV Vmax:           223.00 cm/s AV Vmean:          165.000 cm/s AV VTI:            0.491 m AV Peak Grad:      19.9 mmHg AV Mean Grad:      12.0 mmHg LVOT Vmax:         173.00 cm/s LVOT Vmean:        122.000 cm/s LVOT VTI:          0.389 m LVOT/AV VTI ratio: 0.79  AORTA Ao Root diam: 2.90 cm Ao Asc diam:  3.70 cm MITRAL VALVE MV Area (PHT): 3.03 cm    SHUNTS MV Decel Time: 250 msec    Systemic VTI:  0.39 m MV E velocity: 79.70 cm/s  Systemic Diam: 1.80 cm MV A velocity: 95.90 cm/s MV E/A ratio:  0.83 Aditya Sabharwal Electronically signed by Dorthula Nettles Signature Date/Time: 06/23/2022/11:11:20 AM    Final    CT Angio Chest/Abd/Pel for Dissection W and/or Wo Contrast  Result Date: 06/22/2022 CLINICAL DATA:   Acute aortic syndrome suspected EXAM: CT ANGIOGRAPHY CHEST, ABDOMEN AND PELVIS TECHNIQUE: Non-contrast CT of the chest was initially obtained. Multidetector CT imaging through the chest, abdomen and pelvis was performed using the standard protocol during bolus administration of intravenous contrast. Multiplanar reconstructed images and MIPs were obtained and reviewed to evaluate the vascular anatomy. RADIATION DOSE REDUCTION: This exam was performed according to the departmental dose-optimization program which includes automated exposure control, adjustment of the mA and/or kV according to patient size and/or use of iterative reconstruction technique. CONTRAST:  75mL OMNIPAQUE IOHEXOL 350 MG/ML SOLN COMPARISON:  CT angiogram chest abdomen and pelvis 07/11/2020. CT chest abdomen and pelvis 03/06/2021. FINDINGS: CTA CHEST FINDINGS Cardiovascular: There is adequate opacification of the aorta. There is stable mild aneurysmal dilatation of the ascending aorta measuring 4.1 cm. Descending thoracic aortic graft is again noted which appears uncomplicated. There is no evidence for dissection or periaortic fluid/stranding. There is no central pulmonary embolism. The heart is enlarged. Right-sided pacemaker is present. There are atherosclerotic calcifications of the aorta. Mediastinum/Nodes: No enlarged mediastinal, hilar, or axillary lymph nodes. Thyroid gland, trachea, and esophagus demonstrate no significant findings. Lungs/Pleura: There is dependent atelectasis in the bilateral lower lobes. There are minimal patchy ground-glass opacities in both upper lobes. There is no pleural effusion or pneumothorax. Musculoskeletal: No acute fracture. Review of the MIP images confirms the above findings. CTA ABDOMEN AND PELVIS FINDINGS VASCULAR Aorta: Normal caliber aorta without aneurysm, dissection, vasculitis or significant stenosis. There are atherosclerotic calcifications of the aorta. Celiac: Patent without evidence of aneurysm,  dissection, vasculitis or significant stenosis. SMA: Patent without evidence of aneurysm, dissection, vasculitis or significant stenosis. Renals: There is moderate focal stenosis in the proximal left renal artery. The right renal artery is within normal limits. IMA: Patent without evidence of aneurysm, dissection, vasculitis or significant stenosis. Inflow: Left external iliac artery stent appears widely patent. There are atherosclerotic calcifications throughout. There is a moderate focal stenosis secondary to noncalcified plaque in the distal aspect of the right external iliac artery which appears unchanged from prior. Veins: No obvious venous abnormality within the limitations of this arterial phase study. Review of the MIP images confirms the above findings. NON-VASCULAR Hepatobiliary: No focal liver abnormality is seen. No gallstones, gallbladder wall thickening, or biliary dilatation. Pancreas: Unremarkable. No pancreatic ductal dilatation or surrounding inflammatory changes. Spleen: Normal in size without focal abnormality. Adrenals/Urinary Tract: Adrenal glands are unremarkable. Kidneys are normal, without renal calculi, focal lesion, or hydronephrosis. Bladder is unremarkable. Stomach/Bowel: Stomach is within normal limits. Appendix is not seen. No evidence of bowel wall thickening, distention, or inflammatory changes. Lymphatic: No enlarged lymph nodes are seen. Reproductive: Status post hysterectomy. No adnexal masses. Other: There is a small fat containing umbilical hernia. There is no ascites. Musculoskeletal: Degenerative changes affect the spine and hips. Review of the MIP images confirms the above findings. IMPRESSION: 1. No evidence for aortic dissection or rupture. 2. 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 Recommend semi-annual imaging followup by CTA or MRA and referral to cardiothoracic surgery if not already obtained. This recommendation follows 2010  ACCF/AHA/AATS/ACR/ASA/SCA/SCAI/SIR/STS/SVM Guidelines for the Diagnosis and Management of Patients With Thoracic Aortic Disease. Circulation. 2010; 121: W098-J19. Aortic aneurysm NOS (ICD10-I71.9) 3. Stable cardiomegaly. 4. Stable left external iliac artery stent. 5. Stable moderate focal stenosis in the distal right external iliac artery. 6. Stable moderate focal stenosis in the proximal left renal artery. 7. Minimal patchy ground-glass opacities in both upper lobes, likely infectious/inflammatory. Aortic Atherosclerosis (ICD10-I70.0). Electronically Signed   By: Darliss Cheney M.D.   On: 06/22/2022 15:49   DG Chest Portable 1 View  Result Date: 06/22/2022 CLINICAL DATA:  72 year old female with history of dyspnea. EXAM: PORTABLE CHEST 1 VIEW COMPARISON:  Chest x-ray 06/25/2020. FINDINGS: There is cephalization of the pulmonary vasculature and slight indistinctness of the interstitial markings suggestive of mild pulmonary edema. No definite pleural effusions. Mild cardiomegaly. The patient is rotated to the right on today's exam, resulting in distortion of the mediastinal contours and reduced diagnostic sensitivity and specificity  for mediastinal pathology. Aortic stent graft noted. Right-sided pacemaker device in place with lead tips projecting over the expected location of the right atrium and right ventricle. IMPRESSION: 1. The appearance the chest suggests mild congestive heart failure, as above. 2. Postprocedural changes and support apparatus, as above. Electronically Signed   By: Trudie Reed M.D.   On: 06/22/2022 11:08   CUP PACEART REMOTE DEVICE CHECK  Result Date: 06/12/2022 Monthly battery check.  Estimated <51mo Normal device function. 2 AHR's, brief atrial lead noise, known Follow up as scheduled monthly LA, CVRS         No data to display          No results found for: "NITRICOXIDE"      Assessment & Plan:   COPD with acute exacerbation (HCC) Recent COPD exacerbation from  decompensated heart failure.  Patient appears improved after hospitalization requiring BiPAP and aggressive diuresis.  Continue follow-up with cardiology.  Continue on Stiolto.  Needs PFTs on return.  Encouraged on smoking cessation  Plan Patient Instructions  Continue Stiolto 2 puffs once a day every day.  I am glad this is helped some. Continue to use Oxygen 2l/m with activity and At bedtime  , goal is to keep O2 sats >88-90%.  Work on quitting smoking .  Set up for PFT.  Follow up with Dr. Judeth Horn in 4 weeks as planned and As needed   Please contact office for sooner follow up if symptoms do not improve or worsen or seek emergency care      Chronic respiratory failure with hypoxia (HCC) Continue on oxygen to maintain O2 saturations greater than 88 to 90% Order for POC   Hypertension associated with diabetes [facility]) Recent hospitalization for hypertensive crisis.  Blood pressure appears under good control today.  Continue follow-up with cardiology  Congestive heart failure (CHF) (HCC) Recent hospitalization for decompensated congestive heart failure.  Patient blood pressure is under better control.  Continue on current maintenance regimen.  Does not appear fluid overloaded on exam.  Continue follow-up with cardiology     [name], NP 07/08/2022

## 2022-07-08 NOTE — Patient Instructions (Addendum)
Continue Stiolto 2 puffs once a day every day.   Continue to use Oxygen 2l/m with activity and At bedtime  , goal is to keep O2 sats >88-90%.  Work on quitting smoking .  Set up for PFT.  Follow up with Dr. Judeth Horn in 4 weeks as planned and As needed   Please contact office for sooner follow up if symptoms do not improve or worsen or seek emergency care

## 2022-07-08 NOTE — Assessment & Plan Note (Signed)
Recent hospitalization for decompensated congestive heart failure.  Patient blood pressure is under better control.  Continue on current maintenance regimen.  Does not appear fluid overloaded on exam.  Continue follow-up with cardiology

## 2022-07-10 ENCOUNTER — Telehealth (HOSPITAL_COMMUNITY): Payer: Self-pay

## 2022-07-10 NOTE — Telephone Encounter (Signed)
Called to confirm Heart & Vascular Transitions of Care appointment at 2pm on 07/11/22. Patient reminded to bring all medications and pill box organizer with them via voicemail. I was unable to confirm appointment due to patient not answering.

## 2022-07-11 ENCOUNTER — Encounter (HOSPITAL_COMMUNITY): Payer: Self-pay

## 2022-07-11 ENCOUNTER — Ambulatory Visit (HOSPITAL_COMMUNITY)
Admit: 2022-07-11 | Discharge: 2022-07-11 | Disposition: A | Discharge status: Home / Self Care | Payer: 59 | Attending: Physician Assistant | Admitting: Physician Assistant

## 2022-07-11 ENCOUNTER — Telehealth: Payer: Self-pay | Admitting: Adult Health

## 2022-07-11 VITALS — BP 140/80 | HR 74 | Wt 188.0 lb

## 2022-07-11 DIAGNOSIS — I480 Paroxysmal atrial fibrillation: Secondary | ICD-10-CM

## 2022-07-11 DIAGNOSIS — J9611 Chronic respiratory failure with hypoxia: Secondary | ICD-10-CM

## 2022-07-11 DIAGNOSIS — Z7982 Long term (current) use of aspirin: Secondary | ICD-10-CM | POA: Insufficient documentation

## 2022-07-11 DIAGNOSIS — Z7984 Long term (current) use of oral hypoglycemic drugs: Secondary | ICD-10-CM | POA: Insufficient documentation

## 2022-07-11 DIAGNOSIS — I130 Hypertensive heart and chronic kidney disease with heart failure and stage 1 through stage 4 chronic kidney disease, or unspecified chronic kidney disease: Secondary | ICD-10-CM | POA: Diagnosis not present

## 2022-07-11 DIAGNOSIS — N184 Chronic kidney disease, stage 4 (severe): Secondary | ICD-10-CM

## 2022-07-11 DIAGNOSIS — J9610 Chronic respiratory failure, unspecified whether with hypoxia or hypercapnia: Secondary | ICD-10-CM | POA: Diagnosis not present

## 2022-07-11 DIAGNOSIS — Z79899 Other long term (current) drug therapy: Secondary | ICD-10-CM | POA: Diagnosis not present

## 2022-07-11 DIAGNOSIS — E1122 Type 2 diabetes mellitus with diabetic chronic kidney disease: Secondary | ICD-10-CM | POA: Insufficient documentation

## 2022-07-11 DIAGNOSIS — J449 Chronic obstructive pulmonary disease, unspecified: Secondary | ICD-10-CM | POA: Insufficient documentation

## 2022-07-11 DIAGNOSIS — Z950 Presence of cardiac pacemaker: Secondary | ICD-10-CM | POA: Insufficient documentation

## 2022-07-11 DIAGNOSIS — Z9981 Dependence on supplemental oxygen: Secondary | ICD-10-CM | POA: Insufficient documentation

## 2022-07-11 DIAGNOSIS — I5032 Chronic diastolic (congestive) heart failure: Secondary | ICD-10-CM | POA: Insufficient documentation

## 2022-07-11 DIAGNOSIS — Z7902 Long term (current) use of antithrombotics/antiplatelets: Secondary | ICD-10-CM | POA: Insufficient documentation

## 2022-07-11 DIAGNOSIS — I4891 Unspecified atrial fibrillation: Secondary | ICD-10-CM | POA: Diagnosis not present

## 2022-07-11 DIAGNOSIS — E1151 Type 2 diabetes mellitus with diabetic peripheral angiopathy without gangrene: Secondary | ICD-10-CM | POA: Insufficient documentation

## 2022-07-11 DIAGNOSIS — I10 Essential (primary) hypertension: Secondary | ICD-10-CM | POA: Diagnosis not present

## 2022-07-11 DIAGNOSIS — N1832 Chronic kidney disease, stage 3b: Secondary | ICD-10-CM | POA: Diagnosis not present

## 2022-07-11 LAB — COMPREHENSIVE METABOLIC PANEL
ALT: 16 U/L (ref 0–44)
AST: 23 U/L (ref 15–41)
Albumin: 3.6 g/dL (ref 3.5–5.0)
Alkaline Phosphatase: 65 U/L (ref 38–126)
Anion gap: 10 (ref 5–15)
BUN: 38 mg/dL — ABNORMAL HIGH (ref 8–23)
CO2: 28 mmol/L (ref 22–32)
Calcium: 9.3 mg/dL (ref 8.9–10.3)
Chloride: 106 mmol/L (ref 98–111)
Creatinine, Ser: 2.29 mg/dL — ABNORMAL HIGH (ref 0.44–1.00)
GFR, Estimated: 22 mL/min — ABNORMAL LOW (ref >60)
Glucose, Bld: 125 mg/dL — ABNORMAL HIGH (ref 70–99)
Potassium: 4.2 mmol/L (ref 3.5–5.1)
Sodium: 144 mmol/L (ref 135–145)
Total Bilirubin: 0.5 mg/dL (ref 0.3–1.2)
Total Protein: 7.3 g/dL (ref 6.5–8.1)

## 2022-07-11 LAB — BRAIN NATRIURETIC PEPTIDE: B Natriuretic Peptide: 186.4 pg/mL — ABNORMAL HIGH (ref 0.0–100.0)

## 2022-07-11 MED ORDER — HYDROCHLOROTHIAZIDE 25 MG PO TABS: 25.0000 mg | ORAL_TABLET | Freq: Every day | ORAL | 1 refills | Status: AC

## 2022-07-11 MED ORDER — ISOSORBIDE MONONITRATE ER 60 MG PO TB24: 60.0000 mg | ORAL_TABLET | Freq: Every day | ORAL | 0 refills | Status: DC

## 2022-07-11 NOTE — Progress Notes (Addendum)
HEART & VASCULAR TRANSITION OF CARE CONSULT NOTE     Referring Physician: Dr. Renford Dills Primary Care: Dr. Roseanne Reno EP: Dr. Ladona Ridgel Cardiology: Previously seen by Dr. Duke Salvia in HTN clinic  HPI: Referred to clinic by Dr. Renford Dills with [facility] for heart failure consultation. 72 y.o. 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   She's had poorly controlled blood pressure for a number of years. Following her admission in August 2021 she was seen by Dr. Duke Salvia in the Hypertension Clinic. She was to return for follow-up visit in a month but was lost to follow-up.  Admitted earlier this month with respiratory distress in setting of hypertensive crisis and pulmonary edema. Briefly required BiPAP. Diuresed with IV lasix. Course also notable for AKI on CKD (baseline Scr 1.2-1.4), Scr peaked at 2.57. BP medications titrated. CTA with no evidence of aortic dissection, stable ascending aortic aneurysm measuring 4.1 cm, stable left EIA stent, moderate stenosis left renal artery with patent right renal artery.  Echo during admit: EF 60-65%. Moderate to severe LVH with GLS -11.9% (workup for infiltrative disease recommended d/t LVH and low GLS), RV okay  She is here today for follow-up. She has been feeling well. Reports she is taking all medications as prescribed. Denies dyspnea, orthopnea, PND and lower extremity edema. BP averaging 130s systolic. She lives alone and states she is able to complete ADLs without difficulty. She does get some fatigue walking long distances.  Has cut back smoking to 3 cigarettes a day. No ETOH.  Past Medical History:  Diagnosis Date   Anemia     Anxiety    Arthritis    Atrial fib/flutter, transient (HCC)    Cataract    COPD (chronic obstructive pulmonary disease) (HCC)    Depression    Diabetes mellitus    Diverticulosis    DJD (degenerative joint disease)    GERD (gastroesophageal reflux disease)    Gout    History of pneumonia    Hypertension    Hypothyroidism    Internal hemorrhoids     Current Outpatient Medications  Medication Sig Dispense Refill   aspirin 81 MG tablet Take 81 mg by mouth every morning.      carvedilol (COREG) 25 MG tablet Take 1 tablet (25 mg total) by mouth 2 (two) times daily with a meal. 180 tablet 3   cholecalciferol (VITAMIN D) 1000 UNITS tablet Take 1,000 Units by mouth every morning.      cloNIDine (CATAPRES) 0.3 MG tablet Take 1 tablet (0.3 mg total) by mouth 3 (three) times daily. 90 tablet 0   clopidogrel (PLAVIX) 75 MG tablet Take 75 mg by mouth daily.     clotrimazole-betamethasone (LOTRISONE) cream Apply topically.     famotidine (PEPCID) 20 MG tablet Take 20 mg by mouth daily.     gabapentin (NEURONTIN) 100 MG capsule Take 1 capsule (100 mg total) by mouth 3 (three) times daily. 90 capsule 2   glimepiride (AMARYL) 1 MG tablet Take 1 tablet (1 mg total) by mouth every morning. 30 tablet 3   hydrALAZINE (APRESOLINE) 100 MG tablet Take 1 tablet (100 mg total) by mouth 3 (three) times daily. 90 tablet 0   hydrOXYzine (ATARAX) 50 MG tablet  Take 1 tablet (50 mg total) by mouth every 8 (eight) hours as needed. 30 tablet 0   levothyroxine (SYNTHROID) 25 MCG tablet Take 1 tablet (25 mcg total) by mouth daily. 30 tablet 2   lovastatin (MEVACOR) 20 MG tablet Take 20 mg by mouth at bedtime.     Multiple Vitamin (MULTIVITAMIN) tablet Take 1 tablet by mouth every morning.      nicotine (NICODERM CQ - DOSED IN MG/24 HOURS) 14 mg/24hr patch Place 1 patch (14 mg total) onto the skin daily. 28 patch 0   Oxycodone HCl 10 MG TABS Take 10 mg by mouth 4 (four) times daily as needed (pain).     OXYGEN Inhale  1 L into the lungs at bedtime.     pantoprazole (PROTONIX) 40 MG tablet Take 1 tablet (40 mg total) by mouth daily. 30 tablet 1   polyethylene glycol (MIRALAX / GLYCOLAX) packet Take 17 g by mouth daily.     sertraline (ZOLOFT) 100 MG tablet Take 100 mg by mouth at bedtime.     STIOLTO RESPIMAT 2.5-2.5 MCG/ACT AERS INHALE 2 PUFFS INTO THE LUNGS ONCE DAILY 4 g 5   triamcinolone cream (KENALOG) 0.1 % Apply topically 2 (two) times daily as needed (eczema).     hydrochlorothiazide (HYDRODIURIL) 25 MG tablet Take 1 tablet (25 mg total) by mouth daily. 30 tablet 1   isosorbide mononitrate (IMDUR) 60 MG 24 hr tablet Take 1 tablet (60 mg total) by mouth daily. 30 tablet 0   No current facility-administered medications for this encounter.    Allergies  Allergen Reactions   Amlodipine Swelling    Started very close to episode of angioedema to ACE, unknown if this added effects or just residual   Lisinopril     angioedema   Losartan Swelling    Lip swelling    Codeine Phosphate Other (See Comments)    REACTION: UNKNOWN   Morphine And Codeine Other (See Comments)    Cannot take:Heart problems   Naproxen Nausea And Vomiting   Nsaids Nausea Only   Penicillins Other (See Comments)    UNKNOWN REACTION   Propoxyphene N-Acetaminophen Other (See Comments)    Makes me gag   Sulfamethoxazole-Trimethoprim Other (See Comments)    Gi upset   Flexeril [Cyclobenzaprine] Anxiety and Other (See Comments)    Pt states medication gives her nightmares and insomnia.      Social History   Socioeconomic History   Marital status: Widowed    Spouse name: Not on file   Number of children: 2   Years of education: Not on file   Highest education level: Not on file  Occupational History   Occupation: Disabled    Employer: UNEMPLOYED  Tobacco Use   Smoking status: Every Day    Packs/day: 2.00    Years: 54.00    Additional pack years: 0.00    Total pack years: 108.00    Types: Cigarettes    Passive  exposure: Never   Smokeless tobacco: Never   Tobacco comments:    3 cigarettes daily.  Trying to quit.  07/08/2022 hfb  Vaping Use   Vaping Use: Never used  Substance and Sexual Activity   Alcohol use: No   Drug use: No   Sexual activity: Yes    Birth control/protection: Surgical  Other Topics Concern   Not on file  Social History Narrative   Right handed    Has a son   Hostess at hotel    Social Determinants  of Health   Financial Resource Strain: Low Risk  (06/24/2022)   Overall Financial Resource Strain (CARDIA)    Difficulty of Paying Living Expenses: Not very hard  Food Insecurity: No Food Insecurity (06/24/2022)   Hunger Vital Sign    Worried About Running Out of Food in the Last Year: Never true    Ran Out of Food in the Last Year: Never true  Transportation Needs: No Transportation Needs (06/24/2022)   PRAPARE - Administrator, Civil Service (Medical): No    Lack of Transportation (Non-Medical): No  Physical Activity: Unknown (11/28/2019)   Exercise Vital Sign    Days of Exercise per Week: Not on file    Minutes of Exercise per Session: 20 min  Stress: Stress Concern Present (11/28/2019)   [employer] of Occupational Health - Occupational Stress Questionnaire    Feeling of Stress : Rather much  Social Connections: Not on file  Intimate Partner Violence: Not At Risk (06/24/2022)   Humiliation, Afraid, Rape, and Kick questionnaire    Fear of Current or Ex-Partner: No    Emotionally Abused: No    Physically Abused: No    Sexually Abused: No      Family History  Problem Relation Age of Onset   Prostate cancer Father    Hypertension Mother    Heart attack Mother    Heart disease Mother    Kidney disease Mother 65   Stomach cancer Son    Diabetes Maternal Aunt    CAD Maternal Aunt    Stroke Maternal Aunt    Heart disease Maternal Uncle    CAD Maternal Uncle    Stroke Maternal Uncle    Colon cancer Neg Hx    Esophageal cancer Neg Hx    Rectal  cancer Neg Hx     Vitals:   07/11/22 1417  BP: (!) 140/80  Pulse: 74  SpO2: 95%  Weight: 85.3 kg (188 lb)    PHYSICAL EXAM: General:  Well appearing. No respiratory difficulty HEENT: normal Neck: supple. no JVD. Carotids 2+ bilat; no bruits. No lymphadenopathy or thryomegaly appreciated. Cor: PMI nondisplaced. Regular rate & rhythm. No rubs, gallops or murmurs. Lungs: clear Abdomen: soft, nontender, nondistended. No hepatosplenomegaly. No bruits or masses. Good bowel sounds. Extremities: no cyanosis, clubbing, rash, edema Neuro: alert & oriented x 3, cranial nerves grossly intact. moves all 4 extremities w/o difficulty. Affect pleasant.  ECG: Apaced, 73 bpm   ASSESSMENT & PLAN: HFpEF -Echo 06/24: EF 60-65%. Moderate to severe LVH with GLS -11.9% (workup for infiltrative disease recommended d/t LVH and low GLS), RV okay -Suspect LVH may be d/t hypertension but need to rule out infiltrative disease including cardiac amyloidosis. Has PPM, so no cMRI. Ordered myeloma panel, urine immunofixation and serum free light chains. Will need to establish with AHF physician to obtain PYP scan. NYHA II GDMT  Diuretic-No loop diuretic -Add spiro if renal function stable on today's labs -Hx angioedema with ACE and ARB -On coreg 25 BID for HTN, not needed for HFpEF -Continue imdur 60/hydralazine 100 TID -Consider Farxiga next  HTN -Longstanding uncontrolled HTN. BP above goal today -CTA 06/24 with moderate left renal artery stenosis and patent right renal artery -? Untreated OSA. Recommend sleep study. Will reach out to her Pulmonologist. -Adding spiro as above if renal function stable -Refer to HTN clinic  Chronic respiratory failure COPD -On chronic O2 at night and with activity -Has not completed PFTs. Evidence of emphysema noted on prior imaging -Needs  to quit smoking. Has been cutting back gradually.   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  Followed by EP. If has recurrence will need anticoagulation.  CKD IIIb/IV -Prior baseline Cr 1.3-1.5 but she's had several AKIs -Etiology HTN? -Most recently Scr has been 1.7-2s. -May need Nephrology referral -Labs today   Referred to HFSW (PCP, Medications, Transportation, ETOH Abuse, Drug Abuse, Insurance, Financial ): No Refer to Pharmacy: No Refer to Home Health: No Refer to Advanced Heart Failure Clinic: Yes Refer to General Cardiology: Yes  Follow up  Establish with HF in 3 weeks to complete amyloid workup, if negative can likely follow-up with Cardiology. Referred to HTN clinic.

## 2022-07-11 NOTE — Telephone Encounter (Signed)
Rec'd message from Terri C with [employer] RE: Continuous with stationary and portable oxygen POC @ setting of 2 pulsed dose Received: Today Coltrane, Bryson Dames, Jacqlyn Krauss, [place]; Pernell Dupre, Melissa L This patient already receives O2, her Rx is 2lpm she does not qualify for a POC, she does not use her portable tanks regularly. The last time she ordered any was June of 2022.  She will need to use 8 tanks per month for 3 consecutive months to qualify. I will reach out to the patient to inform.

## 2022-07-11 NOTE — Addendum Note (Signed)
Encounter addended by: Andrey Farmer, PA-C on: 07/11/2022 4:38 PM  Actions taken: Clinical Note Signed, Visit diagnoses modified

## 2022-07-11 NOTE — Patient Instructions (Addendum)
Medication Changes:  No Changes In Medications at this time.   Lab Work:  Labs done today, your results will be available in MyChart, we will contact you for abnormal readings.  PLEASE GO TO A LOCAL [facility] FOR YOUR URINE COLLECTION- THIS MUST BE KEPT COLD   REFERRAL TO [facility]- SOMEONE WILL REACH OUT TO YOU TO GET YOU SCHEDULED FOR THIS  Follow-Up in: 4 WEEKS WITH MD AS SCHEDULED   At the [facility] Failure Clinic, you and your health needs are our priority. We have a designated team specialized in the treatment of Heart Failure. This Care Team includes your primary Heart Failure Specialized Cardiologist (physician), Advanced Practice Providers (APPs- Physician Assistants and Nurse Practitioners), and Pharmacist who all work together to provide you with the care you need, when you need it.   You may see any of the following providers on your designated Care Team at your next follow up:  Dr. Arvilla Meres Dr. Marca Ancona Dr. Marcos Eke, NP Robbie Lis, [place] [facility] [place], [place] Brynda Peon, NP Karle Plumber, PharmD   Please be sure to bring in all your medications bottles to every appointment.   Need to Contact us:  If you have any questions or concerns before your next appointment please send [place] a message through [place] or call our office at [phone].    TO LEAVE A MESSAGE FOR THE NURSE SELECT OPTION 2, PLEASE LEAVE A MESSAGE INCLUDING: YOUR NAME DATE OF BIRTH CALL BACK NUMBER REASON FOR CALL**this is important as we prioritize the call backs  YOU WILL RECEIVE A CALL BACK THE SAME DAY AS LONG AS YOU CALL BEFORE 4:00 PM

## 2022-07-11 NOTE — Addendum Note (Signed)
Encounter addended by: Andrey Farmer, PA-C on: 07/11/2022 4:47 PM  Actions taken: Clinical Note Signed

## 2022-07-14 ENCOUNTER — Ambulatory Visit (INDEPENDENT_AMBULATORY_CARE_PROVIDER_SITE_OTHER): Payer: 59

## 2022-07-14 ENCOUNTER — Ambulatory Visit (INDEPENDENT_AMBULATORY_CARE_PROVIDER_SITE_OTHER): Payer: 59 | Admitting: Surgery

## 2022-07-14 ENCOUNTER — Other Ambulatory Visit (HOSPITAL_COMMUNITY): Payer: Self-pay | Admitting: Physician Assistant

## 2022-07-14 ENCOUNTER — Encounter: Payer: Self-pay | Admitting: Surgery

## 2022-07-14 VITALS — BP 153/87 | HR 72 | Temp 98.1°F | Resp 20 | Ht 65.5 in | Wt 187.0 lb

## 2022-07-14 DIAGNOSIS — I480 Paroxysmal atrial fibrillation: Secondary | ICD-10-CM

## 2022-07-14 DIAGNOSIS — I712 Thoracic aortic aneurysm, without rupture, unspecified: Secondary | ICD-10-CM | POA: Diagnosis not present

## 2022-07-14 NOTE — Progress Notes (Signed)
[facility]  Patient name: [name] MRN: [id number] DOB: 04/13/1950 Sex: female   REASON FOR VISIT:    Follow up  HISOTRY OF PRESENT ILLNESS:    [name] is a 72 y.o. female who presented to the hospital in August 2021 with chest and back pain.  She was very hypertensive.  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  She was initially managed nonoperatively, however we could not get her blood pressure stabilized.  Ultimately on 09/09/2019 she underwent endovascular stent graft placement without left subclavian artery coverage.  This required a conduit off of the left external iliac artery.  She did have plaque at the level of the conduit which was evaluated with angiography from the groin after the procedure, and a stenosis was visualized so this was stented.  On postoperative day 2 she experienced lower extremity paralysis.  A spinal drain was placed and her weakness ultimately resolved.  She did develop a acute episode of renal insufficiency which resolved on its own.   She has had persistent pain in her left groin since the procedure.  A CT scan showed a iliac pseudoaneurysm.  It was initially felt that this was her conduit, however since it had increased in size, I elected to repair it.  On 07/24/2020 she had stenting of her left external iliac artery with a Viabahn..  She was recently admitted for pulmonary issues that had subsequently resolved and was felt to be due to fluid overload.   The patient has a diagnosis of A. fib but is only taking aspirin.  She is a current smoker and has COPD.  She is medically managed for hypertension.  She is a diabetic.  She is medically managed for hypertension.  She takes a statin for hypercholesterolemia   At her last visit, she could not receive contrast because of her creatinine and so I was not able to adequately visualize her aorta.  Her  left iliac pseudoaneurysm appeared to be completely resolved.  She was still complaining of left lower quadrant pain, and so I ordered an ultrasound and ABIs.  She is back today    PAST MEDICAL HISTORY:   Past Medical History:  Diagnosis Date   Anemia    Anxiety    Arthritis    Atrial fib/flutter, transient (HCC)    Cataract    COPD (chronic obstructive pulmonary disease) (HCC)    Depression    Diabetes mellitus    Diverticulosis    DJD (degenerative joint disease)    GERD (gastroesophageal reflux disease)    Gout    History of pneumonia    Hypertension    Hypothyroidism    Internal hemorrhoids      FAMILY HISTORY:   Family History  Problem Relation Age of Onset   Prostate cancer Father    Hypertension Mother    Heart attack Mother    Heart disease Mother    Kidney disease Mother 53   Stomach cancer Son    Diabetes Maternal Aunt    CAD Maternal Aunt    Stroke Maternal Aunt    Heart disease Maternal Uncle    CAD Maternal Uncle    Stroke Maternal Uncle    Colon cancer Neg Hx    Esophageal cancer Neg Hx    Rectal cancer Neg Hx     SOCIAL HISTORY:   Social History   Tobacco Use   Smoking status: Every Day  Packs/day: 2.00    Years: 54.00    Additional pack years: 0.00    Total pack years: 108.00    Types: Cigarettes    Passive exposure: Never   Smokeless tobacco: Never   Tobacco comments:    3 cigarettes daily.  Trying to quit.  07/08/2022 hfb  Substance Use Topics   Alcohol use: No     ALLERGIES:   Allergies  Allergen Reactions   Amlodipine Swelling    Started very close to episode of angioedema to ACE, unknown if this added effects or just residual   Lisinopril     angioedema   Losartan Swelling    Lip swelling    Codeine Phosphate Other (See Comments)    REACTION: UNKNOWN   Morphine And Codeine Other (See Comments)    Cannot take:Heart problems   Naproxen Nausea And Vomiting   Nsaids Nausea Only   Penicillins Other (See Comments)     UNKNOWN REACTION   Propoxyphene N-Acetaminophen Other (See Comments)    Makes me gag   Sulfamethoxazole-Trimethoprim Other (See Comments)    Gi upset   Flexeril [Cyclobenzaprine] Anxiety and Other (See Comments)    Pt states medication gives her nightmares and insomnia.     CURRENT MEDICATIONS:   Current Outpatient Medications  Medication Sig Dispense Refill   aspirin 81 MG tablet Take 81 mg by mouth every morning.      carvedilol (COREG) 25 MG tablet Take 1 tablet (25 mg total) by mouth 2 (two) times daily with a meal. 180 tablet 3   cholecalciferol (VITAMIN D) 1000 UNITS tablet Take 1,000 Units by mouth every morning.      cloNIDine (CATAPRES) 0.3 MG tablet Take 1 tablet (0.3 mg total) by mouth 3 (three) times daily. 90 tablet 0   clopidogrel (PLAVIX) 75 MG tablet Take 75 mg by mouth daily.     clotrimazole-betamethasone (LOTRISONE) cream Apply topically.     famotidine (PEPCID) 20 MG tablet Take 20 mg by mouth daily.     gabapentin (NEURONTIN) 100 MG capsule Take 1 capsule (100 mg total) by mouth 3 (three) times daily. 90 capsule 2   glimepiride (AMARYL) 1 MG tablet Take 1 tablet (1 mg total) by mouth every morning. 30 tablet 3   hydrALAZINE (APRESOLINE) 100 MG tablet Take 1 tablet (100 mg total) by mouth 3 (three) times daily. 90 tablet 0   hydrochlorothiazide (HYDRODIURIL) 25 MG tablet Take 1 tablet (25 mg total) by mouth daily. 30 tablet 1   hydrOXYzine (ATARAX) 50 MG tablet Take 1 tablet (50 mg total) by mouth every 8 (eight) hours as needed. 30 tablet 0   isosorbide mononitrate (IMDUR) 60 MG 24 hr tablet Take 1 tablet (60 mg total) by mouth daily. 30 tablet 0   levothyroxine (SYNTHROID) 25 MCG tablet Take 1 tablet (25 mcg total) by mouth daily. 30 tablet 2   lovastatin (MEVACOR) 20 MG tablet Take 20 mg by mouth at bedtime.     Multiple Vitamin (MULTIVITAMIN) tablet Take 1 tablet by mouth every morning.      nicotine (NICODERM CQ - DOSED IN MG/24 HOURS) 14 mg/24hr patch Place 1  patch (14 mg total) onto the skin daily. 28 patch 0   Oxycodone HCl 10 MG TABS Take 10 mg by mouth 4 (four) times daily as needed (pain).     OXYGEN Inhale 1 L into the lungs at bedtime.     pantoprazole (PROTONIX) 40 MG tablet Take 1 tablet (40 mg total) by  mouth daily. 30 tablet 1   polyethylene glycol (MIRALAX / GLYCOLAX) packet Take 17 g by mouth daily.     sertraline (ZOLOFT) 100 MG tablet Take 100 mg by mouth at bedtime.     STIOLTO RESPIMAT 2.5-2.5 MCG/ACT AERS INHALE 2 PUFFS INTO THE LUNGS ONCE DAILY 4 g 5   triamcinolone cream (KENALOG) 0.1 % Apply topically 2 (two) times daily as needed (eczema).     No current facility-administered medications for this visit.    REVIEW OF SYSTEMS:   [X]  denotes positive finding, [ ]  denotes negative finding Cardiac  Comments:  Chest pain or chest pressure:    Shortness of breath upon exertion:    Short of breath when lying flat:    Irregular heart rhythm:        Vascular    Pain in calf, thigh, or hip brought on by ambulation:    Pain in feet at night that wakes you up from your sleep:     Blood clot in your veins:    Leg swelling:         Pulmonary    Oxygen at home:    Productive cough:     Wheezing:         Neurologic    Sudden weakness in arms or legs:     Sudden numbness in arms or legs:     Sudden onset of difficulty speaking or slurred speech:    Temporary loss of vision in one eye:     Problems with dizziness:         Gastrointestinal    Blood in stool:     Vomited blood:         Genitourinary    Burning when urinating:     Blood in urine:        Psychiatric    Major depression:         Hematologic    Bleeding problems:    Problems with blood clotting too easily:        Skin    Rashes or ulcers:        Constitutional    Fever or chills:      PHYSICAL EXAM:   Vitals:   07/14/22 1400  BP: (!) 153/87  Pulse: 72  Resp: 20  Temp: 98.1 F (36.7 C)  SpO2: 90%  Weight: 187 lb (84.8 kg)  Height: 5'  5.5" (1.664 m)    GENERAL: The patient is a well-nourished female, in no acute distress. The vital signs are documented above. CARDIAC: There is a regular rate and rhythm.  VASCULAR: Palpable pedal pulses bilaterally PULMONARY: Non-labored respirations.  Breath sounds are normal ABDOMEN: Soft and non-tender MUSCULOSKELETAL: There are no major deformities or cyanosis. NEUROLOGIC: No focal weakness or paresthesias are detected. SKIN: There are no ulcers or rashes noted. PSYCHIATRIC: The patient has a normal affect.  STUDIES:   I reviewed her CT scan with the following findings: 1. No evidence for aortic dissection or rupture. 2. 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 Recommend semi-annual imaging followup by CTA or MRA and referral to cardiothoracic surgery if not already obtained. This recommendation follows 2010 ACCF/AHA/AATS/ACR/ASA/SCA/SCAI/SIR/STS/SVM Guidelines for the Diagnosis and Management of Patients With Thoracic Aortic Disease. Circulation. 2010; 121: N562-Z30. Aortic aneurysm NOS (ICD10-I71.9) 3. Stable cardiomegaly. 4. Stable left external iliac artery stent. 5. Stable moderate focal stenosis in the distal right external iliac artery. 6. Stable moderate focal stenosis in the proximal left renal artery. 7. Minimal  patchy ground-glass opacities in both upper lobes, likely infectious/inflammatory.  MEDICAL ISSUES:   Aortic dissection/IMH: CT scan shows successful exclusion of aortic pathology.  The stent graft is in good position.  There is no evidence of endoleak.  Her iliac stent on the left is widely patent.  There is no residual pseudoaneurysm.  I will plan on repeating her CT scan in 2 years    Charlena Cross, MD, FACS [facility] [phone] Pager [phone]

## 2022-07-15 LAB — CUP PACEART REMOTE DEVICE CHECK
Battery Impedance: 6155 Ohm
Battery Remaining Longevity: 1 mo — CL
Battery Voltage: 2.64 V
Brady Statistic AP VP Percent: 1 %
Brady Statistic AP VS Percent: 90 %
Brady Statistic AS VP Percent: 1 %
Brady Statistic AS VS Percent: 8 %
Date Time Interrogation Session: 20240624070935
Implantable Lead Connection Status: 753985
Implantable Lead Connection Status: 753985
Implantable Lead Implant Date: 19970929
Implantable Lead Implant Date: 19970929
Implantable Lead Location: 753859
Implantable Lead Location: 753860
Implantable Lead Model: 4068
Implantable Lead Model: 5092
Implantable Pulse Generator Implant Date: 20090505
Lead Channel Impedance Value: 607 Ohm
Lead Channel Impedance Value: 672 Ohm
Lead Channel Setting Pacing Amplitude: 2.5 V
Lead Channel Setting Pacing Amplitude: 2.5 V
Lead Channel Setting Pacing Pulse Width: 0.4 ms
Lead Channel Setting Sensing Sensitivity: 5.6 mV
Zone Setting Status: 755011
Zone Setting Status: 755011

## 2022-07-16 LAB — MULTIPLE MYELOMA PANEL, SERUM
Albumin SerPl Elph-Mcnc: 3.9 g/dL (ref 2.9–4.4)
Albumin/Glob SerPl: 1.3 (ref 0.7–1.7)
Alpha 1: 0.2 g/dL (ref 0.0–0.4)
Alpha2 Glob SerPl Elph-Mcnc: 0.6 g/dL (ref 0.4–1.0)
B-Globulin SerPl Elph-Mcnc: 1.1 g/dL (ref 0.7–1.3)
Gamma Glob SerPl Elph-Mcnc: 1.4 g/dL (ref 0.4–1.8)
Globulin, Total: 3.2 g/dL (ref 2.2–3.9)
IgA: 460 mg/dL — ABNORMAL HIGH (ref 64–422)
IgG (Immunoglobin G), Serum: 1478 mg/dL (ref 586–1602)
IgM (Immunoglobulin M), Srm: 36 mg/dL (ref 26–217)
Total Protein ELP: 7.1 g/dL (ref 6.0–8.5)

## 2022-07-18 LAB — IMMUNOFIXATION, URINE

## 2022-07-30 ENCOUNTER — Telehealth (HOSPITAL_COMMUNITY): Payer: Self-pay

## 2022-07-30 NOTE — Telephone Encounter (Signed)
-----   Message from Andrey Farmer, [place] sent at 07/11/2022  4:35 PM EDT ----- Her renal function is worse. Will avoid additional blood pressure lowering since she has been in 130s. Refer to Nephrology for stage IV CKD

## 2022-07-30 NOTE — Telephone Encounter (Signed)
No answer, Left message to return call. Letter Mailed

## 2022-07-31 NOTE — Telephone Encounter (Signed)
Pt aware and reports PCP has already referred her to nephrology, will not duplicate referral at this time. Advised pt if she had not received appt or correspondence from [facility] within 2-3 weeks should follow up with PCP to check the status of referral

## 2022-08-01 NOTE — Addendum Note (Signed)
Addended by: Geralyn Flash D on: 08/01/2022 02:09 PM   Modules accepted: Level of Service

## 2022-08-01 NOTE — Progress Notes (Signed)
Remote pacemaker transmission.   

## 2022-08-04 ENCOUNTER — Encounter (HOSPITAL_COMMUNITY): Payer: Self-pay | Admitting: Cardiology

## 2022-08-04 ENCOUNTER — Ambulatory Visit (HOSPITAL_COMMUNITY)
Admission: RE | Admit: 2022-08-04 | Discharge: 2022-08-04 | Disposition: A | Discharge status: Home / Self Care | Payer: 59 | Source: Ambulatory Visit | Attending: Cardiology | Admitting: Cardiology

## 2022-08-04 ENCOUNTER — Other Ambulatory Visit (HOSPITAL_COMMUNITY): Payer: Self-pay

## 2022-08-04 VITALS — BP 170/80 | HR 71 | Wt 187.4 lb

## 2022-08-04 DIAGNOSIS — J9610 Chronic respiratory failure, unspecified whether with hypoxia or hypercapnia: Secondary | ICD-10-CM | POA: Diagnosis not present

## 2022-08-04 DIAGNOSIS — Z950 Presence of cardiac pacemaker: Secondary | ICD-10-CM | POA: Diagnosis not present

## 2022-08-04 DIAGNOSIS — Z7984 Long term (current) use of oral hypoglycemic drugs: Secondary | ICD-10-CM | POA: Diagnosis not present

## 2022-08-04 DIAGNOSIS — N184 Chronic kidney disease, stage 4 (severe): Secondary | ICD-10-CM | POA: Diagnosis not present

## 2022-08-04 DIAGNOSIS — Z79899 Other long term (current) drug therapy: Secondary | ICD-10-CM | POA: Diagnosis not present

## 2022-08-04 DIAGNOSIS — I130 Hypertensive heart and chronic kidney disease with heart failure and stage 1 through stage 4 chronic kidney disease, or unspecified chronic kidney disease: Secondary | ICD-10-CM | POA: Diagnosis not present

## 2022-08-04 DIAGNOSIS — Z823 Family history of stroke: Secondary | ICD-10-CM | POA: Diagnosis not present

## 2022-08-04 DIAGNOSIS — Z833 Family history of diabetes mellitus: Secondary | ICD-10-CM | POA: Diagnosis not present

## 2022-08-04 DIAGNOSIS — I480 Paroxysmal atrial fibrillation: Secondary | ICD-10-CM

## 2022-08-04 DIAGNOSIS — Z8249 Family history of ischemic heart disease and other diseases of the circulatory system: Secondary | ICD-10-CM | POA: Diagnosis not present

## 2022-08-04 DIAGNOSIS — N1832 Chronic kidney disease, stage 3b: Secondary | ICD-10-CM | POA: Insufficient documentation

## 2022-08-04 DIAGNOSIS — Z9981 Dependence on supplemental oxygen: Secondary | ICD-10-CM | POA: Diagnosis not present

## 2022-08-04 DIAGNOSIS — Z95820 Peripheral vascular angioplasty status with implants and grafts: Secondary | ICD-10-CM | POA: Insufficient documentation

## 2022-08-04 DIAGNOSIS — I5032 Chronic diastolic (congestive) heart failure: Secondary | ICD-10-CM | POA: Insufficient documentation

## 2022-08-04 DIAGNOSIS — Z7982 Long term (current) use of aspirin: Secondary | ICD-10-CM | POA: Insufficient documentation

## 2022-08-04 DIAGNOSIS — I10 Essential (primary) hypertension: Secondary | ICD-10-CM | POA: Diagnosis not present

## 2022-08-04 DIAGNOSIS — E1122 Type 2 diabetes mellitus with diabetic chronic kidney disease: Secondary | ICD-10-CM | POA: Insufficient documentation

## 2022-08-04 DIAGNOSIS — Z86718 Personal history of other venous thrombosis and embolism: Secondary | ICD-10-CM | POA: Insufficient documentation

## 2022-08-04 DIAGNOSIS — Z7902 Long term (current) use of antithrombotics/antiplatelets: Secondary | ICD-10-CM | POA: Insufficient documentation

## 2022-08-04 DIAGNOSIS — I110 Hypertensive heart disease with heart failure: Secondary | ICD-10-CM | POA: Diagnosis present

## 2022-08-04 DIAGNOSIS — I701 Atherosclerosis of renal artery: Secondary | ICD-10-CM | POA: Insufficient documentation

## 2022-08-04 DIAGNOSIS — F17210 Nicotine dependence, cigarettes, uncomplicated: Secondary | ICD-10-CM | POA: Insufficient documentation

## 2022-08-04 DIAGNOSIS — E1151 Type 2 diabetes mellitus with diabetic peripheral angiopathy without gangrene: Secondary | ICD-10-CM | POA: Diagnosis not present

## 2022-08-04 LAB — COMPREHENSIVE METABOLIC PANEL
ALT: 17 U/L (ref 0–44)
AST: 22 U/L (ref 15–41)
Albumin: 3.5 g/dL (ref 3.5–5.0)
Alkaline Phosphatase: 67 U/L (ref 38–126)
Anion gap: 10 (ref 5–15)
BUN: 45 mg/dL — ABNORMAL HIGH (ref 8–23)
CO2: 29 mmol/L (ref 22–32)
Calcium: 9.3 mg/dL (ref 8.9–10.3)
Chloride: 102 mmol/L (ref 98–111)
Creatinine, Ser: 2.18 mg/dL — ABNORMAL HIGH (ref 0.44–1.00)
GFR, Estimated: 23 mL/min — ABNORMAL LOW (ref >60)
Glucose, Bld: 214 mg/dL — ABNORMAL HIGH (ref 70–99)
Potassium: 3.4 mmol/L — ABNORMAL LOW (ref 3.5–5.1)
Sodium: 141 mmol/L (ref 135–145)
Total Bilirubin: 0.4 mg/dL (ref 0.3–1.2)
Total Protein: 7.3 g/dL (ref 6.5–8.1)

## 2022-08-04 LAB — BRAIN NATRIURETIC PEPTIDE: B Natriuretic Peptide: 163.6 pg/mL — ABNORMAL HIGH (ref 0.0–100.0)

## 2022-08-04 MED ORDER — DOXAZOSIN MESYLATE 1 MG PO TABS: 1.0000 mg | ORAL_TABLET | Freq: Every day | ORAL | 3 refills | Status: DC

## 2022-08-04 MED ORDER — EMPAGLIFLOZIN 10 MG PO TABS: 10.0000 mg | ORAL_TABLET | Freq: Every day | ORAL | 3 refills | Status: DC

## 2022-08-04 NOTE — Progress Notes (Signed)
ADVANCED HEART FAILURE CLINIC NOTE  Referring Physician: [name], MD  Primary Care: [name], MD Primary Cardiologist: Chilton Si  HPI: [name] is a 72 y.o. 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  She was admitted to [facility] in 6/24 with hypertensive crisis and pulmonary edema requiring BiPAP and IV Lasix.  EF during admission 60 to 65% with moderate to severe LVH.  Since that time she has been seen in [facility] clinic.  Interval history Continues to struggle with BP management at home. Reports that her BP ranges from 130-180 at home. She is fairly non-ambulatory (arrived to clinic in wheelchair) due to sciatica / low back pain. Reports compliance with medications. Very concerned about progressing to dialysis.   Past Medical History:  Diagnosis Date   Anemia    Anxiety    Arthritis    Atrial fib/flutter, transient (HCC)    Cataract    COPD (chronic obstructive pulmonary disease) (HCC)    Depression    Diabetes mellitus    Diverticulosis    DJD (degenerative joint disease)    GERD (gastroesophageal reflux disease)    Gout    History of pneumonia    Hypertension    Hypothyroidism    Internal hemorrhoids     Current Outpatient Medications  Medication Sig Dispense Refill   aspirin 81 MG tablet Take 81 mg by mouth every morning.      carvedilol (COREG) 25 MG tablet Take 1 tablet (25 mg total) by mouth 2 (two) times daily with a meal. 180 tablet 3   cholecalciferol (VITAMIN D) 1000 UNITS tablet Take 1,000 Units by mouth every morning.      cloNIDine (CATAPRES) 0.3 MG tablet Take 1 tablet (0.3 mg total) by mouth 3  (three) times daily. 90 tablet 0   clopidogrel (PLAVIX) 75 MG tablet Take 75 mg by mouth daily.     clotrimazole-betamethasone (LOTRISONE) cream Apply topically.     famotidine (PEPCID) 20 MG tablet Take 20 mg by mouth daily.     gabapentin (NEURONTIN) 100 MG capsule Take 1 capsule (100 mg total) by mouth 3 (three) times daily. 90 capsule 2   glimepiride (AMARYL) 1 MG tablet Take 1 tablet (1 mg total) by mouth every morning. 30 tablet 3   hydrALAZINE (APRESOLINE) 100 MG tablet Take 1 tablet (100 mg total) by mouth 3 (three) times daily. 90 tablet 0   hydrochlorothiazide (HYDRODIURIL) 25 MG tablet Take 1 tablet (25 mg total) by mouth daily. 30 tablet 1   hydrOXYzine (ATARAX) 50 MG tablet Take 1 tablet (50 mg total) by mouth every 8 (eight) hours as needed. 30 tablet 0   isosorbide mononitrate (IMDUR) 60 MG 24 hr tablet Take 1 tablet (60 mg total) by mouth daily. 30 tablet 0   levothyroxine (SYNTHROID) 25 MCG tablet Take 1 tablet (25 mcg total) by mouth daily. 30 tablet 2   lovastatin (MEVACOR) 20 MG tablet Take 20 mg by mouth at bedtime.     Multiple Vitamin (MULTIVITAMIN) tablet Take 1 tablet by mouth every morning.      Oxycodone HCl 10 MG TABS Take 10 mg by mouth 4 (four) times daily as needed (pain).  OXYGEN Inhale 2 application  into the lungs at bedtime.     pantoprazole (PROTONIX) 40 MG tablet Take 1 tablet (40 mg total) by mouth daily. 30 tablet 1   polyethylene glycol (MIRALAX / GLYCOLAX) packet Take 17 g by mouth daily.     sertraline (ZOLOFT) 100 MG tablet Take 100 mg by mouth at bedtime.     STIOLTO RESPIMAT 2.5-2.5 MCG/ACT AERS INHALE 2 PUFFS INTO THE LUNGS ONCE DAILY 4 g 5   triamcinolone cream (KENALOG) 0.1 % Apply topically 2 (two) times daily as needed (eczema).     No current facility-administered medications for this encounter.    Allergies  Allergen Reactions   Amlodipine Swelling    Started very close to episode of angioedema to ACE, unknown if this added effects or  just residual   Lisinopril     angioedema   Losartan Swelling    Lip swelling    Codeine Phosphate Other (See Comments)    REACTION: UNKNOWN   Morphine And Codeine Other (See Comments)    Cannot take:Heart problems   Naproxen Nausea And Vomiting   Nsaids Nausea Only   Penicillins Other (See Comments)    UNKNOWN REACTION   Propoxyphene N-Acetaminophen Other (See Comments)    Makes me gag   Sulfamethoxazole-Trimethoprim Other (See Comments)    Gi upset   Flexeril [Cyclobenzaprine] Anxiety and Other (See Comments)    Pt states medication gives her nightmares and insomnia.      Social History   Socioeconomic History   Marital status: Widowed    Spouse name: Not on file   Number of children: 2   Years of education: Not on file   Highest education level: Not on file  Occupational History   Occupation: Disabled    Employer: UNEMPLOYED  Tobacco Use   Smoking status: Every Day    Current packs/day: 2.00    Average packs/day: 2.0 packs/day for 54.0 years (108.0 ttl pk-yrs)    Types: Cigarettes    Passive exposure: Never   Smokeless tobacco: Never   Tobacco comments:    3 cigarettes daily.  Trying to quit.  07/08/2022 hfb  Vaping Use   Vaping status: Never Used  Substance and Sexual Activity   Alcohol use: No   Drug use: No   Sexual activity: Yes    Birth control/protection: Surgical  Other Topics Concern   Not on file  Social History Narrative   Right handed    Has a son   Hostess at hotel    Social Determinants of Health   Financial Resource Strain: Low Risk  (06/24/2022)   Overall Financial Resource Strain (CARDIA)    Difficulty of Paying Living Expenses: Not very hard  Food Insecurity: No Food Insecurity (06/24/2022)   Hunger Vital Sign    Worried About Running Out of Food in the Last Year: Never true    Ran Out of Food in the Last Year: Never true  Transportation Needs: No Transportation Needs (06/24/2022)   PRAPARE - Administrator, Civil Service  (Medical): No    Lack of Transportation (Non-Medical): No  Physical Activity: Unknown (11/28/2019)   Exercise Vital Sign    Days of Exercise per Week: Not on file    Minutes of Exercise per Session: 20 min  Stress: Stress Concern Present (11/28/2019)   [employer] of Occupational Health - Occupational Stress Questionnaire    Feeling of Stress : Rather much  Social Connections: Not on file  Intimate  Partner Violence: Not At Risk (06/24/2022)   Humiliation, Afraid, Rape, and Kick questionnaire    Fear of Current or Ex-Partner: No    Emotionally Abused: No    Physically Abused: No    Sexually Abused: No      Family History  Problem Relation Age of Onset   Prostate cancer Father    Hypertension Mother    Heart attack Mother    Heart disease Mother    Kidney disease Mother 54   Stomach cancer Son    Diabetes Maternal Aunt    CAD Maternal Aunt    Stroke Maternal Aunt    Heart disease Maternal Uncle    CAD Maternal Uncle    Stroke Maternal Uncle    Colon cancer Neg Hx    Esophageal cancer Neg Hx    Rectal cancer Neg Hx     PHYSICAL EXAM: Vitals:   08/04/22 1420  BP: (!) 170/80  Pulse: 71  SpO2: 97%   GENERAL: Well nourished, well developed, and in no apparent distress at rest.  HEENT: Negative for arcus senilis or xanthelasma. There is no scleral icterus.  The mucous membranes are pink and moist.   NECK: Supple, No masses. Normal carotid upstrokes without bruits. No masses or thyromegaly.    CHEST: There are no chest wall deformities. There is no chest wall tenderness. Respirations are unlabored.  Lungs- CTA B/L CARDIAC:  JVP: 7 cm H2O         Normal S1, S2  Normal rate with regular rhythm. No murmurs, rubs or gallops.  Pulses are 2+ and symmetrical in upper and lower extremities. No edema.  ABDOMEN: Soft, non-tender, non-distended. There are no masses or hepatomegaly. There are normal bowel sounds.  EXTREMITIES: Warm and well perfused with no cyanosis, clubbing.   LYMPHATIC: No axillary or supraclavicular lymphadenopathy.  NEUROLOGIC: Patient is oriented x3 with no focal or lateralizing neurologic deficits.  PSYCH: Patients affect is appropriate, there is no evidence of anxiety or depression.  SKIN: Warm and dry; no lesions or wounds.   DATA REVIEW  ECG: 07/11/22: atrial paced, LVH as per my personal interpretation  ECHO: 06/23/22: Normal LVEF, severe LVH as per my personal interpretation  ASSESSMENT & PLAN:  Heart failure with preserved ejection fraction -SevereLVH by echocardiogram; myeloma panel ordered previously. - Myeloma panel unremarkable on 07/11/22.  -Spironolactone 25 mg daily -Hydralazine 100 mg 3 times daily with Imdur 60 mg -Start Jardiance 10 mg. Repeat labs today.   2.  Hypertension -Longstanding history of uncontrolled hypertension, moderate left renal artery stenosis on CTA from June 2024. -Referred to hypertension clinic.  - Obtain renal artery dopplers.  - Continue hydralazine/imdur as above.  - start doxazosin 1mg ; very averse to trying amlodipine right now due to hx of angioedema, although, I believe it was secondary to ARB/ACEi.  - Establish care with Dr. Duke Salvia; may be a good candidate for renal artery denervation due to history of noncompliance.   3.Chronic respiratory failure/COPD -On chronic O2 at night and with activity -Has not completed PFTs. Evidence of emphysema noted on prior imaging -Needs to quit smoking. Has been cutting back gradually.    4. DM II -Last A1c was 7.0 -start jardiance 10mg    5. 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 Sinus node dysfunction s/p PPM Afib -She's had very brief Afib < 5 min.  Followed by EP. If has recurrence will need anticoagulation.   7. CKD IIIb/IV -Prior baseline Cr 1.3-1.5 but she's had several  AKIs -Etiology HTN? -Most recently Scr has been 1.7-2s. -May need Nephrology referral -Labs today   [name] Advanced Heart Failure Mechanical Circulatory Support

## 2022-08-04 NOTE — Addendum Note (Signed)
Encounter addended by: Noralee Space, RN on: 08/04/2022 3:16 PM  Actions taken: Pharmacy for encounter modified, Order list changed

## 2022-08-04 NOTE — Patient Instructions (Addendum)
Medication Changes:  START Doxazosin 1 mg Daily  START Jardiance 10 mg Daily Your provider has prescribed jardiance for you. Please be aware the most common side effect of this medication is urinary tract infections and yeast infections. Please practice good hygiene and keep this area clean and dry to help prevent this. If you do begin to have symptoms of these infections, such as difficulty urinating or painful urination,  please let us know.   Lab Work:  Labs done today, we will call you for abnormal results  Testing/Procedures:  You have been ordered a PYP Scan.  This is done in the Radiology Department of [facility].  When you come for this test please plan to be there 2-3 hours. You will be called to schedule this.  Your physician has requested that you have a renal artery duplex. During this test, an ultrasound is used to evaluate blood flow to the kidneys. Allow one hour for this exam. Do not eat after midnight the day before and avoid carbonated beverages. Take your medications as you usually do. You will be called to schedule this.  Referrals:  Keep appointment as scheduled with the [facility] on 10/09/22  Special Instructions // Education:  Do the following things EVERYDAY: Weigh yourself in the morning before breakfast. Write it down and keep it in a log. Take your medicines as prescribed Eat low salt foods--Limit salt (sodium) to 2000 mg per day.  Stay as active as you can everyday Limit all fluids for the day to less than 2 liters  Your physician has requested that you regularly monitor and record your blood pressure readings at home. Please use the same machine at the same time of day to check your readings and record them to bring to your follow-up visit.   Follow-Up in: 2 months  At the Advanced Heart Failure Clinic, you and your health needs are our priority. We have a designated team specialized in the treatment of Heart Failure. This  Care Team includes your primary Heart Failure Specialized Cardiologist (physician), Advanced Practice Providers (APPs- Physician Assistants and Nurse Practitioners), and Pharmacist who all work together to provide you with the care you need, when you need it.   You may see any of the following providers on your designated Care Team at your next follow up:  Dr. Arvilla Meres Dr. Marca Ancona Dr. Marcos Eke, NP Robbie Lis, [place] [facility] [place], [place] Brynda Peon, NP Karle Plumber, PharmD   Please be sure to bring in all your medications bottles to every appointment.   Need to Contact us:  If you have any questions or concerns before your next appointment please send [place] a message through [place] or call our office at [phone].    TO LEAVE A MESSAGE FOR THE NURSE SELECT OPTION 2, PLEASE LEAVE A MESSAGE INCLUDING: YOUR NAME DATE OF BIRTH CALL BACK NUMBER REASON FOR CALL**this is important as we prioritize the call backs  YOU WILL RECEIVE A CALL BACK THE SAME DAY AS LONG AS YOU CALL BEFORE 4:00 PM

## 2022-08-05 ENCOUNTER — Other Ambulatory Visit (HOSPITAL_COMMUNITY): Payer: Self-pay | Admitting: Physician Assistant

## 2022-08-07 ENCOUNTER — Ambulatory Visit: Payer: 59 | Admitting: Pulmonary Disease

## 2022-08-07 ENCOUNTER — Encounter: Payer: Self-pay | Admitting: Pulmonary Disease

## 2022-08-07 VITALS — BP 134/72 | HR 70 | Ht 65.5 in | Wt 187.0 lb

## 2022-08-07 DIAGNOSIS — R0609 Other forms of dyspnea: Secondary | ICD-10-CM

## 2022-08-07 NOTE — Progress Notes (Signed)
@Patient  ID: [name], female    DOB: 03-Oct-1950, 72 y.o.   MRN: [id number]  Chief Complaint  Patient presents with   Follow-up    F/U on COPD. Still using 2L of O2.      Referring provider: [name], MD  HPI:   72 y.o. woman whom we are seeing in follow up for evaluation of history of oxygen use and DOE.  Recent discharge summary reviewed.  Most recent pulmonary note Kristy Oaks, NP reviewed.  Overall doing well.  Was hospitalized a couple months ago.  Abrupt onset shortness of breath.  Severely hypertensive.  Chest x-ray on my review interpretation revealed signs of volume overload with interlobular septal thickening.  CT chest demonstrated the same concerning for volume overload.  She was diuresed.  Blood pressure was well-controlled.  Her breathing improved.  She continues 1 L oxygen at night.  Has oxygen at home but does not really use it during the day.  Does not desaturate.  Recently seen by cardiology.  Most recent note reviewed.  Changing antihypertensives.  Was quite hypertensive at office visit 7/15.  Blood pressure is better today on measurement.  She is elected breathing stable.  Doing well.  No concerns.  She thinks that Stiolto is helping some.  PFTs yet to be completed, scheduled for next month.  HPI at initial visit: Patient was mid to the hospital 06/2020 with severe allergic/near anaphylactic reaction.  Seem to be the antibiotic.  VBG showed hypercarbia PCO2 in the 60s.  Reportedly hypoxemic.  Very wheezy.  Treated with supportive care.  Received antihistamines, epinephrine injection, steroids.  Gradually improved.  Was discharged on 2 L oxygen after approximate 24 hours in the hospital.  Relatively fast discharge especially with new oxygen requirement.  Quickly stopped using at home.  Notes checks her oxygen saturation.  Lowest this gets as 93% during the day when she is walking around.  She continues 1 L at night.  She does this because she was told to.  No  overnight oximetry.  She endorses ongoing smoking of cigarettes.  Has cut down.  Wishes to decrease further.  Cites son's illness in [place] stressor.  She denies any real breathing issues.  Prior to that acute illness, no breathing issues.  No respiratory issues.  No dyspnea on exertion.  He is not limited in any way from exertional capacity.  Occasional cough but not bothersome.  Never had PFTs per her report.  Review chest x-ray during admission 06-2020 on my interpretation reveals clear lungs bilaterally.   PMH: Tobacco abuse in remission Surgical history: Hysterectomy, tubal ligation, hernia repair, endarterectomy Family history: Father with prostate cancer, mother with CAD, hypertension, CKD Social history: Current smoker, does not have a pack a day.  50-pack-year smoking history, smoked a pack a day for 50 years before cutting down recently, lives in [place] / Pulmonary Flowsheets:   ACT:      No data to display          MMRC:     No data to display          Epworth:      No data to display          Tests:   FENO:  No results found for: "NITRICOXIDE"  PFT:     No data to display          WALK:      No data to display  Imaging: Personally reviewed and as per EMR discussion this note  Lab Results: Personally reviewed, elevated eosinophils notably CBC    Component Value Date/Time   WBC 8.9 06/25/2022 0125   RBC 4.48 06/25/2022 0125   HGB 13.5 06/25/2022 0125   HCT 41.2 06/25/2022 0125   PLT 225 06/25/2022 0125   MCV 92.0 06/25/2022 0125   MCH 30.1 06/25/2022 0125   MCHC 32.8 06/25/2022 0125   RDW 18.5 (H) 06/25/2022 0125   LYMPHSABS 1.0 06/23/2022 0249   MONOABS 0.3 06/23/2022 0249   EOSABS 0.0 06/23/2022 0249   BASOSABS 0.0 06/23/2022 0249    BMET    Component Value Date/Time   NA 141 08/04/2022 1516   K 3.4 (L) 08/04/2022 1516   CL 102 08/04/2022 1516   CO2 29 08/04/2022 1516   GLUCOSE 214 (H)  08/04/2022 1516   BUN 45 (H) 08/04/2022 1516   CREATININE 2.18 (H) 08/04/2022 1516   CALCIUM 9.3 08/04/2022 1516   GFRNONAA 23 (L) 08/04/2022 1516   GFRAA 48 (L) 09/19/2019 0140    BNP    Component Value Date/Time   BNP 163.6 (H) 08/04/2022 1516    ProBNP No results found for: "PROBNP"  Specialty Problems       Pulmonary Problems   OBSTRUCTIVE SLEEP APNEA    Qualifier: Diagnosis of  By: Jonny Ruiz MD, Len Blalock       ASTHMATIC BRONCHITIS, ACUTE    Qualifier: Diagnosis of  By: Jonny Ruiz MD, Len Blalock       Hypoxia   COPD (chronic obstructive pulmonary disease) (HCC)   Acute respiratory failure with hypoxia and hypercarbia (HCC)   COPD with acute exacerbation (HCC)   Chronic respiratory failure with hypoxia (HCC)    Allergies  Allergen Reactions   Amlodipine Swelling    Started very close to episode of angioedema to ACE, unknown if this added effects or just residual   Lisinopril     angioedema   Losartan Swelling    Lip swelling    Codeine Phosphate Other (See Comments)    REACTION: UNKNOWN   Morphine And Codeine Other (See Comments)    Cannot take:Heart problems   Naproxen Nausea And Vomiting   Nsaids Nausea Only   Penicillins Other (See Comments)    UNKNOWN REACTION   Propoxyphene N-Acetaminophen Other (See Comments)    Makes me gag   Sulfamethoxazole-Trimethoprim Other (See Comments)    Gi upset   Flexeril [Cyclobenzaprine] Anxiety and Other (See Comments)    Pt states medication gives her nightmares and insomnia.    Immunization History  Administered Date(s) Administered   H1N1 02/16/2008   Influenza Whole 11/22/2008   PFIZER(Purple Top)SARS-COV-2 Vaccination 06/25/2019, 08/22/2019   PNEUMOCOCCAL CONJUGATE-20 06/25/2022   Pfizer Covid-19 Vaccine Bivalent Booster 52yrs & up 02/24/2020, 09/21/2020   Pneumococcal Polysaccharide-23 05/21/2006    Past Medical History:  Diagnosis Date   Anemia    Anxiety    Arthritis    Atrial fib/flutter, transient (HCC)     Cataract    COPD (chronic obstructive pulmonary disease) (HCC)    Depression    Diabetes mellitus    Diverticulosis    DJD (degenerative joint disease)    GERD (gastroesophageal reflux disease)    Gout    History of pneumonia    Hypertension    Hypothyroidism    Internal hemorrhoids     Tobacco History: Social History   Tobacco Use  Smoking Status Every Day   Current packs/day: 2.00   Average  packs/day: 2.0 packs/day for 54.0 years (108.0 ttl pk-yrs)   Types: Cigarettes   Passive exposure: Never  Smokeless Tobacco Never  Tobacco Comments   3 cigarettes daily.  Trying to quit.  07/08/2022 hfb   Ready to quit: Not Answered Counseling given: Not Answered Tobacco comments: 3 cigarettes daily.  Trying to quit.  07/08/2022 hfb   Outpatient Encounter Medications as of 08/07/2022  Medication Sig   aspirin 81 MG tablet Take 81 mg by mouth every morning.    carvedilol (COREG) 25 MG tablet Take 1 tablet (25 mg total) by mouth 2 (two) times daily with a meal.   cholecalciferol (VITAMIN D) 1000 UNITS tablet Take 1,000 Units by mouth every morning.    cloNIDine (CATAPRES) 0.3 MG tablet Take 1 tablet (0.3 mg total) by mouth 3 (three) times daily.   clopidogrel (PLAVIX) 75 MG tablet Take 75 mg by mouth daily.   clotrimazole-betamethasone (LOTRISONE) cream Apply topically.   doxazosin (CARDURA) 1 MG tablet Take 1 tablet (1 mg total) by mouth daily.   empagliflozin (JARDIANCE) 10 MG TABS tablet Take 1 tablet (10 mg total) by mouth daily before breakfast.   famotidine (PEPCID) 20 MG tablet Take 20 mg by mouth daily.   gabapentin (NEURONTIN) 100 MG capsule Take 1 capsule (100 mg total) by mouth 3 (three) times daily.   glimepiride (AMARYL) 1 MG tablet Take 1 tablet (1 mg total) by mouth every morning.   hydrALAZINE (APRESOLINE) 100 MG tablet Take 1 tablet (100 mg total) by mouth 3 (three) times daily.   hydrochlorothiazide (HYDRODIURIL) 25 MG tablet Take 1 tablet (25 mg total) by mouth  daily.   hydrOXYzine (ATARAX) 50 MG tablet Take 1 tablet (50 mg total) by mouth every 8 (eight) hours as needed.   isosorbide mononitrate (IMDUR) 60 MG 24 hr tablet TAKE 1 TABLET BY MOUTH DAILY   levothyroxine (SYNTHROID) 25 MCG tablet Take 1 tablet (25 mcg total) by mouth daily.   lovastatin (MEVACOR) 20 MG tablet Take 20 mg by mouth at bedtime.   Multiple Vitamin (MULTIVITAMIN) tablet Take 1 tablet by mouth every morning.    Oxycodone HCl 10 MG TABS Take 10 mg by mouth 4 (four) times daily as needed (pain).   OXYGEN Inhale 2 application  into the lungs at bedtime.   pantoprazole (PROTONIX) 40 MG tablet Take 1 tablet (40 mg total) by mouth daily.   polyethylene glycol (MIRALAX / GLYCOLAX) packet Take 17 g by mouth daily.   sertraline (ZOLOFT) 100 MG tablet Take 100 mg by mouth at bedtime.   STIOLTO RESPIMAT 2.5-2.5 MCG/ACT AERS INHALE 2 PUFFS INTO THE LUNGS ONCE DAILY   triamcinolone cream (KENALOG) 0.1 % Apply topically 2 (two) times daily as needed (eczema).   No facility-administered encounter medications on file as of 08/07/2022.     Review of Systems  Review of Systems  N/a Physical Exam  BP 134/72   Pulse 70   Ht 5' 5.5" (1.664 m)   Wt 187 lb (84.8 kg)   SpO2 99% Comment: on RA  BMI 30.65 kg/m   Wt Readings from Last 5 Encounters:  08/07/22 187 lb (84.8 kg)  08/04/22 187 lb 6.4 oz (85 kg)  07/14/22 187 lb (84.8 kg)  07/11/22 188 lb (85.3 kg)  07/08/22 187 lb 9.6 oz (85.1 kg)    BMI Readings from Last 5 Encounters:  08/07/22 30.65 kg/m  08/04/22 30.71 kg/m  07/14/22 30.65 kg/m  07/11/22 30.81 kg/m  07/08/22 30.74 kg/m  Physical Exam General: Well-appearing, no acute distress Eyes: EOMI, icterus Neck: Supple, no JVP Pulmonary: Distant, clear, normal work of breathing Cardiovascular: Regular in rhythm, no murmur Abdomen: Nondistended, bowel sounds present MSK: No synovitis, no joint effusion Neuro: Normal gait, no weakness Psych: Normal mood, full  affect   Assessment & Plan:   History of severe allergic reaction/anaphylaxis: Presented with hypercarbia and hypoxemia.  Suspect this was acute bronchospasm.  Possible underlying COPD.  Emphysema seen on CT scan.  DOE: Likely multifactorial related to deconditioning as well as smoking-related disease.  Emphysema on CT scan.  More recent decompensations and then hypertensive and congestive heart failure with volume overload in the lungs.  Stiolto for bronchodilation has helped.  PFTs scheduled for next month.   Nocturnal hypoxemia: Confirmed on overnight oximetry.  Using 1 L nasal cannula to continue at night.    Return in about 3 months (around 11/07/2022) for f/u Dr. Judeth Horn.   Karren Burly, MD 08/07/2022  I spent 41 minutes on the unit patient including review of records, coordination of care, face-to-face visit.

## 2022-08-07 NOTE — Patient Instructions (Signed)
It is great to see you again  I am glad you are doing better since being in the hospital  Continue Stiolto 2 puffs once a day  I will look forward to getting the results of the breathing test next month  We should be in contact to discuss results with you  Return to clinic in 3 months or sooner as needed with Dr. Judeth Horn  Thank you for enrolling in MyChart. Please follow the instructions below to securely access your online medical record. MyChart allows you to send messages to your doctor, view your test results, renew your prescriptions, schedule appointments, and more.  How Do I Sign Up? In your Internet browser, go to [email or web address]. Click on the New  User? link in the Sign In box.  Enter your MyChart Access Code exactly as it appears below. You will not need to use this code after you have completed the sign-up process. If you do not sign up before the expiration date, you must request a new code. MyChart Access Code: [email or web address] Expires: 09/12/2022  4:11 PM  Enter the last four digits of your Social Security Number (xxxx) and Date of Birth (mm/dd/yyyy) as indicated and click Next. You will be taken to the next sign-up page. Create a MyChart ID. This will be your MyChart login ID and cannot be changed, so think of one that is secure and easy to remember. Create a Clinical biochemist. You can change your password at any time. Enter your Password Reset Question and Answer and click Next. This can be used at a later time if you forget your password.  Select your communication preference, and if applicable enter your e-mail address. You will receive e-mail notification when new information is available in MyChart by choosing to receive e-mail notifications and filling in your e-mail. Click Sign In. You can now view your medical record.

## 2022-08-14 ENCOUNTER — Ambulatory Visit (INDEPENDENT_AMBULATORY_CARE_PROVIDER_SITE_OTHER): Payer: 59

## 2022-08-14 DIAGNOSIS — I480 Paroxysmal atrial fibrillation: Secondary | ICD-10-CM

## 2022-08-14 LAB — CUP PACEART REMOTE DEVICE CHECK
Battery Impedance: 6729 Ohm
Battery Remaining Longevity: 1 mo — CL
Battery Voltage: 2.57 V
Brady Statistic AP VP Percent: 1 %
Brady Statistic AP VS Percent: 91 %
Brady Statistic AS VP Percent: 1 %
Brady Statistic AS VS Percent: 8 %
Date Time Interrogation Session: 20240725095159
Implantable Lead Connection Status: 753985
Implantable Lead Connection Status: 753985
Implantable Lead Implant Date: 19970929
Implantable Lead Implant Date: 19970929
Implantable Lead Location: 753859
Implantable Lead Location: 753860
Implantable Lead Model: 4068
Implantable Lead Model: 5092
Implantable Pulse Generator Implant Date: 20090505
Lead Channel Impedance Value: 588 Ohm
Lead Channel Impedance Value: 685 Ohm
Lead Channel Setting Pacing Amplitude: 2.5 V
Lead Channel Setting Pacing Amplitude: 2.5 V
Lead Channel Setting Pacing Pulse Width: 0.4 ms
Lead Channel Setting Sensing Sensitivity: 5.6 mV
Zone Setting Status: 755011
Zone Setting Status: 755011

## 2022-08-18 ENCOUNTER — Other Ambulatory Visit: Payer: Self-pay

## 2022-08-25 NOTE — Progress Notes (Signed)
Remote pacemaker transmission.   

## 2022-08-26 ENCOUNTER — Telehealth (HOSPITAL_COMMUNITY): Payer: Self-pay | Admitting: Cardiology

## 2022-08-26 ENCOUNTER — Other Ambulatory Visit: Payer: Self-pay

## 2022-08-26 ENCOUNTER — Other Ambulatory Visit (HOSPITAL_COMMUNITY): Payer: Self-pay | Admitting: Physician Assistant

## 2022-08-26 DIAGNOSIS — R0609 Other forms of dyspnea: Secondary | ICD-10-CM

## 2022-08-26 MED ORDER — FLUCONAZOLE 150 MG PO TABS: 150.0000 mg | ORAL_TABLET | Freq: Once | ORAL | 0 refills | Status: AC

## 2022-08-26 NOTE — Telephone Encounter (Signed)
Pt returned call and reports she is allergic to benadryl  Also requests meds for yeast infection for vaginal itching

## 2022-08-26 NOTE — Telephone Encounter (Signed)
Patient called to report since starting two new meds (doxazosin and jardiance)  Reports itching all over under skin. Denies hives or rash or redness. Denies vaginal itching  Held medication for 1-2 days and symptoms were worse. Not OTC meds used   Please advise

## 2022-08-26 NOTE — Telephone Encounter (Signed)
She can try increasing her famotidine to 20 mg BID if she can't tolerate benadryl. She is already taking hydroxyzine, which should also help with itching.   She can take Fluconazole 150 mg x1 dose for yeast infection. If she has an issue with fluconazole (not on allergy list and has had it before, so hopefully no issues) she can try OTC topical miconazole (Monistat).

## 2022-08-26 NOTE — Telephone Encounter (Signed)
Pt aware and voiced understanding Will hold jardiance and cardura x 1 week Use fluconazole 150 mg x 1 dose Increase pepcid to 20 mg twice a day and hydroxyzine until itching is resolved  Will restart cardura in one week

## 2022-08-26 NOTE — Telephone Encounter (Signed)
Would try OTC benadryl and hydrocortisone cream to help with itching. Would be reasonable to stop both medications for 1 week to see if improves. If itching resolves, would try and restart doxazosin as it is less likely to cause this reaction than the Jardiance. If recurs after restarting doxazosin, could retry sglt2i in the future.

## 2022-08-27 ENCOUNTER — Ambulatory Visit (INDEPENDENT_AMBULATORY_CARE_PROVIDER_SITE_OTHER): Payer: 59 | Admitting: Pulmonary Disease

## 2022-08-27 DIAGNOSIS — R0609 Other forms of dyspnea: Secondary | ICD-10-CM | POA: Diagnosis not present

## 2022-08-27 LAB — PULMONARY FUNCTION TEST
DL/VA % pred: 46 %
DL/VA: 1.9 ml/min/mmHg/L
DLCO cor % pred: 36 %
DLCO cor: 7.36 ml/min/mmHg
DLCO unc % pred: 36 %
DLCO unc: 7.36 ml/min/mmHg
FEF 25-75 Post: 0.97 L/sec
FEF 25-75 Pre: 1.08 L/sec
FEF2575-%Change-Post: -9 %
FEF2575-%Pred-Post: 52 %
FEF2575-%Pred-Pre: 57 %
FEV1-%Change-Post: -2 %
FEV1-%Pred-Post: 60 %
FEV1-%Pred-Pre: 61 %
FEV1-Post: 1.39 L
FEV1-Pre: 1.42 L
FEV1FVC-%Change-Post: 4 %
FEV1FVC-%Pred-Pre: 99 %
FEV6-%Change-Post: -6 %
FEV6-%Pred-Post: 60 %
FEV6-%Pred-Pre: 64 %
FEV6-Post: 1.76 L
FEV6-Pre: 1.88 L
FEV6FVC-%Change-Post: 0 %
FEV6FVC-%Pred-Post: 104 %
FEV6FVC-%Pred-Pre: 104 %
FVC-%Change-Post: -6 %
FVC-%Pred-Post: 57 %
FVC-%Pred-Pre: 61 %
FVC-Post: 1.76 L
FVC-Pre: 1.89 L
Post FEV1/FVC ratio: 79 %
Post FEV6/FVC ratio: 100 %
Pre FEV1/FVC ratio: 75 %
Pre FEV6/FVC Ratio: 100 %
RV % pred: 101 %
RV: 2.32 L
TLC % pred: 85 %
TLC: 4.46 L

## 2022-08-27 NOTE — Patient Instructions (Signed)
Full PFT performed today. °

## 2022-08-27 NOTE — Progress Notes (Signed)
Full PFT performed today. °

## 2022-08-28 ENCOUNTER — Ambulatory Visit (HOSPITAL_COMMUNITY): Admission: RE | Admit: 2022-08-28 | Payer: 59 | Source: Ambulatory Visit

## 2022-09-08 ENCOUNTER — Ambulatory Visit (HOSPITAL_COMMUNITY): Admission: RE | Admit: 2022-09-08 | Payer: 59 | Source: Ambulatory Visit

## 2022-09-15 ENCOUNTER — Encounter (HOSPITAL_COMMUNITY): Payer: 59

## 2022-09-15 ENCOUNTER — Ambulatory Visit: Payer: 59

## 2022-09-15 DIAGNOSIS — I480 Paroxysmal atrial fibrillation: Secondary | ICD-10-CM

## 2022-09-15 DIAGNOSIS — I5032 Chronic diastolic (congestive) heart failure: Secondary | ICD-10-CM

## 2022-09-15 LAB — CUP PACEART REMOTE DEVICE CHECK
Battery Impedance: 7658 Ohm
Battery Remaining Longevity: 1 mo — CL
Battery Voltage: 2.59 V
Brady Statistic AP VP Percent: 1 %
Brady Statistic AP VS Percent: 90 %
Brady Statistic AS VP Percent: 1 %
Brady Statistic AS VS Percent: 8 %
Date Time Interrogation Session: 20240826120052
Implantable Lead Connection Status: 753985
Implantable Lead Connection Status: 753985
Implantable Lead Implant Date: 19970929
Implantable Lead Implant Date: 19970929
Implantable Lead Location: 753859
Implantable Lead Location: 753860
Implantable Lead Model: 4068
Implantable Lead Model: 5092
Implantable Pulse Generator Implant Date: 20090505
Lead Channel Impedance Value: 581 Ohm
Lead Channel Impedance Value: 683 Ohm
Lead Channel Setting Pacing Amplitude: 2.5 V
Lead Channel Setting Pacing Amplitude: 2.5 V
Lead Channel Setting Pacing Pulse Width: 0.4 ms
Lead Channel Setting Sensing Sensitivity: 4 mV
Zone Setting Status: 755011
Zone Setting Status: 755011

## 2022-09-19 ENCOUNTER — Ambulatory Visit (HOSPITAL_COMMUNITY)
Admission: RE | Admit: 2022-09-19 | Discharge: 2022-09-19 | Disposition: A | Discharge status: Home / Self Care | Payer: 59 | Source: Ambulatory Visit | Attending: Cardiology | Admitting: Cardiology

## 2022-09-19 DIAGNOSIS — I10 Essential (primary) hypertension: Secondary | ICD-10-CM

## 2022-09-19 DIAGNOSIS — I5032 Chronic diastolic (congestive) heart failure: Secondary | ICD-10-CM | POA: Diagnosis not present

## 2022-09-24 NOTE — Progress Notes (Signed)
Remote pacemaker transmission.   

## 2022-10-09 ENCOUNTER — Ambulatory Visit (HOSPITAL_BASED_OUTPATIENT_CLINIC_OR_DEPARTMENT_OTHER): Payer: 59 | Admitting: Family

## 2022-10-09 ENCOUNTER — Encounter (HOSPITAL_BASED_OUTPATIENT_CLINIC_OR_DEPARTMENT_OTHER): Payer: Self-pay | Admitting: Family

## 2022-10-09 VITALS — BP 132/74 | HR 65 | Wt 188.9 lb

## 2022-10-09 DIAGNOSIS — I5032 Chronic diastolic (congestive) heart failure: Secondary | ICD-10-CM

## 2022-10-09 DIAGNOSIS — J9611 Chronic respiratory failure with hypoxia: Secondary | ICD-10-CM | POA: Diagnosis not present

## 2022-10-09 DIAGNOSIS — N184 Chronic kidney disease, stage 4 (severe): Secondary | ICD-10-CM | POA: Diagnosis not present

## 2022-10-09 DIAGNOSIS — I10 Essential (primary) hypertension: Secondary | ICD-10-CM | POA: Diagnosis not present

## 2022-10-09 MED ORDER — CLONIDINE 0.3 MG/24HR TD PTWK
0.3000 mg | MEDICATED_PATCH | TRANSDERMAL | 12 refills | Status: DC
Start: 2022-10-09 — End: 2023-03-19

## 2022-10-09 NOTE — Progress Notes (Signed)
Advanced Hypertension Clinic Assessment:    Date:  10/13/2022   ID:  [name], DOB 11/11/1950, MRN [id number]  PCP:  Kristy Livings, MD  Cardiologist:  None  Nephrologist: Dr. Malen [name]  Referring MD: [name], *   CC: Hypertension  History of Present Illness:    [name] is a 72 y.o. 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   Previously seen in [facility]pert[facility] Clinic 11/28/19 by Dr. Duke [name].  Initially diagnosed with hypertension in her late 30s.  Prior CT 09/2019 with no adenomas. Prior carotid duplex 01/2020 with <50% stenosis bilaterally.  Admission 06/2022 for respiratory distress in the setting of hypertensive crisis and pulmonary edema requiring BiPAP briefly.  She was diuresed with IV Lasix.  Course also notable for AKI on CKD, CTA no evidence of aortic dissection, stable ascending aortic aneurysm 4.1 cm, stable left EIA stent, moderate stenosis left renal artery with patent right renal artery.  Echo LVEF 60 to 65%, moderate to severe LVH (recommended for workup for infiltrative disease due to LVH and low GLS). CT 06/2022 with normal adrenal glands.   She has heart and vascular transition of care clinic 07/11/2022.  Myeloma panel, urine immunofixation and serum free light chains were ordered and unremarkable.  Spironolactone added for GDMT of HFpEF.  She was additionally recommended for sleep study.  At follow-up with Dr. Gasper [name] 08/04/22 Jardiance was added. Doxazosin initiated for BP control.  Renal duplex 09/19/2022 with 1-59% stenosis of right renal artery and no left renal artery stenosis.  She  presents today for follow up. [name] was diagnosed with hypertension in her late 43s.  Blood pressure checked with arm cuff at home. Readings have been 130s/70s. It is similar to what her [facility] nurse checks as well. she reports tobacco use currently however is working to decrease. For exercise she she has no formal routine but does live alone and does all of her household tasks. she eats at home and outside of the home and does not follow low sodium diet.   Notes exertional dyspnea over the last 3 weeks mildly worse than baseline. No coughing, wheezing. No edema, orthopnea, PND. No chest pain, pressure, tightness. She is weighing herself routinely at home with weight stable within 3 pounds. Weight in clinic today up 1.9 lbs from her last clinic with Heart Failure clinic.   Takes her initial medication around 9:30 am, 4 pm, 8 pm. Is not bothered by TID dosing and endorses compliance.    Previous antihypertensives: ACE/ARB (Lisiopril, Losartan)-angioedema Amlodipine    Past Medical History:  Diagnosis Date   Anemia    Anxiety    Arthritis    Atrial fib/flutter, transient (HCC)    Cataract    COPD (chronic obstructive pulmonary disease) (HCC)    Depression    Diabetes mellitus    Diverticulosis    DJD (degenerative joint disease)    GERD (gastroesophageal reflux disease)    Gout    History of pneumonia    Hypertension    Hypothyroidism    Internal hemorrhoids     Past Surgical History:  Procedure Laterality Date   ABDOMINAL AORTOGRAM W/LOWER EXTREMITY N/A 07/24/2020  Procedure: ABDOMINAL AORTOGRAM W/LOWER EXTREMITY;  Surgeon: Nada Libman, MD;  Location: [facility];  Service: Cardiovascular;  Laterality: N/A;   CAROTID STENT  05/2021   CATARACT EXTRACTION W/ INTRAOCULAR LENS IMPLANT Left    ENDARTERECTOMY Left 09/09/2019   Procedure: LEFT ILIAC ENDARTERECTOMY;  Surgeon: Nada Libman, MD;  Location: [facility] OR;  Service: Vascular;  Laterality: Left;   HERNIA  REPAIR     INSERTION OF ILIAC STENT Left 09/09/2019   Procedure: INSERTION OF LEFT ILIAC STENT;  Surgeon: Nada Libman, MD;  Location: [facility] OR;  Service: Vascular;  Laterality: Left;   PACEMAKER INSERTION     PERIPHERAL VASCULAR INTERVENTION Left 07/24/2020   Procedure: PERIPHERAL VASCULAR INTERVENTION;  Surgeon: Nada Libman, MD;  Location: [facility];  Service: Cardiovascular;  Laterality: Left;   THORACIC AORTIC ENDOVASCULAR STENT GRAFT N/A 09/09/2019   Procedure: THORACIC AORTIC ENDOVASCULAR STENT GRAFT WITH LEFT ILIAC CONDUIT;  Surgeon: Nada Libman, MD;  Location: [facility] OR;  Service: Vascular;  Laterality: N/A;   TUBAL LIGATION     ULTRASOUND GUIDANCE FOR VASCULAR ACCESS Left 09/09/2019   Procedure: ULTRASOUND GUIDANCE FOR VASCULAR ACCESS;  Surgeon: Nada Libman, MD;  Location: [facility] OR;  Service: Vascular;  Laterality: Left;   VAGINAL HYSTERECTOMY      Current Medications: Current Meds  Medication Sig   aspirin 81 MG tablet Take 81 mg by mouth every morning.    carvedilol (COREG) 25 MG tablet Take 1 tablet (25 mg total) by mouth 2 (two) times daily with a meal.   cholecalciferol (VITAMIN D) 1000 UNITS tablet Take 1,000 Units by mouth every morning.    cloNIDine (CATAPRES - DOSED IN MG/24 HR) 0.3 mg/24hr patch Place 1 patch (0.3 mg total) onto the skin once a week.   clopidogrel (PLAVIX) 75 MG tablet Take 75 mg by mouth daily.   clotrimazole-betamethasone (LOTRISONE) cream Apply topically.   doxazosin (CARDURA) 1 MG tablet Take 1 tablet (1 mg total) by mouth daily.   empagliflozin (JARDIANCE) 10 MG TABS tablet Take 1 tablet (10 mg total) by mouth daily before breakfast.   famotidine (PEPCID) 20 MG tablet Take 20 mg by mouth daily.   gabapentin (NEURONTIN) 100 MG capsule Take 1 capsule (100 mg total) by mouth 3 (three) times daily.   glimepiride (AMARYL) 1 MG tablet Take 1 tablet (1 mg total) by mouth every morning.   hydrALAZINE (APRESOLINE) 100 MG tablet Take 1  tablet (100 mg total) by mouth 3 (three) times daily.   hydrochlorothiazide (HYDRODIURIL) 25 MG tablet Take 1 tablet (25 mg total) by mouth daily.   hydrOXYzine (ATARAX) 50 MG tablet Take 1 tablet (50 mg total) by mouth every 8 (eight) hours as needed.   isosorbide mononitrate (IMDUR) 60 MG 24 hr tablet TAKE 1 TABLET BY MOUTH DAILY   levothyroxine (SYNTHROID) 150 MCG tablet Take 150 mcg by mouth daily.   levothyroxine (SYNTHROID) 25 MCG tablet Take 1 tablet (25 mcg total) by mouth daily.   lovastatin (MEVACOR) 20 MG tablet Take 20 mg by mouth at bedtime.   Multiple Vitamin (MULTIVITAMIN) tablet Take 1 tablet by mouth every morning.    Oxycodone HCl 10 MG TABS Take 10 mg by mouth 4 (four) times daily as needed (pain).   OXYGEN Inhale 2 application  into the lungs at bedtime.   pantoprazole (PROTONIX) 40 MG tablet Take 1 tablet (40 mg total) by mouth daily.   polyethylene glycol (MIRALAX / GLYCOLAX) packet Take  17 g by mouth daily.   sertraline (ZOLOFT) 100 MG tablet Take 100 mg by mouth at bedtime.   STIOLTO RESPIMAT 2.5-2.5 MCG/ACT AERS INHALE 2 PUFFS INTO THE LUNGS ONCE DAILY   triamcinolone cream (KENALOG) 0.1 % Apply topically 2 (two) times daily as needed (eczema).   [DISCONTINUED] cloNIDine (CATAPRES) 0.3 MG tablet Take 1 tablet (0.3 mg total) by mouth 3 (three) times daily.     Allergies:   Amlodipine, Lisinopril, Losartan, Codeine phosphate, Morphine and codeine, Naproxen, Nsaids, Penicillins, Propoxyphene n-acetaminophen, Sulfamethoxazole-trimethoprim, and Flexeril [cyclobenzaprine]   Social History   Socioeconomic History   Marital status: Widowed    Spouse name: Not on file   Number of children: 2   Years of education: Not on file   Highest education level: Not on file  Occupational History   Occupation: Disabled    Employer: UNEMPLOYED  Tobacco Use   Smoking status: Every Day    Current packs/day: 2.00    Average packs/day: 2.0 packs/day for 54.0 years (108.0 ttl pk-yrs)     Types: Cigarettes    Passive exposure: Never   Smokeless tobacco: Never   Tobacco comments:    3 cigarettes daily.  Trying to quit.  07/08/2022 hfb  Vaping Use   Vaping status: Never Used  Substance and Sexual Activity   Alcohol use: No   Drug use: No   Sexual activity: Yes    Birth control/protection: Surgical  Other Topics Concern   Not on file  Social History Narrative   Right handed    Has a son   Hostess at hotel    Social Determinants of Health   Financial Resource Strain: Low Risk  (06/24/2022)   Overall Financial Resource Strain (CARDIA)    Difficulty of Paying Living Expenses: Not very hard  Food Insecurity: No Food Insecurity (06/24/2022)   Hunger Vital Sign    Worried About Running Out of Food in the Last Year: Never true    Ran Out of Food in the Last Year: Never true  Transportation Needs: No Transportation Needs (06/24/2022)   PRAPARE - Administrator, Civil Service (Medical): No    Lack of Transportation (Non-Medical): No  Physical Activity: Inactive (10/09/2022)   Exercise Vital Sign    Days of Exercise per Week: 0 days    Minutes of Exercise per Session: 0 min  Stress: Stress Concern Present (11/28/2019)   [employer] of Occupational Health - Occupational Stress Questionnaire    Feeling of Stress : Rather much  Social Connections: Not on file     Family History: The patient's family history includes CAD in her maternal aunt and maternal uncle; Diabetes in her maternal aunt; Heart attack in her mother; Heart disease in her maternal uncle and mother; Hypertension in her mother; Kidney disease (age of onset: 27) in her mother; Prostate cancer in her father; Stomach cancer in her son; Stroke in her maternal aunt and maternal uncle. There is no history of Colon cancer, Esophageal cancer, or Rectal cancer.  ROS:   Please see the history of present illness.     All other systems reviewed and are negative.  EKGs/Labs/Other Studies Reviewed:         Recent Labs: 06/24/2022: Magnesium 2.1 06/25/2022: Hemoglobin 13.5; Platelets 225 08/04/2022: ALT 17; B Natriuretic Peptide 163.6; BUN 45; Creatinine, Ser 2.18; Potassium 3.4; Sodium 141   Recent Lipid Panel    Component Value Date/Time   CHOL 141 01/29/2020 0444   TRIG 75 06/24/2022  0110   HDL 44 01/29/2020 0444   CHOLHDL 3.2 01/29/2020 0444   VLDL 17 01/29/2020 0444   LDLCALC 80 01/29/2020 0444   LDLDIRECT 128.9 05/21/2006 0948    Physical Exam:   VS:  BP 132/74 Comment: right arm manual  Pulse 65   Wt 188 lb 14.4 oz (85.7 kg)   SpO2 93%   BMI 30.96 kg/m  , BMI Body mass index is 30.96 kg/m. GENERAL:  Well appearing HEENT: Pupils equal round and reactive, fundi not visualized, oral mucosa unremarkable NECK:  No jugular venous distention, waveform within normal limits, carotid upstroke brisk and symmetric, no bruits, no thyromegaly LYMPHATICS:  No cervical adenopathy LUNGS:  Clear to auscultation bilaterally HEART:  RRR.  PMI not displaced or sustained,S1 and S2 within normal limits, no S3, no S4, no clicks, no rubs, no murmurs ABD:  Flat, positive bowel sounds normal in frequency in pitch, no bruits, no rebound, no guarding, no midline pulsatile mass, no hepatomegaly, no splenomegaly EXT:  2 plus pulses throughout, no edema, no cyanosis no clubbing SKIN:  No rashes no nodules NEURO:  Cranial nerves II through XII grossly intact, motor grossly intact throughout PSYCH:  Cognitively intact, oriented to person place and time   ASSESSMENT/PLAN:    HTN - BP in clinic 134/76, 126/68, 132/74. Home readings similar. Nearly at goal <130/80. Will aim to consolidate her medication regimen. Stop Clonidine tablet, start Clonidine 0.3mg  patch weekly.  Present regimen: Coreg 25mg  BID, Doxazosin 1mg  daily, Hydralazine 100mg  TID, Imdur 60mg  daily, hydrochlorothiazide 25mg  daily Future considerations: Transition hydrochlorothiazide to Spironolactone due to HF benefit and intermittent  hypokalemia over the past couple years. Could escalate future Spironolactone dose or Imdur dose to allow for discontinuation of Doxazosin for simplification of regimen.  No ACE/ARB due to prior angioedema (of note, was also on Amlodipine at the time but unlikely contributory) Would be future candidate for RDN when procedure resumes.  Consider renin-aldosterone and TSH with next labs for additional secondary workup.   Chronic respiratory failure/COPD - Follows with pulmonology on home O2 and nocturnal O2. Minimal worsening of her exertional dyspnea over last 3 weeks. Lung sounds clear on exam, no hypoxemia. If progressed, she will contact her pulmonology team.   HFpEF - Euvolemic and well compensated on exam.  NYHA II with exertional dyspnea over the last 3 weeks although only minimally increased from previous. Weights at home have been stable. Continue current GDMT per Advanced Heart failure team. She will monitor symptoms and contact us if worsened.   DM2 - Continue to follow with PCP.   PAD - follows with [facility].  Tobacco use - Smoking cessation encouraged. Recommend utilization of 1800QUITNOW.    Sinus node dysfunction s/p PPM- Follows  with Dr. Ladona Ridgel of EP.   CKD 3B/IV- Careful titration of diuretic and antihypertensive.    Screening for Secondary Hypertension:     10/09/2022    5:00 PM  Causes  Drugs/Herbals N/A  Renovascular HTN Screened     - Comments Renal duplex 08/2022 no stenosis  Sleep Apnea Not Screened     - Comments Advanced Heart failure team reached out to pulmonology  Pheochromocytoma N/A     - Comments CT 06/2022 normal adrenals  Coarctation of the Aorta N/A     - Comments BP symmetric  Compliance Screened     - Comments Prior issues with compliance, no compliant    Relevant Labs/Studies:    Latest Ref Rng & Units 08/04/2022  3:16 PM 07/11/2022    3:25 PM 06/25/2022    1:25 AM  Basic Labs  Sodium 135 - 145 mmol/L 141  144  135   Potassium 3.5 - 5.1 mmol/L 3.4   4.2  3.4   Creatinine 0.44 - 1.00 mg/dL 9.32  3.55  7.32        Latest Ref Rng & Units 09/05/2019   11:18 AM 08/28/2019    3:20 PM  Thyroid   TSH 0.350 - 4.500 uIU/mL 0.111  0.121              Latest Ref Rng & Units 01/20/2012   12:34 PM  Cortisol  Cortisol  ug/dL 6.7        02/21/5425    9:57 AM  Renovascular   Renal Artery [place] Completed Yes       Disposition:    FU with MD/PharmD in 2 months    Medication Adjustments/Labs and Tests Ordered: Current medicines are reviewed at length with the patient today.  Concerns regarding medicines are outlined above.  No orders of the defined types were placed in this encounter.  Meds ordered this encounter  Medications   cloNIDine (CATAPRES - DOSED IN MG/24 HR) 0.3 mg/24hr patch    Sig: Place 1 patch (0.3 mg total) onto the skin once a week.    Dispense:  4 patch    Refill:  12    Discontinue clonidine tablet    Order Specific Question:   Supervising Provider    Answer:   Jodelle Red [[id number]]     Signed, Alver Sorrow, NP  10/13/2022 12:53 PM    [facility]

## 2022-10-09 NOTE — Patient Instructions (Addendum)
Medication Instructions:  Your physician has recommended you make the following change in your medication:   STOP Clonidine tablet  START Clonidine 0.3mg  patch weekly    Follow-Up: In November as scheduled    Special Instructions:  Please bring your blood pressure cuff and log to your next office visit.

## 2022-10-12 ENCOUNTER — Other Ambulatory Visit (HOSPITAL_COMMUNITY): Payer: Self-pay | Admitting: Cardiology

## 2022-10-16 ENCOUNTER — Ambulatory Visit (INDEPENDENT_AMBULATORY_CARE_PROVIDER_SITE_OTHER): Payer: 59

## 2022-10-16 DIAGNOSIS — I509 Heart failure, unspecified: Secondary | ICD-10-CM

## 2022-10-17 ENCOUNTER — Encounter (HOSPITAL_COMMUNITY): Payer: 59 | Admitting: Cardiology

## 2022-10-17 ENCOUNTER — Telehealth: Payer: Self-pay

## 2022-10-17 LAB — CUP PACEART REMOTE DEVICE CHECK
Battery Impedance: 8667 Ohm
Battery Voltage: 2.61 V
Brady Statistic RV Percent Paced: 2 %
Date Time Interrogation Session: 20240926083715
Implantable Lead Connection Status: 753985
Implantable Lead Connection Status: 753985
Implantable Lead Implant Date: 19970929
Implantable Lead Implant Date: 19970929
Implantable Lead Location: 753859
Implantable Lead Location: 753860
Implantable Lead Model: 4068
Implantable Lead Model: 5092
Implantable Pulse Generator Implant Date: 20090505
Lead Channel Impedance Value: 564 Ohm
Lead Channel Impedance Value: 67 Ohm
Lead Channel Setting Pacing Amplitude: 2.5 V
Lead Channel Setting Pacing Pulse Width: 0.4 ms
Lead Channel Setting Sensing Sensitivity: 4 mV
Zone Setting Status: 755011
Zone Setting Status: 755011

## 2022-10-17 NOTE — Telephone Encounter (Signed)
LMTCB

## 2022-10-17 NOTE — Telephone Encounter (Signed)
Device RRT on 09/22/22. Adapta. Reprogrammed VVI 65 from DDDR 70.   LMTCB

## 2022-10-20 NOTE — Telephone Encounter (Signed)
Lvmtcb to schedule with GT in [place], [employer] unavailable.

## 2022-10-23 NOTE — Telephone Encounter (Signed)
Pt scheduled to see GT.  She is aware of appt.

## 2022-10-28 NOTE — Addendum Note (Signed)
Addended by: Elease Etienne A on: 10/28/2022 03:31 PM   Modules accepted: Level of Service

## 2022-10-28 NOTE — Progress Notes (Signed)
Remote pacemaker transmission.   

## 2022-10-30 ENCOUNTER — Other Ambulatory Visit (HOSPITAL_COMMUNITY)
Admission: RE | Admit: 2022-10-30 | Discharge: 2022-10-30 | Disposition: A | Discharge status: Home / Self Care | Payer: 59 | Source: Ambulatory Visit | Attending: Internal Medicine | Admitting: Internal Medicine

## 2022-10-30 ENCOUNTER — Encounter: Payer: Self-pay | Admitting: Internal Medicine

## 2022-10-30 ENCOUNTER — Ambulatory Visit: Payer: 59 | Attending: Internal Medicine | Admitting: Internal Medicine

## 2022-10-30 VITALS — BP 150/84 | HR 74 | Ht 65.0 in | Wt 191.8 lb

## 2022-10-30 DIAGNOSIS — Z01810 Encounter for preprocedural cardiovascular examination: Secondary | ICD-10-CM

## 2022-10-30 LAB — CUP PACEART INCLINIC DEVICE CHECK
Battery Impedance: 8563 Ohm
Battery Voltage: 2.61 V
Brady Statistic RV Percent Paced: 2 %
Date Time Interrogation Session: 20241010160425
Implantable Lead Connection Status: 753985
Implantable Lead Connection Status: 753985
Implantable Lead Implant Date: 19970929
Implantable Lead Implant Date: 19970929
Implantable Lead Location: 753859
Implantable Lead Location: 753860
Implantable Lead Model: 4068
Implantable Lead Model: 5092
Implantable Pulse Generator Implant Date: 20090505
Lead Channel Impedance Value: 600 Ohm
Lead Channel Impedance Value: 67 Ohm
Lead Channel Setting Pacing Amplitude: 2.5 V
Lead Channel Setting Pacing Pulse Width: 0.4 ms
Lead Channel Setting Sensing Sensitivity: 4 mV
Zone Setting Status: 755011
Zone Setting Status: 755011

## 2022-10-30 LAB — BASIC METABOLIC PANEL
Anion gap: 9 (ref 5–15)
BUN: 34 mg/dL — ABNORMAL HIGH (ref 8–23)
CO2: 26 mmol/L (ref 22–32)
Calcium: 8.7 mg/dL — ABNORMAL LOW (ref 8.9–10.3)
Chloride: 103 mmol/L (ref 98–111)
Creatinine, Ser: 2.04 mg/dL — ABNORMAL HIGH (ref 0.44–1.00)
GFR, Estimated: 25 mL/min — ABNORMAL LOW (ref >60)
Glucose, Bld: 115 mg/dL — ABNORMAL HIGH (ref 70–99)
Potassium: 4.1 mmol/L (ref 3.5–5.1)
Sodium: 138 mmol/L (ref 135–145)

## 2022-10-30 LAB — CBC
HCT: 37.4 % (ref 36.0–46.0)
Hemoglobin: 11.9 g/dL — ABNORMAL LOW (ref 12.0–15.0)
MCH: 31.5 pg (ref 26.0–34.0)
MCHC: 31.8 g/dL (ref 30.0–36.0)
MCV: 98.9 fL (ref 80.0–100.0)
Platelets: 226 10*3/uL (ref 150–400)
RBC: 3.78 MIL/uL — ABNORMAL LOW (ref 3.87–5.11)
RDW: 18.9 % — ABNORMAL HIGH (ref 11.5–15.5)
WBC: 8.3 10*3/uL (ref 4.0–10.5)
nRBC: 0 % (ref 0.0–0.2)

## 2022-10-30 NOTE — Patient Instructions (Addendum)
Medication Instructions:   **LAST DOSE OF PLAVIX ON 10/13**  *If you need a refill on your cardiac medications before your next appointment, please call your pharmacy*   Lab Work: CBC BMET  If you have labs (blood work) drawn today and your tests are completely normal, you will receive your results only by: MyChart Message (if you have MyChart) OR A paper copy in the mail If you have any lab test that is abnormal or we need to change your treatment, we will call you to review the results.   Testing/Procedures: Gen Change   Follow-Up: At [facility], you and your health needs are our priority.  As part of our continuing mission to provide you with exceptional heart care, we have created designated Provider Care Teams.  These Care Teams include your primary Cardiologist (physician) and Advanced Practice Providers (APPs -  Physician Assistants and Nurse Practitioners) who all work together to provide you with the care you need, when you need it.  We recommend signing up for the patient portal called "MyChart".  Sign up information is provided on this After Visit Summary.  MyChart is used to connect with patients for Virtual Visits (Telemedicine).  Patients are able to view lab/test results, encounter notes, upcoming appointments, etc.  Non-urgent messages can be sent to your provider as well.   To learn more about what you can do with MyChart, go to [email or web address].    Your next appointment:   3 month(s)  Provider:   Lewayne Bunting, MD    Other Instructions

## 2022-10-30 NOTE — H&P (View-Only) (Signed)
HPI [name] returns for followup of sinus node dysfunction s/p PPM insertion. She is a pleasant 72 yo woman with a h/o peripheral vascular disease, HTN, sinus node dysfunction, s/p PPM insertion. She has reached ERI on her PPM and reverted to VVI 65. No edema, chest pain or syncope. She is now on both plavix and ASA. She notes that she did not take her bp meds this morning.  Allergies  Allergen Reactions   Amlodipine Swelling    Started very close to episode of angioedema to ACE, unknown if this added effects or just residual   Lisinopril     angioedema   Losartan Swelling    Lip swelling    Codeine Phosphate Other (See Comments)    REACTION: UNKNOWN   Morphine And Codeine Other (See Comments)    Cannot take:Heart problems   Naproxen Nausea And Vomiting   Nsaids Nausea Only   Penicillins Other (See Comments)    UNKNOWN REACTION   Propoxyphene N-Acetaminophen Other (See Comments)    Makes me gag   Sulfamethoxazole-Trimethoprim Other (See Comments)    Gi upset   Flexeril [Cyclobenzaprine] Anxiety and Other (See Comments)    Pt states medication gives her nightmares and insomnia.     Current Outpatient Medications  Medication Sig Dispense Refill   aspirin 81 MG tablet Take 81 mg by mouth every morning.      carvedilol (COREG) 25 MG tablet Take 1 tablet (25 mg total) by mouth 2 (two) times daily with a meal. 180 tablet 3   cholecalciferol (VITAMIN D) 1000 UNITS tablet Take 1,000 Units by mouth every morning.      cloNIDine (CATAPRES - DOSED IN MG/24 HR) 0.3 mg/24hr patch Place 1 patch (0.3 mg total) onto the skin once a week. 4 patch 12   clopidogrel (PLAVIX) 75 MG tablet Take 75 mg by mouth daily.     clotrimazole-betamethasone (LOTRISONE) cream Apply topically.     doxazosin (CARDURA) 1 MG tablet TAKE 1 TABLET BY MOUTH DAILY 100 tablet 2   empagliflozin (JARDIANCE) 10 MG TABS tablet Take 1 tablet (10 mg total) by mouth daily before breakfast. 30 tablet 3   famotidine  (PEPCID) 20 MG tablet Take 20 mg by mouth daily.     gabapentin (NEURONTIN) 100 MG capsule Take 1 capsule (100 mg total) by mouth 3 (three) times daily. 90 capsule 2   glimepiride (AMARYL) 1 MG tablet Take 1 tablet (1 mg total) by mouth every morning. 30 tablet 3   hydrALAZINE (APRESOLINE) 100 MG tablet Take 1 tablet (100 mg total) by mouth 3 (three) times daily. 90 tablet 0   hydrochlorothiazide (HYDRODIURIL) 25 MG tablet Take 1 tablet (25 mg total) by mouth daily. 30 tablet 1   hydrOXYzine (ATARAX) 50 MG tablet Take 1 tablet (50 mg total) by mouth every 8 (eight) hours as needed. 30 tablet 0   isosorbide mononitrate (IMDUR) 60 MG 24 hr tablet TAKE 1 TABLET BY MOUTH DAILY 90 tablet 3   levothyroxine (SYNTHROID) 150 MCG tablet Take 150 mcg by mouth daily.     levothyroxine (SYNTHROID) 25 MCG tablet Take 1 tablet (25 mcg total) by mouth daily. 30 tablet 2   lovastatin (MEVACOR) 20 MG tablet Take 20 mg by mouth at bedtime.     Multiple Vitamin (MULTIVITAMIN) tablet Take 1 tablet by mouth every morning.      Oxycodone HCl 10 MG TABS Take 10 mg by mouth 4 (four) times daily as needed (  pain).     OXYGEN Inhale 2 application  into the lungs at bedtime.     pantoprazole (PROTONIX) 40 MG tablet Take 1 tablet (40 mg total) by mouth daily. 30 tablet 1   polyethylene glycol (MIRALAX / GLYCOLAX) packet Take 17 g by mouth daily.     sertraline (ZOLOFT) 100 MG tablet Take 100 mg by mouth at bedtime.     STIOLTO RESPIMAT 2.5-2.5 MCG/ACT AERS INHALE 2 PUFFS INTO THE LUNGS ONCE DAILY 4 g 5   triamcinolone cream (KENALOG) 0.1 % Apply topically 2 (two) times daily as needed (eczema).     No current facility-administered medications for this visit.     Past Medical History:  Diagnosis Date   Anemia    Anxiety    Arthritis    Atrial fib/flutter, transient (HCC)    Cataract    COPD (chronic obstructive pulmonary disease) (HCC)    Depression    Diabetes mellitus    Diverticulosis    DJD (degenerative  joint disease)    GERD (gastroesophageal reflux disease)    Gout    History of pneumonia    Hypertension    Hypothyroidism    Internal hemorrhoids     ROS:   All systems reviewed and negative except as noted in the HPI.   Past Surgical History:  Procedure Laterality Date   ABDOMINAL AORTOGRAM W/LOWER EXTREMITY N/A 07/24/2020   Procedure: ABDOMINAL AORTOGRAM W/LOWER EXTREMITY;  Surgeon: Nada Libman, MD;  Location: [facility];  Service: Cardiovascular;  Laterality: N/A;   CAROTID STENT  05/2021   CATARACT EXTRACTION W/ INTRAOCULAR LENS IMPLANT Left    ENDARTERECTOMY Left 09/09/2019   Procedure: LEFT ILIAC ENDARTERECTOMY;  Surgeon: Nada Libman, MD;  Location: [facility];  Service: Vascular;  Laterality: Left;   HERNIA REPAIR     INSERTION OF ILIAC STENT Left 09/09/2019   Procedure: INSERTION OF LEFT ILIAC STENT;  Surgeon: Nada Libman, MD;  Location: [facility];  Service: Vascular;  Laterality: Left;   PACEMAKER INSERTION     PERIPHERAL VASCULAR INTERVENTION Left 07/24/2020   Procedure: PERIPHERAL VASCULAR INTERVENTION;  Surgeon: Nada Libman, MD;  Location: [facility];  Service: Cardiovascular;  Laterality: Left;   THORACIC AORTIC ENDOVASCULAR STENT GRAFT N/A 09/09/2019   Procedure: THORACIC AORTIC ENDOVASCULAR STENT GRAFT WITH LEFT ILIAC CONDUIT;  Surgeon: Nada Libman, MD;  Location: [facility];  Service: Vascular;  Laterality: N/A;   TUBAL LIGATION     ULTRASOUND GUIDANCE FOR VASCULAR ACCESS Left 09/09/2019   Procedure: ULTRASOUND GUIDANCE FOR VASCULAR ACCESS;  Surgeon: Nada Libman, MD;  Location: [facility];  Service: Vascular;  Laterality: Left;   VAGINAL HYSTERECTOMY       Family History  Problem Relation Age of Onset   Prostate cancer Father    Hypertension Mother    Heart attack Mother    Heart disease Mother    Kidney disease Mother 26   Stomach cancer Son    Diabetes Maternal Aunt    CAD Maternal Aunt    Stroke Maternal Aunt    Heart  disease Maternal Uncle    CAD Maternal Uncle    Stroke Maternal Uncle    Colon cancer Neg Hx    Esophageal cancer Neg Hx    Rectal cancer Neg Hx      Social History   Socioeconomic History   Marital status: Widowed    Spouse name: Not on file   Number of children: 2  Years of education: Not on file   Highest education level: Not on file  Occupational History   Occupation: Disabled    Employer: UNEMPLOYED  Tobacco Use   Smoking status: Every Day    Current packs/day: 2.00    Average packs/day: 2.0 packs/day for 54.0 years (108.0 ttl pk-yrs)    Types: Cigarettes    Passive exposure: Never   Smokeless tobacco: Never   Tobacco comments:    3 cigarettes daily.  Trying to quit.  07/08/2022 hfb  Vaping Use   Vaping status: Never Used  Substance and Sexual Activity   Alcohol use: No   Drug use: No   Sexual activity: Yes    Birth control/protection: Surgical  Other Topics Concern   Not on file  Social History Narrative   Right handed    Has a son   Hostess at hotel    Social Determinants of Health   Financial Resource Strain: Low Risk  (06/24/2022)   Overall Financial Resource Strain (CARDIA)    Difficulty of Paying Living Expenses: Not very hard  Food Insecurity: No Food Insecurity (06/24/2022)   Hunger Vital Sign    Worried About Running Out of Food in the Last Year: Never true    Ran Out of Food in the Last Year: Never true  Transportation Needs: No Transportation Needs (06/24/2022)   PRAPARE - Administrator, Civil Service (Medical): No    Lack of Transportation (Non-Medical): No  Physical Activity: Inactive (10/09/2022)   Exercise Vital Sign    Days of Exercise per Week: 0 days    Minutes of Exercise per Session: 0 min  Stress: Stress Concern Present (11/28/2019)   [employer] of Occupational Health - Occupational Stress Questionnaire    Feeling of Stress : Rather much  Social Connections: Not on file  Intimate Partner Violence: Not At Risk  (06/24/2022)   Humiliation, Afraid, Rape, and Kick questionnaire    Fear of Current or Ex-Partner: No    Emotionally Abused: No    Physically Abused: No    Sexually Abused: No     BP (!) 150/84   Pulse 74   Ht 5\' 5"  (1.651 m)   Wt 191 lb 12.8 oz (87 kg)   SpO2 95%   BMI 31.92 kg/m   Physical Exam:  Well appearing NAD HEENT: Unremarkable Neck:  No JVD, no thyromegally Lymphatics:  No adenopathy Back:  No CVA tenderness Lungs:  Clear with no wheezes HEART:  Regular rate rhythm, no murmurs, no rubs, no clicks Abd:  soft, positive bowel sounds, no organomegally, no rebound, no guarding Ext:  2 plus pulses, no edema, no cyanosis, no clubbing Skin:  No rashes no nodules Neuro:  CN II through XII intact, motor grossly intact  DEVICE  Normal device function.  See PaceArt for details.   Assess/Plan:  sinus node dysfunction - she is asymptomatic s/p PPM insertion. HTN - she notes that at home her bp is much better controlled. I asked her to avoid salty foods. She has had some  sciatic pain as well. PAF - she has had less than 5 minutes of atrial fib at time. I suspect that she will have more and end up needing an OAC.  PPM - her Medtronic DDD PM is working normally but she has reached ERI. We will schedule PPM gen change.   Sharlot Gowda Kc Summerson,MD

## 2022-10-30 NOTE — Progress Notes (Signed)
HPI Ms. Krul returns for followup of sinus node dysfunction s/p PPM insertion. She is a pleasant 72 yo woman with a h/o peripheral vascular disease, HTN, sinus node dysfunction, s/p PPM insertion. She has reached ERI on her PPM and reverted to VVI 65. No edema, chest pain or syncope. She is now on both plavix and ASA. She notes that she did not take her bp meds this morning.  Allergies  Allergen Reactions   Amlodipine Swelling    Started very close to episode of angioedema to ACE, unknown if this added effects or just residual   Lisinopril     angioedema   Losartan Swelling    Lip swelling    Codeine Phosphate Other (See Comments)    REACTION: UNKNOWN   Morphine And Codeine Other (See Comments)    Cannot take:Heart problems   Naproxen Nausea And Vomiting   Nsaids Nausea Only   Penicillins Other (See Comments)    UNKNOWN REACTION   Propoxyphene N-Acetaminophen Other (See Comments)    Makes me gag   Sulfamethoxazole-Trimethoprim Other (See Comments)    Gi upset   Flexeril [Cyclobenzaprine] Anxiety and Other (See Comments)    Pt states medication gives her nightmares and insomnia.     Current Outpatient Medications  Medication Sig Dispense Refill   aspirin 81 MG tablet Take 81 mg by mouth every morning.      carvedilol (COREG) 25 MG tablet Take 1 tablet (25 mg total) by mouth 2 (two) times daily with a meal. 180 tablet 3   cholecalciferol (VITAMIN D) 1000 UNITS tablet Take 1,000 Units by mouth every morning.      cloNIDine (CATAPRES - DOSED IN MG/24 HR) 0.3 mg/24hr patch Place 1 patch (0.3 mg total) onto the skin once a week. 4 patch 12   clopidogrel (PLAVIX) 75 MG tablet Take 75 mg by mouth daily.     clotrimazole-betamethasone (LOTRISONE) cream Apply topically.     doxazosin (CARDURA) 1 MG tablet TAKE 1 TABLET BY MOUTH DAILY 100 tablet 2   empagliflozin (JARDIANCE) 10 MG TABS tablet Take 1 tablet (10 mg total) by mouth daily before breakfast. 30 tablet 3   famotidine  (PEPCID) 20 MG tablet Take 20 mg by mouth daily.     gabapentin (NEURONTIN) 100 MG capsule Take 1 capsule (100 mg total) by mouth 3 (three) times daily. 90 capsule 2   glimepiride (AMARYL) 1 MG tablet Take 1 tablet (1 mg total) by mouth every morning. 30 tablet 3   hydrALAZINE (APRESOLINE) 100 MG tablet Take 1 tablet (100 mg total) by mouth 3 (three) times daily. 90 tablet 0   hydrochlorothiazide (HYDRODIURIL) 25 MG tablet Take 1 tablet (25 mg total) by mouth daily. 30 tablet 1   hydrOXYzine (ATARAX) 50 MG tablet Take 1 tablet (50 mg total) by mouth every 8 (eight) hours as needed. 30 tablet 0   isosorbide mononitrate (IMDUR) 60 MG 24 hr tablet TAKE 1 TABLET BY MOUTH DAILY 90 tablet 3   levothyroxine (SYNTHROID) 150 MCG tablet Take 150 mcg by mouth daily.     levothyroxine (SYNTHROID) 25 MCG tablet Take 1 tablet (25 mcg total) by mouth daily. 30 tablet 2   lovastatin (MEVACOR) 20 MG tablet Take 20 mg by mouth at bedtime.     Multiple Vitamin (MULTIVITAMIN) tablet Take 1 tablet by mouth every morning.      Oxycodone HCl 10 MG TABS Take 10 mg by mouth 4 (four) times daily as needed (  pain).     OXYGEN Inhale 2 application  into the lungs at bedtime.     pantoprazole (PROTONIX) 40 MG tablet Take 1 tablet (40 mg total) by mouth daily. 30 tablet 1   polyethylene glycol (MIRALAX / GLYCOLAX) packet Take 17 g by mouth daily.     sertraline (ZOLOFT) 100 MG tablet Take 100 mg by mouth at bedtime.     STIOLTO RESPIMAT 2.5-2.5 MCG/ACT AERS INHALE 2 PUFFS INTO THE LUNGS ONCE DAILY 4 g 5   triamcinolone cream (KENALOG) 0.1 % Apply topically 2 (two) times daily as needed (eczema).     No current facility-administered medications for this visit.     Past Medical History:  Diagnosis Date   Anemia    Anxiety    Arthritis    Atrial fib/flutter, transient (HCC)    Cataract    COPD (chronic obstructive pulmonary disease) (HCC)    Depression    Diabetes mellitus    Diverticulosis    DJD (degenerative  joint disease)    GERD (gastroesophageal reflux disease)    Gout    History of pneumonia    Hypertension    Hypothyroidism    Internal hemorrhoids     ROS:   All systems reviewed and negative except as noted in the HPI.   Past Surgical History:  Procedure Laterality Date   ABDOMINAL AORTOGRAM W/LOWER EXTREMITY N/A 07/24/2020   Procedure: ABDOMINAL AORTOGRAM W/LOWER EXTREMITY;  Surgeon: Nada Libman, MD;  Location: [facility];  Service: Cardiovascular;  Laterality: N/A;   CAROTID STENT  05/2021   CATARACT EXTRACTION W/ INTRAOCULAR LENS IMPLANT Left    ENDARTERECTOMY Left 09/09/2019   Procedure: LEFT ILIAC ENDARTERECTOMY;  Surgeon: Nada Libman, MD;  Location: [facility];  Service: Vascular;  Laterality: Left;   HERNIA REPAIR     INSERTION OF ILIAC STENT Left 09/09/2019   Procedure: INSERTION OF LEFT ILIAC STENT;  Surgeon: Nada Libman, MD;  Location: [facility];  Service: Vascular;  Laterality: Left;   PACEMAKER INSERTION     PERIPHERAL VASCULAR INTERVENTION Left 07/24/2020   Procedure: PERIPHERAL VASCULAR INTERVENTION;  Surgeon: Nada Libman, MD;  Location: [facility];  Service: Cardiovascular;  Laterality: Left;   THORACIC AORTIC ENDOVASCULAR STENT GRAFT N/A 09/09/2019   Procedure: THORACIC AORTIC ENDOVASCULAR STENT GRAFT WITH LEFT ILIAC CONDUIT;  Surgeon: Nada Libman, MD;  Location: [facility];  Service: Vascular;  Laterality: N/A;   TUBAL LIGATION     ULTRASOUND GUIDANCE FOR VASCULAR ACCESS Left 09/09/2019   Procedure: ULTRASOUND GUIDANCE FOR VASCULAR ACCESS;  Surgeon: Nada Libman, MD;  Location: [facility];  Service: Vascular;  Laterality: Left;   VAGINAL HYSTERECTOMY       Family History  Problem Relation Age of Onset   Prostate cancer Father    Hypertension Mother    Heart attack Mother    Heart disease Mother    Kidney disease Mother 55   Stomach cancer Son    Diabetes Maternal Aunt    CAD Maternal Aunt    Stroke Maternal Aunt    Heart  disease Maternal Uncle    CAD Maternal Uncle    Stroke Maternal Uncle    Colon cancer Neg Hx    Esophageal cancer Neg Hx    Rectal cancer Neg Hx      Social History   Socioeconomic History   Marital status: Widowed    Spouse name: Not on file   Number of children: 2  Years of education: Not on file   Highest education level: Not on file  Occupational History   Occupation: Disabled    Employer: UNEMPLOYED  Tobacco Use   Smoking status: Every Day    Current packs/day: 2.00    Average packs/day: 2.0 packs/day for 54.0 years (108.0 ttl pk-yrs)    Types: Cigarettes    Passive exposure: Never   Smokeless tobacco: Never   Tobacco comments:    3 cigarettes daily.  Trying to quit.  07/08/2022 hfb  Vaping Use   Vaping status: Never Used  Substance and Sexual Activity   Alcohol use: No   Drug use: No   Sexual activity: Yes    Birth control/protection: Surgical  Other Topics Concern   Not on file  Social History Narrative   Right handed    Has a son   Hostess at hotel    Social Determinants of Health   Financial Resource Strain: Low Risk  (06/24/2022)   Overall Financial Resource Strain (CARDIA)    Difficulty of Paying Living Expenses: Not very hard  Food Insecurity: No Food Insecurity (06/24/2022)   Hunger Vital Sign    Worried About Running Out of Food in the Last Year: Never true    Ran Out of Food in the Last Year: Never true  Transportation Needs: No Transportation Needs (06/24/2022)   PRAPARE - Administrator, Civil Service (Medical): No    Lack of Transportation (Non-Medical): No  Physical Activity: Inactive (10/09/2022)   Exercise Vital Sign    Days of Exercise per Week: 0 days    Minutes of Exercise per Session: 0 min  Stress: Stress Concern Present (11/28/2019)   [employer] of Occupational Health - Occupational Stress Questionnaire    Feeling of Stress : Rather much  Social Connections: Not on file  Intimate Partner Violence: Not At Risk  (06/24/2022)   Humiliation, Afraid, Rape, and Kick questionnaire    Fear of Current or Ex-Partner: No    Emotionally Abused: No    Physically Abused: No    Sexually Abused: No     BP (!) 150/84   Pulse 74   Ht 5\' 5"  (1.651 m)   Wt 191 lb 12.8 oz (87 kg)   SpO2 95%   BMI 31.92 kg/m   Physical Exam:  Well appearing NAD HEENT: Unremarkable Neck:  No JVD, no thyromegally Lymphatics:  No adenopathy Back:  No CVA tenderness Lungs:  Clear with no wheezes HEART:  Regular rate rhythm, no murmurs, no rubs, no clicks Abd:  soft, positive bowel sounds, no organomegally, no rebound, no guarding Ext:  2 plus pulses, no edema, no cyanosis, no clubbing Skin:  No rashes no nodules Neuro:  CN II through XII intact, motor grossly intact  DEVICE  Normal device function.  See PaceArt for details.   Assess/Plan:  sinus node dysfunction - she is asymptomatic s/p PPM insertion. HTN - she notes that at home her bp is much better controlled. I asked her to avoid salty foods. She has had some  sciatic pain as well. PAF - she has had less than 5 minutes of atrial fib at time. I suspect that she will have more and end up needing an OAC.  PPM - her Medtronic DDD PM is working normally but she has reached ERI. We will schedule PPM gen change.   Sharlot Gowda Aidyn Sportsman,MD

## 2022-11-05 ENCOUNTER — Encounter (HOSPITAL_COMMUNITY): Payer: 59 | Admitting: Cardiology

## 2022-11-05 NOTE — Pre-Procedure Instructions (Signed)
Instructed patient on the following items: Arrival time 0730 Nothing to eat or drink after midnight No meds AM of procedure Responsible person to drive you home and stay with you for 24 hrs Wash with special soap night before and morning of procedure If on anti-coagulant drug instructions Plavix-last dose 10/13

## 2022-11-06 ENCOUNTER — Encounter (HOSPITAL_COMMUNITY)
Admission: RE | Disposition: A | Discharge status: Home / Self Care | Payer: Self-pay | Source: Home / Self Care | Attending: Internal Medicine

## 2022-11-06 ENCOUNTER — Other Ambulatory Visit: Payer: Self-pay

## 2022-11-06 ENCOUNTER — Ambulatory Visit (HOSPITAL_COMMUNITY)
Admission: RE | Admit: 2022-11-06 | Discharge: 2022-11-06 | Disposition: A | Discharge status: Home / Self Care | Payer: 59 | Attending: Internal Medicine | Admitting: Internal Medicine

## 2022-11-06 DIAGNOSIS — Z7984 Long term (current) use of oral hypoglycemic drugs: Secondary | ICD-10-CM | POA: Insufficient documentation

## 2022-11-06 DIAGNOSIS — Z7982 Long term (current) use of aspirin: Secondary | ICD-10-CM | POA: Diagnosis not present

## 2022-11-06 DIAGNOSIS — Z45010 Encounter for checking and testing of cardiac pacemaker pulse generator [battery]: Secondary | ICD-10-CM | POA: Diagnosis present

## 2022-11-06 DIAGNOSIS — Z7902 Long term (current) use of antithrombotics/antiplatelets: Secondary | ICD-10-CM | POA: Diagnosis not present

## 2022-11-06 DIAGNOSIS — F17210 Nicotine dependence, cigarettes, uncomplicated: Secondary | ICD-10-CM | POA: Insufficient documentation

## 2022-11-06 DIAGNOSIS — I10 Essential (primary) hypertension: Secondary | ICD-10-CM | POA: Diagnosis not present

## 2022-11-06 DIAGNOSIS — E1151 Type 2 diabetes mellitus with diabetic peripheral angiopathy without gangrene: Secondary | ICD-10-CM | POA: Diagnosis not present

## 2022-11-06 DIAGNOSIS — I495 Sick sinus syndrome: Secondary | ICD-10-CM | POA: Insufficient documentation

## 2022-11-06 DIAGNOSIS — I480 Paroxysmal atrial fibrillation: Secondary | ICD-10-CM | POA: Diagnosis not present

## 2022-11-06 HISTORY — PX: PPM GENERATOR CHANGEOUT: EP1233

## 2022-11-06 LAB — GLUCOSE, CAPILLARY: Glucose-Capillary: 108 mg/dL — ABNORMAL HIGH (ref 70–99)

## 2022-11-06 SURGERY — PPM GENERATOR CHANGEOUT

## 2022-11-06 MED ORDER — ONDANSETRON HCL 4 MG/2ML IJ SOLN: 4.0000 mg | Freq: Four times a day (QID) | INTRAMUSCULAR | Status: DC | PRN

## 2022-11-06 MED ORDER — SODIUM CHLORIDE 0.9 % IV SOLN
80.0000 mg | INTRAVENOUS | Status: AC
  Administered 2022-11-06: 80 mg

## 2022-11-06 MED ORDER — VANCOMYCIN HCL IN DEXTROSE 1-5 GM/200ML-% IV SOLN
1000.0000 mg | INTRAVENOUS | Status: AC
  Administered 2022-11-06: 1000 mg via INTRAVENOUS

## 2022-11-06 MED ORDER — POVIDONE-IODINE 10 % EX SWAB
2.0000 | Freq: Once | CUTANEOUS | Status: AC
  Administered 2022-11-06: 2 via TOPICAL

## 2022-11-06 MED ORDER — LIDOCAINE HCL (PF) 1 % IJ SOLN
INTRAMUSCULAR | Status: DC | PRN
  Administered 2022-11-06: 60 mL

## 2022-11-06 MED ORDER — HYDRALAZINE HCL 20 MG/ML IJ SOLN
INTRAMUSCULAR | Status: DC | PRN
  Administered 2022-11-06: 10 mg via INTRAVENOUS

## 2022-11-06 MED ORDER — CEFAZOLIN SODIUM-DEXTROSE 2-4 GM/100ML-% IV SOLN: 2.0000 g | INTRAVENOUS | Status: DC

## 2022-11-06 MED ORDER — METOPROLOL TARTRATE 5 MG/5ML IV SOLN
INTRAVENOUS | Status: DC | PRN
  Administered 2022-11-06: 5 mg via INTRAVENOUS

## 2022-11-06 MED ORDER — METOPROLOL TARTRATE 5 MG/5ML IV SOLN
INTRAVENOUS | Status: AC
  Filled 2022-11-06: qty 5

## 2022-11-06 MED ORDER — LIDOCAINE HCL 1 % IJ SOLN
INTRAMUSCULAR | Status: AC
  Filled 2022-11-06: qty 20

## 2022-11-06 MED ORDER — ACETAMINOPHEN 325 MG PO TABS: 325.0000 mg | ORAL_TABLET | ORAL | Status: DC | PRN

## 2022-11-06 MED ORDER — SODIUM CHLORIDE 0.9 % IV SOLN
INTRAVENOUS | Status: AC
  Filled 2022-11-06: qty 2

## 2022-11-06 MED ORDER — CHLORHEXIDINE GLUCONATE 4 % EX SOLN: 4.0000 | Freq: Once | CUTANEOUS | Status: DC

## 2022-11-06 MED ORDER — VANCOMYCIN HCL IN DEXTROSE 1-5 GM/200ML-% IV SOLN
INTRAVENOUS | Status: AC
  Filled 2022-11-06: qty 200

## 2022-11-06 MED ORDER — LIDOCAINE HCL 1 % IJ SOLN
INTRAMUSCULAR | Status: AC
  Filled 2022-11-06: qty 60

## 2022-11-06 MED ORDER — HYDRALAZINE HCL 20 MG/ML IJ SOLN
INTRAMUSCULAR | Status: AC
  Filled 2022-11-06: qty 1

## 2022-11-06 MED ORDER — SODIUM CHLORIDE 0.9 % IV SOLN: INTRAVENOUS | Status: DC

## 2022-11-06 SURGICAL SUPPLY — 7 items
CABLE SURGICAL S-101-97-12 (CABLE) ×1 IMPLANT
IPG PACE AZUR XT DR MRI W1DR01 (Pacemaker) IMPLANT
PACE AZURE XT DR MRI W1DR01 (Pacemaker) ×1 IMPLANT
PAD DEFIB RADIO PHYSIO CONN (PAD) ×1 IMPLANT
POUCH AIGIS-R ANTIBACT PPM (Mesh General) ×1 IMPLANT
POUCH AIGIS-R ANTIBACT PPM MED (Mesh General) IMPLANT
TRAY PACEMAKER INSERTION (PACKS) ×1 IMPLANT

## 2022-11-06 NOTE — Discharge Instructions (Signed)

## 2022-11-06 NOTE — Interval H&P Note (Signed)
History and Physical Interval Note:  11/06/2022 11:12 AM  [name]  has presented today for surgery, with the diagnosis of sinus node dysfunction.  The various methods of treatment have been discussed with the patient and family. After consideration of risks, benefits and other options for treatment, the patient has consented to  Procedure(s): PPM GENERATOR CHANGEOUT (N/A) as a surgical intervention.  The patient's history has been reviewed, patient examined, no change in status, stable for surgery.  I have reviewed the patient's chart and labs.  Questions were answered to the patient's satisfaction.     Lewayne Bunting

## 2022-11-07 ENCOUNTER — Encounter (HOSPITAL_COMMUNITY): Payer: Self-pay | Admitting: Internal Medicine

## 2022-11-10 ENCOUNTER — Ambulatory Visit: Payer: 59 | Admitting: Pulmonary Disease

## 2022-11-10 ENCOUNTER — Encounter: Payer: Self-pay | Admitting: Pulmonary Disease

## 2022-11-10 NOTE — Plan of Care (Signed)
[facility] Tonsillectomy/Adenoidectomy, Postoperative PEDS care plan entered in error.

## 2022-11-14 ENCOUNTER — Telehealth: Payer: Self-pay

## 2022-11-14 NOTE — Telephone Encounter (Signed)
Returned call to Pt.  She had restarted her Plavix today.  Advised that was right.  She has removed outer dressing.  Steristrips intact.  Advised of upcoming wound appointment.  Advised ok to drive to wound appointment, but not before.  All questions answered.

## 2022-11-14 NOTE — Telephone Encounter (Signed)
Pt has questions about when she needs to start taking her blood thinners and traveling again. Pt only wants to talk to a nurse about it

## 2022-11-20 ENCOUNTER — Ambulatory Visit: Payer: 59 | Attending: Internal Medicine

## 2022-11-20 DIAGNOSIS — I509 Heart failure, unspecified: Secondary | ICD-10-CM

## 2022-11-20 LAB — CUP PACEART INCLINIC DEVICE CHECK
Battery Remaining Longevity: 159 mo
Battery Voltage: 3.23 V
Brady Statistic AP VP Percent: 0.04 %
Brady Statistic AP VS Percent: 60.91 %
Brady Statistic AS VP Percent: 0.09 %
Brady Statistic AS VS Percent: 38.96 %
Brady Statistic RA Percent Paced: 60.94 %
Brady Statistic RV Percent Paced: 0.14 %
Date Time Interrogation Session: 20241031133744
Implantable Lead Connection Status: 753985
Implantable Lead Connection Status: 753985
Implantable Lead Implant Date: 19970929
Implantable Lead Implant Date: 19970929
Implantable Lead Location: 753859
Implantable Lead Location: 753860
Implantable Lead Model: 4068
Implantable Lead Model: 5092
Implantable Pulse Generator Implant Date: 20241017
Lead Channel Impedance Value: 304 Ohm
Lead Channel Impedance Value: 323 Ohm
Lead Channel Impedance Value: 342 Ohm
Lead Channel Impedance Value: 418 Ohm
Lead Channel Pacing Threshold Amplitude: 0.875 V
Lead Channel Pacing Threshold Amplitude: 1.25 V
Lead Channel Pacing Threshold Pulse Width: 0.4 ms
Lead Channel Pacing Threshold Pulse Width: 0.4 ms
Lead Channel Sensing Intrinsic Amplitude: 0.5 mV
Lead Channel Sensing Intrinsic Amplitude: 0.875 mV
Lead Channel Sensing Intrinsic Amplitude: 11.875 mV
Lead Channel Sensing Intrinsic Amplitude: 14.125 mV
Lead Channel Setting Pacing Amplitude: 2 V
Lead Channel Setting Pacing Amplitude: 2 V
Lead Channel Setting Pacing Pulse Width: 0.4 ms
Lead Channel Setting Sensing Sensitivity: 1.2 mV
Zone Setting Status: 755011

## 2022-11-20 NOTE — Patient Instructions (Signed)

## 2022-11-20 NOTE — Progress Notes (Signed)
Wound check appointment. Steri-strips removed. Wound without redness or edema. Incision edges approximated, wound well healed. Normal device function. Thresholds, sensing, and impedances consistent with implant measurements. Device programmed at chronic outputs post gen change. Histogram distribution appropriate for patient and level of activity. AT/AF <0.1 appears to be noise on atrial lead. Brief oversensing noted on atrial lead reviewed with GT see programming changes below. Patient educated on no arm mobility or lifting restrictions. ROV in 3 months with implanting physician.  Programming changes:  A. Sensitivity Changed from 0.30 mV to 0.42mV

## 2022-12-01 ENCOUNTER — Encounter (HOSPITAL_COMMUNITY): Payer: 59 | Admitting: Cardiology

## 2022-12-03 ENCOUNTER — Inpatient Hospital Stay (HOSPITAL_COMMUNITY)
Admission: RE | Admit: 2022-12-03 | Discharge: 2022-12-03 | Discharge status: Home / Self Care | Payer: 59 | Source: Ambulatory Visit | Attending: Cardiology | Admitting: Cardiology

## 2022-12-03 ENCOUNTER — Encounter (HOSPITAL_COMMUNITY): Payer: Self-pay | Admitting: Cardiology

## 2022-12-03 VITALS — BP 158/98 | HR 74 | Wt 195.4 lb

## 2022-12-03 DIAGNOSIS — I480 Paroxysmal atrial fibrillation: Secondary | ICD-10-CM | POA: Diagnosis not present

## 2022-12-03 DIAGNOSIS — Z79899 Other long term (current) drug therapy: Secondary | ICD-10-CM | POA: Diagnosis not present

## 2022-12-03 DIAGNOSIS — Z7982 Long term (current) use of aspirin: Secondary | ICD-10-CM | POA: Insufficient documentation

## 2022-12-03 DIAGNOSIS — N1832 Chronic kidney disease, stage 3b: Secondary | ICD-10-CM | POA: Diagnosis not present

## 2022-12-03 DIAGNOSIS — I509 Heart failure, unspecified: Secondary | ICD-10-CM | POA: Diagnosis not present

## 2022-12-03 DIAGNOSIS — Z950 Presence of cardiac pacemaker: Secondary | ICD-10-CM | POA: Diagnosis not present

## 2022-12-03 DIAGNOSIS — J449 Chronic obstructive pulmonary disease, unspecified: Secondary | ICD-10-CM | POA: Diagnosis not present

## 2022-12-03 DIAGNOSIS — Z9981 Dependence on supplemental oxygen: Secondary | ICD-10-CM | POA: Insufficient documentation

## 2022-12-03 DIAGNOSIS — I130 Hypertensive heart and chronic kidney disease with heart failure and stage 1 through stage 4 chronic kidney disease, or unspecified chronic kidney disease: Secondary | ICD-10-CM | POA: Insufficient documentation

## 2022-12-03 DIAGNOSIS — I495 Sick sinus syndrome: Secondary | ICD-10-CM | POA: Diagnosis not present

## 2022-12-03 DIAGNOSIS — I739 Peripheral vascular disease, unspecified: Secondary | ICD-10-CM

## 2022-12-03 DIAGNOSIS — E1136 Type 2 diabetes mellitus with diabetic cataract: Secondary | ICD-10-CM | POA: Insufficient documentation

## 2022-12-03 DIAGNOSIS — I4891 Unspecified atrial fibrillation: Secondary | ICD-10-CM | POA: Insufficient documentation

## 2022-12-03 DIAGNOSIS — J9610 Chronic respiratory failure, unspecified whether with hypoxia or hypercapnia: Secondary | ICD-10-CM | POA: Insufficient documentation

## 2022-12-03 DIAGNOSIS — J9611 Chronic respiratory failure with hypoxia: Secondary | ICD-10-CM

## 2022-12-03 DIAGNOSIS — I701 Atherosclerosis of renal artery: Secondary | ICD-10-CM | POA: Insufficient documentation

## 2022-12-03 DIAGNOSIS — Z7902 Long term (current) use of antithrombotics/antiplatelets: Secondary | ICD-10-CM | POA: Diagnosis not present

## 2022-12-03 DIAGNOSIS — N184 Chronic kidney disease, stage 4 (severe): Secondary | ICD-10-CM

## 2022-12-03 DIAGNOSIS — E1122 Type 2 diabetes mellitus with diabetic chronic kidney disease: Secondary | ICD-10-CM | POA: Diagnosis not present

## 2022-12-03 DIAGNOSIS — F17210 Nicotine dependence, cigarettes, uncomplicated: Secondary | ICD-10-CM | POA: Insufficient documentation

## 2022-12-03 DIAGNOSIS — I272 Pulmonary hypertension, unspecified: Secondary | ICD-10-CM | POA: Insufficient documentation

## 2022-12-03 DIAGNOSIS — I10 Essential (primary) hypertension: Secondary | ICD-10-CM | POA: Diagnosis not present

## 2022-12-03 DIAGNOSIS — Z7984 Long term (current) use of oral hypoglycemic drugs: Secondary | ICD-10-CM | POA: Insufficient documentation

## 2022-12-03 LAB — BASIC METABOLIC PANEL
Anion gap: 8 (ref 5–15)
BUN: 25 mg/dL — ABNORMAL HIGH (ref 8–23)
CO2: 27 mmol/L (ref 22–32)
Calcium: 9 mg/dL (ref 8.9–10.3)
Chloride: 106 mmol/L (ref 98–111)
Creatinine, Ser: 1.78 mg/dL — ABNORMAL HIGH (ref 0.44–1.00)
GFR, Estimated: 30 mL/min — ABNORMAL LOW (ref >60)
Glucose, Bld: 149 mg/dL — ABNORMAL HIGH (ref 70–99)
Potassium: 3.7 mmol/L (ref 3.5–5.1)
Sodium: 141 mmol/L (ref 135–145)

## 2022-12-03 LAB — BRAIN NATRIURETIC PEPTIDE: B Natriuretic Peptide: 779 pg/mL — ABNORMAL HIGH (ref 0.0–100.0)

## 2022-12-03 LAB — HEMOGLOBIN A1C
Hgb A1c MFr Bld: 6.8 % — ABNORMAL HIGH (ref 4.8–5.6)
Mean Plasma Glucose: 148.46 mg/dL

## 2022-12-03 MED ORDER — DOXAZOSIN MESYLATE 1 MG PO TABS: 2.0000 mg | ORAL_TABLET | Freq: Every day | ORAL | 2 refills | Status: DC

## 2022-12-03 MED ORDER — FUROSEMIDE 20 MG PO TABS: 40.0000 mg | ORAL_TABLET | ORAL | 3 refills | Status: DC | PRN

## 2022-12-03 NOTE — Progress Notes (Addendum)
ADVANCED HEART FAILURE CLINIC NOTE  Referring Physician: [name], MD  Primary Care: [name], MD Primary Cardiologist: Chilton Si  HPI: [name] is a 72 y.o. 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  She was admitted to [facility] in 6/24 with hypertensive crisis and pulmonary edema requiring BiPAP and IV Lasix.  EF during admission 60 to 65% with moderate to severe LVH.  Since that time she has been seen in [facility] clinic.  Interval history Reports that her blood pressure ranges from 130s-140s at home. She has been feeling increasingly short of breath at home. She wears 3L [place] most of the time at home now. She feels that her symptoms have progressed since her pacemaker placement. Otherwise, at baseline she can perform all ADLs independently but otherwise requires walker/wheelchair for walking longer distances. She knows her medications very well and has great insight into her condition.   Past Medical History:  Diagnosis Date   Anemia    Anxiety    Arthritis    Atrial fib/flutter, transient (HCC)    Cataract    COPD (chronic obstructive pulmonary disease) (HCC)    Depression    Diabetes mellitus    Diverticulosis    DJD (degenerative joint disease)    GERD (gastroesophageal reflux disease)    Gout    History of pneumonia    Hypertension    Hypothyroidism    Internal hemorrhoids     Current Outpatient Medications  Medication Sig Dispense Refill   aspirin 81 MG tablet Take 81 mg by mouth every morning.      carvedilol (COREG) 25 MG tablet Take 1 tablet (25 mg total) by mouth 2 (two) times daily with a meal. 180  tablet 3   Cholecalciferol (VITAMIN D) 50 MCG (2000 UT) CAPS Take 2,000 Units by mouth every morning.     cloNIDine (CATAPRES - DOSED IN MG/24 HR) 0.3 mg/24hr patch Place 1 patch (0.3 mg total) onto the skin once a week. 4 patch 12   clopidogrel (PLAVIX) 75 MG tablet Take 75 mg by mouth daily.     clotrimazole-betamethasone (LOTRISONE) cream Apply 1 Application topically daily as needed (Rash).     doxazosin (CARDURA) 1 MG tablet TAKE 1 TABLET BY MOUTH DAILY (Patient taking differently: Take 2 mg by mouth daily.) 100 tablet 2   empagliflozin (JARDIANCE) 10 MG TABS tablet Take 1 tablet (10 mg total) by mouth daily before breakfast. 30 tablet 3   famotidine (PEPCID) 20 MG tablet Take 20 mg by mouth daily.     gabapentin (NEURONTIN) 100 MG capsule Take 1 capsule (100 mg total) by mouth 3 (three) times daily. 90 capsule 2   glimepiride (AMARYL) 1 MG tablet Take 1 tablet (1 mg total) by mouth every morning. 30 tablet 3   hydrALAZINE (APRESOLINE) 100 MG tablet Take 1 tablet (100 mg total) by mouth 3 (three) times daily. 90 tablet 0   hydrochlorothiazide (HYDRODIURIL) 25 MG tablet Take 1 tablet (25 mg total) by mouth daily. 30 tablet 1   hydrOXYzine (ATARAX) 50 MG tablet Take 1 tablet (50 mg total) by mouth every 8 (eight) hours as needed. 30 tablet 0   isosorbide mononitrate (IMDUR) 60  MG 24 hr tablet TAKE 1 TABLET BY MOUTH DAILY 90 tablet 3   levothyroxine (SYNTHROID) 150 MCG tablet Take 150 mcg by mouth See admin instructions. Take     levothyroxine (SYNTHROID) 25 MCG tablet Take 1 tablet (25 mcg total) by mouth daily. 30 tablet 2   lovastatin (MEVACOR) 20 MG tablet Take 20 mg by mouth at bedtime.     Multiple Vitamin (MULTIVITAMIN) tablet Take 1 tablet by mouth every morning.      Multiple Vitamins-Minerals (HAIR SKIN NAILS PO) Take 5,000 mcg by mouth daily.     Oxycodone HCl 10 MG TABS Take 10 mg by mouth 4 (four) times daily as needed (pain).     OXYGEN Inhale 3 L into the lungs continuous.      pantoprazole (PROTONIX) 40 MG tablet Take 1 tablet (40 mg total) by mouth daily. 30 tablet 1   polyethylene glycol (MIRALAX / GLYCOLAX) packet Take 17 g by mouth daily as needed for mild constipation, moderate constipation or severe constipation.     sertraline (ZOLOFT) 100 MG tablet Take 100 mg by mouth at bedtime.     STIOLTO RESPIMAT 2.5-2.5 MCG/ACT AERS INHALE 2 PUFFS INTO THE LUNGS ONCE DAILY 4 g 5   triamcinolone cream (KENALOG) 0.1 % Apply topically 2 (two) times daily as needed (eczema).     No current facility-administered medications for this encounter.    Allergies  Allergen Reactions   Amlodipine Swelling    Started very close to episode of angioedema to ACE, unknown if this added effects or just residual   Lisinopril     angioedema   Losartan Swelling    Lip swelling    Codeine Phosphate Other (See Comments)    REACTION: UNKNOWN   Morphine And Codeine Other (See Comments)    Cannot take:Heart problems   Naproxen Nausea And Vomiting   Nsaids Nausea Only   Penicillins Other (See Comments)    UNKNOWN REACTION   Propoxyphene N-Acetaminophen Other (See Comments)    Makes me gag   Sulfamethoxazole-Trimethoprim Other (See Comments)    Gi upset   Flexeril [Cyclobenzaprine] Anxiety and Other (See Comments)    Pt states medication gives her nightmares and insomnia.      Social History   Socioeconomic History   Marital status: Widowed    Spouse name: Not on file   Number of children: 2   Years of education: Not on file   Highest education level: Not on file  Occupational History   Occupation: Disabled    Employer: UNEMPLOYED  Tobacco Use   Smoking status: Every Day    Current packs/day: 2.00    Average packs/day: 2.0 packs/day for 54.0 years (108.0 ttl pk-yrs)    Types: Cigarettes    Passive exposure: Never   Smokeless tobacco: Never   Tobacco comments:    3 cigarettes daily.  Trying to quit.  07/08/2022 hfb  Vaping Use   Vaping status: Never Used  Substance  and Sexual Activity   Alcohol use: No   Drug use: No   Sexual activity: Yes    Birth control/protection: Surgical  Other Topics Concern   Not on file  Social History Narrative   Right handed    Has a son   Hostess at hotel    Social Determinants of Health   Financial Resource Strain: Low Risk  (06/24/2022)   Overall Financial Resource Strain (CARDIA)    Difficulty of Paying Living Expenses: Not very hard  Food Insecurity: No  Food Insecurity (06/24/2022)   Hunger Vital Sign    Worried About Running Out of Food in the Last Year: Never true    Ran Out of Food in the Last Year: Never true  Transportation Needs: No Transportation Needs (06/24/2022)   PRAPARE - Administrator, Civil Service (Medical): No    Lack of Transportation (Non-Medical): No  Physical Activity: Inactive (10/09/2022)   Exercise Vital Sign    Days of Exercise per Week: 0 days    Minutes of Exercise per Session: 0 min  Stress: Stress Concern Present (11/28/2019)   [employer] of Occupational Health - Occupational Stress Questionnaire    Feeling of Stress : Rather much  Social Connections: Not on file  Intimate Partner Violence: Not At Risk (06/24/2022)   Humiliation, Afraid, Rape, and Kick questionnaire    Fear of Current or Ex-Partner: No    Emotionally Abused: No    Physically Abused: No    Sexually Abused: No      Family History  Problem Relation Age of Onset   Prostate cancer Father    Hypertension Mother    Heart attack Mother    Heart disease Mother    Kidney disease Mother 66   Stomach cancer Son    Diabetes Maternal Aunt    CAD Maternal Aunt    Stroke Maternal Aunt    Heart disease Maternal Uncle    CAD Maternal Uncle    Stroke Maternal Uncle    Colon cancer Neg Hx    Esophageal cancer Neg Hx    Rectal cancer Neg Hx     PHYSICAL EXAM: Vitals:   12/03/22 1015  BP: (!) 158/98  Pulse: 74  SpO2: 90%   GENERAL: Well nourished, well developed, and in no apparent distress at  rest.  HEENT: Negative for arcus senilis or xanthelasma. There is no scleral icterus.  The mucous membranes are pink and moist.   NECK: Supple, No masses. Normal carotid upstrokes without bruits. No masses or thyromegaly.    CHEST: There are no chest wall deformities. There is no chest wall tenderness. Respirations are unlabored.  Lungs- decreased lung sounds b/l CARDIAC:  JVP: 8 cm          Normal rate with regular rhythm. No murmurs, rubs or gallops.  Pulses are 2+ and symmetrical in upper and lower extremities. 1+ edema.  ABDOMEN: Soft, non-tender, non-distended. There are no masses or hepatomegaly. There are normal bowel sounds.  EXTREMITIES: Warm and well perfused with no cyanosis, clubbing.  LYMPHATIC: No axillary or supraclavicular lymphadenopathy.  NEUROLOGIC: Patient is oriented x3 with no focal or lateralizing neurologic deficits.  PSYCH: Patients affect is appropriate, there is no evidence of anxiety or depression.  SKIN: Warm and dry; no lesions or wounds.    DATA REVIEW  ECG: 07/11/22: atrial paced, LVH as per my personal interpretation  ECHO: 06/23/22: Normal LVEF, severe LVH as per my personal interpretation  ASSESSMENT & PLAN:  Heart failure with preserved ejection fraction -Severe LVH by echocardiogram; myeloma panel negative. - Myeloma panel unremarkable on 07/11/22.  - PYP scan pending -Spironolactone 25 mg daily -Hydralazine 100 mg 3 times daily with Imdur 60 mg -continue jardiance 10mg  daily -Mildly hypervolemic today; take furosemide 40mg  x 1 today then PRN.   2.  Hypertension -Longstanding history of uncontrolled hypertension, moderate left renal artery stenosis on CTA from June 2024. - Referred to hypertension clinic.  - Renal artery doppler without severe stenosis; mild-mod stenosis of the  right renal artery. - Continue hydralazine/imdur as above.  - Increase cardura to 2mg  daily.  - Establish care with Dr. Duke Salvia; may be a good candidate for renal  artery denervation.  3.Chronic respiratory failure/COPD -On chronic O2 at night and with activity -Reviewed PFTs, DLCO severely reduced at 36% -Needs to quit smoking. Has been cutting back gradually.  -I do not suspect she has PH at this time based on her TTE; will discuss need for RHC with Dr. Judeth Horn. She would not be a candidate for much therapy other than Tyvaso given her persistent O2 requirement and severely reduced DLCO.    4. DM II -A1C of 7 on 06/22/22 -continue jardiance   5. 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    6. Sinus node dysfunction s/p PPM Afib - Followed by EP; paced rhythm.    7. CKD IIIb/IV -Likely hypertensive; sCr approaching 2 -Repeat BMP today -Continue jardiance.   8. Renal artery stenosis - Now s/p renal artery duplex - Mild-mod right renal artery stenosis.   I spent 40 minutes caring for this patient today including face to face time, ordering and reviewing labs, discussing BP control, lifestyle changes, reviewing imaging, PFTs (08/27/22) with her , seeing the patient, documenting in the record, and arranging follow ups.    Nijee Heatwole Advanced Heart Failure Mechanical Circulatory Support

## 2022-12-03 NOTE — Patient Instructions (Signed)
INCREASE Cardura to 2 mg daily START Lasix 40 mg as needed for shortness of breath or swelling (3 lb weight gain overnight or 5 lb in one week)  Labs today We will only contact you if something comes back abnormal or we need to make some changes. Otherwise no news is good news!  Your physician recommends that you schedule a follow-up appointment in: 1 months with Dr Gasper Lloyd  Do the following things EVERYDAY: Weigh yourself in the morning before breakfast. Write it down and keep it in a log. Take your medicines as prescribed Eat low salt foods--Limit salt (sodium) to 2000 mg per day.  Stay as active as you can everyday Limit all fluids for the day to less than 2 liters  At the [facility], you and your health needs are our priority. As part of our continuing mission to provide you with exceptional heart care, we have created designated Provider Care Teams. These Care Teams include your primary Cardiologist (physician) and Advanced Practice Providers (APPs- Physician Assistants and Nurse Practitioners) who all work together to provide you with the care you need, when you need it.   You may see any of the following providers on your designated Care Team at your next follow up: Dr Arvilla Meres Dr Marca Ancona Dr. Dorthula Nettles Dr. Clearnce Hasten Amy Filbert Schilder, NP Robbie Lis, [place] [facility], [place] Brynda Peon, NP [place] Lee, NP Karle Plumber, PharmD   Please be sure to bring in all your medications bottles to every appointment.    Thank you for choosing [facility]   If you have any questions or concerns before your next appointment please send [place] a message through [place] or call our office at [phone].    TO LEAVE A MESSAGE FOR THE NURSE SELECT OPTION 2, PLEASE LEAVE A MESSAGE INCLUDING: YOUR NAME DATE OF BIRTH CALL BACK NUMBER REASON FOR CALL**this is important as we prioritize the call  backs  YOU WILL RECEIVE A CALL BACK THE SAME DAY AS LONG AS YOU CALL BEFORE 4:00 PM

## 2022-12-03 NOTE — Progress Notes (Signed)
ReDS Vest / Clip - 12/03/22 1015       ReDS Vest / Clip   Station Marker B    Ruler Value 33    ReDS Value Range High volume overload    ReDS Actual Value 44

## 2022-12-04 ENCOUNTER — Encounter (HOSPITAL_BASED_OUTPATIENT_CLINIC_OR_DEPARTMENT_OTHER): Payer: 59 | Admitting: Family

## 2022-12-04 NOTE — Progress Notes (Deleted)
Advanced Hypertension Clinic Assessment:    Date:  12/04/2022   ID:  [name], DOB [birth year], MRN [id number]  PCP:  [name], MD  Cardiologist:  None  Nephrologist: Dr. Malen Gauze  Referring MD: [name], MD   CC: Hypertension  History of Present Illness:    [name] is a 72 y.o. 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   Previously seen in Advanced Hypertension Clinic 11/28/19 by Dr. Duke Salvia.  Initially diagnosed with hypertension in her late 30s.  Prior CT 09/2019 with no adenomas. Prior carotid duplex 01/2020 with <50% stenosis bilaterally.  Admission 06/2022 for respiratory distress in the setting of hypertensive crisis and pulmonary edema requiring BiPAP briefly.  She was diuresed with IV Lasix.  Course also notable for AKI on CKD, CTA no evidence of aortic dissection, stable ascending aortic aneurysm 4.1 cm, stable left EIA stent, moderate stenosis left renal artery with patent right renal artery.  Echo LVEF 60 to 65%, moderate to severe LVH (recommended for workup for infiltrative disease due to LVH and low GLS). CT 06/2022 with normal adrenal glands.   She has heart and vascular transition of care clinic 07/11/2022.  Myeloma panel, urine immunofixation and serum free light chains were ordered and unremarkable.  Spironolactone added for GDMT of HFpEF.  She was additionally recommended for sleep study.  At follow-up with Dr. Gasper Lloyd 08/04/22 Jardiance was added. Doxazosin initiated for BP control.  Renal duplex 09/19/2022 with 1-59% stenosis of right renal artery and no left renal artery stenosis.  Last seen in  Hypertension Clinic 10/09/22. Diagnosed with hypertension in her late 30s. Her BP at home was 130s/70s. Clonidine tablet transitioned to Clonidine 0.3mg  patch.   Underwent PPM generate change out 11/06/22 with Dr. Ladona Ridgel. Saw Dr. Gasper Lloyd in Advanced Heart Failure Clinic yesterday. PYP scan pending. She was mildly hypervolemic and recommended Furosemide 40mg  x 1 dose then PRN.   ***Takes her initial medication around 9:30 am, 4 pm, 8 pm. Is not bothered by TID dosing and endorses compliance.  *** RDN   Previous antihypertensives: ACE/ARB (Lisiopril, Losartan)-angioedema Amlodipine    Past Medical History:  Diagnosis Date   Anemia    Anxiety    Arthritis    Atrial fib/flutter, transient (HCC)    Cataract    COPD (chronic obstructive pulmonary disease) (HCC)    Depression    Diabetes mellitus    Diverticulosis    DJD (degenerative joint disease)    GERD (gastroesophageal reflux disease)    Gout    History of pneumonia    Hypertension    Hypothyroidism    Internal hemorrhoids     Past Surgical History:  Procedure Laterality Date   ABDOMINAL AORTOGRAM W/LOWER EXTREMITY N/A 07/24/2020   Procedure: ABDOMINAL AORTOGRAM W/LOWER EXTREMITY;  Surgeon: Nada Libman, MD;  Location: [facility];  Service: Cardiovascular;  Laterality: N/A;   CAROTID STENT  05/2021   CATARACT EXTRACTION W/ INTRAOCULAR LENS IMPLANT Left    ENDARTERECTOMY Left 09/09/2019   Procedure: LEFT ILIAC ENDARTERECTOMY;  Surgeon: Nada Libman, MD;  Location: [facility] OR;  Service: Vascular;  Laterality: Left;   HERNIA REPAIR     INSERTION OF  ILIAC STENT Left 09/09/2019   Procedure: INSERTION OF LEFT ILIAC STENT;  Surgeon: Nada Libman, MD;  Location: [facility] OR;  Service: Vascular;  Laterality: Left;   PACEMAKER INSERTION     PERIPHERAL VASCULAR INTERVENTION Left 07/24/2020   Procedure: PERIPHERAL VASCULAR INTERVENTION;  Surgeon: Nada Libman, MD;  Location: [facility];  Service: Cardiovascular;   Laterality: Left;   PPM GENERATOR CHANGEOUT N/A 11/06/2022   Procedure: PPM GENERATOR CHANGEOUT;  Surgeon: Marinus Maw, MD;  Location: [facility];  Service: Cardiovascular;  Laterality: N/A;   THORACIC AORTIC ENDOVASCULAR STENT GRAFT N/A 09/09/2019   Procedure: THORACIC AORTIC ENDOVASCULAR STENT GRAFT WITH LEFT ILIAC CONDUIT;  Surgeon: Nada Libman, MD;  Location: [facility] OR;  Service: Vascular;  Laterality: N/A;   TUBAL LIGATION     ULTRASOUND GUIDANCE FOR VASCULAR ACCESS Left 09/09/2019   Procedure: ULTRASOUND GUIDANCE FOR VASCULAR ACCESS;  Surgeon: Nada Libman, MD;  Location: [facility];  Service: Vascular;  Laterality: Left;   VAGINAL HYSTERECTOMY      Current Medications: No outpatient medications have been marked as taking for the 12/04/22 encounter (Appointment) with Alver Sorrow, NP.     Allergies:   Amlodipine, Lisinopril, Losartan, Codeine phosphate, Morphine and codeine, Naproxen, Nsaids, Penicillins, Propoxyphene n-acetaminophen, Sulfamethoxazole-trimethoprim, and Flexeril [cyclobenzaprine]   Social History   Socioeconomic History   Marital status: Widowed    Spouse name: Not on file   Number of children: 2   Years of education: Not on file   Highest education level: Not on file  Occupational History   Occupation: Disabled    Employer: UNEMPLOYED  Tobacco Use   Smoking status: Every Day    Current packs/day: 2.00    Average packs/day: 2.0 packs/day for 54.0 years (108.0 ttl pk-yrs)    Types: Cigarettes    Passive exposure: Never   Smokeless tobacco: Never   Tobacco comments:    3 cigarettes daily.  Trying to quit.  07/08/2022 hfb  Vaping Use   Vaping status: Never Used  Substance and Sexual Activity   Alcohol use: No   Drug use: No   Sexual activity: Yes    Birth control/protection: Surgical  Other Topics Concern   Not on file  Social History Narrative   Right handed    Has a son   Hostess at hotel    Social Determinants of Health    Financial Resource Strain: Low Risk  (06/24/2022)   Overall Financial Resource Strain (CARDIA)    Difficulty of Paying Living Expenses: Not very hard  Food Insecurity: No Food Insecurity (06/24/2022)   Hunger Vital Sign    Worried About Running Out of Food in the Last Year: Never true    Ran Out of Food in the Last Year: Never true  Transportation Needs: No Transportation Needs (06/24/2022)   PRAPARE - Administrator, Civil Service (Medical): No    Lack of Transportation (Non-Medical): No  Physical Activity: Inactive (10/09/2022)   Exercise Vital Sign    Days of Exercise per Week: 0 days    Minutes of Exercise per Session: 0 min  Stress: Stress Concern Present (11/28/2019)   [employer] of Occupational Health - Occupational Stress Questionnaire    Feeling of Stress : Rather much  Social Connections: Not on file     Family History: The patient's family history includes CAD in her maternal aunt and maternal uncle; Diabetes in her maternal aunt; Heart attack in her mother; Heart  disease in her maternal uncle and mother; Hypertension in her mother; Kidney disease (age of onset: 63) in her mother; Prostate cancer in her father; Stomach cancer in her son; Stroke in her maternal aunt and maternal uncle. There is no history of Colon cancer, Esophageal cancer, or Rectal cancer.  ROS:   Please see the history of present illness.     All other systems reviewed and are negative.  EKGs/Labs/Other Studies Reviewed:        Recent Labs: 06/24/2022: Magnesium 2.1 08/04/2022: ALT 17 10/30/2022: Hemoglobin 11.9; Platelets 226 12/03/2022: B Natriuretic Peptide 779.0; BUN 25; Creatinine, Ser 1.78; Potassium 3.7; Sodium 141   Recent Lipid Panel    Component Value Date/Time   CHOL 141 01/29/2020 0444   TRIG 75 06/24/2022 0110   HDL 44 01/29/2020 0444   CHOLHDL 3.2 01/29/2020 0444   VLDL 17 01/29/2020 0444   LDLCALC 80 01/29/2020 0444   LDLDIRECT 128.9 05/21/2006 0948    Physical  Exam:   VS:  There were no vitals taken for this visit. , BMI There is no height or weight on file to calculate BMI. GENERAL:  Well appearing HEENT: Pupils equal round and reactive, fundi not visualized, oral mucosa unremarkable NECK:  No jugular venous distention, waveform within normal limits, carotid upstroke brisk and symmetric, no bruits, no thyromegaly LYMPHATICS:  No cervical adenopathy LUNGS:  Clear to auscultation bilaterally HEART:  RRR.  PMI not displaced or sustained,S1 and S2 within normal limits, no S3, no S4, no clicks, no rubs, no murmurs ABD:  Flat, positive bowel sounds normal in frequency in pitch, no bruits, no rebound, no guarding, no midline pulsatile mass, no hepatomegaly, no splenomegaly EXT:  2 plus pulses throughout, no edema, no cyanosis no clubbing SKIN:  No rashes no nodules NEURO:  Cranial nerves II through XII grossly intact, motor grossly intact throughout PSYCH:  Cognitively intact, oriented to person place and time   ASSESSMENT/PLAN:    HTN -  Present regimen: Clonidine 0.3mg  patch. Coreg 25mg  BID, Doxazosin 1mg  daily, Hydralazine 100mg  TID, Imdur 60mg  daily, hydrochlorothiazide 25mg  daily Future considerations: Transition hydrochlorothiazide to Spironolactone due to HF benefit and intermittent hypokalemia over the past couple years. Could escalate future Spironolactone dose or Imdur dose to allow for discontinuation of Doxazosin for simplification of regimen.  No ACE/ARB due to prior angioedema (of note, was also on Amlodipine at the time but unlikely contributory) Would be future candidate for RDN when procedure resumes.  Consider renin-aldosterone and TSH with next labs for additional secondary workup.   Chronic respiratory failure/COPD - Follows with pulmonology on home O2 and nocturnal O2. Minimal worsening of her exertional dyspnea over last 3 weeks. Lung sounds clear on exam, no hypoxemia. If progressed, she will contact her pulmonology team.   HFpEF  - Euvolemic and well compensated on exam.  NYHA II with exertional dyspnea over the last 3 weeks although only minimally increased from previous. Weights at home have been stable. Continue current GDMT per Advanced Heart failure team. She will monitor symptoms and contact us if worsened.   DM2 - Continue to follow with PCP.   PAD - follows with [facility].  Tobacco use - Smoking cessation encouraged. Recommend utilization of [id number].    Sinus node dysfunction s/p PPM- Follows  with Dr. Ladona Ridgel of EP.   CKD 3B/IV- Careful titration of diuretic and antihypertensive.    Screening for Secondary Hypertension:     10/09/2022    5:00 PM  Causes  Drugs/Herbals N/A  Renovascular HTN Screened     - Comments Renal duplex 08/2022 no stenosis  Sleep Apnea Not Screened     - Comments Advanced Heart failure team reached out to pulmonology  Pheochromocytoma N/A     - Comments CT 06/2022 normal adrenals  Coarctation of the Aorta N/A     - Comments BP symmetric  Compliance Screened     - Comments Prior issues with compliance, no compliant    Relevant Labs/Studies:    Latest Ref Rng & Units 12/03/2022   10:54 AM 10/30/2022   11:07 AM 08/04/2022    3:16 PM  Basic Labs  Sodium 135 - 145 mmol/L 141  138  141   Potassium 3.5 - 5.1 mmol/L 3.7  4.1  3.4   Creatinine 0.44 - 1.00 mg/dL 2.13  0.86  5.78        Latest Ref Rng & Units 09/05/2019   11:18 AM 08/28/2019    3:20 PM  Thyroid   TSH 0.350 - 4.500 uIU/mL 0.111  0.121              Latest Ref Rng & Units 01/20/2012   12:34 PM  Cortisol  Cortisol  ug/dL 6.7        4/69/6295    9:57 AM  Renovascular   Renal Artery [place] Completed Yes       Disposition:    FU with MD/PharmD in ***2 months    Medication Adjustments/Labs and Tests Ordered: Current medicines are reviewed at length with the patient today.  Concerns regarding medicines are outlined above.  No orders of the defined types were placed in this encounter.  No orders of the  defined types were placed in this encounter.    Signed, Alver Sorrow, NP  12/04/2022 1:46 PM    [facility]

## 2022-12-31 NOTE — Progress Notes (Incomplete)
ADVANCED HEART FAILURE CLINIC NOTE  Referring Physician: [name], MD  Primary Care: [name], MD Primary Cardiologist: Chilton Si  HPI: [name] is a 72 y.o. 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  She was admitted to [facility] in 6/24 with hypertensive crisis and pulmonary edema requiring BiPAP and IV Lasix.  EF during admission 60 to 65% with moderate to severe LVH.  Since that time she has been seen in [facility] clinic.  Interval history Continues to struggle with BP management at home. Reports that her BP ranges from 130-180 at home. She is fairly non-ambulatory (arrived to clinic in wheelchair) due to sciatica / low back pain. Reports compliance with medications. Very concerned about progressing to dialysis.   Past Medical History:  Diagnosis Date   Anemia    Anxiety    Arthritis    Atrial fib/flutter, transient (HCC)    Cataract    COPD (chronic obstructive pulmonary disease) (HCC)    Depression    Diabetes mellitus    Diverticulosis    DJD (degenerative joint disease)    GERD (gastroesophageal reflux disease)    Gout    History of pneumonia    Hypertension    Hypothyroidism    Internal hemorrhoids     Current Outpatient Medications  Medication Sig Dispense Refill   aspirin 81 MG tablet Take 81 mg by mouth every morning.      carvedilol (COREG) 25 MG tablet Take 1 tablet (25 mg total) by mouth 2 (two) times daily with a meal. 180 tablet 3   Cholecalciferol (VITAMIN D) 50 MCG (2000 UT) CAPS Take 2,000 Units by mouth every morning.     cloNIDine (CATAPRES - DOSED IN MG/24 HR) 0.3 mg/24hr patch Place 1  patch (0.3 mg total) onto the skin once a week. 4 patch 12   clopidogrel (PLAVIX) 75 MG tablet Take 75 mg by mouth daily.     clotrimazole-betamethasone (LOTRISONE) cream Apply 1 Application topically daily as needed (Rash).     doxazosin (CARDURA) 1 MG tablet Take 2 tablets (2 mg total) by mouth daily. 180 tablet 2   empagliflozin (JARDIANCE) 10 MG TABS tablet Take 1 tablet (10 mg total) by mouth daily before breakfast. 30 tablet 3   famotidine (PEPCID) 20 MG tablet Take 20 mg by mouth daily.     furosemide (LASIX) 20 MG tablet Take 2 tablets (40 mg total) by mouth as needed for fluid. 60 tablet 3   gabapentin (NEURONTIN) 100 MG capsule Take 1 capsule (100 mg total) by mouth 3 (three) times daily. 90 capsule 2   glimepiride (AMARYL) 1 MG tablet Take 1 tablet (1 mg total) by mouth every morning. 30 tablet 3   hydrALAZINE (APRESOLINE) 100 MG tablet Take 1 tablet (100 mg total) by mouth 3 (three) times daily. 90 tablet 0   hydrochlorothiazide (HYDRODIURIL) 25 MG tablet Take 1 tablet (25 mg total) by mouth daily. 30 tablet 1   hydrOXYzine (ATARAX) 50 MG tablet Take 1 tablet (50 mg total) by mouth every 8 (eight) hours as needed. 30 tablet 0   isosorbide mononitrate (IMDUR) 60 MG 24 hr tablet TAKE 1 TABLET BY MOUTH DAILY 90 tablet 3  levothyroxine (SYNTHROID) 150 MCG tablet Take 150 mcg by mouth See admin instructions. Take     levothyroxine (SYNTHROID) 25 MCG tablet Take 1 tablet (25 mcg total) by mouth daily. 30 tablet 2   lovastatin (MEVACOR) 20 MG tablet Take 20 mg by mouth at bedtime.     Multiple Vitamin (MULTIVITAMIN) tablet Take 1 tablet by mouth every morning.      Multiple Vitamins-Minerals (HAIR SKIN NAILS PO) Take 5,000 mcg by mouth daily.     Oxycodone HCl 10 MG TABS Take 10 mg by mouth 4 (four) times daily as needed (pain).     OXYGEN Inhale 3 L into the lungs continuous.     pantoprazole (PROTONIX) 40 MG tablet Take 1 tablet (40 mg total) by mouth daily. 30 tablet 1   polyethylene  glycol (MIRALAX / GLYCOLAX) packet Take 17 g by mouth daily as needed for mild constipation, moderate constipation or severe constipation.     sertraline (ZOLOFT) 100 MG tablet Take 100 mg by mouth at bedtime.     STIOLTO RESPIMAT 2.5-2.5 MCG/ACT AERS INHALE 2 PUFFS INTO THE LUNGS ONCE DAILY 4 g 5   triamcinolone cream (KENALOG) 0.1 % Apply topically 2 (two) times daily as needed (eczema).     No current facility-administered medications for this visit.    Allergies  Allergen Reactions   Amlodipine Swelling    Started very close to episode of angioedema to ACE, unknown if this added effects or just residual   Lisinopril     angioedema   Losartan Swelling    Lip swelling    Codeine Phosphate Other (See Comments)    REACTION: UNKNOWN   Morphine And Codeine Other (See Comments)    Cannot take:Heart problems   Naproxen Nausea And Vomiting   Nsaids Nausea Only   Penicillins Other (See Comments)    UNKNOWN REACTION   Propoxyphene N-Acetaminophen Other (See Comments)    Makes me gag   Sulfamethoxazole-Trimethoprim Other (See Comments)    Gi upset   Flexeril [Cyclobenzaprine] Anxiety and Other (See Comments)    Pt states medication gives her nightmares and insomnia.      Social History   Socioeconomic History   Marital status: Widowed    Spouse name: Not on file   Number of children: 2   Years of education: Not on file   Highest education level: Not on file  Occupational History   Occupation: Disabled    Employer: UNEMPLOYED  Tobacco Use   Smoking status: Every Day    Current packs/day: 2.00    Average packs/day: 2.0 packs/day for 54.0 years (108.0 ttl pk-yrs)    Types: Cigarettes    Passive exposure: Never   Smokeless tobacco: Never   Tobacco comments:    3 cigarettes daily.  Trying to quit.  07/08/2022 hfb  Vaping Use   Vaping status: Never Used  Substance and Sexual Activity   Alcohol use: No   Drug use: No   Sexual activity: Yes    Birth control/protection:  Surgical  Other Topics Concern   Not on file  Social History Narrative   Right handed    Has a son   Hostess at hotel    Social Determinants of Health   Financial Resource Strain: Low Risk  (06/24/2022)   Overall Financial Resource Strain (CARDIA)    Difficulty of Paying Living Expenses: Not very hard  Food Insecurity: No Food Insecurity (06/24/2022)   Hunger Vital Sign    Worried About Running Out  of Food in the Last Year: Never true    Ran Out of Food in the Last Year: Never true  Transportation Needs: No Transportation Needs (06/24/2022)   PRAPARE - Administrator, Civil Service (Medical): No    Lack of Transportation (Non-Medical): No  Physical Activity: Inactive (10/09/2022)   Exercise Vital Sign    Days of Exercise per Week: 0 days    Minutes of Exercise per Session: 0 min  Stress: Stress Concern Present (11/28/2019)   [employer] of Occupational Health - Occupational Stress Questionnaire    Feeling of Stress : Rather much  Social Connections: Not on file  Intimate Partner Violence: Not At Risk (06/24/2022)   Humiliation, Afraid, Rape, and Kick questionnaire    Fear of Current or Ex-Partner: No    Emotionally Abused: No    Physically Abused: No    Sexually Abused: No      Family History  Problem Relation Age of Onset   Prostate cancer Father    Hypertension Mother    Heart attack Mother    Heart disease Mother    Kidney disease Mother 54   Stomach cancer Son    Diabetes Maternal Aunt    CAD Maternal Aunt    Stroke Maternal Aunt    Heart disease Maternal Uncle    CAD Maternal Uncle    Stroke Maternal Uncle    Colon cancer Neg Hx    Esophageal cancer Neg Hx    Rectal cancer Neg Hx     PHYSICAL EXAM: There were no vitals filed for this visit. GENERAL: Well nourished, well developed, and in no apparent distress at rest.  HEENT: Negative for arcus senilis or xanthelasma. There is no scleral icterus.  The mucous membranes are pink and moist.   NECK:  Supple, No masses. Normal carotid upstrokes without bruits. No masses or thyromegaly.    CHEST: There are no chest wall deformities. There is no chest wall tenderness. Respirations are unlabored.  Lungs- *** CARDIAC:  JVP: *** cm          Normal rate with regular rhythm. No murmurs, rubs or gallops.  Pulses are 2+ and symmetrical in upper and lower extremities. *** edema.  ABDOMEN: Soft, non-tender, non-distended. There are no masses or hepatomegaly. There are normal bowel sounds.  EXTREMITIES: Warm and well perfused with no cyanosis, clubbing.  LYMPHATIC: No axillary or supraclavicular lymphadenopathy.  NEUROLOGIC: Patient is oriented x3 with no focal or lateralizing neurologic deficits.  PSYCH: Patients affect is appropriate, there is no evidence of anxiety or depression.  SKIN: Warm and dry; no lesions or wounds.    DATA REVIEW  ECG: 07/11/22: atrial paced, LVH as per my personal interpretation  ECHO: 06/23/22: Normal LVEF, severe LVH as per my personal interpretation  ASSESSMENT & PLAN:  Heart failure with preserved ejection fraction -SevereLVH by echocardiogram; myeloma panel ordered previously. - Myeloma panel unremarkable on 07/11/22.  - PYP scan pending -Spironolactone 25 mg daily -Hydralazine 100 mg 3 times daily with Imdur 60 mg -Start Jardiance 10 mg. Repeat labs today.   2.  Hypertension -Longstanding history of uncontrolled hypertension, moderate left renal artery stenosis on CTA from June 2024. -Referred to hypertension clinic.  - Obtain renal artery dopplers.  - Continue hydralazine/imdur as above.  - start doxazosin 1mg ; very averse to trying amlodipine right now due to hx of angioedema, although, I believe it was secondary to ARB/ACEi.  - Establish care with Dr. Duke Salvia; may be a good  candidate for renal artery denervation due to history of noncompliance.   3.Chronic respiratory failure/COPD -On chronic O2 at night and with activity -Has not completed PFTs.  Evidence of emphysema noted on prior imaging -Needs to quit smoking. Has been cutting back gradually.    4. DM II -Last A1c was 7.0 -start jardiance 10mg    5. 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 Sinus node dysfunction s/p PPM Afib -She's had very brief Afib < 5 min.  Followed by EP. If has recurrence will need anticoagulation.   7. CKD IIIb/IV -Prior baseline Cr 1.3-1.5 but she's had several AKIs -Etiology HTN? -Most recently Scr has been 1.7-2s. -May need Nephrology referral -Labs today   [name] Advanced Heart Failure Mechanical Circulatory Support

## 2023-01-01 ENCOUNTER — Ambulatory Visit (HOSPITAL_COMMUNITY)
Admission: RE | Admit: 2023-01-01 | Discharge: 2023-01-01 | Disposition: A | Discharge status: Home / Self Care | Payer: 59 | Source: Ambulatory Visit | Attending: Cardiology | Admitting: Cardiology

## 2023-01-01 VITALS — BP 158/98 | HR 87 | Wt 201.4 lb

## 2023-01-01 DIAGNOSIS — Z950 Presence of cardiac pacemaker: Secondary | ICD-10-CM | POA: Insufficient documentation

## 2023-01-01 DIAGNOSIS — I480 Paroxysmal atrial fibrillation: Secondary | ICD-10-CM

## 2023-01-01 DIAGNOSIS — Z87891 Personal history of nicotine dependence: Secondary | ICD-10-CM | POA: Diagnosis not present

## 2023-01-01 DIAGNOSIS — Z7902 Long term (current) use of antithrombotics/antiplatelets: Secondary | ICD-10-CM | POA: Insufficient documentation

## 2023-01-01 DIAGNOSIS — Z7982 Long term (current) use of aspirin: Secondary | ICD-10-CM | POA: Diagnosis not present

## 2023-01-01 DIAGNOSIS — Z79899 Other long term (current) drug therapy: Secondary | ICD-10-CM | POA: Insufficient documentation

## 2023-01-01 DIAGNOSIS — I10 Essential (primary) hypertension: Secondary | ICD-10-CM

## 2023-01-01 DIAGNOSIS — I5032 Chronic diastolic (congestive) heart failure: Secondary | ICD-10-CM

## 2023-01-01 DIAGNOSIS — E1122 Type 2 diabetes mellitus with diabetic chronic kidney disease: Secondary | ICD-10-CM | POA: Diagnosis not present

## 2023-01-01 DIAGNOSIS — J9611 Chronic respiratory failure with hypoxia: Secondary | ICD-10-CM

## 2023-01-01 DIAGNOSIS — N184 Chronic kidney disease, stage 4 (severe): Secondary | ICD-10-CM

## 2023-01-01 DIAGNOSIS — I495 Sick sinus syndrome: Secondary | ICD-10-CM | POA: Diagnosis not present

## 2023-01-01 DIAGNOSIS — J449 Chronic obstructive pulmonary disease, unspecified: Secondary | ICD-10-CM | POA: Insufficient documentation

## 2023-01-01 DIAGNOSIS — G4736 Sleep related hypoventilation in conditions classified elsewhere: Secondary | ICD-10-CM | POA: Diagnosis not present

## 2023-01-01 DIAGNOSIS — I509 Heart failure, unspecified: Secondary | ICD-10-CM

## 2023-01-01 DIAGNOSIS — I701 Atherosclerosis of renal artery: Secondary | ICD-10-CM | POA: Insufficient documentation

## 2023-01-01 DIAGNOSIS — I130 Hypertensive heart and chronic kidney disease with heart failure and stage 1 through stage 4 chronic kidney disease, or unspecified chronic kidney disease: Secondary | ICD-10-CM | POA: Diagnosis not present

## 2023-01-01 DIAGNOSIS — I739 Peripheral vascular disease, unspecified: Secondary | ICD-10-CM

## 2023-01-01 DIAGNOSIS — Z7984 Long term (current) use of oral hypoglycemic drugs: Secondary | ICD-10-CM | POA: Diagnosis not present

## 2023-01-01 DIAGNOSIS — N1832 Chronic kidney disease, stage 3b: Secondary | ICD-10-CM | POA: Insufficient documentation

## 2023-01-01 DIAGNOSIS — I712 Thoracic aortic aneurysm, without rupture, unspecified: Secondary | ICD-10-CM

## 2023-01-01 LAB — BASIC METABOLIC PANEL
Anion gap: 9 (ref 5–15)
BUN: 26 mg/dL — ABNORMAL HIGH (ref 8–23)
CO2: 32 mmol/L (ref 22–32)
Calcium: 8.8 mg/dL — ABNORMAL LOW (ref 8.9–10.3)
Chloride: 102 mmol/L (ref 98–111)
Creatinine, Ser: 2.23 mg/dL — ABNORMAL HIGH (ref 0.44–1.00)
GFR, Estimated: 23 mL/min — ABNORMAL LOW (ref >60)
Glucose, Bld: 103 mg/dL — ABNORMAL HIGH (ref 70–99)
Potassium: 4.2 mmol/L (ref 3.5–5.1)
Sodium: 143 mmol/L (ref 135–145)

## 2023-01-01 LAB — BRAIN NATRIURETIC PEPTIDE: B Natriuretic Peptide: 408.8 pg/mL — ABNORMAL HIGH (ref 0.0–100.0)

## 2023-01-01 MED ORDER — FUROSEMIDE 20 MG PO TABS: ORAL_TABLET | ORAL | 3 refills | Status: DC

## 2023-01-01 MED ORDER — DOXAZOSIN MESYLATE 4 MG PO TABS: 4.0000 mg | ORAL_TABLET | Freq: Every day | ORAL | 3 refills | Status: DC

## 2023-01-01 NOTE — Addendum Note (Signed)
Encounter addended by: Theresia Bough, CMA on: 01/01/2023 3:34 PM  Actions taken: Pharmacy for encounter modified, Order list changed

## 2023-01-01 NOTE — Patient Instructions (Addendum)
Medication Changes:  INCREASE DOXAZOSIN TO 4MG  ONCE DAILY   TAKE LASIX (FUROSEMIDE) 80MG  IN THE MORNING AND 40MG  IN THE EVENING FOR THE NEXT 4 DAYS AND THEN RETURN TO 60MG  IN THE MORNING   Lab Work:  Labs done today, your results will be available in MyChart, we will contact you for abnormal readings.  NEW ORDER PLACED FOR PYP SCAN-SOMEONE WILL REACH OUT TO YOU TO GET THIS SCHEDULED FOR YOU   Follow-Up in: 1 WEEK AS SCHEDULED AT [facility] OFFICE   At the Advanced Heart Failure Clinic, you and your health needs are our priority. We have a designated team specialized in the treatment of Heart Failure. This Care Team includes your primary Heart Failure Specialized Cardiologist (physician), Advanced Practice Providers (APPs- Physician Assistants and Nurse Practitioners), and Pharmacist who all work together to provide you with the care you need, when you need it.   You may see any of the following providers on your designated Care Team at your next follow up:  Dr. Arvilla Meres Dr. Marca Ancona Dr. Dorthula Nettles Dr. Theresia Bough Tonye Becket, NP Robbie Lis, [place] [facility] [place], [place] Brynda Peon, NP [place] Lee, NP Karle Plumber, PharmD   Please be sure to bring in all your medications bottles to every appointment.   Need to Contact us:  If you have any questions or concerns before your next appointment please send [place] a message through [place] or call our office at [phone].    TO LEAVE A MESSAGE FOR THE NURSE SELECT OPTION 2, PLEASE LEAVE A MESSAGE INCLUDING: YOUR NAME DATE OF BIRTH CALL BACK NUMBER REASON FOR CALL**this is important as we prioritize the call backs  YOU WILL RECEIVE A CALL BACK THE SAME DAY AS LONG AS YOU CALL BEFORE 4:00 PM

## 2023-01-01 NOTE — Progress Notes (Signed)
ReDS Vest / Clip - 01/01/23 1200       ReDS Vest / Clip   Station Marker B    Ruler Value 38    ReDS Value Range Low volume    ReDS Actual Value 30

## 2023-01-07 ENCOUNTER — Telehealth: Payer: Self-pay | Admitting: Cardiology

## 2023-01-07 NOTE — Telephone Encounter (Signed)
Lvm to confirm appt for 01/08/23

## 2023-01-08 ENCOUNTER — Other Ambulatory Visit
Admission: RE | Admit: 2023-01-08 | Discharge: 2023-01-08 | Disposition: A | Discharge status: Home / Self Care | Payer: 59 | Source: Ambulatory Visit | Attending: Cardiology | Admitting: Cardiology

## 2023-01-08 ENCOUNTER — Ambulatory Visit: Payer: 59 | Admitting: Cardiology

## 2023-01-08 ENCOUNTER — Other Ambulatory Visit: Admission: RE | Admit: 2023-01-08 | Payer: 59 | Source: Ambulatory Visit

## 2023-01-08 VITALS — BP 184/85 | HR 72 | Wt 198.2 lb

## 2023-01-08 DIAGNOSIS — J9611 Chronic respiratory failure with hypoxia: Secondary | ICD-10-CM

## 2023-01-08 DIAGNOSIS — I10 Essential (primary) hypertension: Secondary | ICD-10-CM

## 2023-01-08 DIAGNOSIS — I5032 Chronic diastolic (congestive) heart failure: Secondary | ICD-10-CM | POA: Diagnosis not present

## 2023-01-08 DIAGNOSIS — N184 Chronic kidney disease, stage 4 (severe): Secondary | ICD-10-CM | POA: Diagnosis not present

## 2023-01-08 LAB — BASIC METABOLIC PANEL
Anion gap: 12 (ref 5–15)
BUN: 25 mg/dL — ABNORMAL HIGH (ref 8–23)
CO2: 31 mmol/L (ref 22–32)
Calcium: 9.2 mg/dL (ref 8.9–10.3)
Chloride: 100 mmol/L (ref 98–111)
Creatinine, Ser: 1.88 mg/dL — ABNORMAL HIGH (ref 0.44–1.00)
GFR, Estimated: 28 mL/min — ABNORMAL LOW (ref >60)
Glucose, Bld: 104 mg/dL — ABNORMAL HIGH (ref 70–99)
Potassium: 4.1 mmol/L (ref 3.5–5.1)
Sodium: 143 mmol/L (ref 135–145)

## 2023-01-08 LAB — BRAIN NATRIURETIC PEPTIDE: B Natriuretic Peptide: 205.4 pg/mL — ABNORMAL HIGH (ref 0.0–100.0)

## 2023-01-08 MED ORDER — FUROSEMIDE 20 MG PO TABS: 20.0000 mg | ORAL_TABLET | Freq: Every day | ORAL | Status: DC

## 2023-01-08 NOTE — Progress Notes (Signed)
ADVANCED HEART FAILURE CLINIC NOTE  Referring Physician: [name], MD  Primary Care: [name], MD Primary Cardiologist: Chilton Si  HPI: [name] is a 72 y.o. 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  She was admitted to [facility] in 6/24 with hypertensive crisis and pulmonary edema requiring BiPAP and IV Lasix.  EF during admission 60 to 65% with moderate to severe LVH.  Since that time she has been seen in [facility] clinic.  Interval history -Compared to her last appointment she reports feeling improvement in her shortness of breath, orthopnea and PND. -She reports that her blood pressures are better controlled at home however did not take any medications this morning. -Last week presented with significant volume overload and shortness of breath.  At that time we increased Lasix to 80 mg in the morning and 40 mg at night.  Her lower extremity edema has improved  Past Medical History:  Diagnosis Date   Anemia    Anxiety    Arthritis    Atrial fib/flutter, transient (HCC)    Cataract    COPD (chronic obstructive pulmonary disease) (HCC)    Depression    Diabetes mellitus    Diverticulosis    DJD (degenerative joint disease)    GERD (gastroesophageal reflux disease)    Gout    History of pneumonia    Hypertension    Hypothyroidism    Internal hemorrhoids     Current Outpatient Medications  Medication Sig Dispense Refill   aspirin 81 MG tablet Take 81 mg by mouth every morning.      carvedilol (COREG) 25 MG tablet Take 1 tablet (25 mg total) by mouth 2 (two) times daily with a meal. 180 tablet 3   Cholecalciferol  (VITAMIN D) 50 MCG (2000 UT) CAPS Take 2,000 Units by mouth every morning.     cloNIDine (CATAPRES - DOSED IN MG/24 HR) 0.3 mg/24hr patch Place 1 patch (0.3 mg total) onto the skin once a week. 4 patch 12   clopidogrel (PLAVIX) 75 MG tablet Take 75 mg by mouth daily.     clotrimazole-betamethasone (LOTRISONE) cream Apply 1 Application topically daily as needed (Rash).     doxazosin (CARDURA) 4 MG tablet Take 1 tablet (4 mg total) by mouth daily. 30 tablet 3   empagliflozin (JARDIANCE) 10 MG TABS tablet Take 1 tablet (10 mg total) by mouth daily before breakfast. 30 tablet 3   famotidine (PEPCID) 20 MG tablet Take 20 mg by mouth daily.     furosemide (LASIX) 20 MG tablet TAKE FUROSEMIDE 80MG  (4) TABLETS IN THE MORNING AND 40MG  (2) TABLETS IN THE EVENING FOR THE NEXT 4 DAYS THEN RETURN TO 60MG  DAILY 90 tablet 3   gabapentin (NEURONTIN) 100 MG capsule Take 1 capsule (100 mg total) by mouth 3 (three) times daily. 90 capsule 2   glimepiride (AMARYL) 1 MG tablet Take 1 tablet (1 mg total) by mouth every morning. 30 tablet 3   hydrALAZINE (APRESOLINE) 100 MG tablet Take 1 tablet (100 mg total) by mouth 3 (three) times daily. 90 tablet 0   hydrochlorothiazide (HYDRODIURIL) 25 MG tablet Take 1 tablet (25 mg total) by mouth daily. 30 tablet 1  hydrOXYzine (ATARAX) 50 MG tablet Take 1 tablet (50 mg total) by mouth every 8 (eight) hours as needed. 30 tablet 0   isosorbide mononitrate (IMDUR) 60 MG 24 hr tablet TAKE 1 TABLET BY MOUTH DAILY 90 tablet 3   levothyroxine (SYNTHROID) 150 MCG tablet Take 150 mcg by mouth See admin instructions. Take     levothyroxine (SYNTHROID) 25 MCG tablet Take 1 tablet (25 mcg total) by mouth daily. 30 tablet 2   lovastatin (MEVACOR) 20 MG tablet Take 20 mg by mouth at bedtime.     Multiple Vitamin (MULTIVITAMIN) tablet Take 1 tablet by mouth every morning.      Multiple Vitamins-Minerals (HAIR SKIN NAILS PO) Take 5,000 mcg by mouth daily.     Oxycodone HCl 10 MG TABS Take 10  mg by mouth 4 (four) times daily as needed (pain).     OXYGEN Inhale 3 L into the lungs continuous.     pantoprazole (PROTONIX) 40 MG tablet Take 1 tablet (40 mg total) by mouth daily. 30 tablet 1   polyethylene glycol (MIRALAX / GLYCOLAX) packet Take 17 g by mouth daily as needed for mild constipation, moderate constipation or severe constipation.     sertraline (ZOLOFT) 100 MG tablet Take 100 mg by mouth at bedtime.     STIOLTO RESPIMAT 2.5-2.5 MCG/ACT AERS INHALE 2 PUFFS INTO THE LUNGS ONCE DAILY 4 g 5   triamcinolone cream (KENALOG) 0.1 % Apply topically 2 (two) times daily as needed (eczema).     No current facility-administered medications for this visit.    Allergies  Allergen Reactions   Amlodipine Swelling    Started very close to episode of angioedema to ACE, unknown if this added effects or just residual   Lisinopril     angioedema   Losartan Swelling    Lip swelling    Codeine Phosphate Other (See Comments)    REACTION: UNKNOWN   Morphine And Codeine Other (See Comments)    Cannot take:Heart problems   Naproxen Nausea And Vomiting   Nsaids Nausea Only   Penicillins Other (See Comments)    UNKNOWN REACTION   Propoxyphene N-Acetaminophen Other (See Comments)    Makes me gag   Sulfamethoxazole-Trimethoprim Other (See Comments)    Gi upset   Flexeril [Cyclobenzaprine] Anxiety and Other (See Comments)    Pt states medication gives her nightmares and insomnia.      Social History   Socioeconomic History   Marital status: Widowed    Spouse name: Not on file   Number of children: 2   Years of education: Not on file   Highest education level: Not on file  Occupational History   Occupation: Disabled    Employer: UNEMPLOYED  Tobacco Use   Smoking status: Every Day    Current packs/day: 2.00    Average packs/day: 2.0 packs/day for 54.0 years (108.0 ttl pk-yrs)    Types: Cigarettes    Passive exposure: Never   Smokeless tobacco: Never   Tobacco comments:    3  cigarettes daily.  Trying to quit.  07/08/2022 hfb  Vaping Use   Vaping status: Never Used  Substance and Sexual Activity   Alcohol use: No   Drug use: No   Sexual activity: Yes    Birth control/protection: Surgical  Other Topics Concern   Not on file  Social History Narrative   Right handed    Has a son   Hostess at hotel    Social Drivers of Health   Financial  Resource Strain: Low Risk  (06/24/2022)   Overall Financial Resource Strain (CARDIA)    Difficulty of Paying Living Expenses: Not very hard  Food Insecurity: No Food Insecurity (06/24/2022)   Hunger Vital Sign    Worried About Running Out of Food in the Last Year: Never true    Ran Out of Food in the Last Year: Never true  Transportation Needs: No Transportation Needs (06/24/2022)   PRAPARE - Administrator, Civil Service (Medical): No    Lack of Transportation (Non-Medical): No  Physical Activity: Inactive (10/09/2022)   Exercise Vital Sign    Days of Exercise per Week: 0 days    Minutes of Exercise per Session: 0 min  Stress: Stress Concern Present (11/28/2019)   [employer] of Occupational Health - Occupational Stress Questionnaire    Feeling of Stress : Rather much  Social Connections: Not on file  Intimate Partner Violence: Not At Risk (06/24/2022)   Humiliation, Afraid, Rape, and Kick questionnaire    Fear of Current or Ex-Partner: No    Emotionally Abused: No    Physically Abused: No    Sexually Abused: No      Family History  Problem Relation Age of Onset   Prostate cancer Father    Hypertension Mother    Heart attack Mother    Heart disease Mother    Kidney disease Mother 47   Stomach cancer Son    Diabetes Maternal Aunt    CAD Maternal Aunt    Stroke Maternal Aunt    Heart disease Maternal Uncle    CAD Maternal Uncle    Stroke Maternal Uncle    Colon cancer Neg Hx    Esophageal cancer Neg Hx    Rectal cancer Neg Hx     PHYSICAL EXAM: Vitals:   01/08/23 1021  BP: (!) 184/85   Pulse: 72  SpO2: 97%   GENERAL: Well nourished, well developed, and in no apparent distress at rest.  HEENT: Negative for arcus senilis or xanthelasma. There is no scleral icterus.  The mucous membranes are pink and moist.   NECK: Supple, No masses. Normal carotid upstrokes without bruits. No masses or thyromegaly.    CHEST: There are no chest wall deformities. There is no chest wall tenderness. Respirations are unlabored.  Lungs-mild crackles at bases but otherwise CTA CARDIAC:  JVP: 8 cm          Normal rate with regular rhythm. No murmurs, rubs or gallops.  Pulses are 2+ and symmetrical in upper and lower extremities.  1+ edema.  ABDOMEN: Soft, non-tender, non-distended. There are no masses or hepatomegaly. There are normal bowel sounds.  EXTREMITIES: Warm and well perfused with no cyanosis, clubbing.  LYMPHATIC: No axillary or supraclavicular lymphadenopathy.  NEUROLOGIC: Patient is oriented x3 with no focal or lateralizing neurologic deficits.  PSYCH: Patients affect is appropriate, there is no evidence of anxiety or depression.  SKIN: Warm and dry; no lesions or wounds.     DATA REVIEW  ECG: 07/11/22: atrial paced, LVH as per my personal interpretation  ECHO: 06/23/22: Normal LVEF, severe LVH as per my personal interpretation  ASSESSMENT & PLAN:  Heart failure with preserved ejection fraction -Severe LVH by echocardiogram likely secondary to uncontrolled HTN. Will r/o ATTR amyloid.  - Myeloma panel unremarkable on 07/11/22.  - PYP scan pending; re-ordered today -Spironolactone 25 mg daily -Hydralazine 100 mg 3 times daily with Imdur 60 mg -Continue Jardiance 10 mg.  -Volume status much improved on exam  today.  Transition to Lasix 60 mg daily. -Repeat BMP/BNP today.  2.  Uncontrolled Hypertension -Longstanding history of uncontrolled hypertension, moderate left renal artery stenosis on CTA from June 2024. -Referred to hypertension clinic.  - No renal artery stenosis by  dopplers from 09/19/22 -She did not take her medications this morning.  Will continue hydralazine/Imdur as noted above.  Continue Cardura 4 mg. -Counseled extensively on the importance of compliance with medications.  3.Chronic respiratory failure/COPD -On chronic O2 at night and with activity - PFTs from 08/27/22 with FVC of 1.89L (61%), FEV1 1.42 (61%), FEV/FVC 99% predicted, DLCO 36% predicted. -Reports that dyspnea has improved since diuresis. -She may be a candidate for Tyvaso if there is significant fibrotic disease   4. DM II -Last A1c was 7.0 -continue jardiance 10mg  daily   5. 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 Sinus node dysfunction s/p PPM Afib -She's had very brief Afib < 5 min.  Followed by EP. If has recurrence will need anticoagulation. -NSR on my exam today.   7. 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  8. Tobacco use - Reports that she has now stopped smoking. Last cigarete was 3 weeks ago.   I spent 35 minutes caring for this patient today including face to face time, ordering and reviewing labs, seeing the patient, documenting in the record, and arranging follow ups.   Mitsy Owen Advanced Heart Failure Mechanical Circulatory Support

## 2023-01-08 NOTE — Patient Instructions (Signed)
START TAKING LASIX 60 MG ONCE DAILY  Go over to the [facility]. Go pass the gift shop and have your blood work completed.  We will only call you if the results are abnormal or if the provider would like to make medication changes.

## 2023-01-19 ENCOUNTER — Telehealth: Payer: Self-pay

## 2023-01-19 NOTE — Telephone Encounter (Signed)
-----   Message from Donnie Coffin sent at 01/19/2023  8:03 AM EST ----- Good Morning, Still pending to schedule. :) ----- Message ----- From: Minette Brine Sent: 01/12/2023  10:06 AM EST To: Chinita Pester, CMA  Myrlene Broker ----- Message ----- From: Chinita Pester, CMA Sent: 01/12/2023   9:32 AM EST To: Minette Brine  Yes, jasmine no longer works in our office, you can send all pre cert request to me for now on ----- Message ----- From: Minette Brine Sent: 01/12/2023   7:55 AM EST To: Chinita Pester, CMA  Can you please help? Did Jasmin Leave? ----- Message ----- From: Minette Brine Sent: 01/05/2023  11:09 AM EST To: Modesta Messing, CMA  Patient needs Prior Auth for ordered Amyloid please.

## 2023-01-23 ENCOUNTER — Other Ambulatory Visit: Payer: Self-pay

## 2023-01-23 MED ORDER — FUROSEMIDE 20 MG PO TABS: 60.0000 mg | ORAL_TABLET | Freq: Every day | ORAL | 3 refills | Status: DC

## 2023-01-23 NOTE — Telephone Encounter (Signed)
 Received fax from [facility] for dosing clarification for furosemide, clarified and Rx sent.

## 2023-01-26 ENCOUNTER — Other Ambulatory Visit: Payer: Self-pay

## 2023-01-26 MED ORDER — FUROSEMIDE 20 MG PO TABS: 60.0000 mg | ORAL_TABLET | Freq: Every day | ORAL | 3 refills | Status: DC

## 2023-01-28 ENCOUNTER — Other Ambulatory Visit (HOSPITAL_COMMUNITY): Payer: Self-pay | Admitting: *Deleted

## 2023-01-28 DIAGNOSIS — I482 Chronic atrial fibrillation, unspecified: Secondary | ICD-10-CM

## 2023-01-28 DIAGNOSIS — I5032 Chronic diastolic (congestive) heart failure: Secondary | ICD-10-CM

## 2023-01-28 MED ORDER — FUROSEMIDE 20 MG PO TABS: 60.0000 mg | ORAL_TABLET | Freq: Every day | ORAL | 3 refills | Status: DC

## 2023-02-05 ENCOUNTER — Ambulatory Visit (HOSPITAL_COMMUNITY): Payer: 59 | Attending: Cardiology

## 2023-02-05 DIAGNOSIS — I509 Heart failure, unspecified: Secondary | ICD-10-CM | POA: Insufficient documentation

## 2023-02-05 LAB — MYOCARDIAL AMYLOID PLANAR & SPECT: H/CL Ratio: 1.21

## 2023-02-05 MED ORDER — TECHNETIUM TC 99M PYROPHOSPHATE
21.8000 | Freq: Once | INTRAVENOUS | Status: AC
  Administered 2023-02-05: 21.8 via INTRAVENOUS

## 2023-02-06 ENCOUNTER — Ambulatory Visit: Payer: 59

## 2023-02-06 DIAGNOSIS — I480 Paroxysmal atrial fibrillation: Secondary | ICD-10-CM | POA: Diagnosis not present

## 2023-02-06 LAB — CUP PACEART REMOTE DEVICE CHECK
Battery Remaining Longevity: 151 mo
Battery Voltage: 3.21 V
Brady Statistic AP VP Percent: 0.15 %
Brady Statistic AP VS Percent: 83.58 %
Brady Statistic AS VP Percent: 0.26 %
Brady Statistic AS VS Percent: 16.01 %
Brady Statistic RA Percent Paced: 83.58 %
Brady Statistic RV Percent Paced: 0.42 %
Date Time Interrogation Session: 20250117020100
Implantable Lead Connection Status: 753985
Implantable Lead Connection Status: 753985
Implantable Lead Implant Date: 19970929
Implantable Lead Implant Date: 19970929
Implantable Lead Location: 753859
Implantable Lead Location: 753860
Implantable Lead Model: 4068
Implantable Lead Model: 5092
Implantable Pulse Generator Implant Date: 20241017
Lead Channel Impedance Value: 304 Ohm
Lead Channel Impedance Value: 323 Ohm
Lead Channel Impedance Value: 361 Ohm
Lead Channel Impedance Value: 399 Ohm
Lead Channel Pacing Threshold Amplitude: 0.75 V
Lead Channel Pacing Threshold Amplitude: 1.375 V
Lead Channel Pacing Threshold Pulse Width: 0.4 ms
Lead Channel Pacing Threshold Pulse Width: 0.4 ms
Lead Channel Sensing Intrinsic Amplitude: 0.875 mV
Lead Channel Sensing Intrinsic Amplitude: 0.875 mV
Lead Channel Sensing Intrinsic Amplitude: 11.125 mV
Lead Channel Sensing Intrinsic Amplitude: 11.125 mV
Lead Channel Setting Pacing Amplitude: 2 V
Lead Channel Setting Pacing Amplitude: 2 V
Lead Channel Setting Pacing Pulse Width: 0.4 ms
Lead Channel Setting Sensing Sensitivity: 1.2 mV
Zone Setting Status: 755011

## 2023-02-09 ENCOUNTER — Ambulatory Visit: Payer: 59 | Admitting: Pulmonary Disease

## 2023-02-09 ENCOUNTER — Encounter: Payer: Self-pay | Admitting: Pulmonary Disease

## 2023-02-09 ENCOUNTER — Encounter: Payer: 59 | Admitting: Internal Medicine

## 2023-02-09 VITALS — BP 127/78 | HR 76 | Temp 98.2°F | Ht 65.0 in | Wt 186.0 lb

## 2023-02-09 DIAGNOSIS — J9611 Chronic respiratory failure with hypoxia: Secondary | ICD-10-CM | POA: Diagnosis not present

## 2023-02-09 NOTE — Progress Notes (Signed)
@Patient  ID: [name], female    DOB: [birth year], 73 y.o.   MRN: [id number]  Chief Complaint  Patient presents with   Follow-up    DOE-f/u O2 may be drying nose out and causing nose bleeds     Referring provider: [name], MD  HPI:   73 y.o. woman whom we are seeing in follow up for evaluation of history of oxygen use and DOE.  Multiple cardiology notes reviewed.  Undergoing care with heart failure team as well as electrophysiology.  Currently on Stiolto.  To help with dyspnea.  Using 1 L oxygen overnight based on the overnight oximetry, desaturation.  Historically using 2 L pulsed with exertion, with some recent volume overload with increased to 4.  However she appears euvolemic.  Volume overload better.  Her episodes of desaturation have improved with diuresis.  HPI at initial visit: Patient was mid to the hospital 06/2020 with severe allergic/near anaphylactic reaction.  Seem to be the antibiotic.  VBG showed hypercarbia PCO2 in the 60s.  Reportedly hypoxemic.  Very wheezy.  Treated with supportive care.  Received antihistamines, epinephrine injection, steroids.  Gradually improved.  Was discharged on 2 L oxygen after approximate 24 hours in the hospital.  Relatively fast discharge especially with new oxygen requirement.  Quickly stopped using at home.  Notes checks her oxygen saturation.  Lowest this gets as 93% during the day when she is walking around.  She continues 1 L at night.  She does this because she was told to.  No overnight oximetry.  She endorses ongoing smoking of cigarettes.  Has cut down.  Wishes to decrease further.  Cites son's illness in [place] stressor.  She denies any real breathing issues.  Prior to that acute illness, no breathing issues.  No respiratory issues.  No dyspnea on exertion.  He is not limited in any way from exertional capacity.  Occasional cough but not bothersome.  Never had PFTs per her report.  Review chest x-ray during admission  06-2020 on my interpretation reveals clear lungs bilaterally.   PMH: Tobacco abuse in remission Surgical history: Hysterectomy, tubal ligation, hernia repair, endarterectomy Family history: Father with prostate cancer, mother with CAD, hypertension, CKD Social history: Current smoker, does not have a pack a day.  50-pack-year smoking history, smoked a pack a day for 50 years before cutting down recently, lives in [place] / Pulmonary Flowsheets:   ACT:      No data to display          MMRC:     No data to display          Epworth:      No data to display          Tests:   FENO:  No results found for: "NITRICOXIDE"  PFT:    Latest Ref Rng & Units 08/27/2022    2:05 PM  PFT Results  FVC-Pre L 1.89   FVC-Predicted Pre % 61   FVC-Post L 1.76   FVC-Predicted Post % 57   Pre FEV1/FVC % % 75   Post FEV1/FCV % % 79   FEV1-Pre L 1.42   FEV1-Predicted Pre % 61   FEV1-Post L 1.39   DLCO uncorrected ml/min/mmHg 7.36   DLCO UNC% % 36   DLCO corrected ml/min/mmHg 7.36   DLCO COR %Predicted % 36   DLVA Predicted % 46   TLC L 4.46   TLC % Predicted % 85   RV %  Predicted % 101     WALK:      No data to display          Imaging: Personally reviewed and as per EMR discussion this note  Lab Results: Personally reviewed, elevated eosinophils notably CBC    Component Value Date/Time   WBC 8.3 10/30/2022 1107   RBC 3.78 (L) 10/30/2022 1107   HGB 11.9 (L) 10/30/2022 1107   HCT 37.4 10/30/2022 1107   PLT 226 10/30/2022 1107   MCV 98.9 10/30/2022 1107   MCH 31.5 10/30/2022 1107   MCHC 31.8 10/30/2022 1107   RDW 18.9 (H) 10/30/2022 1107   LYMPHSABS 1.0 06/23/2022 0249   MONOABS 0.3 06/23/2022 0249   EOSABS 0.0 06/23/2022 0249   BASOSABS 0.0 06/23/2022 0249    BMET    Component Value Date/Time   NA 143 01/08/2023 1113   K 4.1 01/08/2023 1113   CL 100 01/08/2023 1113   CO2 31 01/08/2023 1113   GLUCOSE 104 (H) 01/08/2023 1113   BUN  25 (H) 01/08/2023 1113   CREATININE 1.88 (H) 01/08/2023 1113   CALCIUM 9.2 01/08/2023 1113   GFRNONAA 28 (L) 01/08/2023 1113   GFRAA 48 (L) 09/19/2019 0140    BNP    Component Value Date/Time   BNP 205.4 (H) 01/08/2023 1113    ProBNP No results found for: "PROBNP"  Specialty Problems       Pulmonary Problems   OBSTRUCTIVE SLEEP APNEA   Qualifier: Diagnosis of  By: Jonny Ruiz MD, Len Blalock       ASTHMATIC BRONCHITIS, ACUTE   Qualifier: Diagnosis of  By: Jonny Ruiz MD, Len Blalock       Hypoxia    Allergies  Allergen Reactions   Amlodipine Swelling    Started very close to episode of angioedema to ACE, unknown if this added effects or just residual   Lisinopril     angioedema   Losartan Swelling    Lip swelling    Codeine Phosphate Other (See Comments)    REACTION: UNKNOWN   Morphine And Codeine Other (See Comments)    Cannot take:Heart problems   Naproxen Nausea And Vomiting   Nsaids Nausea Only   Penicillins Other (See Comments)    UNKNOWN REACTION   Propoxyphene N-Acetaminophen Other (See Comments)    Makes me gag   Sulfamethoxazole-Trimethoprim Other (See Comments)    Gi upset   Flexeril [Cyclobenzaprine] Anxiety and Other (See Comments)    Pt states medication gives her nightmares and insomnia.    Immunization History  Administered Date(s) Administered   H1N1 02/16/2008   Influenza Whole 11/22/2008   PFIZER(Purple Top)SARS-COV-2 Vaccination 06/25/2019, 08/22/2019   PNEUMOCOCCAL CONJUGATE-20 06/25/2022   Pfizer Covid-19 Vaccine Bivalent Booster 16yrs & up 02/24/2020, 09/21/2020   Pneumococcal Polysaccharide-23 05/21/2006    Past Medical History:  Diagnosis Date   Anemia    Anxiety    Arthritis    Atrial fib/flutter, transient (HCC)    Cataract    COPD (chronic obstructive pulmonary disease) (HCC)    Depression    Diabetes mellitus    Diverticulosis    DJD (degenerative joint disease)    GERD (gastroesophageal reflux disease)    Gout    History of  pneumonia    Hypertension    Hypothyroidism    Internal hemorrhoids     Tobacco History: Social History   Tobacco Use  Smoking Status Former   Current packs/day: 2.00   Average packs/day: 2.0 packs/day for 54.0 years (108.0 ttl pk-yrs)  Types: Cigarettes   Passive exposure: Never  Smokeless Tobacco Never  Tobacco Comments   Pt quit smoking July 2024   Counseling given: Not Answered Tobacco comments: Pt quit smoking July 2024   Outpatient Encounter Medications as of 02/09/2023  Medication Sig   aspirin 81 MG tablet Take 81 mg by mouth every morning.    carvedilol (COREG) 25 MG tablet Take 1 tablet (25 mg total) by mouth 2 (two) times daily with a meal.   Cholecalciferol (VITAMIN D) 50 MCG (2000 UT) CAPS Take 2,000 Units by mouth every morning.   cloNIDine (CATAPRES - DOSED IN MG/24 HR) 0.3 mg/24hr patch Place 1 patch (0.3 mg total) onto the skin once a week.   clopidogrel (PLAVIX) 75 MG tablet Take 75 mg by mouth daily.   clotrimazole-betamethasone (LOTRISONE) cream Apply 1 Application topically daily as needed (Rash).   doxazosin (CARDURA) 4 MG tablet Take 1 tablet (4 mg total) by mouth daily.   empagliflozin (JARDIANCE) 10 MG TABS tablet Take 1 tablet (10 mg total) by mouth daily before breakfast.   famotidine (PEPCID) 20 MG tablet Take 20 mg by mouth daily.   furosemide (LASIX) 20 MG tablet Take 3 tablets (60 mg total) by mouth daily.   gabapentin (NEURONTIN) 100 MG capsule Take 1 capsule (100 mg total) by mouth 3 (three) times daily.   glimepiride (AMARYL) 1 MG tablet Take 1 tablet (1 mg total) by mouth every morning.   hydrALAZINE (APRESOLINE) 100 MG tablet Take 1 tablet (100 mg total) by mouth 3 (three) times daily.   hydrochlorothiazide (HYDRODIURIL) 25 MG tablet Take 1 tablet (25 mg total) by mouth daily.   hydrOXYzine (ATARAX) 50 MG tablet Take 1 tablet (50 mg total) by mouth every 8 (eight) hours as needed.   isosorbide mononitrate (IMDUR) 60 MG 24 hr tablet TAKE 1  TABLET BY MOUTH DAILY   levothyroxine (SYNTHROID) 150 MCG tablet Take 150 mcg by mouth See admin instructions. Take   levothyroxine (SYNTHROID) 25 MCG tablet Take 1 tablet (25 mcg total) by mouth daily.   lovastatin (MEVACOR) 20 MG tablet Take 20 mg by mouth at bedtime.   Multiple Vitamin (MULTIVITAMIN) tablet Take 1 tablet by mouth every morning.    Multiple Vitamins-Minerals (HAIR SKIN NAILS PO) Take 5,000 mcg by mouth daily.   Oxycodone HCl 10 MG TABS Take 10 mg by mouth 4 (four) times daily as needed (pain).   OXYGEN Inhale 3 L into the lungs continuous.   pantoprazole (PROTONIX) 40 MG tablet Take 1 tablet (40 mg total) by mouth daily.   polyethylene glycol (MIRALAX / GLYCOLAX) packet Take 17 g by mouth daily as needed for mild constipation, moderate constipation or severe constipation.   sertraline (ZOLOFT) 100 MG tablet Take 100 mg by mouth at bedtime.   STIOLTO RESPIMAT 2.5-2.5 MCG/ACT AERS INHALE 2 PUFFS INTO THE LUNGS ONCE DAILY   triamcinolone cream (KENALOG) 0.1 % Apply topically 2 (two) times daily as needed (eczema).   No facility-administered encounter medications on file as of 02/09/2023.     Review of Systems  Review of Systems  N/a Physical Exam  BP 127/78 (BP Location: Left Arm, Patient Position: Sitting, Cuff Size: Large)   Pulse 76   Temp 98.2 F (36.8 C) (Oral)   Ht 5\' 5"  (1.651 m)   Wt 186 lb (84.4 kg)   SpO2 94%   BMI 30.95 kg/m   Wt Readings from Last 5 Encounters:  02/09/23 186 lb (84.4 kg)  02/05/23 198  lb (89.8 kg)  01/08/23 198 lb 3.2 oz (89.9 kg)  01/01/23 201 lb 6.4 oz (91.4 kg)  12/03/22 195 lb 6.4 oz (88.6 kg)    BMI Readings from Last 5 Encounters:  02/09/23 30.95 kg/m  02/05/23 32.95 kg/m  01/08/23 32.98 kg/m  01/01/23 33.51 kg/m  12/03/22 32.52 kg/m    Physical Exam General: Well-appearing, no acute distress Eyes: EOMI, icterus Neck: Supple, no JVP Pulmonary: Distant, clear, normal work of breathing Cardiovascular: Regular  in rhythm, no murmur Abdomen: Nondistended, bowel sounds present MSK: No synovitis, no joint effusion Neuro: Normal gait, no weakness Psych: Normal mood, full affect   Assessment & Plan:   History of severe allergic reaction/anaphylaxis: Presented with hypercarbia and hypoxemia.  Suspect this was acute bronchospasm.  Possible underlying COPD.  Emphysema seen on CT scan.  DOE: Likely multifactorial related to deconditioning as well as smoking-related disease.  Emphysema on CT scan.  More recent decompensations and then hypertensive and congestive heart failure with volume overload in the lungs.  Stiolto for bronchodilation has helped, she was advised to continue this.  PFTs largely normal with the exception of severe reduced DLCO, likely in the setting of emphysema and intermittent fluid overload, no evidence of ILD on cross-sectional imaging.   Nocturnal hypoxemia: Confirmed on overnight oximetry.  Using 1 L nasal cannula to continue at night.  New order for humidity, humidification to be added to help with intermittent nosebleeds.  Chronic hypoxemic respiratory failure: Historically required 2 L pulsed to maintain oxygen saturations.  Currently using 4 L.  Evaluate candidacy for POC today.  Unfortunately did not maintain adequate flow for safe oxygen levels.  Return in about 6 months (around 08/09/2023) for f/u Dr. Judeth Horn.   Karren Burly, MD 02/09/2023

## 2023-02-09 NOTE — Addendum Note (Signed)
Addended by: Gay Filler T on: 02/09/2023 02:27 PM   Modules accepted: Orders

## 2023-02-11 ENCOUNTER — Encounter (HOSPITAL_COMMUNITY): Payer: 59 | Admitting: Cardiology

## 2023-02-18 ENCOUNTER — Ambulatory Visit: Payer: 59 | Attending: Internal Medicine | Admitting: Internal Medicine

## 2023-02-18 ENCOUNTER — Encounter: Payer: Self-pay | Admitting: Internal Medicine

## 2023-02-18 VITALS — BP 130/80 | HR 72 | Ht 65.0 in | Wt 190.2 lb

## 2023-02-18 DIAGNOSIS — I495 Sick sinus syndrome: Secondary | ICD-10-CM | POA: Diagnosis not present

## 2023-02-18 LAB — CUP PACEART INCLINIC DEVICE CHECK
Date Time Interrogation Session: 20250129203742
Implantable Lead Connection Status: 753985
Implantable Lead Connection Status: 753985
Implantable Lead Implant Date: 19970929
Implantable Lead Implant Date: 19970929
Implantable Lead Location: 753859
Implantable Lead Location: 753860
Implantable Lead Model: 4068
Implantable Lead Model: 5092
Implantable Pulse Generator Implant Date: 20241017

## 2023-02-18 NOTE — Progress Notes (Signed)
HPI Ms. Brandstetter returns for followup of sinus node dysfunction s/p PPM insertion. She is a pleasant 73 yo woman with a h/o peripheral vascular disease, HTN, sinus node dysfunction, s/p PPM insertion. She has reached ERI on her PPM and underwent gen change out 3 months ago. No edema, chest pain or syncope. She is now on both plavix and ASA. She notes that she did not take her bp meds this morning.  Allergies    Allergies  Allergen Reactions   Amlodipine Swelling    Started very close to episode of angioedema to ACE, unknown if this added effects or just residual   Lisinopril     angioedema   Losartan Swelling    Lip swelling    Codeine Phosphate Other (See Comments)    REACTION: UNKNOWN   Morphine And Codeine Other (See Comments)    Cannot take:Heart problems   Naproxen Nausea And Vomiting   Nsaids Nausea Only   Penicillins Other (See Comments)    UNKNOWN REACTION   Propoxyphene N-Acetaminophen Other (See Comments)    Makes me gag   Sulfamethoxazole-Trimethoprim Other (See Comments)    Gi upset   Flexeril [Cyclobenzaprine] Anxiety and Other (See Comments)    Pt states medication gives her nightmares and insomnia.     Current Outpatient Medications  Medication Sig Dispense Refill   aspirin 81 MG tablet Take 81 mg by mouth every morning.      carvedilol (COREG) 25 MG tablet Take 1 tablet (25 mg total) by mouth 2 (two) times daily with a meal. 180 tablet 3   Cholecalciferol (VITAMIN D) 50 MCG (2000 UT) CAPS Take 2,000 Units by mouth every morning.     cloNIDine (CATAPRES - DOSED IN MG/24 HR) 0.3 mg/24hr patch Place 1 patch (0.3 mg total) onto the skin once a week. 4 patch 12   clopidogrel (PLAVIX) 75 MG tablet Take 75 mg by mouth daily.     clotrimazole-betamethasone (LOTRISONE) cream Apply 1 Application topically daily as needed (Rash).     doxazosin (CARDURA) 4 MG tablet Take 1 tablet (4 mg total) by mouth daily. 30 tablet 3   empagliflozin (JARDIANCE) 10 MG TABS tablet  Take 1 tablet (10 mg total) by mouth daily before breakfast. 30 tablet 3   famotidine (PEPCID) 20 MG tablet Take 20 mg by mouth daily.     furosemide (LASIX) 20 MG tablet Take 3 tablets (60 mg total) by mouth daily. 270 tablet 3   gabapentin (NEURONTIN) 100 MG capsule Take 1 capsule (100 mg total) by mouth 3 (three) times daily. 90 capsule 2   glimepiride (AMARYL) 1 MG tablet Take 1 tablet (1 mg total) by mouth every morning. 30 tablet 3   hydrALAZINE (APRESOLINE) 100 MG tablet Take 1 tablet (100 mg total) by mouth 3 (three) times daily. 90 tablet 0   hydrochlorothiazide (HYDRODIURIL) 25 MG tablet Take 1 tablet (25 mg total) by mouth daily. 30 tablet 1   hydrOXYzine (ATARAX) 50 MG tablet Take 1 tablet (50 mg total) by mouth every 8 (eight) hours as needed. 30 tablet 0   isosorbide mononitrate (IMDUR) 60 MG 24 hr tablet TAKE 1 TABLET BY MOUTH DAILY 90 tablet 3   levothyroxine (SYNTHROID) 150 MCG tablet Take 150 mcg by mouth See admin instructions. Take     levothyroxine (SYNTHROID) 25 MCG tablet Take 1 tablet (25 mcg total) by mouth daily. 30 tablet 2   lovastatin (MEVACOR) 20 MG tablet Take 20 mg by  mouth at bedtime.     Multiple Vitamin (MULTIVITAMIN) tablet Take 1 tablet by mouth every morning.      Multiple Vitamins-Minerals (HAIR SKIN NAILS PO) Take 5,000 mcg by mouth daily.     Oxycodone HCl 10 MG TABS Take 10 mg by mouth 4 (four) times daily as needed (pain).     OXYGEN Inhale 3 L into the lungs continuous.     pantoprazole (PROTONIX) 40 MG tablet Take 1 tablet (40 mg total) by mouth daily. 30 tablet 1   polyethylene glycol (MIRALAX / GLYCOLAX) packet Take 17 g by mouth daily as needed for mild constipation, moderate constipation or severe constipation.     sertraline (ZOLOFT) 100 MG tablet Take 100 mg by mouth at bedtime.     STIOLTO RESPIMAT 2.5-2.5 MCG/ACT AERS INHALE 2 PUFFS INTO THE LUNGS ONCE DAILY 4 g 5   triamcinolone cream (KENALOG) 0.1 % Apply topically 2 (two) times daily as  needed (eczema).     No current facility-administered medications for this visit.     Past Medical History:  Diagnosis Date   Anemia    Anxiety    Arthritis    Atrial fib/flutter, transient (HCC)    Cataract    COPD (chronic obstructive pulmonary disease) (HCC)    Depression    Diabetes mellitus    Diverticulosis    DJD (degenerative joint disease)    GERD (gastroesophageal reflux disease)    Gout    History of pneumonia    Hypertension    Hypothyroidism    Internal hemorrhoids     ROS:   All systems reviewed and negative except as noted in the HPI.   Past Surgical History:  Procedure Laterality Date   ABDOMINAL AORTOGRAM W/LOWER EXTREMITY N/A 07/24/2020   Procedure: ABDOMINAL AORTOGRAM W/LOWER EXTREMITY;  Surgeon: Nada Libman, MD;  Location: [facility];  Service: Cardiovascular;  Laterality: N/A;   CAROTID STENT  05/2021   CATARACT EXTRACTION W/ INTRAOCULAR LENS IMPLANT Left    ENDARTERECTOMY Left 09/09/2019   Procedure: LEFT ILIAC ENDARTERECTOMY;  Surgeon: Nada Libman, MD;  Location: [facility];  Service: Vascular;  Laterality: Left;   HERNIA REPAIR     INSERTION OF ILIAC STENT Left 09/09/2019   Procedure: INSERTION OF LEFT ILIAC STENT;  Surgeon: Nada Libman, MD;  Location: [facility];  Service: Vascular;  Laterality: Left;   PACEMAKER INSERTION     PERIPHERAL VASCULAR INTERVENTION Left 07/24/2020   Procedure: PERIPHERAL VASCULAR INTERVENTION;  Surgeon: Nada Libman, MD;  Location: [facility];  Service: Cardiovascular;  Laterality: Left;   PPM GENERATOR CHANGEOUT N/A 11/06/2022   Procedure: PPM GENERATOR CHANGEOUT;  Surgeon: Marinus Maw, MD;  Location: [facility];  Service: Cardiovascular;  Laterality: N/A;   THORACIC AORTIC ENDOVASCULAR STENT GRAFT N/A 09/09/2019   Procedure: THORACIC AORTIC ENDOVASCULAR STENT GRAFT WITH LEFT ILIAC CONDUIT;  Surgeon: Nada Libman, MD;  Location: [facility];  Service: Vascular;  Laterality: N/A;    TUBAL LIGATION     ULTRASOUND GUIDANCE FOR VASCULAR ACCESS Left 09/09/2019   Procedure: ULTRASOUND GUIDANCE FOR VASCULAR ACCESS;  Surgeon: Nada Libman, MD;  Location: [facility];  Service: Vascular;  Laterality: Left;   VAGINAL HYSTERECTOMY       Family History  Problem Relation Age of Onset   Prostate cancer Father    Hypertension Mother    Heart attack Mother    Heart disease Mother    Kidney disease Mother  28   Stomach cancer Son    Diabetes Maternal Aunt    CAD Maternal Aunt    Stroke Maternal Aunt    Heart disease Maternal Uncle    CAD Maternal Uncle    Stroke Maternal Uncle    Colon cancer Neg Hx    Esophageal cancer Neg Hx    Rectal cancer Neg Hx      Social History   Socioeconomic History   Marital status: Widowed    Spouse name: Not on file   Number of children: 2   Years of education: Not on file   Highest education level: Not on file  Occupational History   Occupation: Disabled    Employer: UNEMPLOYED  Tobacco Use   Smoking status: Former    Current packs/day: 2.00    Average packs/day: 2.0 packs/day for 54.0 years (108.0 ttl pk-yrs)    Types: Cigarettes    Passive exposure: Never   Smokeless tobacco: Never   Tobacco comments:    Pt quit smoking July 2024  Vaping Use   Vaping status: Never Used  Substance and Sexual Activity   Alcohol use: No   Drug use: No   Sexual activity: Yes    Birth control/protection: Surgical  Other Topics Concern   Not on file  Social History Narrative   Right handed    Has a son   Hostess at hotel    Social Drivers of Health   Financial Resource Strain: Low Risk  (06/24/2022)   Overall Financial Resource Strain (CARDIA)    Difficulty of Paying Living Expenses: Not very hard  Food Insecurity: No Food Insecurity (06/24/2022)   Hunger Vital Sign    Worried About Running Out of Food in the Last Year: Never true    Ran Out of Food in the Last Year: Never true  Transportation Needs: No Transportation Needs (06/24/2022)    PRAPARE - Administrator, Civil Service (Medical): No    Lack of Transportation (Non-Medical): No  Physical Activity: Inactive (10/09/2022)   Exercise Vital Sign    Days of Exercise per Week: 0 days    Minutes of Exercise per Session: 0 min  Stress: Stress Concern Present (11/28/2019)   [employer] of Occupational Health - Occupational Stress Questionnaire    Feeling of Stress : Rather much  Social Connections: Not on file  Intimate Partner Violence: Not At Risk (06/24/2022)   Humiliation, Afraid, Rape, and Kick questionnaire    Fear of Current or Ex-Partner: No    Emotionally Abused: No    Physically Abused: No    Sexually Abused: No     Ht 5\' 5"  (1.651 m)   Wt 190 lb 3.2 oz (86.3 kg)   BMI 31.65 kg/m   Physical Exam:  Well appearing NAD HEENT: Unremarkable Neck:  No JVD, no thyromegally Lymphatics:  No adenopathy Back:  No CVA tenderness Lungs:  Clear HEART:  Regular rate rhythm, no murmurs, no rubs, no clicks Abd:  soft, positive bowel sounds, no organomegally, no rebound, no guarding Ext:  2 plus pulses, no edema, no cyanosis, no clubbing Skin:  No rashes no nodules Neuro:  CN II through XII intact, motor grossly intact  EKG  DEVICE  Normal device function.  See PaceArt for details.   Assess/Plan:  sinus node dysfunction - she is asymptomatic s/p PPM insertion. HTN - she notes that at home her bp is much better controlled. I asked her to avoid salty foods. She has had some  sciatic pain as well. PAF - she has had less than 5 minutes of atrial fib at time. I suspect that she will have more and end up needing an OAC. However she has had none in the last 3 months. PPM - her [employer] DDD PM is working normally s/p PPM gen change out.   Sharlot Gowda Maikayla Beggs,MD

## 2023-02-18 NOTE — Patient Instructions (Signed)

## 2023-02-19 ENCOUNTER — Encounter: Payer: Self-pay | Admitting: Cardiology

## 2023-02-19 ENCOUNTER — Ambulatory Visit: Payer: 59 | Attending: Cardiology | Admitting: Cardiology

## 2023-02-19 VITALS — BP 142/79 | HR 75 | Wt 192.2 lb

## 2023-02-19 DIAGNOSIS — I10 Essential (primary) hypertension: Secondary | ICD-10-CM | POA: Diagnosis not present

## 2023-02-19 DIAGNOSIS — I495 Sick sinus syndrome: Secondary | ICD-10-CM

## 2023-02-19 DIAGNOSIS — I5032 Chronic diastolic (congestive) heart failure: Secondary | ICD-10-CM

## 2023-02-19 NOTE — Patient Instructions (Addendum)
Medication Changes:  No Changes In Medications at this time.   Lab Work:  Go DOWN to LOWER LEVEL (LL) to have your blood work completed inside of [employer] office.  We will only call you if the results are abnormal or if the provider would like to make medication changes.  Follow-Up in: 2 MONTHS AS SCHEDULED   If you have any questions or concerns before your next appointment please send [place] a message through mychart or call our office at [phone] Monday-Friday 8 am-5 pm.   If you have an urgent need after hours on the weekend please call your Primary Cardiologist or the [facility] in [place] at [phone].  At the [facility], you and your health needs are our priority. We have a designated team specialized in the treatment of Heart Failure. This Care Team includes your primary Heart Failure Specialized Cardiologist (physician), Advanced Practice Providers (APPs- Physician Assistants and Nurse Practitioners), and Pharmacist who all work together to provide you with the care you need, when you need it.   You may see any of the following providers on your designated Care Team at your next follow up:  Dr. Arvilla Meres Dr. Marca Ancona Dr. Dorthula Nettles Dr. Theresia Bough Tonye Becket, NP Robbie Lis, [place] [address] [place], [place] Brynda Peon, NP [place] Lee, NP Clarisa Kindred, NP Enos Fling, PharmD

## 2023-02-19 NOTE — Progress Notes (Signed)
ADVANCED HE[facility] FAILURE CLINIC NOTE  Referring Physician: [name], MD  Primary Care: [name], MD Primary Cardiologist: Chilton Si  CC: HFpEF  HPI: [name] is a 73 y.o. 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  She was admitted to [facility] in 6/24 with hypertensive crisis and pulmonary edema requiring BiPAP and IV Lasix.  EF during admission 60 to 65% with moderate to severe LVH.  Since that time she has been seen in [facility] clinic. She has now also undergone extensive amyloid evaluation which has been negative.   Interval history - Reports that overall she feels much better; no longer having issues with uncontrolled HTN & lower extremity edema.  - She did not take her meds this AM.   Current Outpatient Medications  Medication Sig Dispense Refill   aspirin 81 MG tablet Take 81 mg by mouth every morning.      carvedilol (COREG) 25 MG tablet Take 1 tablet (25 mg total) by mouth 2 (two) times daily with a meal. 180 tablet 3   Cholecalciferol (VITAMIN D) 50 MCG (2000 UT) CAPS Take 2,000 Units by mouth every morning.     cloNIDine (CATAPRES - DOSED IN MG/24 HR) 0.3 mg/24hr patch Place 1 patch (0.3 mg total) onto the skin once a week. 4 patch 12   clopidogrel (PLAVIX) 75 MG tablet Take 75 mg by mouth daily.     clotrimazole-betamethasone (LOTRISONE) cream Apply 1 Application topically daily as needed (Rash).     doxazosin (CARDURA) 4 MG tablet Take 1 tablet (4 mg total) by mouth daily. 30 tablet 3   empagliflozin (JARDIANCE) 10 MG TABS tablet Take 1 tablet (10 mg total) by mouth daily before breakfast. 30 tablet 3   famotidine  (PEPCID) 20 MG tablet Take 20 mg by mouth daily.     furosemide (LASIX) 20 MG tablet Take 3 tablets (60 mg total) by mouth daily. 270 tablet 3   gabapentin (NEURONTIN) 100 MG capsule Take 1 capsule (100 mg total) by mouth 3 (three) times daily. 90 capsule 2   glimepiride (AMARYL) 1 MG tablet Take 1 tablet (1 mg total) by mouth every morning. 30 tablet 3   hydrALAZINE (APRESOLINE) 100 MG tablet Take 1 tablet (100 mg total) by mouth 3 (three) times daily. 90 tablet 0   hydrochlorothiazide (HYDRODIURIL) 25 MG tablet Take 1 tablet (25 mg total) by mouth daily. 30 tablet 1   hydrOXYzine (ATARAX) 50 MG tablet Take 1 tablet (50 mg total) by mouth every 8 (eight) hours as needed. 30 tablet 0   isosorbide mononitrate (IMDUR) 60 MG 24 hr tablet TAKE 1 TABLET BY MOUTH DAILY 90 tablet 3   levothyroxine (SYNTHROID) 150 MCG tablet Take 150 mcg by mouth See admin instructions. Take     levothyroxine (SYNTHROID) 25 MCG tablet Take 1 tablet (25 mcg total) by mouth daily. 30 tablet 2   lovastatin (MEVACOR) 20 MG tablet Take 20 mg by mouth at bedtime.     Multiple Vitamin (MULTIVITAMIN) tablet Take 1 tablet by mouth every morning.      Multiple Vitamins-Minerals (HAIR SKIN NAILS PO) Take 5,000 mcg by mouth daily.     Oxycodone HCl 10 MG TABS  Take 10 mg by mouth 4 (four) times daily as needed (pain).     OXYGEN Inhale 3 L into the lungs continuous.     pantoprazole (PROTONIX) 40 MG tablet Take 1 tablet (40 mg total) by mouth daily. 30 tablet 1   polyethylene glycol (MIRALAX / GLYCOLAX) packet Take 17 g by mouth daily as needed for mild constipation, moderate constipation or severe constipation.     sertraline (ZOLOFT) 100 MG tablet Take 100 mg by mouth at bedtime.     STIOLTO RESPIMAT 2.5-2.5 MCG/ACT AERS INHALE 2 PUFFS INTO THE LUNGS ONCE DAILY 4 g 5   triamcinolone cream (KENALOG) 0.1 % Apply topically 2 (two) times daily as needed (eczema).     No current facility-administered medications for this visit.       PHYSICAL EXAM: Vitals:   02/19/23 1027  BP: (!) 142/79  Pulse: 75  SpO2: (S) 93%   GENERAL: NAD Lungs- decreased breath sounds CARDIAC:  JVP: 9 cm          Normal rate with regular rhythm. No murmur.  Pulses 2+. No edema.  ABDOMEN: Soft, non-tender, non-distended.  EXTREMITIES: Warm and well perfused.  NEUROLOGIC: No obvious FND   DATA REVIEW  ECG: 07/11/22: atrial paced, LVH as per my personal interpretation  ECHO: 06/23/22: Normal LVEF, severe LVH as per my personal interpretation  ASSESSMENT & PLAN:  Heart failure with preserved ejection fraction -Severe LVH by echocardiogram likely secondary to uncontrolled HTN. - Myeloma panel unremarkable on 07/11/22.  - PYP scan on 02/05/23 personally reviewed, negative. Grade 0 uptake. No further work up required.  -Spironolactone 25 mg daily -Hydralazine 100 mg 3 times daily with Imdur 60 mg -Continue Jardiance 10 mg.  -Euvolemic on exam today.  -Repeat BMP/BNP today  2.  Uncontrolled Hypertension -Longstanding history of uncontrolled hypertension, moderate left renal artery stenosis on CTA from June 2024. -Referred to hypertension clinic.  - No renal artery stenosis by dopplers from 09/19/22 - She did not take BP meds this AM, despite this, her blood pressure much better than it has been historically.  - Continue coreg 25mg  BID, hydralazine, imdur 60; hopeful to wean off clonidine patch. I suspect she does not really have an allergy to amlodipine. Will defer to hypertension clinic.   3.Chronic respiratory failure/COPD -On chronic O2 at night and with activity - PFTs from 08/27/22 with FVC of 1.89L (61%), FEV1 1.42 (61%), FEV/FVC 99% predicted, DLCO 36% predicted. -Reports that dyspnea has improved since diuresis. - Saw Dr. Judeth Horn on 02/09/23, reviewed notes, will continue Stiolto. No concern for ILD.    4. DM II -Last A1c was 7.0 -continue jardiance 10mg  daily.    5. 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 Sinus node dysfunction s/p PPM Afib - Reviewed device interrogation today; AT/AF<0.1%.    7. 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  8. Tobacco use - Reports that she has still not smoked for roughly 2 months now.    Tamaira Ciriello Advanced Heart Failure Mechanical Circulatory Support

## 2023-02-20 LAB — BASIC METABOLIC PANEL
BUN/Creatinine Ratio: 13 (ref 12–28)
BUN: 32 mg/dL — ABNORMAL HIGH (ref 8–27)
CO2: 28 mmol/L (ref 20–29)
Calcium: 9 mg/dL (ref 8.7–10.3)
Chloride: 102 mmol/L (ref 96–106)
Creatinine, Ser: 2.42 mg/dL — ABNORMAL HIGH (ref 0.57–1.00)
Glucose: 77 mg/dL (ref 70–99)
Potassium: 3.6 mmol/L (ref 3.5–5.2)
Sodium: 146 mmol/L — ABNORMAL HIGH (ref 134–144)
eGFR: 21 mL/min/{1.73_m2} — ABNORMAL LOW (ref >59)

## 2023-02-20 LAB — BRAIN NATRIURETIC PEPTIDE: BNP: 311.1 pg/mL — ABNORMAL HIGH (ref 0.0–100.0)

## 2023-02-25 ENCOUNTER — Telehealth (HOSPITAL_COMMUNITY): Payer: Self-pay | Admitting: Cardiology

## 2023-02-25 DIAGNOSIS — I5032 Chronic diastolic (congestive) heart failure: Secondary | ICD-10-CM

## 2023-02-25 NOTE — Telephone Encounter (Signed)
Pt aware.

## 2023-02-25 NOTE — Telephone Encounter (Signed)
-----   Message from [facility] sent at 02/20/2023  4:47 PM EST ----- Can we ask her to hold diuretics for the next 3 days. Repeat BMP next week.   Adi

## 2023-03-02 ENCOUNTER — Other Ambulatory Visit
Admission: RE | Admit: 2023-03-02 | Discharge: 2023-03-02 | Disposition: A | Discharge status: Home / Self Care | Payer: 59 | Attending: Cardiology | Admitting: Cardiology

## 2023-03-02 DIAGNOSIS — I5032 Chronic diastolic (congestive) heart failure: Secondary | ICD-10-CM | POA: Diagnosis present

## 2023-03-02 LAB — BASIC METABOLIC PANEL
Anion gap: 12 (ref 5–15)
BUN: 34 mg/dL — ABNORMAL HIGH (ref 8–23)
CO2: 31 mmol/L (ref 22–32)
Calcium: 9.1 mg/dL (ref 8.9–10.3)
Chloride: 98 mmol/L (ref 98–111)
Creatinine, Ser: 1.94 mg/dL — ABNORMAL HIGH (ref 0.44–1.00)
GFR, Estimated: 27 mL/min — ABNORMAL LOW (ref >60)
Glucose, Bld: 143 mg/dL — ABNORMAL HIGH (ref 70–99)
Potassium: 3.2 mmol/L — ABNORMAL LOW (ref 3.5–5.1)
Sodium: 141 mmol/L (ref 135–145)

## 2023-03-02 NOTE — Addendum Note (Signed)
 Addended by: Aalayah Riles C on: 03/02/2023 09:30 AM   Modules accepted: Orders

## 2023-03-11 ENCOUNTER — Other Ambulatory Visit (HOSPITAL_COMMUNITY): Payer: Self-pay | Admitting: Cardiology

## 2023-03-13 ENCOUNTER — Encounter (HOSPITAL_COMMUNITY): Payer: 59 | Admitting: Cardiology

## 2023-03-13 ENCOUNTER — Other Ambulatory Visit: Payer: Self-pay | Admitting: Internal Medicine

## 2023-03-13 DIAGNOSIS — Z1231 Encounter for screening mammogram for malignant neoplasm of breast: Secondary | ICD-10-CM

## 2023-03-13 NOTE — Progress Notes (Signed)
 Remote pacemaker transmission.

## 2023-03-18 ENCOUNTER — Telehealth (HOSPITAL_COMMUNITY): Payer: Self-pay | Admitting: Cardiology

## 2023-03-18 NOTE — Telephone Encounter (Signed)
 Patient called to report medication side effects.  Reports clonidine caused burning itching pains all over body, felt sick. -denies redness,welts, swelling,fever  Reports she took patch off and restarted tablets and symptoms have resolved  On patches x 2 months symptoms started 3 days ago.   Ok to change back to tablets

## 2023-03-19 MED ORDER — CLONIDINE HCL 0.3 MG PO TABS: 0.3000 mg | ORAL_TABLET | Freq: Three times a day (TID) | ORAL | 11 refills | Status: AC

## 2023-03-19 NOTE — Telephone Encounter (Signed)
 Confirmed dosage with HF PharmD Stop patches Restart 0.3 TID of clonidine med list updated

## 2023-04-17 ENCOUNTER — Encounter: Payer: 59 | Admitting: Cardiology

## 2023-04-21 ENCOUNTER — Other Ambulatory Visit (HOSPITAL_COMMUNITY): Payer: Self-pay | Admitting: Cardiology

## 2023-04-22 ENCOUNTER — Telehealth: Payer: Self-pay

## 2023-04-22 NOTE — Telephone Encounter (Signed)
 Copied from CRM [id number]. Topic: General - Other >> Apr 22, 2023 11:34 AM Theodis Sato wrote: Reason for CRM: Patient is requesting a phone call back from Dr. Jodene Nam nurse but declined to provide details but states about a test for her CPAP. Please call patient back on her home phone.  Spoke to patient. She is requesting update on POC. According to 02/09/2023 office note, patient did not qualify for POC.  She is not calling in reference to CPAP.  Pt is aware and voiced his understanding.  Nothing further needed.

## 2023-04-23 ENCOUNTER — Other Ambulatory Visit: Payer: Self-pay | Admitting: Pulmonary Disease

## 2023-04-23 ENCOUNTER — Ambulatory Visit
Admission: RE | Admit: 2023-04-23 | Discharge: 2023-04-23 | Disposition: A | Discharge status: Home / Self Care | Payer: 59 | Source: Ambulatory Visit | Attending: Internal Medicine | Admitting: Internal Medicine

## 2023-04-23 DIAGNOSIS — Z1231 Encounter for screening mammogram for malignant neoplasm of breast: Secondary | ICD-10-CM

## 2023-04-29 ENCOUNTER — Telehealth: Payer: Self-pay | Admitting: Internal Medicine

## 2023-04-29 NOTE — Telephone Encounter (Signed)
 Informed patient that as long as she stays atleast 6-12 inches from the airfryer when it is running, she will be fine.  Patient verbalizes understanding.

## 2023-04-29 NOTE — Telephone Encounter (Signed)
 Patient stated she recently bought an air fryer and the instructions stated patient should check with her cardiologist regarding if this would be safe for her to use as she has a pacemaker.

## 2023-05-06 ENCOUNTER — Telehealth (HOSPITAL_COMMUNITY): Payer: Self-pay | Admitting: Cardiology

## 2023-05-06 NOTE — Progress Notes (Signed)
 ADVANCED HEART FAILURE CLINIC NOTE  Referring Physician: [name], MD  Primary Care: [name], MD Primary Cardiologist: Chilton Si  CC: [id number]  HPI: [name] is a 73 y.o. 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  She was admitted to [facility] in 6/24 with hypertensive crisis and pulmonary edema requiring BiPAP and IV Lasix.  EF during admission 60 to 65% with moderate to severe LVH.  Since that time she has been seen in [facility] clinic. She has now also undergone extensive amyloid evaluation which has been negative.   Interval history - Reports that overall she feels much better; no longer having issues with uncontrolled HTN & lower extremity edema.  - She did not take her meds this AM.   Current Outpatient Medications  Medication Sig Dispense Refill   aspirin 81 MG tablet Take 81 mg by mouth every morning.      carvedilol (COREG) 25 MG tablet Take 1 tablet (25 mg total) by mouth 2 (two) times daily with a meal. 180 tablet 3   Cholecalciferol (VITAMIN D) 50 MCG (2000 UT) CAPS Take 2,000 Units by mouth every morning.     cloNIDine (CATAPRES) 0.3 MG tablet Take 1 tablet (0.3 mg total) by mouth 3 (three) times daily. 90 tablet 11   clopidogrel (PLAVIX) 75 MG tablet Take 75 mg by mouth daily.     clotrimazole-betamethasone (LOTRISONE) cream Apply 1 Application topically daily as needed (Rash).     doxazosin (CARDURA) 4 MG tablet TAKE 1 TABLET BY MOUTH DAILY 100 tablet 2   famotidine (PEPCID) 20 MG tablet Take 20 mg by mouth daily.     furosemide (LASIX) 20 MG tablet Take 3 tablets (60 mg total) by mouth daily. 270 tablet 3   gabapentin (NEURONTIN) 100  MG capsule Take 1 capsule (100 mg total) by mouth 3 (three) times daily. 90 capsule 2   glimepiride (AMARYL) 1 MG tablet Take 1 tablet (1 mg total) by mouth every morning. 30 tablet 3   hydrALAZINE (APRESOLINE) 100 MG tablet Take 1 tablet (100 mg total) by mouth 3 (three) times daily. 90 tablet 0   hydrochlorothiazide (HYDRODIURIL) 25 MG tablet Take 1 tablet (25 mg total) by mouth daily. 30 tablet 1   hydrOXYzine (ATARAX) 50 MG tablet Take 1 tablet (50 mg total) by mouth every 8 (eight) hours as needed. 30 tablet 0   isosorbide mononitrate (IMDUR) 60 MG 24 hr tablet TAKE 1 TABLET BY MOUTH DAILY 90 tablet 3   JARDIANCE 10 MG TABS tablet TAKE ONE TABLET BY MOUTH DAILY 30 tablet 3   levothyroxine (SYNTHROID) 150 MCG tablet Take 150 mcg by mouth See admin instructions. Take     levothyroxine (SYNTHROID) 25 MCG tablet Take 1 tablet (25 mcg total) by mouth daily. 30 tablet 2   lovastatin (MEVACOR) 20 MG tablet Take 20 mg by mouth at bedtime.     Multiple Vitamin (MULTIVITAMIN) tablet Take 1 tablet by mouth every morning.      Multiple Vitamins-Minerals (HAIR SKIN NAILS PO) Take 5,000 mcg by mouth daily.     Oxycodone HCl 10 MG TABS Take 10 mg by mouth 4 (four) times daily as needed (pain).  OXYGEN Inhale 3 L into the lungs continuous.     pantoprazole (PROTONIX) 40 MG tablet Take 1 tablet (40 mg total) by mouth daily. 30 tablet 1   polyethylene glycol (MIRALAX / GLYCOLAX) packet Take 17 g by mouth daily as needed for mild constipation, moderate constipation or severe constipation.     sertraline (ZOLOFT) 100 MG tablet Take 100 mg by mouth at bedtime.     STIOLTO RESPIMAT 2.5-2.5 MCG/ACT AERS INHALE 2 PUFFS INTO THE LUNGS ONCE DAILY 4 g 5   triamcinolone cream (KENALOG) 0.1 % Apply topically 2 (two) times daily as needed (eczema).     No current facility-administered medications for this visit.      PHYSICAL EXAM: There were no vitals filed for this visit. GENERAL: NAD Lungs- *** CARDIAC:   JVP: *** cm          Normal rate with regular rhythm. *** murmur.  Pulses ***. *** edema.  ABDOMEN: Soft, non-tender, non-distended.  EXTREMITIES: Warm and well perfused.  NEUROLOGIC: No obvious FND    DATA REVIEW  ECG: 07/11/22: atrial paced, LVH as per my personal interpretation  ECHO: 06/23/22: Normal LVEF, severe LVH as per my personal interpretation  ASSESSMENT & PLAN:  Heart failure with preserved ejection fraction -Severe LVH by echocardiogram likely secondary to uncontrolled HTN. - Myeloma panel unremarkable on 07/11/22.  - PYP scan on 02/05/23 personally reviewed, negative. Grade 0 uptake. No further work up required.  -Spironolactone 25 mg daily -Hydralazine 100 mg 3 times daily with Imdur 60 mg -Continue Jardiance 10 mg.  -Euvolemic on exam today.  -Repeat BMP/BNP today  2.  Uncontrolled Hypertension -Longstanding history of uncontrolled hypertension, moderate left renal artery stenosis on CTA from June 2024. -Referred to hypertension clinic.  - No renal artery stenosis by dopplers from 09/19/22 - She did not take BP meds this AM, despite this, her blood pressure much better than it has been historically.  - Continue coreg 25mg  BID, hydralazine, imdur 60; hopeful to wean off clonidine patch. I suspect she does not really have an allergy to amlodipine. Will defer to hypertension clinic.   3.Chronic respiratory failure/COPD -On chronic O2 at night and with activity - PFTs from 08/27/22 with FVC of 1.89L (61%), FEV1 1.42 (61%), FEV/FVC 99% predicted, DLCO 36% predicted. -Reports that dyspnea has improved since diuresis. - Saw Dr. Marygrace Snellen on 02/09/23, reviewed notes, will continue Stiolto. No concern for ILD.    4. DM II -Last A1c was 7.0 -continue jardiance 10mg  daily.    5. 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 Sinus node dysfunction s/p PPM Afib - Reviewed device interrogation today; AT/AF<0.1%.    7. 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  8. Tobacco use - Reports that she has still not smoked for roughly 2 months now.    [name] Advanced Heart Failure Mechanical Circulatory Support

## 2023-05-06 NOTE — Telephone Encounter (Signed)
 Called to confirm/remind patient of their appointment at the Advanced Heart Failure Clinic on 05/06/2023.   Appointment:   [] Confirmed  [x] Left mess   [] No answer/No voice mail  [] Phone not in service  Patient reminded to bring all medications and/or complete list.  Confirmed patient has transportation. Gave directions, instructed to utilize valet parking.

## 2023-05-07 ENCOUNTER — Encounter (HOSPITAL_COMMUNITY): Payer: Self-pay | Admitting: Cardiology

## 2023-05-07 ENCOUNTER — Ambulatory Visit (HOSPITAL_COMMUNITY)
Admission: RE | Admit: 2023-05-07 | Discharge: 2023-05-07 | Disposition: A | Discharge status: Home / Self Care | Source: Ambulatory Visit | Attending: Cardiology | Admitting: Cardiology

## 2023-05-07 VITALS — BP 118/72 | HR 80 | Ht 65.0 in | Wt 184.0 lb

## 2023-05-07 DIAGNOSIS — I4891 Unspecified atrial fibrillation: Secondary | ICD-10-CM | POA: Diagnosis not present

## 2023-05-07 DIAGNOSIS — I130 Hypertensive heart and chronic kidney disease with heart failure and stage 1 through stage 4 chronic kidney disease, or unspecified chronic kidney disease: Secondary | ICD-10-CM | POA: Diagnosis not present

## 2023-05-07 DIAGNOSIS — N1832 Chronic kidney disease, stage 3b: Secondary | ICD-10-CM | POA: Insufficient documentation

## 2023-05-07 DIAGNOSIS — I495 Sick sinus syndrome: Secondary | ICD-10-CM | POA: Diagnosis not present

## 2023-05-07 DIAGNOSIS — N184 Chronic kidney disease, stage 4 (severe): Secondary | ICD-10-CM

## 2023-05-07 DIAGNOSIS — G4736 Sleep related hypoventilation in conditions classified elsewhere: Secondary | ICD-10-CM | POA: Diagnosis not present

## 2023-05-07 DIAGNOSIS — I10 Essential (primary) hypertension: Secondary | ICD-10-CM

## 2023-05-07 DIAGNOSIS — Z86718 Personal history of other venous thrombosis and embolism: Secondary | ICD-10-CM | POA: Diagnosis not present

## 2023-05-07 DIAGNOSIS — I5032 Chronic diastolic (congestive) heart failure: Secondary | ICD-10-CM | POA: Diagnosis present

## 2023-05-07 DIAGNOSIS — Z87891 Personal history of nicotine dependence: Secondary | ICD-10-CM | POA: Insufficient documentation

## 2023-05-07 DIAGNOSIS — Z7984 Long term (current) use of oral hypoglycemic drugs: Secondary | ICD-10-CM | POA: Diagnosis not present

## 2023-05-07 DIAGNOSIS — Z7902 Long term (current) use of antithrombotics/antiplatelets: Secondary | ICD-10-CM | POA: Diagnosis not present

## 2023-05-07 DIAGNOSIS — E1151 Type 2 diabetes mellitus with diabetic peripheral angiopathy without gangrene: Secondary | ICD-10-CM | POA: Insufficient documentation

## 2023-05-07 DIAGNOSIS — I97638 Postprocedural hematoma of a circulatory system organ or structure following other circulatory system procedure: Secondary | ICD-10-CM | POA: Diagnosis not present

## 2023-05-07 DIAGNOSIS — I472 Ventricular tachycardia, unspecified: Secondary | ICD-10-CM | POA: Insufficient documentation

## 2023-05-07 DIAGNOSIS — J9610 Chronic respiratory failure, unspecified whether with hypoxia or hypercapnia: Secondary | ICD-10-CM | POA: Insufficient documentation

## 2023-05-07 DIAGNOSIS — M7918 Myalgia, other site: Secondary | ICD-10-CM | POA: Insufficient documentation

## 2023-05-07 DIAGNOSIS — E1122 Type 2 diabetes mellitus with diabetic chronic kidney disease: Secondary | ICD-10-CM | POA: Diagnosis not present

## 2023-05-07 DIAGNOSIS — J9611 Chronic respiratory failure with hypoxia: Secondary | ICD-10-CM

## 2023-05-07 DIAGNOSIS — J449 Chronic obstructive pulmonary disease, unspecified: Secondary | ICD-10-CM | POA: Diagnosis not present

## 2023-05-07 DIAGNOSIS — Z95820 Peripheral vascular angioplasty status with implants and grafts: Secondary | ICD-10-CM | POA: Insufficient documentation

## 2023-05-07 DIAGNOSIS — Z9981 Dependence on supplemental oxygen: Secondary | ICD-10-CM | POA: Diagnosis not present

## 2023-05-07 DIAGNOSIS — Z7982 Long term (current) use of aspirin: Secondary | ICD-10-CM | POA: Diagnosis not present

## 2023-05-07 DIAGNOSIS — Z716 Tobacco abuse counseling: Secondary | ICD-10-CM | POA: Insufficient documentation

## 2023-05-07 DIAGNOSIS — Z950 Presence of cardiac pacemaker: Secondary | ICD-10-CM | POA: Insufficient documentation

## 2023-05-07 DIAGNOSIS — Z79899 Other long term (current) drug therapy: Secondary | ICD-10-CM | POA: Insufficient documentation

## 2023-05-07 LAB — BASIC METABOLIC PANEL WITH GFR
Anion gap: 13 (ref 5–15)
BUN: 58 mg/dL — ABNORMAL HIGH (ref 8–23)
CO2: 32 mmol/L (ref 22–32)
Calcium: 9 mg/dL (ref 8.9–10.3)
Chloride: 94 mmol/L — ABNORMAL LOW (ref 98–111)
Creatinine, Ser: 3.01 mg/dL — ABNORMAL HIGH (ref 0.44–1.00)
GFR, Estimated: 16 mL/min — ABNORMAL LOW (ref >60)
Glucose, Bld: 252 mg/dL — ABNORMAL HIGH (ref 70–99)
Potassium: 3.4 mmol/L — ABNORMAL LOW (ref 3.5–5.1)
Sodium: 139 mmol/L (ref 135–145)

## 2023-05-07 LAB — BRAIN NATRIURETIC PEPTIDE: B Natriuretic Peptide: 191.5 pg/mL — ABNORMAL HIGH (ref 0.0–100.0)

## 2023-05-07 NOTE — Patient Instructions (Signed)
 Medication Changes:  No Changes In Medications at this time.   Lab Work:  Labs done today, your results will be available in MyChart, we will contact you for abnormal readings.  Follow-Up in: 1 month with pharmacy as scheduled   Then again in 2 months with Dr. Bruce Caper as scheduled   At the Advanced Heart Failure Clinic, you and your health needs are our priority. We have a designated team specialized in the treatment of Heart Failure. This Care Team includes your primary Heart Failure Specialized Cardiologist (physician), Advanced Practice Providers (APPs- Physician Assistants and Nurse Practitioners), and Pharmacist who all work together to provide you with the care you need, when you need it.   You may see any of the following providers on your designated Care Team at your next follow up:  Dr. Jules Oar Dr. Peder Bourdon Dr. Alwin Baars Dr. Judyth Nunnery Nieves Bars, NP Ruddy Corral, [place] [facility] [place], [place] Dennise Fitz, NP [place] Lee, NP Luster Salters, PharmD   Please be sure to bring in all your medications bottles to every appointment.   Need to Contact Us :  If you have any questions or concerns before your next appointment please send us  a message through [place] or call our office at [phone].    TO LEAVE A MESSAGE FOR THE NURSE SELECT OPTION 2, PLEASE LEAVE A MESSAGE INCLUDING: YOUR NAME DATE OF BIRTH CALL BACK NUMBER REASON FOR CALL**this is important as we prioritize the call backs  YOU WILL RECEIVE A CALL BACK THE SAME DAY AS LONG AS YOU CALL BEFORE 4:00 PM

## 2023-05-08 ENCOUNTER — Telehealth (HOSPITAL_COMMUNITY): Payer: Self-pay | Admitting: *Deleted

## 2023-05-08 ENCOUNTER — Ambulatory Visit (INDEPENDENT_AMBULATORY_CARE_PROVIDER_SITE_OTHER): Payer: 59

## 2023-05-08 DIAGNOSIS — I495 Sick sinus syndrome: Secondary | ICD-10-CM | POA: Diagnosis not present

## 2023-05-08 DIAGNOSIS — I5032 Chronic diastolic (congestive) heart failure: Secondary | ICD-10-CM

## 2023-05-08 DIAGNOSIS — I482 Chronic atrial fibrillation, unspecified: Secondary | ICD-10-CM

## 2023-05-08 MED ORDER — FUROSEMIDE 20 MG PO TABS: 20.0000 mg | ORAL_TABLET | Freq: Every day | ORAL | 3 refills | Status: AC

## 2023-05-08 NOTE — Telephone Encounter (Signed)
 Called patient per Dr. Gardenia with following lab results and instructions:  Her creatinine is significantly elevated now.  Can we have her hold Lasix  for the next 2 days and then restart at 20 mg daily with repeat labs next week.  Pt verbalized understanding of above, repeat lab ordered and scheduled.

## 2023-05-10 LAB — CUP PACEART REMOTE DEVICE CHECK
Battery Remaining Longevity: 152 mo
Battery Voltage: 3.17 V
Brady Statistic AP VP Percent: 2.77 %
Brady Statistic AP VS Percent: 90.81 %
Brady Statistic AS VP Percent: 0.22 %
Brady Statistic AS VS Percent: 6.2 %
Brady Statistic RA Percent Paced: 93.53 %
Brady Statistic RV Percent Paced: 3 %
Date Time Interrogation Session: 20250417091242
Implantable Lead Connection Status: 753985
Implantable Lead Connection Status: 753985
Implantable Lead Implant Date: 19970929
Implantable Lead Implant Date: 19970929
Implantable Lead Location: 753859
Implantable Lead Location: 753860
Implantable Lead Model: 4068
Implantable Lead Model: 5092
Implantable Pulse Generator Implant Date: 20241017
Lead Channel Impedance Value: 361 Ohm
Lead Channel Impedance Value: 361 Ohm
Lead Channel Impedance Value: 399 Ohm
Lead Channel Impedance Value: 437 Ohm
Lead Channel Pacing Threshold Amplitude: 0.875 V
Lead Channel Pacing Threshold Amplitude: 1.125 V
Lead Channel Pacing Threshold Pulse Width: 0.4 ms
Lead Channel Pacing Threshold Pulse Width: 0.4 ms
Lead Channel Sensing Intrinsic Amplitude: 0.5 mV
Lead Channel Sensing Intrinsic Amplitude: 0.5 mV
Lead Channel Sensing Intrinsic Amplitude: 11.75 mV
Lead Channel Sensing Intrinsic Amplitude: 11.75 mV
Lead Channel Setting Pacing Amplitude: 1.75 V
Lead Channel Setting Pacing Amplitude: 2 V
Lead Channel Setting Pacing Pulse Width: 0.4 ms
Lead Channel Setting Sensing Sensitivity: 1.2 mV
Zone Setting Status: 755011

## 2023-05-13 ENCOUNTER — Ambulatory Visit (HOSPITAL_COMMUNITY)
Admission: RE | Admit: 2023-05-13 | Discharge: 2023-05-13 | Disposition: A | Discharge status: Home / Self Care | Source: Ambulatory Visit | Attending: Cardiology | Admitting: Cardiology

## 2023-05-13 DIAGNOSIS — I482 Chronic atrial fibrillation, unspecified: Secondary | ICD-10-CM | POA: Diagnosis not present

## 2023-05-13 DIAGNOSIS — I5032 Chronic diastolic (congestive) heart failure: Secondary | ICD-10-CM | POA: Insufficient documentation

## 2023-05-13 LAB — BASIC METABOLIC PANEL WITH GFR
Anion gap: 11 (ref 5–15)
BUN: 50 mg/dL — ABNORMAL HIGH (ref 8–23)
CO2: 30 mmol/L (ref 22–32)
Calcium: 9.4 mg/dL (ref 8.9–10.3)
Chloride: 98 mmol/L (ref 98–111)
Creatinine, Ser: 2.8 mg/dL — ABNORMAL HIGH (ref 0.44–1.00)
GFR, Estimated: 17 mL/min — ABNORMAL LOW (ref >60)
Glucose, Bld: 147 mg/dL — ABNORMAL HIGH (ref 70–99)
Potassium: 3.3 mmol/L — ABNORMAL LOW (ref 3.5–5.1)
Sodium: 139 mmol/L (ref 135–145)

## 2023-05-14 ENCOUNTER — Other Ambulatory Visit (HOSPITAL_COMMUNITY)

## 2023-05-17 ENCOUNTER — Encounter (HOSPITAL_COMMUNITY): Payer: Self-pay

## 2023-05-17 ENCOUNTER — Emergency Department (HOSPITAL_COMMUNITY)

## 2023-05-17 ENCOUNTER — Emergency Department (HOSPITAL_COMMUNITY)
Admission: EM | Admit: 2023-05-17 | Discharge: 2023-05-17 | Disposition: A | Discharge status: Home / Self Care | Attending: Emergency Medicine | Admitting: Emergency Medicine

## 2023-05-17 ENCOUNTER — Other Ambulatory Visit: Payer: Self-pay

## 2023-05-17 DIAGNOSIS — R42 Dizziness and giddiness: Secondary | ICD-10-CM | POA: Diagnosis not present

## 2023-05-17 DIAGNOSIS — Z7902 Long term (current) use of antithrombotics/antiplatelets: Secondary | ICD-10-CM | POA: Insufficient documentation

## 2023-05-17 DIAGNOSIS — W1830XA Fall on same level, unspecified, initial encounter: Secondary | ICD-10-CM | POA: Insufficient documentation

## 2023-05-17 DIAGNOSIS — S93601A Unspecified sprain of right foot, initial encounter: Secondary | ICD-10-CM

## 2023-05-17 DIAGNOSIS — E876 Hypokalemia: Secondary | ICD-10-CM | POA: Insufficient documentation

## 2023-05-17 DIAGNOSIS — Z7982 Long term (current) use of aspirin: Secondary | ICD-10-CM | POA: Insufficient documentation

## 2023-05-17 DIAGNOSIS — M542 Cervicalgia: Secondary | ICD-10-CM | POA: Insufficient documentation

## 2023-05-17 DIAGNOSIS — M25571 Pain in right ankle and joints of right foot: Secondary | ICD-10-CM | POA: Diagnosis not present

## 2023-05-17 DIAGNOSIS — I159 Secondary hypertension, unspecified: Secondary | ICD-10-CM

## 2023-05-17 LAB — CBC WITH DIFFERENTIAL/PLATELET
Abs Immature Granulocytes: 0.04 10*3/uL (ref 0.00–0.07)
Basophils Absolute: 0 10*3/uL (ref 0.0–0.1)
Basophils Relative: 1 %
Eosinophils Absolute: 0.3 10*3/uL (ref 0.0–0.5)
Eosinophils Relative: 3 %
HCT: 37.9 % (ref 36.0–46.0)
Hemoglobin: 12.1 g/dL (ref 12.0–15.0)
Immature Granulocytes: 1 %
Lymphocytes Relative: 15 %
Lymphs Abs: 1.2 10*3/uL (ref 0.7–4.0)
MCH: 28.5 pg (ref 26.0–34.0)
MCHC: 31.9 g/dL (ref 30.0–36.0)
MCV: 89.2 fL (ref 80.0–100.0)
Monocytes Absolute: 0.7 10*3/uL (ref 0.1–1.0)
Monocytes Relative: 9 %
Neutro Abs: 5.7 10*3/uL (ref 1.7–7.7)
Neutrophils Relative %: 71 %
Platelets: 174 10*3/uL (ref 150–400)
RBC: 4.25 MIL/uL (ref 3.87–5.11)
RDW: 21.3 % — ABNORMAL HIGH (ref 11.5–15.5)
WBC: 7.9 10*3/uL (ref 4.0–10.5)
nRBC: 0 % (ref 0.0–0.2)

## 2023-05-17 LAB — COMPREHENSIVE METABOLIC PANEL WITH GFR
ALT: 14 U/L (ref 0–44)
AST: 23 U/L (ref 15–41)
Albumin: 3.4 g/dL — ABNORMAL LOW (ref 3.5–5.0)
Alkaline Phosphatase: 77 U/L (ref 38–126)
Anion gap: 11 (ref 5–15)
BUN: 42 mg/dL — ABNORMAL HIGH (ref 8–23)
CO2: 28 mmol/L (ref 22–32)
Calcium: 9.1 mg/dL (ref 8.9–10.3)
Chloride: 97 mmol/L — ABNORMAL LOW (ref 98–111)
Creatinine, Ser: 2.42 mg/dL — ABNORMAL HIGH (ref 0.44–1.00)
GFR, Estimated: 21 mL/min — ABNORMAL LOW (ref >60)
Glucose, Bld: 183 mg/dL — ABNORMAL HIGH (ref 70–99)
Potassium: 3.2 mmol/L — ABNORMAL LOW (ref 3.5–5.1)
Sodium: 136 mmol/L (ref 135–145)
Total Bilirubin: 0.5 mg/dL (ref 0.0–1.2)
Total Protein: 7.4 g/dL (ref 6.5–8.1)

## 2023-05-17 LAB — TROPONIN I (HIGH SENSITIVITY)
Troponin I (High Sensitivity): 18 ng/L — ABNORMAL HIGH (ref <18)
Troponin I (High Sensitivity): 17 ng/L

## 2023-05-17 LAB — MAGNESIUM: Magnesium: 2.1 mg/dL (ref 1.7–2.4)

## 2023-05-17 MED ORDER — HYDROCHLOROTHIAZIDE 25 MG PO TABS
25.0000 mg | ORAL_TABLET | Freq: Every day | ORAL | Status: DC
  Filled 2023-05-17: qty 1

## 2023-05-17 MED ORDER — HYDRALAZINE HCL 20 MG/ML IJ SOLN
10.0000 mg | Freq: Once | INTRAMUSCULAR | Status: AC
  Administered 2023-05-17: 10 mg via INTRAVENOUS
  Filled 2023-05-17: qty 1

## 2023-05-17 MED ORDER — POTASSIUM CHLORIDE CRYS ER 20 MEQ PO TBCR
40.0000 meq | EXTENDED_RELEASE_TABLET | Freq: Once | ORAL | Status: AC
  Administered 2023-05-17: 40 meq via ORAL
  Filled 2023-05-17: qty 2

## 2023-05-17 MED ORDER — CARVEDILOL 12.5 MG PO TABS
25.0000 mg | ORAL_TABLET | Freq: Once | ORAL | Status: DC
Start: 2023-05-17 — End: 2023-05-17
  Filled 2023-05-17: qty 2

## 2023-05-17 MED ORDER — DICLOFENAC SODIUM 1 % EX GEL: 2.0000 g | Freq: Four times a day (QID) | CUTANEOUS | 0 refills | Status: AC

## 2023-05-17 MED ORDER — ACETAMINOPHEN 325 MG PO TABS
650.0000 mg | ORAL_TABLET | Freq: Once | ORAL | Status: AC
  Administered 2023-05-17: 650 mg via ORAL
  Filled 2023-05-17: qty 2

## 2023-05-17 NOTE — ED Provider Notes (Signed)
 [place] EMERGENCY DEPARTMENT AT [facility] Provider Note   CSN: [id number] Arrival date & time: 05/17/23  7829     History  Chief Complaint  Patient presents with   Fall   Foot Pain    Lionel Eyerman Gensch is a 73 y.o. female.  Patient is a 73 year old female who presents to the emergency department with a chief complaint of right foot pain.  Patient notes that she was attempting to adjust her oxygen  this morning when she was bending over when she became dizzy when she stood back up falling to the ground twisting her right foot.  Patient is unsure if she struck her head.  She is on Plavix  and aspirin  but denies any other anticoagulation.  She does admit to some mild pain to the right side of her neck as well as her left ribs.  She denies any pain to her back throughout.  She denies any other abdominal pain, nausea, vomiting, diarrhea.  She denies any numbness, paresthesias, unilateral weakness throughout.  She denies any active dizziness at this time.  There was no associated syncope.   Fall  Foot Pain       Home Medications Prior to Admission medications   Medication Sig Start Date End Date Taking? Authorizing Provider  aspirin  81 MG tablet Take 81 mg by mouth every morning.     [provider]  carvedilol  (COREG ) 25 MG tablet Take 1 tablet (25 mg total) by mouth 2 (two) times daily with a meal. 10/11/19   Tammie Fall, MD  Cholecalciferol  (VITAMIN D ) 50 MCG (2000 UT) CAPS Take 2,000 Units by mouth every morning.    [provider]  cloNIDine  (CATAPRES ) 0.3 MG tablet Take 1 tablet (0.3 mg total) by mouth 3 (three) times daily. 03/19/23   Elmarie Hacking, FNP  clopidogrel  (PLAVIX ) 75 MG tablet Take 75 mg by mouth daily. 05/17/21   [provider]  clotrimazole-betamethasone (LOTRISONE) cream Apply 1 Application topically daily as needed (Rash). 03/14/22   [provider]  doxazosin  (CARDURA ) 4 MG tablet TAKE 1 TABLET BY MOUTH  DAILY 03/11/23   Sabharwal, Aditya, DO  famotidine  (PEPCID ) 20 MG tablet Take 20 mg by mouth daily. 12/17/21   [provider]  furosemide  (LASIX ) 20 MG tablet Take 1 tablet (20 mg total) by mouth daily. 05/08/23   Sabharwal, Aditya, DO  gabapentin  (NEURONTIN ) 100 MG capsule Take 1 capsule (100 mg total) by mouth 3 (three) times daily. 09/19/19   Pokhrel, Laxman, MD  glimepiride  (AMARYL ) 1 MG tablet Take 1 tablet (1 mg total) by mouth every morning. 01/30/20 07/11/23  Justina Oman, MD  hydrALAZINE  (APRESOLINE ) 100 MG tablet Take 1 tablet (100 mg total) by mouth 3 (three) times daily. 06/25/22   Leona Rake, MD  hydrochlorothiazide  (HYDRODIURIL ) 25 MG tablet Take 1 tablet (25 mg total) by mouth daily. 07/11/22   Arleene Belt, PA-C  hydrOXYzine  (ATARAX ) 50 MG tablet Take 1 tablet (50 mg total) by mouth every 8 (eight) hours as needed. 06/25/22   Leona Rake, MD  isosorbide  mononitrate (IMDUR ) 60 MG 24 hr tablet TAKE 1 TABLET BY MOUTH DAILY 08/05/22   Arleene Belt, PA-C  JARDIANCE  10 MG TABS tablet TAKE ONE TABLET BY MOUTH DAILY 04/21/23   Sabharwal, Aditya, DO  levothyroxine  (SYNTHROID ) 150 MCG tablet Take 150 mcg by mouth See admin instructions. Take    [provider]  levothyroxine  (SYNTHROID ) 25 MCG tablet Take 1 tablet (25 mcg total) by  mouth daily. 09/19/19   Pokhrel, Laxman, MD  lovastatin (MEVACOR) 20 MG tablet Take 20 mg by mouth at bedtime. 06/20/20   [provider]  Multiple Vitamin (MULTIVITAMIN) tablet Take 1 tablet by mouth every morning.     [provider]  Multiple Vitamins-Minerals (HAIR SKIN NAILS PO) Take 5,000 mcg by mouth daily.    [provider]  Oxycodone  HCl 10 MG TABS Take 10 mg by mouth 4 (four) times daily as needed (pain). 08/08/19   [provider]  OXYGEN  Inhale 3 L into the lungs continuous.    [provider]  pantoprazole  (PROTONIX ) 40 MG tablet Take 1 tablet (40 mg total) by mouth daily.  09/20/19   Pokhrel, Laxman, MD  polyethylene glycol (MIRALAX  / GLYCOLAX ) packet Take 17 g by mouth daily as needed for mild constipation, moderate constipation or severe constipation.    [provider]  sertraline  (ZOLOFT ) 100 MG tablet Take 100 mg by mouth at bedtime.    [provider]  STIOLTO RESPIMAT  2.5-2.5 MCG/ACT AERS INHALE 2 PUFFS INTO THE LUNGS ONCE DAILY 04/23/23   Hunsucker, Archer Kobs, MD  triamcinolone cream (KENALOG) 0.1 % Apply topically 2 (two) times daily as needed (eczema). 01/29/22   [provider]      Allergies    Amlodipine, Lisinopril, Losartan , Codeine phosphate, Morphine  and codeine, Naproxen, Nsaids, Penicillins, Propoxyphene n-acetaminophen , Sulfamethoxazole-trimethoprim, and Flexeril  [cyclobenzaprine ]    Review of Systems   Review of Systems  Musculoskeletal:        Right foot pain  Neurological:  Positive for dizziness.    Physical Exam Updated Vital Signs BP (!) 164/110   Pulse 77   Temp 98.6 F (37 C) (Oral)   Resp 16   Ht 5\' 5"  (1.651 m)   Wt 78 kg   SpO2 94%   BMI 28.62 kg/m  Physical Exam Vitals and nursing note reviewed.  Constitutional:      Appearance: Normal appearance.  HENT:     Head: Normocephalic and atraumatic.     Nose: Nose normal.     Mouth/Throat:     Mouth: Mucous membranes are moist.  Eyes:     Extraocular Movements: Extraocular movements intact.     Conjunctiva/sclera: Conjunctivae normal.     Pupils: Pupils are equal, round, and reactive to light.  Neck:     Comments: Tender to palpation on the right side of the neck, no midline tenderness, no step-off or deformity Cardiovascular:     Rate and Rhythm: Normal rate and regular rhythm.     Pulses: Normal pulses.     Heart sounds: Normal heart sounds. No murmur heard.    No gallop.  Pulmonary:     Effort: Pulmonary effort is normal. No respiratory distress.     Breath sounds: Normal breath sounds. No stridor. No wheezing, rhonchi or rales.   Abdominal:     General: Abdomen is flat. Bowel sounds are normal. There is no distension.     Palpations: Abdomen is soft.     Tenderness: There is no abdominal tenderness. There is no guarding.  Musculoskeletal:        General: Normal range of motion.     Cervical back: Normal range of motion and neck supple. Tenderness present. No rigidity.     Comments: Tenderness palpation noted over the distal aspect of the right foot, nontender palpation over right ankle, knee or hip, pelvis stable to bilateral compression, nontender palpation of the left lower extremity diffusely, DP  and PT pulse are 2+ in bilateral extremities, sensation intact distally, full range of motion noted throughout, no obvious deformity or bruising, no skin breakdown ulceration, no lacerations or abrasions, nontender palpation over bilateral posterior knees throughout, radial pulse 2+ upper extremities, nontender palpation of the thoracic and lumbar spine, no step-off or deformity  Skin:    General: Skin is warm and dry.  Neurological:     General: No focal deficit present.     Mental Status: She is alert and oriented to person, place, and time. Mental status is at baseline.     Cranial Nerves: No cranial nerve deficit.     Sensory: No sensory deficit.     Motor: No weakness.     Coordination: Coordination normal.     Gait: Gait normal.  Psychiatric:        Mood and Affect: Mood normal.        Behavior: Behavior normal.        Thought Content: Thought content normal.        Judgment: Judgment normal.     ED Results / Procedures / Treatments   Labs (all labs ordered are listed, but only abnormal results are displayed) Labs Reviewed  URINALYSIS, ROUTINE W REFLEX MICROSCOPIC  COMPREHENSIVE METABOLIC PANEL WITH GFR  CBC WITH DIFFERENTIAL/PLATELET  MAGNESIUM   TROPONIN I (HIGH SENSITIVITY)    EKG None  Radiology No results found.  Procedures Procedures    Medications Ordered in ED Medications   acetaminophen  (TYLENOL ) tablet 650 mg (has no administration in time range)    ED Course/ Medical Decision Making/ A&P                                 Medical Decision Making Amount and/or Complexity of Data Reviewed Labs: ordered. Radiology: ordered.  Risk OTC drugs. Prescription drug management.   This patient presents to the ED for concern of right foot pain, dizziness differential diagnosis includes CVA, TIA, fracture, sprain, strain, electrolyte derangement, hypotension, orthostasis, ACS    Additional history obtained:  Additional history obtained from none External records from outside source obtained and reviewed including none   Lab Tests:  I Ordered, and personally interpreted labs.  The pertinent results include: No leukocytosis, no anemia, hypokalemia, elevated creatinine but at baseline, normal liver function, negative troponin, normal magnesium    Imaging Studies ordered:  I ordered imaging studies including CT scan of the head, cervical spine, right foot x-ray, right rib x-ray I independently visualized and interpreted imaging which showed no acute intracranial hemorrhage, no acute osseous injury of the cervical spine, no fracture of the right foot, no fracture of the right ribs I agree with the radiologist interpretation   Medicines ordered and prescription drug management:  I ordered medication including Tylenol  for pain Reevaluation of the patient after these medicines showed that the patient improved I have reviewed the patients home medicines and have made adjustments as needed   Problem List / ED Course:  Patient is doing well at this time and is stable for discharge home.  Workup in the emergency department has been overall unremarkable.  The most recent recheck of her blood pressure for myself was 150/120.  Patient was directed to take her blood pressure meds when she gets home.  She was given hydralazine  in the emergency department IV as she  does take this p.o. at home.  CT scan of the head was otherwise unremarkable.  CT  scan of the cervical spine demonstrated no acute fracture.  Patient is already aware of the mass in her neck and is being worked up for this.  Patient had no acute fracture noted to the right ribs or right foot.  Will place in a cam boot for comfort.  Discussed the importance of continued close follow-up with her primary care doctor on an outpatient basis.  Do not SPECT ACS at this time.  Troponins are at baseline and EKG demonstrated no acute ischemic changes.  Strict turn precautions were discussed for any new or worsening symptoms.  Patient voiced understanding and had no additional questions. Patient was fully evaluated by attending physician who is in agreement to plan at this time.    Social Determinants of Health:  none           Final Clinical Impression(s) / ED Diagnoses Final diagnoses:  None    Rx / DC Orders ED Discharge Orders     None         Emmalene Hare 05/17/23 1336    Ninetta Basket, MD 05/20/23 [phone]

## 2023-05-17 NOTE — Discharge Instructions (Addendum)
 Please follow-up closely with your primary care doctor on an outpatient basis for reevaluation.  Return to emergency department immediately for any new or worsening symptoms.

## 2023-05-17 NOTE — ED Notes (Signed)
 Called "[employer]" to provide her a ride home.

## 2023-05-17 NOTE — ED Triage Notes (Addendum)
 Patient BIB [facility] from home, Patient stated she got dizzy and fell. Complaint of rt foot pain. Patient takes plavix , Not sure if the hit her head. EMS first Blood pressure 186/100, second 150/98. Patient used 4L baseline of Oxygen . A&O x4.

## 2023-05-18 ENCOUNTER — Telehealth: Payer: Self-pay

## 2023-05-18 NOTE — Telephone Encounter (Signed)
 I informed patient that proper paper work was faxed in that states 4L

## 2023-06-08 ENCOUNTER — Other Ambulatory Visit (HOSPITAL_COMMUNITY)

## 2023-06-14 NOTE — Progress Notes (Incomplete)
 ***In Progress***    Advanced Heart Failure Clinic Note   Referring Physician: Wright Heal, MD  Primary Care: Wright Heal, MD Primary Cardiologist: Maudine Sos   HPI: [name] is a 73 y.o. 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    She was admitted to [facility] in 06/2022 with hypertensive crisis and pulmonary edema requiring BiPAP and IV diuresis.  EF during admission 60-65% with moderate to severe LVH.  Since that time she had been seen in [facility] clinic and had also undergone extensive amyloid evaluation which was negative.    She was seen for follow-up in AHF Clinic on 05/07/23. She reported feeling well overall. No reports of SOB or DOE with activity. She was still requiring 4 L of O2 at home. Her BP was improved in clinic at 118/72 mmHg and she reported no symptoms of orthostasis. Labs at this visit showed significantly elevated Scr and patient was asked to hold her furosemide  for 2 days then resume 20 mg daily and get repeat labs in a week.   On 05/17/23, the patient visited [facility] ED after experiencing a fall after standing up too quickly and becoming dizzy. Patient was unsure if she struck her head or had LOC. Head and C-spine CT were both negative for acute abnormalities. ECG was negative for conduction abnormalities. Her BP in the ED was elevated at 150/120 mmHg, but she had not taken her medications prior to arrival in the ED. No medications were changed at this encounter.  Today he returns to HF clinic for pharmacist medication titration. ***.   Shortness of breath/dyspnea on exertion? {YES NO:22349}  Orthopnea/PND? {YES NO:22349} Edema? {YES  NO:22349} Lightheadedness/dizziness? {YES NO:22349} Daily weights at home? {YES NO:22349} Blood pressure/heart rate monitoring at home? {YES [id number] Following low-sodium/fluid-restricted diet? {YES NO:22349}  HF Medications: Carvedilol  25 mg BID Jardiance  10 mg daily Hydralazine  100 mg TID Isosorbide  mononitrate 60 mg daily Furosemide  20 mg daily Other: hydrochlorothiazide  25 daily, clonidine  0.3 mg TID  Has the patient been experiencing any side effects to the medications prescribed?  {YES NO:22349}  Does the patient have any problems obtaining medications due to transportation or finances?   Insurance: [employer] (Medicare); ***  Understanding of regimen: {excellent/good/fair/poor:19665} Understanding of indications: {excellent/good/fair/poor:19665} Potential of compliance: {excellent/good/fair/poor:19665} Patient understands to avoid NSAIDs. Patient understands to avoid decongestants.    Pertinent Lab Values: 05/17/23: Serum creatinine 2.42 mg/dL, BUN 42 mg/dL, Potassium 3.2 mmol/L, Sodium 136 mmol/L 05/07/23: BNP 191.5 pg/mL   Vital Signs: Weight: *** (last clinic weight: 184 lbs) Blood pressure: *** (last BP: 118/72 mmHg) Heart rate: *** (last HR: 80 bpm)  ASSESSMENT & PLAN:   Heart failure with preserved ejection fraction -Severe LVH by echocardiogram likely secondary to uncontrolled HTN. - Myeloma panel unremarkable on 07/11/22.  - PYP scan on 02/05/23 personally reviewed, negative. Grade 0 uptake. No further work up required.  - NYHA class ***, ***euvolemic on exam.  - Furosemide  *** - Carvedilol  *** - ***Continue spironolactone  25 mg daily - ***Continue hydralazine  100 mg 3 times daily with isosorbide  mononitrate ER 60 mg daily - ***Continue Jardiance  10 mg daily    2.  Hypertension -Longstanding history of uncontrolled hypertension, moderate left renal artery stenosis on  CTA from June 2024. -Referred to hypertension clinic.  - No renal artery stenosis by dopplers  from 09/19/22 - BP now very well controlled.*** - Continue carvedilol , hydralazine , isosorbide  mononitrate; will plan to wean off clonidine  at next follow up. Decrease to 0.3mg  tablets.***   3.Chronic respiratory failure/COPD -On chronic O2 at night and with activity - PFTs from 08/27/22 with FVC of 1.89L (61%), FEV1 1.42 (61%), FEV/FVC 99% predicted, DLCO 36% predicted. -Reports that dyspnea has improved since diuresis. - Saw Dr. Marygrace Snellen on 02/09/23, reviewed notes, will continue Stiolto. No concern for ILD.    4. DM II -Last A1c was 7.0% -continue Jardiance    5. 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 Sinus node dysfunction s/p PPM -No AF on last interrogation  7. 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 Tobacco use -Has not smoked for 3 months now; counseled on importance of continued cessation.   Follow up 3 weeks with MD***   Albino Alu, PharmD PGY2 Cardiology Pharmacy Resident  Luster Salters, PharmD, BCPS, [facility], CPP Heart Failure Clinic Pharmacist [phone]

## 2023-06-17 NOTE — Addendum Note (Signed)
 Addended by: Lott Rouleau A on: 06/17/2023 09:00 AM   Modules accepted: Orders

## 2023-06-18 ENCOUNTER — Ambulatory Visit (HOSPITAL_COMMUNITY)
Admission: RE | Admit: 2023-06-18 | Discharge: 2023-06-18 | Disposition: A | Discharge status: Home / Self Care | Source: Ambulatory Visit | Attending: Internal Medicine | Admitting: Internal Medicine

## 2023-06-18 VITALS — BP 114/64 | HR 72 | Wt 179.6 lb

## 2023-06-18 DIAGNOSIS — Z79899 Other long term (current) drug therapy: Secondary | ICD-10-CM | POA: Diagnosis not present

## 2023-06-18 DIAGNOSIS — Z950 Presence of cardiac pacemaker: Secondary | ICD-10-CM | POA: Diagnosis not present

## 2023-06-18 DIAGNOSIS — J449 Chronic obstructive pulmonary disease, unspecified: Secondary | ICD-10-CM | POA: Diagnosis not present

## 2023-06-18 DIAGNOSIS — E1151 Type 2 diabetes mellitus with diabetic peripheral angiopathy without gangrene: Secondary | ICD-10-CM | POA: Diagnosis not present

## 2023-06-18 DIAGNOSIS — I509 Heart failure, unspecified: Secondary | ICD-10-CM

## 2023-06-18 DIAGNOSIS — J9610 Chronic respiratory failure, unspecified whether with hypoxia or hypercapnia: Secondary | ICD-10-CM | POA: Diagnosis not present

## 2023-06-18 DIAGNOSIS — Z7982 Long term (current) use of aspirin: Secondary | ICD-10-CM | POA: Insufficient documentation

## 2023-06-18 DIAGNOSIS — Z86718 Personal history of other venous thrombosis and embolism: Secondary | ICD-10-CM | POA: Insufficient documentation

## 2023-06-18 DIAGNOSIS — I4891 Unspecified atrial fibrillation: Secondary | ICD-10-CM | POA: Insufficient documentation

## 2023-06-18 DIAGNOSIS — I5032 Chronic diastolic (congestive) heart failure: Secondary | ICD-10-CM

## 2023-06-18 DIAGNOSIS — I110 Hypertensive heart disease with heart failure: Secondary | ICD-10-CM | POA: Insufficient documentation

## 2023-06-18 DIAGNOSIS — I5030 Unspecified diastolic (congestive) heart failure: Secondary | ICD-10-CM | POA: Diagnosis present

## 2023-06-18 DIAGNOSIS — Z9981 Dependence on supplemental oxygen: Secondary | ICD-10-CM | POA: Diagnosis not present

## 2023-06-18 DIAGNOSIS — Z87891 Personal history of nicotine dependence: Secondary | ICD-10-CM | POA: Insufficient documentation

## 2023-06-18 LAB — BASIC METABOLIC PANEL WITH GFR
Anion gap: 12 (ref 5–15)
BUN: 43 mg/dL — ABNORMAL HIGH (ref 8–23)
CO2: 30 mmol/L (ref 22–32)
Calcium: 8.7 mg/dL — ABNORMAL LOW (ref 8.9–10.3)
Chloride: 95 mmol/L — ABNORMAL LOW (ref 98–111)
Creatinine, Ser: 2.32 mg/dL — ABNORMAL HIGH (ref 0.44–1.00)
GFR, Estimated: 22 mL/min — ABNORMAL LOW (ref >60)
Glucose, Bld: 163 mg/dL — ABNORMAL HIGH (ref 70–99)
Potassium: 3.1 mmol/L — ABNORMAL LOW (ref 3.5–5.1)
Sodium: 137 mmol/L (ref 135–145)

## 2023-06-18 LAB — BRAIN NATRIURETIC PEPTIDE: B Natriuretic Peptide: 115.9 pg/mL — ABNORMAL HIGH (ref 0.0–100.0)

## 2023-06-18 NOTE — Progress Notes (Addendum)
 Advanced Heart Failure Clinic Note   Referring Physician: Wright Heal, MD  Primary Care: Wright Heal, MD Primary Cardiologist: Maudine Sos HF Cardiologist: Dr. Bruce Caper   HPI: [name] is a 73 y.o. 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    She was admitted to [facility] in 06/2022 with hypertensive crisis and pulmonary edema requiring BiPAP and IV diuresis.  EF during admission 60-65% with moderate to severe LVH.  Since that time she had been seen in [facility] clinic and had also undergone extensive amyloid evaluation which was negative.    She was seen for follow-up in AHF Clinic on 05/07/23. She reported feeling well overall. No reports of SOB or DOE with activity. She was still requiring 4 L of O2 at home. Her BP was improved in clinic at 118/72 mmHg and she reported no symptoms of orthostasis. Labs at this visit showed significantly elevated Scr and patient was asked to hold her furosemide  for 2 days then resume at 20 mg daily (decreased from 60 mg daily) and get repeat labs in a week.   On 05/17/23, the patient visited [facility] ED after experiencing a fall after standing up too quickly and becoming dizzy. Patient was unsure if she struck her head or had LOC. Head and C-spine CT were both negative for acute abnormalities. ECG was negative for conduction abnormalities. Her BP in the ED was elevated at 150/120 mmHg, but she had not taken her medications prior to arrival in the ED. No medications were changed at this encounter.  Today she returns to HF clinic for pharmacist-led medication titration. She reports feeling well overall. She has not experienced any recurrence of  lightheadedness, dizziness, syncope, or presyncope. Her breathing status remains unchanged, and she continues to require 4 L of supplemental oxygen  at rest. She is able to ambulate at home for up to 40 minutes without oxygen  before her SpO2 drops below 90%. Her home weights have remained stable. She has no lower extremity edema and has not noticed any increased swelling since reducing her furosemide  dose. She denies paroxysmal nocturnal dyspnea or orthopnea. Home blood pressures have been stable, with pre-medication readings around 160/90 mmHg and post-medication readings around 120/70 mmHg. She denies chest pain or palpitations. She reports good adherence to all medications as well as to a low-sodium, fluid-restricted diet.  HF Medications: Jardiance  10 mg daily Hydralazine  100 mg TID Isosorbide  mononitrate 60 mg daily Furosemide  20 mg daily Other: carvedilol  25 mg BID, hydrochlorothiazide  25 daily, clonidine  0.3 mg TID, doxazosin  4 mg daily  Has the patient been experiencing any side effects to the medications prescribed?  no  Does the patient have any problems obtaining medications due to transportation or finances?   Insurance: [employer] (Medicare); no issues with getting her medications refilled  Understanding of regimen: excellent Understanding of indications: excellent Potential of compliance: excellent Patient understands to avoid NSAIDs. Patient understands to avoid decongestants.    Pertinent Lab Values: 05/17/23: Serum creatinine 2.42 mg/dL, BUN 42 mg/dL, Potassium 3.2 mmol/L, Sodium 136 mmol/L 05/07/23: BNP 191.5 pg/mL   Vital Signs: Weight: 179 lbs (last clinic weight: 184 lbs) Blood pressure: 114/64 mmHg (last BP: 118/72 mmHg) Heart rate: 72 bpm (last HR: 80  bpm)  ASSESSMENT & PLAN:   Heart failure with preserved ejection fraction - Severe LVH by echocardiogram likely secondary to uncontrolled HTN. - Myeloma panel unremarkable on 07/11/22.  - PYP scan on 02/05/23 personally  reviewed, negative. Grade 0 uptake. No further work up required.  - NYHA class III, euvolemic on exam.  - Continue furosemide  20 mg daily. BMET + BNP today. - Continue spironolactone  25 mg daily - Continue hydralazine  100 mg 3 times daily with isosorbide  mononitrate ER 60 mg daily - Continue Jardiance  10 mg daily - No ACEI/ARB/ARNI with history of angioedema/ lip swelling to both ACEI and ARB.     2.  Hypertension - Longstanding history of uncontrolled hypertension, moderate left renal artery stenosis on CTA from June 2024. - No renal artery stenosis by dopplers from 09/19/22 - BP well controlled today. She is adherent to all medications.  - Continue carvedilol , hydralazine , isosorbide  mononitrate, doxazosin , clonidine  -Will not adjust regimen today as her BP is under good control. Still hypertensive to 160/90 mmHg pre-medications but  post-medication readings around 120/70 mmHg.    3.Chronic respiratory failure/COPD - On chronic O2 at night and with activity - PFTs from 08/27/22 with FVC of 1.89L (61%), FEV1 1.42 (61%), FEV/FVC 99% predicted, DLCO 36% predicted. - Followed by Pulmonology - On Stiolto   4. DM II -Last A1c was 7.0% -continue Jardiance    5. 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 Sinus node dysfunction s/p PPM -No AF on last interrogation  7. CKD IIIb/IV - Prior baseline Cr 1.3-1.5 mg/dL, now with recent AKIs - Likely secondary to hypertensive nephropathy - BMET + BNP today - Continue Jardiance    8. Tobacco use -Has not smoked for 4 months now; counseled on importance of continued cessation.   Follow-up in 3 weeks with MD   Albino Alu, PharmD PGY2 Cardiology Pharmacy Resident  Luster Salters, PharmD, BCPS, [facility], CPP Heart Failure Clinic Pharmacist [phone]

## 2023-06-18 NOTE — Patient Instructions (Addendum)
 It was a pleasure seeing you today!  MEDICATIONS: -No medication changes today -Call if you have questions about your medications.  LABS: -We will call you if your labs need attention.  NEXT APPOINTMENT: Return to clinic in 3 weeks with Dr. Bruce Caper.  In general, to take care of your heart failure: -Limit your fluid intake to 2 Liters (half-gallon) per day.   -Limit your salt intake to ideally 2-3 grams (2000-3000 mg) per day. -Weigh yourself daily and record, and bring that "weight diary" to your next appointment.  (Weight gain of 2-3 pounds in 1 day typically means fluid weight.) -The medications for your heart are to help your heart and help you live longer.   -Please contact us  before stopping any of your heart medications.  Call the clinic at [phone] with questions or to reschedule future appointments.

## 2023-07-03 ENCOUNTER — Other Ambulatory Visit (HOSPITAL_COMMUNITY): Payer: Self-pay | Admitting: Nurse Practitioner

## 2023-07-03 DIAGNOSIS — M545 Low back pain, unspecified: Secondary | ICD-10-CM

## 2023-07-07 ENCOUNTER — Encounter (HOSPITAL_COMMUNITY): Admitting: Cardiology

## 2023-07-09 ENCOUNTER — Encounter (HOSPITAL_COMMUNITY): Admitting: Cardiology

## 2023-07-09 NOTE — Progress Notes (Signed)
 ADVANCED HEART FAILURE CLINIC NOTE  Referring Physician: Wright Heal, MD  Primary Care: Wright Heal, MD Primary Cardiologist: Maudine Sos  CC: HFpEF  HPI: [name] is a 73 y.o. 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  She was admitted to [facility] in 6/24 with hypertensive crisis and pulmonary edema requiring BiPAP and IV Lasix .  EF during admission 60 to 65% with moderate to severe LVH.  Since that time she has been seen in [facility] clinic. She has now also undergone extensive amyloid evaluation which has been negative.   Interval history - Her blood pressures are finally well controlled after several weeks of medication adjustments. Reports BP at home in the 120s; today BP is 118/72.  - No lightheadedness, shortness of breath, LE edema.  - Her only complaints are lower extremity myalgias at night.  - Continues to work continuous 4L O2 at home.   Current Outpatient Medications  Medication Sig Dispense Refill   aspirin  81 MG tablet Take 81 mg by mouth every morning.      carvedilol  (COREG ) 25 MG tablet Take 1 tablet (25 mg total) by mouth 2 (two) times daily with a meal. 180 tablet 3   Cholecalciferol  (VITAMIN D ) 50 MCG (2000 UT) CAPS Take 2,000 Units by mouth every morning.     cloNIDine  (CATAPRES ) 0.3 MG tablet Take 1 tablet (0.3 mg total) by mouth 3 (three) times daily. 90 tablet 11   clopidogrel  (PLAVIX ) 75 MG tablet Take 75 mg by mouth daily.     clotrimazole-betamethasone (LOTRISONE) cream Apply 1 Application topically daily as needed (Rash).     diclofenac  Sodium (VOLTAREN  ARTHRITIS PAIN) 1 % GEL Apply 2 g topically 4 (four) times daily. 150 g 0    doxazosin  (CARDURA ) 4 MG tablet TAKE 1 TABLET BY MOUTH DAILY 100 tablet 2   famotidine  (PEPCID ) 20 MG tablet Take 20 mg by mouth daily.     furosemide  (LASIX ) 20 MG tablet Take 1 tablet (20 mg total) by mouth daily. 90 tablet 3   gabapentin  (NEURONTIN ) 100 MG capsule Take 1 capsule (100 mg total) by mouth 3 (three) times daily. 90 capsule 2   glimepiride  (AMARYL ) 1 MG tablet Take 1 tablet (1 mg total) by mouth every morning. 30 tablet 3   hydrALAZINE  (APRESOLINE ) 100 MG tablet Take 1 tablet (100 mg total) by mouth 3 (three) times daily. 90 tablet 0   hydrochlorothiazide  (HYDRODIURIL ) 25 MG tablet Take 1 tablet (25 mg total) by mouth daily. 30 tablet 1   hydrOXYzine  (ATARAX ) 50 MG tablet Take 1 tablet (50 mg total) by mouth every 8 (eight) hours as needed. 30 tablet 0   isosorbide  mononitrate (IMDUR ) 60 MG 24 hr tablet TAKE 1 TABLET BY MOUTH DAILY 90 tablet 3   JARDIANCE  10 MG TABS tablet TAKE ONE TABLET BY MOUTH DAILY 30 tablet 3   levothyroxine  (SYNTHROID ) 150 MCG tablet Take 150 mcg by mouth See admin instructions. Take     levothyroxine  (SYNTHROID ) 25 MCG tablet Take 1 tablet (25 mcg total) by mouth daily. 30 tablet 2   lovastatin (MEVACOR) 20 MG tablet Take 20 mg by mouth at bedtime.     Multiple Vitamin (MULTIVITAMIN) tablet Take  1 tablet by mouth every morning.      Multiple Vitamins-Minerals (HAIR SKIN NAILS PO) Take 5,000 mcg by mouth daily.     Oxycodone  HCl 10 MG TABS Take 10 mg by mouth 4 (four) times daily as needed (pain).     OXYGEN  Inhale 3 L into the lungs continuous.     pantoprazole  (PROTONIX ) 40 MG tablet Take 1 tablet (40 mg total) by mouth daily. 30 tablet 1   polyethylene glycol (MIRALAX  / GLYCOLAX ) packet Take 17 g by mouth daily as needed for mild constipation, moderate constipation or severe constipation.     sertraline  (ZOLOFT ) 100 MG tablet Take 100 mg by mouth at bedtime.     STIOLTO RESPIMAT  2.5-2.5 MCG/ACT AERS INHALE 2 PUFFS INTO THE LUNGS ONCE DAILY 4 g 5    triamcinolone cream (KENALOG) 0.1 % Apply topically 2 (two) times daily as needed (eczema).     No current facility-administered medications for this visit.      PHYSICAL EXAM: There were no vitals filed for this visit. GENERAL: NAD Lungs- *** CARDIAC:  JVP: *** cm          Normal rate with regular rhythm. *** murmur.  Pulses ***. *** edema.  ABDOMEN: Soft, non-tender, non-distended.  EXTREMITIES: Warm and well perfused.  NEUROLOGIC: No obvious FND   DATA REVIEW  ECG: 07/11/22: atrial paced, LVH as per my personal interpretation  ECHO: 06/23/22: Normal LVEF, severe LVH as per my personal interpretation  ASSESSMENT & PLAN:  Heart failure with preserved ejection fraction -Severe LVH by echocardiogram likely secondary to uncontrolled HTN. - Myeloma panel unremarkable on 07/11/22.  - PYP scan on 02/05/23 personally reviewed, negative. Grade 0 uptake. No further work up required.  -Spironolactone  25 mg daily -Hydralazine  100 mg 3 times daily with Imdur  60 mg -Continue Jardiance  10 mg.  - euvolemic on exam.  - Repeat BMP/BNP.  - Reviewed device interrogation today with 1 episode of NSVT; otherwise no AT/AF.   2.  Uncontrolled Hypertension -Longstanding history of uncontrolled hypertension, moderate left renal artery stenosis on CTA from June 2024. -Referred to hypertension clinic.  - No renal artery stenosis by dopplers from 09/19/22 - BP now very well controlled. Continue  - Continue coreg  25mg  BID, hydralazine , imdur  60; will plan to wean off clonidine  at next follow up. Decrease to 0.3mg  tablets.   3.Chronic respiratory failure/COPD -On chronic O2 at night and with activity - PFTs from 08/27/22 with FVC of 1.89L (61%), FEV1 1.42 (61%), FEV/FVC 99% predicted, DLCO 36% predicted. -Reports that dyspnea has improved since diuresis. - Saw Dr. Marygrace Snellen on 02/09/23, reviewed notes, will continue Stiolto. No concern for ILD.    4. DM II -Last A1c was 7.0 -continue jardiance  10mg   daily.    5. PAD -thoracic aortic intramural hematoma/penetrating aortic ulcer s/p TEVAR in 08/21 -h/x viabahn stent to left external iliac artery pseudoaneurysm 07/22 -Followed by [facility] -On aspirin  and plavix    6. Sinus node dysfunction s/p PPM Afib - Reviewed device interrogation today; no AT/AF   7. 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  8. Tobacco use -Has not smoked for 3 months now; counseled on importance of continued cessation.   I spent 30 minutes caring for this patient today including face to face time, ordering and reviewing labs, counseling on smoking cessation, seeing the patient, documenting in the record,  and arranging follow ups.    Margeart Allender Advanced Heart Failure Mechanical Circulatory Support

## 2023-07-10 ENCOUNTER — Encounter (HOSPITAL_COMMUNITY): Payer: Self-pay | Admitting: Cardiology

## 2023-07-10 ENCOUNTER — Ambulatory Visit (HOSPITAL_COMMUNITY)
Admission: RE | Admit: 2023-07-10 | Discharge: 2023-07-10 | Disposition: A | Discharge status: Home / Self Care | Source: Ambulatory Visit | Attending: Cardiology | Admitting: Cardiology

## 2023-07-10 VITALS — BP 140/78 | HR 72 | Wt 180.2 lb

## 2023-07-10 DIAGNOSIS — J9610 Chronic respiratory failure, unspecified whether with hypoxia or hypercapnia: Secondary | ICD-10-CM | POA: Diagnosis not present

## 2023-07-10 DIAGNOSIS — I739 Peripheral vascular disease, unspecified: Secondary | ICD-10-CM | POA: Diagnosis present

## 2023-07-10 DIAGNOSIS — E1151 Type 2 diabetes mellitus with diabetic peripheral angiopathy without gangrene: Secondary | ICD-10-CM | POA: Diagnosis not present

## 2023-07-10 DIAGNOSIS — I5032 Chronic diastolic (congestive) heart failure: Secondary | ICD-10-CM

## 2023-07-10 DIAGNOSIS — N184 Chronic kidney disease, stage 4 (severe): Secondary | ICD-10-CM | POA: Diagnosis not present

## 2023-07-10 DIAGNOSIS — I130 Hypertensive heart and chronic kidney disease with heart failure and stage 1 through stage 4 chronic kidney disease, or unspecified chronic kidney disease: Secondary | ICD-10-CM | POA: Insufficient documentation

## 2023-07-10 DIAGNOSIS — I701 Atherosclerosis of renal artery: Secondary | ICD-10-CM | POA: Insufficient documentation

## 2023-07-10 DIAGNOSIS — Z7984 Long term (current) use of oral hypoglycemic drugs: Secondary | ICD-10-CM | POA: Diagnosis not present

## 2023-07-10 DIAGNOSIS — J449 Chronic obstructive pulmonary disease, unspecified: Secondary | ICD-10-CM | POA: Diagnosis not present

## 2023-07-10 DIAGNOSIS — G4736 Sleep related hypoventilation in conditions classified elsewhere: Secondary | ICD-10-CM | POA: Diagnosis not present

## 2023-07-10 DIAGNOSIS — I10 Essential (primary) hypertension: Secondary | ICD-10-CM | POA: Diagnosis not present

## 2023-07-10 DIAGNOSIS — Z7902 Long term (current) use of antithrombotics/antiplatelets: Secondary | ICD-10-CM | POA: Insufficient documentation

## 2023-07-10 DIAGNOSIS — Z950 Presence of cardiac pacemaker: Secondary | ICD-10-CM | POA: Diagnosis not present

## 2023-07-10 DIAGNOSIS — I482 Chronic atrial fibrillation, unspecified: Secondary | ICD-10-CM

## 2023-07-10 DIAGNOSIS — Z86718 Personal history of other venous thrombosis and embolism: Secondary | ICD-10-CM | POA: Diagnosis present

## 2023-07-10 DIAGNOSIS — E1122 Type 2 diabetes mellitus with diabetic chronic kidney disease: Secondary | ICD-10-CM | POA: Diagnosis not present

## 2023-07-10 DIAGNOSIS — Z87891 Personal history of nicotine dependence: Secondary | ICD-10-CM | POA: Insufficient documentation

## 2023-07-10 NOTE — Patient Instructions (Signed)
  Follow-Up in: 4 months PLEASE CALL OUR OFFICE AROUND AUGUST TO GET SCHEDULED FOR YOUR APPOINTMENT. PHONE NUMBER IS [phone] OPTION 2   At the Advanced Heart Failure Clinic, you and your health needs are our priority. We have a designated team specialized in the treatment of Heart Failure. This Care Team includes your primary Heart Failure Specialized Cardiologist (physician), Advanced Practice Providers (APPs- Physician Assistants and Nurse Practitioners), and Pharmacist who all work together to provide you with the care you need, when you need it.   You may see any of the following providers on your designated Care Team at your next follow up:  Dr. Jules Oar Dr. Peder Bourdon Dr. Alwin Baars Dr. Judyth Nunnery Nieves Bars, NP Ruddy Corral, [place] [facility] [place], [place] Dennise Fitz, NP [place] Lee, NP Luster Salters, PharmD   Please be sure to bring in all your medications bottles to every appointment.   Need to Contact Us :  If you have any questions or concerns before your next appointment please send us  a message through [place] or call our office at [phone].    TO LEAVE A MESSAGE FOR THE NURSE SELECT OPTION 2, PLEASE LEAVE A MESSAGE INCLUDING: YOUR NAME DATE OF BIRTH CALL BACK NUMBER REASON FOR CALL**this is important as we prioritize the call backs  YOU WILL RECEIVE A CALL BACK THE SAME DAY AS LONG AS YOU CALL BEFORE 4:00 PM

## 2023-07-10 NOTE — Addendum Note (Signed)
 Encounter addended by: Deitra Fava, RN on: 07/10/2023 10:15 AM  Actions taken: Pend clinical note, Clinical Note Signed

## 2023-07-30 ENCOUNTER — Other Ambulatory Visit (HOSPITAL_COMMUNITY): Payer: Self-pay | Admitting: Cardiology

## 2023-08-07 ENCOUNTER — Ambulatory Visit: Payer: 59

## 2023-08-07 DIAGNOSIS — I495 Sick sinus syndrome: Secondary | ICD-10-CM

## 2023-08-11 LAB — CUP PACEART REMOTE DEVICE CHECK
Battery Remaining Longevity: 141 mo
Battery Voltage: 3.14 V
Brady Statistic AP VP Percent: 1.76 %
Brady Statistic AP VS Percent: 92.81 %
Brady Statistic AS VP Percent: 0.2 %
Brady Statistic AS VS Percent: 5.24 %
Brady Statistic RA Percent Paced: 94.6 %
Brady Statistic RV Percent Paced: 1.97 %
Date Time Interrogation Session: 20250717194106
Implantable Lead Connection Status: 753985
Implantable Lead Connection Status: 753985
Implantable Lead Implant Date: 19970929
Implantable Lead Implant Date: 19970929
Implantable Lead Location: 753859
Implantable Lead Location: 753860
Implantable Lead Model: 4068
Implantable Lead Model: 5092
Implantable Pulse Generator Implant Date: 20241017
Lead Channel Impedance Value: 342 Ohm
Lead Channel Impedance Value: 342 Ohm
Lead Channel Impedance Value: 380 Ohm
Lead Channel Impedance Value: 418 Ohm
Lead Channel Pacing Threshold Amplitude: 1.25 V
Lead Channel Pacing Threshold Amplitude: 1.375 V
Lead Channel Pacing Threshold Pulse Width: 0.4 ms
Lead Channel Pacing Threshold Pulse Width: 0.4 ms
Lead Channel Sensing Intrinsic Amplitude: 0.625 mV
Lead Channel Sensing Intrinsic Amplitude: 0.625 mV
Lead Channel Sensing Intrinsic Amplitude: 14.25 mV
Lead Channel Sensing Intrinsic Amplitude: 14.25 mV
Lead Channel Setting Pacing Amplitude: 2 V
Lead Channel Setting Pacing Amplitude: 2.75 V
Lead Channel Setting Pacing Pulse Width: 0.4 ms
Lead Channel Setting Sensing Sensitivity: 1.2 mV
Zone Setting Status: 755011

## 2023-08-12 ENCOUNTER — Ambulatory Visit: Payer: Self-pay | Admitting: Internal Medicine

## 2023-08-25 ENCOUNTER — Other Ambulatory Visit (HOSPITAL_COMMUNITY): Payer: Self-pay | Admitting: Cardiology

## 2023-08-28 ENCOUNTER — Telehealth (HOSPITAL_COMMUNITY): Payer: Self-pay | Admitting: Cardiology

## 2023-08-28 NOTE — Addendum Note (Signed)
 Addended by: Destina Mantei, DALTON HERO on: 08/28/2023 04:14 PM   Modules accepted: Orders

## 2023-08-28 NOTE — Telephone Encounter (Signed)
Pt called and voiced understanding.

## 2023-08-28 NOTE — Telephone Encounter (Signed)
 Patient called to report a bad yeast infection Reports side effect of jardiance   Please advise

## 2023-09-14 ENCOUNTER — Ambulatory Visit (INDEPENDENT_AMBULATORY_CARE_PROVIDER_SITE_OTHER): Admitting: Podiatry

## 2023-09-14 ENCOUNTER — Ambulatory Visit: Admitting: Podiatry

## 2023-09-14 VITALS — Ht 65.0 in | Wt 171.0 lb

## 2023-09-14 DIAGNOSIS — B351 Tinea unguium: Secondary | ICD-10-CM

## 2023-09-14 DIAGNOSIS — D492 Neoplasm of unspecified behavior of bone, soft tissue, and skin: Secondary | ICD-10-CM | POA: Diagnosis not present

## 2023-09-14 DIAGNOSIS — M79674 Pain in right toe(s): Secondary | ICD-10-CM

## 2023-09-14 DIAGNOSIS — M79675 Pain in left toe(s): Secondary | ICD-10-CM

## 2023-09-14 NOTE — Progress Notes (Unsigned)
 Subjective:  Patient ID: [name], female    DOB: 11-Dec-1950,  MRN: [id number]  [name] presents to clinic today for:  Chief Complaint  Patient presents with   Diabetes    [facility] nail trim wanted to check dark spot on bottom of R heel . A1c 6.8.  Plavix  and 81 mg asprin   Patient notes nails are thick, discolored, elongated and painful in shoegear when trying to ambulate.  She denies any loss of sensation or burning/tingling in the feet or toes.  She has noticed a dark spot on the bottom of the right heel that she would like to have checked out.  She wants to make sure this is not cancerous.  She thinks its only been there for a couple of months.  She notes some discomfort when its directly pressed on.  Denies stepping on a foreign object and denies any drainage.  PCP is Norval Kettle, MD.  Past Medical History:  Diagnosis Date   Anemia    Anxiety    Arthritis    Atrial fib/flutter, transient (HCC)    Cataract    COPD (chronic obstructive pulmonary disease) (HCC)    Depression    Diabetes mellitus    Diverticulosis    DJD (degenerative joint disease)    GERD (gastroesophageal reflux disease)    Gout    History of pneumonia    Hypertension    Hypothyroidism    Internal hemorrhoids    Past Surgical History:  Procedure Laterality Date   ABDOMINAL AORTOGRAM W/LOWER EXTREMITY N/A 07/24/2020   Procedure: ABDOMINAL AORTOGRAM W/LOWER EXTREMITY;  Surgeon: Serene Gaile ORN, MD;  [facility]: [facility];  Service: Cardiovascular;  Laterality: N/A;   CAROTID STENT  05/2021   CATARACT EXTRACTION W/ INTRAOCULAR LENS IMPLANT Left    ENDARTERECTOMY Left 09/09/2019   Procedure: LEFT ILIAC ENDARTERECTOMY;  Surgeon: Serene Gaile ORN, MD;  Location: [facility];  Service: Vascular;  Laterality: Left;   HERNIA REPAIR     INSERTION OF ILIAC STENT Left 09/09/2019   Procedure: INSERTION OF LEFT ILIAC STENT;  Surgeon: Serene Gaile ORN, MD;  Location: [facility] OR;  Service:  Vascular;  Laterality: Left;   PACEMAKER INSERTION     PERIPHERAL VASCULAR INTERVENTION Left 07/24/2020   Procedure: PERIPHERAL VASCULAR INTERVENTION;  Surgeon: Serene Gaile ORN, MD;  [facility];  Service: Cardiovascular;  Laterality: Left;   PPM GENERATOR CHANGEOUT N/A 11/06/2022   Procedure: PPM GENERATOR CHANGEOUT;  Surgeon: Waddell Danelle ORN, MD;  [facility]: [facility];  Service: Cardiovascular;  Laterality: N/A;   THORACIC AORTIC ENDOVASCULAR STENT GRAFT N/A 09/09/2019   Procedure: THORACIC AORTIC ENDOVASCULAR STENT GRAFT WITH LEFT ILIAC CONDUIT;  Surgeon: Serene Gaile ORN, MD;  Location: [facility];  Service: Vascular;  Laterality: N/A;   TUBAL LIGATION     ULTRASOUND GUIDANCE FOR VASCULAR ACCESS Left 09/09/2019   Procedure: ULTRASOUND GUIDANCE FOR VASCULAR ACCESS;  Surgeon: Serene Gaile ORN, MD;  Location: [facility];  Service: Vascular;  Laterality: Left;   VAGINAL HYSTERECTOMY     Allergies  Allergen Reactions   Amlodipine Swelling    Started very close to episode of angioedema to ACE, unknown if this added effects or just residual   Lisinopril     angioedema   Losartan  Swelling    Lip swelling    Codeine Phosphate Other (See Comments)    REACTION: UNKNOWN   Morphine  And Codeine Other (See Comments)    Cannot take:Heart  problems   Naproxen Nausea And Vomiting   Nsaids Nausea Only   Penicillins Other (See Comments)    UNKNOWN REACTION   Propoxyphene N-Acetaminophen  Other (See Comments)    Makes me gag   Sulfamethoxazole-Trimethoprim Other (See Comments)    Gi upset   Flexeril  [Cyclobenzaprine ] Anxiety and Other (See Comments)    Pt states medication gives her nightmares and insomnia.    Review of Systems: Negative except as noted in the HPI.  Objective:  [name] is a pleasant 73 y.o. female in NAD. AAO x 3.  Vascular Examination: Capillary refill time is 3-5 seconds to toes bilateral. Palpable pedal pulses b/l LE. Digital hair present b/l.   Skin temperature gradient WNL b/l. No varicosities b/l. No cyanosis noted b/l.   Dermatological Examination: Pedal skin with normal turgor, texture and tone b/l. No open wounds. No interdigital macerations b/l. Toenails x10 are 3mm thick, discolored, dystrophic with subungual debris. There is pain with compression of the nail plates.  They are elongated x10.  There is a well-defined, round, melanocytic skin lesion on the plantar aspect of the right heel.  This is darker than any surrounding lesions or areas of increased pigment.  There is pain on palpation to the central portion of the lesion.  The lesion is color dark brown/black.  No crusting is noted     Latest Ref Rng & Units 12/03/2022   10:54 AM  Hemoglobin A1C  Hemoglobin-A1c 4.8 - 5.6 % 6.8    Assessment/Plan: 1. Skin neoplasm   2. Pain due to onychomycosis of toenails of both feet     The mycotic toenails were sharply debrided x10 with sterile nail nippers and a power debriding burr to decrease bulk/thickness and length.    Discussed obtaining a punch biopsy of the lesion at the future visit.  Will go ahead and get her scheduled for the punch biopsy of the plantar skin lesion on the right heel within the next couple of weeks.  Return in about 3 months (around 12/15/2023) for [facility].   Awanda CHARM Imperial, DPM, FACFAS [facility]     [address] [place], [place] [address]                Office [phone]  Fax [phone]

## 2023-09-17 ENCOUNTER — Telehealth (HOSPITAL_BASED_OUTPATIENT_CLINIC_OR_DEPARTMENT_OTHER): Payer: Self-pay

## 2023-09-17 NOTE — Telephone Encounter (Signed)
 Routing to Dr [employer] nurse to see if she has received a fax or if it came through his mail.

## 2023-09-17 NOTE — Telephone Encounter (Signed)
 Copied from CRM #[id number]. Topic: Clinical - Order For Equipment >> Sep 16, 2023  2:39 PM [name] wrote: Reason for CRM: [name] WITH [employer] CALLED ABOUT A FAX FOR PT, STATED PT IS REQUESTING TO CHANGE SUPPLIERS AND THIS WAS FAXED ON 08/19 AND AGAIN TODAY

## 2023-09-18 NOTE — Telephone Encounter (Signed)
 Forms received 09/18/2023 and placed in Dr. Leatha box for signature.  He will be back in office 9/4

## 2023-09-29 NOTE — Telephone Encounter (Signed)
 Cecilia from [employer] called again today to get the status of the forms that were sent to the doctor.  She will await a call to let her know when they can receive the paperwork. Her phone number is [phone].  Thanks.

## 2023-09-29 NOTE — Telephone Encounter (Signed)
 I checked and it appears that Dr Annella has signed everything in his folder. There has not been anything scanned in yet. I am unsure why the fax was not received by [employer] yet, but I called and left a detailed msg fro Caprice, letting her know she can re fax the form if needed. Closing encounter.

## 2023-09-29 NOTE — Telephone Encounter (Signed)
 Copied from CRM #[id number]. Topic: General - Other >> Sep 29, 2023 11:42 AM Lavanda D wrote: Reason for CRM: Cecilia with [employer]  calling back to speak with Sonny, advised on Leslie's message and told her she can re-fax the form if needed. She stated that she has refaxed a letter with a requirement for 3 documents in order for the patient to be processed for oxygen .  I have checked his papers up front and form has not been received yet.  Routing to [place] who is working with Dr. Annella 9/10 to see if form gets faxed and needs signed.

## 2023-09-30 NOTE — Telephone Encounter (Signed)
 Refaxed paper to [employer] fax went through

## 2023-10-05 ENCOUNTER — Encounter: Payer: Self-pay | Admitting: Podiatry

## 2023-10-05 ENCOUNTER — Ambulatory Visit (INDEPENDENT_AMBULATORY_CARE_PROVIDER_SITE_OTHER): Admitting: Podiatry

## 2023-10-05 DIAGNOSIS — D492 Neoplasm of unspecified behavior of bone, soft tissue, and skin: Secondary | ICD-10-CM

## 2023-10-05 NOTE — Progress Notes (Signed)
 Chief Complaint  Patient presents with   Foot Pain    biopsy lesion on plantar right medial plantar heel. 0 pain. NIDDM A1C 6.8   HPI: 73 y.o. female presents today for punch biopsy of a pigmented skin lesion on the plantar aspect of the right heel.  This was discussed in detail at the last appointment.  She opted to proceed with 3 mm punch biopsy today.  Past Medical History:  Diagnosis Date   Anemia    Anxiety    Arthritis    Atrial fib/flutter, transient (HCC)    Cataract    COPD (chronic obstructive pulmonary disease) (HCC)    Depression    Diabetes mellitus    Diverticulosis    DJD (degenerative joint disease)    GERD (gastroesophageal reflux disease)    Gout    History of pneumonia    Hypertension    Hypothyroidism    Internal hemorrhoids    Past Surgical History:  Procedure Laterality Date   ABDOMINAL AORTOGRAM W/LOWER EXTREMITY N/A 07/24/2020   Procedure: ABDOMINAL AORTOGRAM W/LOWER EXTREMITY;  Surgeon: Serene Gaile ORN, MD;  [facility];  Service: Cardiovascular;  Laterality: N/A;   CAROTID STENT  05/2021   CATARACT EXTRACTION W/ INTRAOCULAR LENS IMPLANT Left    ENDARTERECTOMY Left 09/09/2019   Procedure: LEFT ILIAC ENDARTERECTOMY;  Surgeon: Serene Gaile ORN, MD;  Location: [facility];  Service: Vascular;  Laterality: Left;   HERNIA REPAIR     INSERTION OF ILIAC STENT Left 09/09/2019   Procedure: INSERTION OF LEFT ILIAC STENT;  Surgeon: Serene Gaile ORN, MD;  [facility]: [facility];  Service: Vascular;  Laterality: Left;   PACEMAKER INSERTION     PERIPHERAL VASCULAR INTERVENTION Left 07/24/2020   Procedure: PERIPHERAL VASCULAR INTERVENTION;  Surgeon: Serene Gaile ORN, MD;  [facility];  Service: Cardiovascular;  Laterality: Left;   PPM GENERATOR CHANGEOUT N/A 11/06/2022   Procedure: PPM GENERATOR CHANGEOUT;  Surgeon: Waddell Danelle ORN, MD;  [facility];  Service: Cardiovascular;  Laterality: N/A;   THORACIC AORTIC ENDOVASCULAR  STENT GRAFT N/A 09/09/2019   Procedure: THORACIC AORTIC ENDOVASCULAR STENT GRAFT WITH LEFT ILIAC CONDUIT;  Surgeon: Serene Gaile ORN, MD;  [facility]: [facility];  Service: Vascular;  Laterality: N/A;   TUBAL LIGATION     ULTRASOUND GUIDANCE FOR VASCULAR ACCESS Left 09/09/2019   Procedure: ULTRASOUND GUIDANCE FOR VASCULAR ACCESS;  Surgeon: Serene Gaile ORN, MD;  [facility]: [facility];  Service: Vascular;  Laterality: Left;   VAGINAL HYSTERECTOMY     Allergies  Allergen Reactions   Amlodipine Swelling    Started very close to episode of angioedema to ACE, unknown if this added effects or just residual   Lisinopril     angioedema   Losartan  Swelling    Lip swelling    Codeine Phosphate Other (See Comments)    REACTION: UNKNOWN   Morphine  And Codeine Other (See Comments)    Cannot take:Heart problems   Naproxen Nausea And Vomiting   Nsaids Nausea Only   Penicillins Other (See Comments)    UNKNOWN REACTION   Propoxyphene N-Acetaminophen  Other (See Comments)    Makes me gag   Sulfamethoxazole-Trimethoprim Other (See Comments)    Gi upset   Flexeril  [Cyclobenzaprine ] Anxiety and Other (See Comments)    Pt states medication gives her nightmares and insomnia.     Physical Exam: Palpable pedal pulses noted.  There is a circular well-defined darker pigmented (brown), nonraised skin lesion on the plantar aspect  of the right heel.  No surrounding erythema or scaling is noted.  No induration is noted.  No itching is noted.  There is minimal pain on palpation of the lesion.  Epicritic sensation is intact  Assessment/Plan of Care: 1. Skin neoplasm     With the patient's consent, the pigmented skin lesion on the plantar aspect of the right heel was anesthetized with 1% lidocaine  plain.  Following confirmation of anesthesia, and a sterile skin prep to the area, a 3 mm punch biopsy instrument was utilized to create the biopsy through the level of dermis to subcutaneous tissue.  This tissue was then carefully  freed from the foot and placed in a formalin container to be sent to pathology for identification and to rule out any melanoma or other cancerous possibilities.  The remaining wound bed had Lumicain applied for hemostasis.  Once this was obtained, antibiotic ointment and a dry sterile dressing were applied.  She will keep this on for the next 1 to 2 days.  After that, she can remove this and begin dressing with antibiotic ointment and a Band-Aid.  Will have the patient follow-up in approximately 2 weeks for recheck.  For the patient it may take up to 2 weeks to get the pathology report back so we will plan to review at that time.  Patient tolerated the anesthesia and procedure well.   Awanda CHARM Imperial, DPM, FACFAS [facility]     [address] [place], [place] [address]                Office [phone]  Fax [phone]

## 2023-10-13 ENCOUNTER — Other Ambulatory Visit: Payer: Self-pay | Admitting: Podiatry

## 2023-10-13 ENCOUNTER — Ambulatory Visit: Payer: Self-pay | Admitting: Podiatry

## 2023-10-26 NOTE — Progress Notes (Signed)
 Remote PPM Transmission

## 2023-11-05 ENCOUNTER — Ambulatory Visit: Payer: Self-pay | Admitting: Pulmonary Disease

## 2023-11-05 NOTE — Telephone Encounter (Signed)
 FYI Only or Action Required?: FYI only for provider.  <Scheduled for acute visit tomorrow at 9:45 AM with Hunsucker>  Patient is followed in Pulmonology for DOE, last seen on 02/09/2023 by Hunsucker, Kristy SAUNDERS, MD.  Called Nurse Triage reporting Shortness of Breath.  Symptoms began a week ago.  Interventions attempted: Maintenance inhaler and Home oxygen  use.  Symptoms are: unchanged.  Triage Disposition: See Physician Within 24 Hours (overriding See HCP Within 4 Hours (Or PCP Triage))  Patient/caregiver understands and will follow disposition?:   E2C2 Pulmonary Triage - Initial Assessment Questions "Chief Complaint (e.g., cough, sob, wheezing, fever, chills, sweat or additional symptoms) *Go to specific symptom protocol after initial questions. Shortness of breath for the last week. No fever, chills, CP. Endorses chest tightness  "How long have symptoms been present?" Over a week  Have you tested for COVID or Flu? Note: If not, ask patient if a home test can be taken. If so, instruct patient to call back for positive results. No  MEDICINES:   "Have you used any OTC meds to help with symptoms?" No If yes, ask "What medications?"   "Have you used your inhalers/maintenance medication?" Yes If yes, "What medications?" Stioto 2 puffs daily.  If inhaler, ask "How many puffs and how often?" Note: Review instructions on medication in the chart. Stioto 2 puffs daily  OXYGEN : "Do you wear supplemental oxygen ?" Yes If yes, "How many liters are you supposed to use?" 4L  "Do you monitor your oxygen  levels?" Yes If yes, What is your reading (oxygen  level) today? 96% on 4L  What is your usual oxygen  saturation reading?  (Note: Pulmonary O2 sats should be 90% or greater) Mid to high 90s   Copied from CRM #[id number]. Topic: Clinical - Red Word Triage >> Nov 05, 2023  8:33 AM [name] wrote: Red Word that prompted transfer to Nurse Triage: trouble breathing and shortness of  breath. Reason for Disposition  [1] Longstanding difficulty breathing (e.g., CHF, COPD, emphysema) AND [2] WORSE than normal  Answer Assessment - Initial Assessment Questions 1. RESPIRATORY STATUS: Describe your breathing? (e.g., wheezing, shortness of breath, unable to speak, severe coughing)      Patient reports shortness of breath with chest tightness 2. ONSET: When did this breathing problem begin?      Started a week ago 3. PATTERN Does the difficult breathing come and go, or has it been constant since it started?      Comes and goes 4. SEVERITY: How bad is your breathing? (e.g., mild, moderate, severe)      Mild-moderate 6. CARDIAC HISTORY: Do you have any history of heart disease? (e.g., heart attack, angina, bypass surgery, angioplasty)      yes 7. LUNG HISTORY: Do you have any history of lung disease?  (e.g., pulmonary embolus, asthma, emphysema)     yes 12. TRAVEL: Have you traveled out of the country in the last month? (e.g., travel history, exposures)       NO  Protocols used: Breathing Difficulty-A-AH

## 2023-11-06 ENCOUNTER — Ambulatory Visit: Admitting: Pulmonary Disease

## 2023-11-06 ENCOUNTER — Ambulatory Visit: Payer: 59

## 2023-11-06 ENCOUNTER — Ambulatory Visit

## 2023-11-06 ENCOUNTER — Encounter: Payer: Self-pay | Admitting: Pulmonary Disease

## 2023-11-06 VITALS — BP 146/90 | HR 74 | Ht 65.0 in | Wt 170.0 lb

## 2023-11-06 DIAGNOSIS — I495 Sick sinus syndrome: Secondary | ICD-10-CM | POA: Diagnosis not present

## 2023-11-06 DIAGNOSIS — R051 Acute cough: Secondary | ICD-10-CM | POA: Diagnosis not present

## 2023-11-06 MED ORDER — LEVOFLOXACIN 500 MG PO TABS: 500.0000 mg | ORAL_TABLET | Freq: Every day | ORAL | 0 refills | Status: DC

## 2023-11-06 NOTE — Progress Notes (Signed)
 @Patient  ID: [name], female    DOB: 1950/04/24, 73 y.o.   MRN: [id number]  Chief Complaint  Patient presents with   Medical Management of Chronic Issues    Pt states SOB / tightness     Referring provider: Norval Kettle, MD  HPI:   73 y.o. woman whom we are seeing in follow up for evaluation of history of oxygen  use and DOE with an acute visit for increased cough and shortness of breath and chest discomfort.  Multiple cardiology notes reviewed.  Undergoing care with heart failure team as well as electrophysiology.  Currently on Stiolto.  Not sure it helped much.  In terms of dyspnea.  Weight is down about 16 pounds from last visit.  She reports good adherence to cardiac medicines, diuretics.  Chief complaint they have shortness of breath.  With exertion.  Worse over the last week or so.  Oxygen  saturations maintaining on 4 L although dropping to the high 80s from low 90s prior.  Associated with increased congestion and cough.  Left-sided chest discomfort.  Not with exertion.  Unclear pattern on questioning.  HPI at initial visit: Patient was mid to the hospital 06/2020 with severe allergic/near anaphylactic reaction.  Seem to be the antibiotic.  VBG showed hypercarbia PCO2 in the 60s.  Reportedly hypoxemic.  Very wheezy.  Treated with supportive care.  Received antihistamines, epinephrine  injection, steroids.  Gradually improved.  Was discharged on 2 L oxygen  after approximate 24 hours in the hospital.  Relatively fast discharge especially with new oxygen  requirement.  Quickly stopped using at home.  Notes checks her oxygen  saturation.  Lowest this gets as 93% during the day when she is walking around.  She continues 1 L at night.  She does this because she was told to.  No overnight oximetry.  She endorses ongoing smoking of cigarettes.  Has cut down.  Wishes to decrease further.  Cites son's illness in [place]  stressor.  She denies any real breathing issues.  Prior to that  acute illness, no breathing issues.  No respiratory issues.  No dyspnea on exertion.  He is not limited in any way from exertional capacity.  Occasional cough but not bothersome.  Never had PFTs per her report.  Review chest x-ray during admission 06-2020 on my interpretation reveals clear lungs bilaterally.   PMH: Tobacco abuse in remission Surgical history: Hysterectomy, tubal ligation, hernia repair, endarterectomy Family history: Father with prostate cancer, mother with CAD, hypertension, CKD Social history: Current smoker, does not have a pack a day.  50-pack-year smoking history, smoked a pack a day for 50 years before cutting down recently, lives in [place] / Pulmonary Flowsheets:   ACT:      No data to display          MMRC:     No data to display          Epworth:      No data to display          Tests:   FENO:  No results found for: NITRICOXIDE  PFT:    Latest Ref Rng & Units 08/27/2022    2:05 PM  PFT Results  FVC-Pre L 1.89   FVC-Predicted Pre % 61   FVC-Post L 1.76   FVC-Predicted Post % 57   Pre FEV1/FVC % % 75   Post FEV1/FCV % % 79   FEV1-Pre L 1.42   FEV1-Predicted Pre % 61   FEV1-Post L 1.39  DLCO uncorrected ml/min/mmHg 7.36   DLCO UNC% % 36   DLCO corrected ml/min/mmHg 7.36   DLCO COR %Predicted % 36   DLVA Predicted % 46   TLC L 4.46   TLC % Predicted % 85   RV % Predicted % 101   Personally reviewed and interpreted as no fixed obstruction, spirometry suggestive of air trapping versus restriction, lung volume within normal limits.  DLCO severely reduced.  WALK:     02/09/2023    4:16 PM  SIX MIN WALK  Supplimental Oxygen  during Test? (L/min) Yes  O2 Flow Rate 2 L/min  Type Pulse  Tech Comments: Pt was started on 74l O2 and could not maintain O2. Pt was put on 4L O2 by lap 3 and still could not manage 02 and proceeded to be out of breath. Pt does not qualify for POC    Imaging: Personally reviewed and as  per EMR discussion this note  Lab Results: Personally reviewed, elevated eosinophils notably CBC    Component Value Date/Time   WBC 7.9 05/17/2023 1012   RBC 4.25 05/17/2023 1012   HGB 12.1 05/17/2023 1012   HCT 37.9 05/17/2023 1012   PLT 174 05/17/2023 1012   MCV 89.2 05/17/2023 1012   MCH 28.5 05/17/2023 1012   MCHC 31.9 05/17/2023 1012   RDW 21.3 (H) 05/17/2023 1012   LYMPHSABS 1.2 05/17/2023 1012   MONOABS 0.7 05/17/2023 1012   EOSABS 0.3 05/17/2023 1012   BASOSABS 0.0 05/17/2023 1012    BMET    Component Value Date/Time   NA 137 06/18/2023 1029   NA 146 (H) 02/19/2023 1107   K 3.1 (L) 06/18/2023 1029   CL 95 (L) 06/18/2023 1029   CO2 30 06/18/2023 1029   GLUCOSE 163 (H) 06/18/2023 1029   BUN 43 (H) 06/18/2023 1029   BUN 32 (H) 02/19/2023 1107   CREATININE 2.32 (H) 06/18/2023 1029   CALCIUM  8.7 (L) 06/18/2023 1029   GFRNONAA 22 (L) 06/18/2023 1029   GFRAA 48 (L) 09/19/2019 0140    BNP    Component Value Date/Time   BNP 115.9 (H) 06/18/2023 1029    ProBNP No results found for: PROBNP  Specialty Problems       Pulmonary Problems   OBSTRUCTIVE SLEEP APNEA   Qualifier: Diagnosis of  By: Norleen MD, Lynwood ORN       ASTHMATIC BRONCHITIS, ACUTE   Qualifier: Diagnosis of  By: Norleen MD, Lynwood ORN       Hypoxia    Allergies  Allergen Reactions   Amlodipine Swelling    Started very close to episode of angioedema to ACE, unknown if this added effects or just residual   Lisinopril     angioedema   Losartan  Swelling    Lip swelling    Codeine Phosphate Other (See Comments)    REACTION: UNKNOWN   Morphine  And Codeine Other (See Comments)    Cannot take:Heart problems   Naproxen Nausea And Vomiting   Nsaids Nausea Only   Penicillins Other (See Comments)    UNKNOWN REACTION   Propoxyphene N-Acetaminophen  Other (See Comments)    Makes me gag   Sulfamethoxazole-Trimethoprim Other (See Comments)    Gi upset   Flexeril  [Cyclobenzaprine ] Anxiety and  Other (See Comments)    Pt states medication gives her nightmares and insomnia.    Immunization History  Administered Date(s) Administered   H1N1 02/16/2008   Influenza Whole 11/22/2008   PFIZER(Purple Top)SARS-COV-2 Vaccination 06/25/2019, 08/22/2019   PNEUMOCOCCAL CONJUGATE-20 06/25/2022  Pfizer Covid-19 Vaccine Bivalent Booster 109yrs & up 02/24/2020, 09/21/2020   Pneumococcal Polysaccharide-23 05/21/2006    Past Medical History:  Diagnosis Date   Anemia    Anxiety    Arthritis    Atrial fib/flutter, transient (HCC)    Cataract    COPD (chronic obstructive pulmonary disease) (HCC)    Depression    Diabetes mellitus    Diverticulosis    DJD (degenerative joint disease)    GERD (gastroesophageal reflux disease)    Gout    History of pneumonia    Hypertension    Hypothyroidism    Internal hemorrhoids     Tobacco History: Social History   Tobacco Use  Smoking Status Former   Current packs/day: 2.00   Average packs/day: 2.0 packs/day for 54.0 years (108.0 ttl pk-yrs)   Types: Cigarettes   Passive exposure: Never  Smokeless Tobacco Never  Tobacco Comments   Pt quit smoking July 2024   Counseling given: Not Answered Tobacco comments: Pt quit smoking July 2024   Outpatient Encounter Medications as of 11/06/2023  Medication Sig   aspirin  81 MG tablet Take 81 mg by mouth every morning.    carvedilol  (COREG ) 25 MG tablet Take 1 tablet (25 mg total) by mouth 2 (two) times daily with a meal.   Cholecalciferol  (VITAMIN D ) 50 MCG (2000 UT) CAPS Take 2,000 Units by mouth every morning.   cloNIDine  (CATAPRES ) 0.3 MG tablet Take 1 tablet (0.3 mg total) by mouth 3 (three) times daily.   clopidogrel  (PLAVIX ) 75 MG tablet Take 75 mg by mouth daily.   clotrimazole-betamethasone (LOTRISONE) cream Apply 1 Application topically daily as needed (Rash).   diclofenac  Sodium (VOLTAREN  ARTHRITIS PAIN) 1 % GEL Apply 2 g topically 4 (four) times daily.   doxazosin  (CARDURA ) 4 MG tablet  TAKE 1 TABLET BY MOUTH DAILY   famotidine  (PEPCID ) 20 MG tablet Take 20 mg by mouth daily.   furosemide  (LASIX ) 20 MG tablet Take 1 tablet (20 mg total) by mouth daily.   gabapentin  (NEURONTIN ) 100 MG capsule Take 1 capsule (100 mg total) by mouth 3 (three) times daily.   glimepiride  (AMARYL ) 1 MG tablet Take 1 tablet (1 mg total) by mouth every morning.   hydrALAZINE  (APRESOLINE ) 100 MG tablet Take 1 tablet (100 mg total) by mouth 3 (three) times daily.   hydrochlorothiazide  (HYDRODIURIL ) 25 MG tablet Take 1 tablet (25 mg total) by mouth daily.   hydrOXYzine  (ATARAX ) 50 MG tablet Take 1 tablet (50 mg total) by mouth every 8 (eight) hours as needed.   isosorbide  mononitrate (IMDUR ) 60 MG 24 hr tablet TAKE 1 TABLET BY MOUTH DAILY   levofloxacin (LEVAQUIN) 500 MG tablet Take 1 tablet (500 mg total) by mouth daily.   levothyroxine  (SYNTHROID ) 150 MCG tablet Take 150 mcg by mouth See admin instructions. Take   levothyroxine  (SYNTHROID ) 25 MCG tablet Take 1 tablet (25 mcg total) by mouth daily.   lovastatin (MEVACOR) 20 MG tablet Take 20 mg by mouth at bedtime.   montelukast (SINGULAIR) 10 MG tablet Take 10 mg by mouth daily.   Multiple Vitamin (MULTIVITAMIN) tablet Take 1 tablet by mouth every morning.    Multiple Vitamins-Minerals (HAIR SKIN NAILS PO) Take 5,000 mcg by mouth daily.   Oxycodone  HCl 10 MG TABS Take 10 mg by mouth 4 (four) times daily as needed (pain).   OXYGEN  Inhale 3 L into the lungs continuous.   pantoprazole  (PROTONIX ) 40 MG tablet Take 1 tablet (40 mg total) by mouth daily.  polyethylene glycol (MIRALAX  / GLYCOLAX ) packet Take 17 g by mouth daily as needed for mild constipation, moderate constipation or severe constipation.   sertraline  (ZOLOFT ) 100 MG tablet Take 100 mg by mouth at bedtime.   STIOLTO RESPIMAT  2.5-2.5 MCG/ACT AERS INHALE 2 PUFFS INTO THE LUNGS ONCE DAILY   triamcinolone cream (KENALOG) 0.1 % Apply topically 2 (two) times daily as needed (eczema).   No  facility-administered encounter medications on file as of 11/06/2023.     Review of Systems  Review of Systems  N/a Physical Exam  BP (!) 146/90   Pulse 74   Ht 5' 5 (1.651 m)   Wt 170 lb (77.1 kg)   SpO2 94%   PF (!) 4 L/min   BMI 28.29 kg/m   Wt Readings from Last 5 Encounters:  11/06/23 170 lb (77.1 kg)  09/14/23 171 lb (77.6 kg)  07/10/23 180 lb 3.2 oz (81.7 kg)  06/18/23 179 lb 9.6 oz (81.5 kg)  05/17/23 172 lb (78 kg)    BMI Readings from Last 5 Encounters:  11/06/23 28.29 kg/m  09/14/23 28.46 kg/m  07/10/23 29.99 kg/m  06/18/23 29.89 kg/m  05/17/23 28.62 kg/m    Physical Exam General: Well-appearing, no acute distress Eyes: EOMI, icterus Neck: No BP appreciated sitting upright Pulmonary: Distant, rhonchi bilaterally, diminished in the right base Cardiovascular: Regular in rhythm, no murmur, appears euvolemic Abdomen: Nondistended, bowel sounds present MSK: No synovitis, no joint effusion Neuro: Normal gait, no weakness Psych: Normal mood, full affect   Assessment & Plan:   Acute cough, worsening shortness of breath, chest discomfort: Suspicion for bronchitis versus walking pneumonia.  Lungs are junky.  Preceding runny nose, upper respiratory infection, viral infection favored.  Chest x-ray today.  Levofloxacin prescribed in case pneumonia is seen.  Consider prescribing prednisone  next week if not improving.  She has no history of COPD nor significant signs of asthma or air trapping on PFTs.  But it is possible this is underlying diagnosis not yet recognized.  And steroids could be beneficial.  History of severe allergic reaction/anaphylaxis: Presented with hypercarbia and hypoxemia.  Suspect this was acute bronchospasm.  Possible underlying COPD.  Emphysema seen on CT scan.  DOE: Likely multifactorial related to deconditioning as well as smoking-related disease.  Emphysema on CT scan.  More recent decompensations and then hypertensive and congestive  heart failure with volume overload in the lungs.  Stiolto for bronchodilation has helped, she was advised to continue this.  PFTs largely normal with the exception of severe reduced DLCO, likely in the setting of emphysema and intermittent fluid overload, no evidence of ILD on cross-sectional imaging.   Nocturnal hypoxemia: Confirmed on overnight oximetry.  Continue nocturnal oxygen   Chronic hypoxemic respiratory failure: Historically required 2 L pulsed to maintain oxygen  saturations.  Currently using 4 L.  Not a candidate for pulsed oxygen  or POC given inability to maintain adequate oxygen  saturation up with ambulation in the past.  Return in about 6 months (around 05/06/2024) for f/u Dr. Annella.   Donnice JONELLE Annella, MD 11/06/2023

## 2023-11-06 NOTE — Patient Instructions (Signed)
 Nice to see you again  Your lungs do sound a bit junky  Chest x-ray today to evaluate for pneumonia  Take levofloxacin 1 tablet once a day for 7 days, this is a strong antibiotic to treat a potential pneumonia  No changes to medication  Return to clinic in 6 months or sooner as needed with Dr. Annella

## 2023-11-10 LAB — CUP PACEART REMOTE DEVICE CHECK
Battery Remaining Longevity: 139 mo
Battery Voltage: 3.09 V
Brady Statistic AP VP Percent: 3.26 %
Brady Statistic AP VS Percent: 87.74 %
Brady Statistic AS VP Percent: 0.8 %
Brady Statistic AS VS Percent: 8.2 %
Brady Statistic RA Percent Paced: 91.03 %
Brady Statistic RV Percent Paced: 4.08 %
Date Time Interrogation Session: 20251016234332
Implantable Lead Connection Status: 753985
Implantable Lead Connection Status: 753985
Implantable Lead Implant Date: 19970929
Implantable Lead Implant Date: 19970929
Implantable Lead Location: 753859
Implantable Lead Location: 753860
Implantable Lead Model: 4068
Implantable Lead Model: 5092
Implantable Pulse Generator Implant Date: 20241017
Lead Channel Impedance Value: 342 Ohm
Lead Channel Impedance Value: 342 Ohm
Lead Channel Impedance Value: 380 Ohm
Lead Channel Impedance Value: 418 Ohm
Lead Channel Pacing Threshold Amplitude: 0.75 V
Lead Channel Pacing Threshold Amplitude: 1.125 V
Lead Channel Pacing Threshold Pulse Width: 0.4 ms
Lead Channel Pacing Threshold Pulse Width: 0.4 ms
Lead Channel Sensing Intrinsic Amplitude: 1.125 mV
Lead Channel Sensing Intrinsic Amplitude: 1.125 mV
Lead Channel Sensing Intrinsic Amplitude: 10.875 mV
Lead Channel Sensing Intrinsic Amplitude: 10.875 mV
Lead Channel Setting Pacing Amplitude: 2 V
Lead Channel Setting Pacing Amplitude: 2 V
Lead Channel Setting Pacing Pulse Width: 0.4 ms
Lead Channel Setting Sensing Sensitivity: 1.2 mV
Zone Setting Status: 755011

## 2023-11-11 ENCOUNTER — Ambulatory Visit: Payer: Self-pay | Admitting: Internal Medicine

## 2023-11-12 ENCOUNTER — Telehealth: Payer: Self-pay | Admitting: Pulmonary Disease

## 2023-11-12 NOTE — Telephone Encounter (Signed)
 Copied from CRM [id number]. Topic: Clinical - Order For Equipment >> Nov 12, 2023  2:36 PM Celestine FALCON wrote: Reason for CRM: CECILIA calling from [facility]  regarding a previous POC order from September. She stated it was received by [facility] stating the pt didn't qualify due to the walk test.  She wanted to know if the pt still wanted to try to obtain a POC, and if so, when the pt would be scheduled for the walk test.  Pt's phone number is [phone].

## 2023-11-12 NOTE — Progress Notes (Signed)
 Remote PPM Transmission

## 2023-11-17 NOTE — Telephone Encounter (Signed)
 I called and spoke with the patient, she stated she was feeling much better.  She said she had pneumonia when she tried to qualify for the POC previously.  I let her know that she can always walk again to see if she can keep her oxygen  saturations up when she comes back in November.  She said she would like to try to qualify when she comes back in November.  Nothing further needed.

## 2023-12-01 ENCOUNTER — Ambulatory Visit: Admitting: Pulmonary Disease

## 2023-12-03 ENCOUNTER — Ambulatory Visit: Admitting: Pulmonary Disease

## 2023-12-03 ENCOUNTER — Encounter: Payer: Self-pay | Admitting: Pulmonary Disease

## 2023-12-03 VITALS — BP 148/85 | HR 72 | Ht 65.0 in | Wt 185.8 lb

## 2023-12-03 DIAGNOSIS — R0902 Hypoxemia: Secondary | ICD-10-CM

## 2023-12-03 MED ORDER — STIOLTO RESPIMAT 2.5-2.5 MCG/ACT IN AERS: 2.0000 | INHALATION_SPRAY | Freq: Every day | RESPIRATORY_TRACT | 11 refills | Status: AC

## 2023-12-03 NOTE — Patient Instructions (Signed)
 Nice to see you again  I refilled the Stiolto inhaler, no changes to medicines  Your lungs are much clearer, I think your pneumonia is gotten better  Contact Dr. Russ office the vascular surgeon about the leg pain, I worry about poor circulation in the arteries causing pain when you walk around.  Return to clinic in 6 months or sooner as needed

## 2023-12-03 NOTE — Progress Notes (Signed)
 @Patient  ID: [name], female    DOB: [birth year], 73 y.o.   MRN: [id number]  Chief Complaint  Patient presents with   Medical Management of Chronic Issues    Pt states Monday / Tuesday not feeling nose ,eyes running      Referring provider: Norval Kettle, MD  HPI:   73 y.o. woman whom we are seeing in follow up after recent pneumonia.  Multiple telephone encounter notes interim since last visit reviewed.  Doing ok. At last visit concern for pna so given abx. CXR looked wet on my review and interpretation. She improved, less cough, congestion, DOE. Doing better.   HPI at initial visit: Patient was mid to the hospital 06/2020 with severe allergic/near anaphylactic reaction.  Seem to be the antibiotic.  VBG showed hypercarbia PCO2 in the 60s.  Reportedly hypoxemic.  Very wheezy.  Treated with supportive care.  Received antihistamines, epinephrine  injection, steroids.  Gradually improved.  Was discharged on 2 L oxygen  after approximate 24 hours in the hospital.  Relatively fast discharge especially with new oxygen  requirement.  Quickly stopped using at home.  Notes checks her oxygen  saturation.  Lowest this gets as 93% during the day when she is walking around.  She continues 1 L at night.  She does this because she was told to.  No overnight oximetry.  She endorses ongoing smoking of cigarettes.  Has cut down.  Wishes to decrease further.  Cites son's illness in [place]  stressor.  She denies any real breathing issues.  Prior to that acute illness, no breathing issues.  No respiratory issues.  No dyspnea on exertion.  He is not limited in any way from exertional capacity.  Occasional cough but not bothersome.  Never had PFTs per her report.  Review chest x-ray during admission 06-2020 on my interpretation reveals clear lungs bilaterally.   PMH: Tobacco abuse in remission Surgical history: Hysterectomy, tubal ligation, hernia repair, endarterectomy Family history: Father with  prostate cancer, mother with CAD, hypertension, CKD Social history: Current smoker, does not have a pack a day.  50-pack-year smoking history, smoked a pack a day for 50 years before cutting down recently, lives in [place] / Pulmonary Flowsheets:   ACT:      No data to display          MMRC:     No data to display          Epworth:      No data to display          Tests:   FENO:  No results found for: NITRICOXIDE  PFT:    Latest Ref Rng & Units 08/27/2022    2:05 PM  PFT Results  FVC-Pre L 1.89   FVC-Predicted Pre % 61   FVC-Post L 1.76   FVC-Predicted Post % 57   Pre FEV1/FVC % % 75   Post FEV1/FCV % % 79   FEV1-Pre L 1.42   FEV1-Predicted Pre % 61   FEV1-Post L 1.39   DLCO uncorrected ml/min/mmHg 7.36   DLCO UNC% % 36   DLCO corrected ml/min/mmHg 7.36   DLCO COR %Predicted % 36   DLVA Predicted % 46   TLC L 4.46   TLC % Predicted % 85   RV % Predicted % 101   Personally reviewed and interpreted as no fixed obstruction, spirometry suggestive of air trapping versus restriction, lung volume within normal limits.  DLCO severely reduced.  WALK:  02/09/2023    4:16 PM  SIX MIN WALK  Supplimental Oxygen  during Test? (L/min) Yes  O2 Flow Rate 2 L/min  Type Pulse  Tech Comments: Pt was started on 2l O2 and could not maintain O2. Pt was put on 4L O2 by lap 3 and still could not manage 02 and proceeded to be out of breath. Pt does not qualify for POC    Imaging: Personally reviewed and as per EMR discussion this note  Lab Results: Personally reviewed, elevated eosinophils notably CBC    Component Value Date/Time   WBC 7.9 05/17/2023 1012   RBC 4.25 05/17/2023 1012   HGB 12.1 05/17/2023 1012   HCT 37.9 05/17/2023 1012   PLT 174 05/17/2023 1012   MCV 89.2 05/17/2023 1012   MCH 28.5 05/17/2023 1012   MCHC 31.9 05/17/2023 1012   RDW 21.3 (H) 05/17/2023 1012   LYMPHSABS 1.2 05/17/2023 1012   MONOABS 0.7 05/17/2023 1012    EOSABS 0.3 05/17/2023 1012   BASOSABS 0.0 05/17/2023 1012    BMET    Component Value Date/Time   NA 137 06/18/2023 1029   NA 146 (H) 02/19/2023 1107   K 3.1 (L) 06/18/2023 1029   CL 95 (L) 06/18/2023 1029   CO2 30 06/18/2023 1029   GLUCOSE 163 (H) 06/18/2023 1029   BUN 43 (H) 06/18/2023 1029   BUN 32 (H) 02/19/2023 1107   CREATININE 2.32 (H) 06/18/2023 1029   CALCIUM  8.7 (L) 06/18/2023 1029   GFRNONAA 22 (L) 06/18/2023 1029   GFRAA 48 (L) 09/19/2019 0140    BNP    Component Value Date/Time   BNP 115.9 (H) 06/18/2023 1029    ProBNP No results found for: PROBNP  Specialty Problems       Pulmonary Problems   OBSTRUCTIVE SLEEP APNEA   Qualifier: Diagnosis of  By: Norleen MD, Lynwood ORN       ASTHMATIC BRONCHITIS, ACUTE   Qualifier: Diagnosis of  By: Norleen MD, Lynwood ORN       Hypoxia    Allergies  Allergen Reactions   Amlodipine Swelling    Started very close to episode of angioedema to ACE, unknown if this added effects or just residual   Lisinopril     angioedema   Losartan  Swelling    Lip swelling    Codeine Phosphate Other (See Comments)    REACTION: UNKNOWN   Morphine  And Codeine Other (See Comments)    Cannot take:Heart problems   Naproxen Nausea And Vomiting   Nsaids Nausea Only   Penicillins Other (See Comments)    UNKNOWN REACTION   Propoxyphene N-Acetaminophen  Other (See Comments)    Makes me gag   Sulfamethoxazole-Trimethoprim Other (See Comments)    Gi upset   Flexeril  [Cyclobenzaprine ] Anxiety and Other (See Comments)    Pt states medication gives her nightmares and insomnia.    Immunization History  Administered Date(s) Administered   H1N1 02/16/2008   Influenza Whole 11/22/2008   PFIZER(Purple Top)SARS-COV-2 Vaccination 06/25/2019, 08/22/2019   PNEUMOCOCCAL CONJUGATE-20 06/25/2022   Pfizer Covid-19 Vaccine Bivalent Booster 56yrs & up 02/24/2020, 09/21/2020   Pneumococcal Polysaccharide-23 05/21/2006    Past Medical History:   Diagnosis Date   Anemia    Anxiety    Arthritis    Atrial fib/flutter, transient (HCC)    Cataract    COPD (chronic obstructive pulmonary disease) (HCC)    Depression    Diabetes mellitus    Diverticulosis    DJD (degenerative joint disease)  GERD (gastroesophageal reflux disease)    Gout    History of pneumonia    Hypertension    Hypothyroidism    Internal hemorrhoids     Tobacco History: Social History   Tobacco Use  Smoking Status Former   Current packs/day: 2.00   Average packs/day: 2.0 packs/day for 54.0 years (108.0 ttl pk-yrs)   Types: Cigarettes   Passive exposure: Never  Smokeless Tobacco Never  Tobacco Comments   Pt quit smoking July 2024   Counseling given: Not Answered Tobacco comments: Pt quit smoking July 2024   Outpatient Encounter Medications as of 12/03/2023  Medication Sig   aspirin  81 MG tablet Take 81 mg by mouth every morning.    carvedilol  (COREG ) 25 MG tablet Take 1 tablet (25 mg total) by mouth 2 (two) times daily with a meal.   Cholecalciferol  (VITAMIN D ) 50 MCG (2000 UT) CAPS Take 2,000 Units by mouth every morning.   cloNIDine  (CATAPRES ) 0.3 MG tablet Take 1 tablet (0.3 mg total) by mouth 3 (three) times daily.   clopidogrel  (PLAVIX ) 75 MG tablet Take 75 mg by mouth daily.   clotrimazole-betamethasone (LOTRISONE) cream Apply 1 Application topically daily as needed (Rash).   diclofenac  Sodium (VOLTAREN  ARTHRITIS PAIN) 1 % GEL Apply 2 g topically 4 (four) times daily.   doxazosin  (CARDURA ) 4 MG tablet TAKE 1 TABLET BY MOUTH DAILY   famotidine  (PEPCID ) 20 MG tablet Take 20 mg by mouth daily.   furosemide  (LASIX ) 20 MG tablet Take 1 tablet (20 mg total) by mouth daily.   gabapentin  (NEURONTIN ) 100 MG capsule Take 1 capsule (100 mg total) by mouth 3 (three) times daily.   glimepiride  (AMARYL ) 1 MG tablet Take 1 tablet (1 mg total) by mouth every morning.   hydrALAZINE  (APRESOLINE ) 100 MG tablet Take 1 tablet (100 mg total) by mouth 3 (three)  times daily.   hydrochlorothiazide  (HYDRODIURIL ) 25 MG tablet Take 1 tablet (25 mg total) by mouth daily.   hydrOXYzine  (ATARAX ) 50 MG tablet Take 1 tablet (50 mg total) by mouth every 8 (eight) hours as needed.   isosorbide  mononitrate (IMDUR ) 60 MG 24 hr tablet TAKE 1 TABLET BY MOUTH DAILY   levothyroxine  (SYNTHROID ) 150 MCG tablet Take 150 mcg by mouth See admin instructions. Take   levothyroxine  (SYNTHROID ) 25 MCG tablet Take 1 tablet (25 mcg total) by mouth daily.   lovastatin (MEVACOR) 20 MG tablet Take 20 mg by mouth at bedtime.   montelukast (SINGULAIR) 10 MG tablet Take 10 mg by mouth daily.   Multiple Vitamin (MULTIVITAMIN) tablet Take 1 tablet by mouth every morning.    Multiple Vitamins-Minerals (HAIR SKIN NAILS PO) Take 5,000 mcg by mouth daily.   Oxycodone  HCl 10 MG TABS Take 10 mg by mouth 4 (four) times daily as needed (pain).   OXYGEN  Inhale 3 L into the lungs continuous.   pantoprazole  (PROTONIX ) 40 MG tablet Take 1 tablet (40 mg total) by mouth daily.   polyethylene glycol (MIRALAX  / GLYCOLAX ) packet Take 17 g by mouth daily as needed for mild constipation, moderate constipation or severe constipation.   sertraline  (ZOLOFT ) 100 MG tablet Take 100 mg by mouth at bedtime.   triamcinolone cream (KENALOG) 0.1 % Apply topically 2 (two) times daily as needed (eczema).   [DISCONTINUED] levofloxacin (LEVAQUIN) 500 MG tablet Take 1 tablet (500 mg total) by mouth daily.   [DISCONTINUED] STIOLTO RESPIMAT  2.5-2.5 MCG/ACT AERS INHALE 2 PUFFS INTO THE LUNGS ONCE DAILY   Tiotropium Bromide-Olodaterol (STIOLTO RESPIMAT ) 2.5-2.5  MCG/ACT AERS Inhale 2 puffs into the lungs daily.   No facility-administered encounter medications on file as of 12/03/2023.     Review of Systems  Review of Systems  N/a Physical Exam  BP (!) 148/85   Pulse 72   Ht 5' 5 (1.651 m) Comment: per pt  Wt 185 lb 12.8 oz (84.3 kg)   SpO2 100%   BMI 30.92 kg/m   Wt Readings from Last 5 Encounters:  12/03/23  185 lb 12.8 oz (84.3 kg)  11/06/23 170 lb (77.1 kg)  09/14/23 171 lb (77.6 kg)  07/10/23 180 lb 3.2 oz (81.7 kg)  06/18/23 179 lb 9.6 oz (81.5 kg)    BMI Readings from Last 5 Encounters:  12/03/23 30.92 kg/m  11/06/23 28.29 kg/m  09/14/23 28.46 kg/m  07/10/23 29.99 kg/m  06/18/23 29.89 kg/m    Physical Exam General: Sitting in wheelchair, in no distress Eyes: No icterus Pulmonary: Distant, clear, improved from prior Cardiovascular: Regular rhythm and rate none Abdomen: Nondistended   Assessment & Plan:   Pneumonia: Acute cough worsening shortness of breath chest discomfort.  Chest x-ray with initial infiltrates could be edema.  Improved with antibiotics.  Lungs are clear today.  DOE: Likely multifactorial related to deconditioning as well as smoking-related disease.  Emphysema on CT scan.  More recent decompensations and then hypertensive and congestive heart failure with volume overload in the lungs.  Stiolto for bronchodilation has helped, she was advised to continue this.  PFTs largely normal with the exception of severe reduced DLCO, likely in the setting of emphysema and intermittent fluid overload, no evidence of ILD on cross-sectional imaging.   Nocturnal hypoxemia: Confirmed on overnight oximetry.  Continue nocturnal oxygen   Chronic hypoxemic respiratory failure: Continue oxygen  between 2 and 4 L historically.  Return in about 6 months (around 06/01/2024) for f/u Dr. Annella.   Kristy JONELLE Annella, MD 12/03/2023

## 2023-12-10 NOTE — Telephone Encounter (Signed)
 Copied from CRM [id number]. Topic: Clinical - Lab/Test Results >> Dec 08, 2023  2:01 PM Corean SAUNDERS wrote: Reason for CRM: Ted from [employer] called seeking an update on patients walk test as the walk test from January was invalid but per patients chart there has been no update and Cecelia would like to know if Dr. Annella will still be ordering this for the patient.   Please call [phone]   I called and spoke with Ted, I advised her that the patient did not qualify for daytime oxygen .  She only uses oxygen  at HS.  She verbalized understanding.  Nothing further needed.

## 2023-12-21 ENCOUNTER — Ambulatory Visit: Admitting: Podiatry

## 2023-12-22 ENCOUNTER — Telehealth: Payer: Self-pay | Admitting: *Deleted

## 2023-12-22 NOTE — Telephone Encounter (Signed)
 Copied from CRM [id number]. Topic: Clinical - Order For Equipment >> Nov 12, 2023  2:36 PM Celestine FALCON wrote: Reason for CRM: CECILIA calling from [facility]  regarding a previous POC order from September. She stated it was received by [facility] stating the pt didn't qualify due to the walk test.  She wanted to know if the pt still wanted to try to obtain a POC, and if so, when the pt would be scheduled for the walk test.  Pt's phone number is [phone]. >> Dec 22, 2023  3:26 PM Rozanna MATSU wrote: CECILIA calling from [facility] about order and asking about walk test. Per Cecila looking at Dr Annella notes from the walk test  stated the pt does not qualify based on results from the January 2025 test. Ted stated pt will need a new walk test if she is requesting the oxygen .   ATC patient x1.  No answer.  Left detailed message per DPR to clarify if patient is still trying to get a POC.  She was last seen in the office on 11/13.  Will await return call from patient.

## 2023-12-22 NOTE — Telephone Encounter (Signed)
 Copied from CRM [id number]. Topic: Clinical - Order For Equipment >> Nov 12, 2023  2:36 PM Celestine FALCON wrote: Reason for CRM: CECILIA calling from [facility]  regarding a previous POC order from September. She stated it was received by [facility] stating the pt didn't qualify due to the walk test.  She wanted to know if the pt still wanted to try to obtain a POC, and if so, when the pt would be scheduled for the walk test.  Pt's phone number is [phone]. >> Dec 22, 2023  4:45 PM Rozanna G wrote: Pt returning Heathers call adivsed no answer and she will callher tomorrow >> Dec 22, 2023  3:26 PM Rozanna MATSU wrote: CECILIA calling from [facility] about order and asking about walk test. Per Cecila looking at Dr Annella notes from the walk test  stated the pt does not qualify based on results from the January 2025 test. Ted stated pt will need a new walk test if she is requesting the oxygen .    ATC patient x1.  No answer.  Left detailed message per DPR to clarify if patient is still trying to get a POC.  She was last seen in the office on 11/13.  Will await return call from patient.

## 2024-01-19 NOTE — Telephone Encounter (Signed)
 No call back since 12/22/23 msg was left. Closing encounter.

## 2024-02-05 ENCOUNTER — Ambulatory Visit: Payer: 59

## 2024-02-05 DIAGNOSIS — I495 Sick sinus syndrome: Secondary | ICD-10-CM

## 2024-02-08 LAB — CUP PACEART REMOTE DEVICE CHECK
Battery Remaining Longevity: 123 mo
Battery Voltage: 3.05 V
Brady Statistic AP VP Percent: 3.22 %
Brady Statistic AP VS Percent: 85.69 %
Brady Statistic AS VP Percent: 0.73 %
Brady Statistic AS VS Percent: 10.36 %
Brady Statistic RA Percent Paced: 89.04 %
Brady Statistic RV Percent Paced: 3.96 %
Date Time Interrogation Session: 20260115194534
Implantable Lead Connection Status: 753985
Implantable Lead Connection Status: 753985
Implantable Lead Implant Date: 19970929
Implantable Lead Implant Date: 19970929
Implantable Lead Location: 753859
Implantable Lead Location: 753860
Implantable Lead Model: 4068
Implantable Lead Model: 5092
Implantable Pulse Generator Implant Date: 20241017
Lead Channel Impedance Value: 342 Ohm
Lead Channel Impedance Value: 342 Ohm
Lead Channel Impedance Value: 361 Ohm
Lead Channel Impedance Value: 418 Ohm
Lead Channel Pacing Threshold Amplitude: 1 V
Lead Channel Pacing Threshold Amplitude: 1.375 V
Lead Channel Pacing Threshold Pulse Width: 0.4 ms
Lead Channel Pacing Threshold Pulse Width: 0.4 ms
Lead Channel Sensing Intrinsic Amplitude: 0.25 mV
Lead Channel Sensing Intrinsic Amplitude: 0.25 mV
Lead Channel Sensing Intrinsic Amplitude: 9.125 mV
Lead Channel Sensing Intrinsic Amplitude: 9.125 mV
Lead Channel Setting Pacing Amplitude: 2.25 V
Lead Channel Setting Pacing Amplitude: 2.75 V
Lead Channel Setting Pacing Pulse Width: 0.4 ms
Lead Channel Setting Sensing Sensitivity: 1.2 mV
Zone Setting Status: 755011

## 2024-02-11 NOTE — Progress Notes (Signed)
 Remote PPM Transmission

## 2024-02-21 ENCOUNTER — Ambulatory Visit: Payer: Self-pay | Admitting: Student in an Organized Health Care Education/Training Program
